# Patient Record
Sex: Female | Born: 1960
Health system: Southern US, Community
[De-identification: ages and names within clinical notes are randomized; demographics above are authoritative.]

## PROBLEM LIST (undated history)

## (undated) DIAGNOSIS — D509 Iron deficiency anemia, unspecified: Secondary | ICD-10-CM

## (undated) DIAGNOSIS — N200 Calculus of kidney: Secondary | ICD-10-CM

## (undated) DIAGNOSIS — G43909 Migraine, unspecified, not intractable, without status migrainosus: Secondary | ICD-10-CM

## (undated) DIAGNOSIS — G970 Cerebrospinal fluid leak from spinal puncture: Secondary | ICD-10-CM

## (undated) DIAGNOSIS — E039 Hypothyroidism, unspecified: Secondary | ICD-10-CM

## (undated) DIAGNOSIS — K2970 Gastritis, unspecified, without bleeding: Secondary | ICD-10-CM

## (undated) DIAGNOSIS — F329 Major depressive disorder, single episode, unspecified: Secondary | ICD-10-CM

## (undated) DIAGNOSIS — E538 Deficiency of other specified B group vitamins: Secondary | ICD-10-CM

## (undated) DIAGNOSIS — F32A Depression, unspecified: Secondary | ICD-10-CM

## (undated) DIAGNOSIS — M797 Fibromyalgia: Secondary | ICD-10-CM

## (undated) DIAGNOSIS — K2990 Gastroduodenitis, unspecified, without bleeding: Secondary | ICD-10-CM

## (undated) DIAGNOSIS — K219 Gastro-esophageal reflux disease without esophagitis: Secondary | ICD-10-CM

## (undated) HISTORY — DX: Gastroduodenitis, unspecified, without bleeding: K29.90

## (undated) HISTORY — DX: Gastro-esophageal reflux disease without esophagitis: K21.9

## (undated) HISTORY — DX: Cerebrospinal fluid leak from spinal puncture: G97.0

## (undated) HISTORY — DX: Iron deficiency anemia, unspecified: D50.9

## (undated) HISTORY — DX: Hypothyroidism, unspecified: E03.9

## (undated) HISTORY — DX: Migraine, unspecified, not intractable, without status migrainosus: G43.909

## (undated) HISTORY — DX: Deficiency of other specified B group vitamins: E53.8

## (undated) HISTORY — DX: Gastritis, unspecified, without bleeding: K29.70

---
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
## 1981-07-21 HISTORY — PX: THYROID SURGERY: SHX805

## 1987-07-22 HISTORY — PX: TUBAL LIGATION: SHX77

## 1997-07-21 DIAGNOSIS — M797 Fibromyalgia: Secondary | ICD-10-CM

## 1997-07-21 HISTORY — DX: Fibromyalgia: M79.7

## 1998-07-21 HISTORY — PX: TOTAL THYROIDECTOMY: SHX2547

## 1998-09-10 ENCOUNTER — Ambulatory Visit (HOSPITAL_COMMUNITY): Admission: RE | Admit: 1998-09-10 | Discharge: 1998-09-10 | Payer: Self-pay | Admitting: Hematology and Oncology

## 2006-07-30 ENCOUNTER — Ambulatory Visit: Payer: Self-pay | Admitting: Cardiology

## 2006-11-24 ENCOUNTER — Encounter: Admission: RE | Admit: 2006-11-24 | Discharge: 2006-11-24 | Payer: Self-pay | Admitting: Unknown Physician Specialty

## 2007-07-22 HISTORY — PX: CHOLECYSTECTOMY: SHX55

## 2007-07-22 LAB — HM MAMMOGRAPHY

## 2007-11-29 ENCOUNTER — Emergency Department (HOSPITAL_COMMUNITY): Admission: EM | Admit: 2007-11-29 | Discharge: 2007-11-30 | Payer: Self-pay | Admitting: Emergency Medicine

## 2008-07-21 LAB — CONVERTED CEMR LAB: Pap Smear: NEGATIVE

## 2009-05-12 ENCOUNTER — Encounter: Payer: Self-pay | Admitting: Gastroenterology

## 2009-06-12 ENCOUNTER — Encounter: Admission: RE | Admit: 2009-06-12 | Discharge: 2009-06-12 | Payer: Self-pay | Admitting: Specialist

## 2009-06-12 ENCOUNTER — Encounter: Payer: Self-pay | Admitting: Internal Medicine

## 2009-06-19 ENCOUNTER — Encounter: Payer: Self-pay | Admitting: Internal Medicine

## 2009-06-19 LAB — CONVERTED CEMR LAB
Basophils Relative: 0 %
Eosinophils Relative: 2 %
HCT: 33.7 %
Hemoglobin: 11.1 g/dL
Lymphocytes, automated: 40 %
MCV: 93 fL
Monocytes Relative: 14 %
Neutrophils Relative %: 44 %
RBC: 3.64 M/uL
RDW: 12.4 %
TSH: 4.23 microintl units/mL
WBC: 6.2 10*3/uL

## 2009-10-03 ENCOUNTER — Ambulatory Visit: Payer: Self-pay | Admitting: Internal Medicine

## 2009-10-03 DIAGNOSIS — G4726 Circadian rhythm sleep disorder, shift work type: Secondary | ICD-10-CM | POA: Insufficient documentation

## 2009-10-03 DIAGNOSIS — G43909 Migraine, unspecified, not intractable, without status migrainosus: Secondary | ICD-10-CM | POA: Insufficient documentation

## 2009-10-03 DIAGNOSIS — E039 Hypothyroidism, unspecified: Secondary | ICD-10-CM | POA: Insufficient documentation

## 2009-10-03 DIAGNOSIS — D649 Anemia, unspecified: Secondary | ICD-10-CM | POA: Insufficient documentation

## 2009-10-04 ENCOUNTER — Observation Stay (HOSPITAL_COMMUNITY): Admission: EM | Admit: 2009-10-04 | Discharge: 2009-10-07 | Payer: Self-pay | Admitting: Emergency Medicine

## 2009-10-04 ENCOUNTER — Encounter (INDEPENDENT_AMBULATORY_CARE_PROVIDER_SITE_OTHER): Payer: Self-pay | Admitting: *Deleted

## 2009-10-04 DIAGNOSIS — E538 Deficiency of other specified B group vitamins: Secondary | ICD-10-CM | POA: Insufficient documentation

## 2009-10-04 LAB — CONVERTED CEMR LAB
Basophils Absolute: 0.1 10*3/uL (ref 0.0–0.1)
Basophils Relative: 1 % (ref 0.0–3.0)
Cholesterol: 178 mg/dL (ref 0–200)
Eosinophils Absolute: 0.1 10*3/uL (ref 0.0–0.7)
Eosinophils Relative: 1.2 % (ref 0.0–5.0)
Ferritin: 45.6 ng/mL (ref 10.0–291.0)
Folate: 12.7 ng/mL
HCT: 37.6 % (ref 36.0–46.0)
HDL: 70.5 mg/dL (ref >39.00)
Hemoglobin: 12.4 g/dL (ref 12.0–15.0)
Iron: 49 ug/dL (ref 42–145)
LDL Cholesterol: 93 mg/dL (ref 0–99)
Lymphocytes Relative: 29.7 % (ref 12.0–46.0)
Lymphs Abs: 2.1 10*3/uL (ref 0.7–4.0)
MCHC: 32.9 g/dL (ref 30.0–36.0)
MCV: 94.2 fL (ref 78.0–100.0)
Monocytes Absolute: 0.8 10*3/uL (ref 0.1–1.0)
Monocytes Relative: 11.2 % (ref 3.0–12.0)
Neutro Abs: 4 10*3/uL (ref 1.4–7.7)
Neutrophils Relative %: 56.9 % (ref 43.0–77.0)
Platelets: 182 10*3/uL (ref 150.0–400.0)
RBC: 3.99 M/uL (ref 3.87–5.11)
RDW: 12.4 % (ref 11.5–14.6)
TSH: 2.09 microintl units/mL (ref 0.35–5.50)
Total CHOL/HDL Ratio: 3
Triglycerides: 71 mg/dL (ref 0.0–149.0)
VLDL: 14.2 mg/dL (ref 0.0–40.0)
Vitamin B-12: 209 pg/mL — ABNORMAL LOW (ref 211–911)
WBC: 7.1 10*3/uL (ref 4.5–10.5)

## 2009-10-05 LAB — CONVERTED CEMR LAB
ALT: 65 units/L
AST: 89 units/L
Albumin: 2.6 g/dL
Alkaline Phosphatase: 116 units/L
BUN: 5 mg/dL
Basophils Relative: 0 %
CO2: 20 meq/L
Chloride: 115 meq/L
Creatinine, Ser: 0.78 mg/dL
Eosinophils Relative: 0 %
Glucose, Bld: 93 mg/dL
HCT: 27.8 %
Hemoglobin: 9.8 g/dL
Lymphocytes, automated: 33 %
MCV: 89.5 fL
Monocytes Relative: 12 %
Neutrophils Relative %: 54 %
Platelets: 116 10*3/uL
Potassium: 3.3 meq/L
RBC: 3.1 M/uL
RDW: 13.4 %
Sodium: 137 meq/L
Total Bilirubin: 0.2 mg/dL
Total Protein: 5.4 g/dL
WBC: 3.5 10*3/uL

## 2009-10-06 LAB — CONVERTED CEMR LAB
HCT: 28.8 %
Hemoglobin: 10.1 g/dL
Lymphocytes, automated: 44 %
MCV: 89.1 fL
Monocytes Relative: 16 %
Neutrophils Relative %: 39 %
Platelets: 125 10*3/uL
RBC: 3.23 M/uL
RDW: 13.3 %
WBC: 3.8 10*3/uL

## 2009-10-07 LAB — CONVERTED CEMR LAB
BUN: 1 mg/dL
BUN: 4 mg/dL
Basophils Relative: 0 %
CO2: 21 meq/L
CO2: 23 meq/L
Calcium: 8.7 mg/dL
Calcium: 8.9 mg/dL
Chloride: 111 meq/L
Chloride: 116 meq/L
Creatinine, Ser: 0.72 mg/dL
Creatinine, Ser: 0.73 mg/dL
Eosinophils Relative: 2 %
Glucose, Bld: 103 mg/dL
Glucose, Bld: 95 mg/dL
HCT: 31.3 %
Hemoglobin: 10.9 g/dL
Lymphocytes, automated: 35 %
MCV: 89.2 fL
Monocytes Relative: 14 %
Neutrophils Relative %: 48 %
Platelets: 150 10*3/uL
Potassium: 3.7 meq/L
Potassium: 3.9 meq/L
RBC: 3.51 M/uL
RDW: 13.1 %
Sodium: 138 meq/L
Sodium: 140 meq/L
WBC: 5.2 10*3/uL

## 2009-10-09 ENCOUNTER — Encounter: Payer: Self-pay | Admitting: Internal Medicine

## 2009-10-10 ENCOUNTER — Ambulatory Visit: Payer: Self-pay | Admitting: Internal Medicine

## 2009-10-10 ENCOUNTER — Encounter (INDEPENDENT_AMBULATORY_CARE_PROVIDER_SITE_OTHER): Payer: Self-pay | Admitting: *Deleted

## 2009-10-10 DIAGNOSIS — K922 Gastrointestinal hemorrhage, unspecified: Secondary | ICD-10-CM | POA: Insufficient documentation

## 2009-10-10 DIAGNOSIS — A059 Bacterial foodborne intoxication, unspecified: Secondary | ICD-10-CM | POA: Insufficient documentation

## 2009-10-10 LAB — CONVERTED CEMR LAB: Hemoglobin: 13 g/dL (ref 12.0–15.0)

## 2009-10-23 LAB — HM COLONOSCOPY

## 2009-11-01 ENCOUNTER — Encounter (INDEPENDENT_AMBULATORY_CARE_PROVIDER_SITE_OTHER): Payer: Self-pay | Admitting: *Deleted

## 2009-11-01 ENCOUNTER — Ambulatory Visit: Payer: Self-pay | Admitting: Gastroenterology

## 2009-11-01 DIAGNOSIS — R197 Diarrhea, unspecified: Secondary | ICD-10-CM | POA: Insufficient documentation

## 2009-11-01 DIAGNOSIS — D509 Iron deficiency anemia, unspecified: Secondary | ICD-10-CM | POA: Insufficient documentation

## 2009-11-01 DIAGNOSIS — R630 Anorexia: Secondary | ICD-10-CM | POA: Insufficient documentation

## 2009-11-01 LAB — CONVERTED CEMR LAB
ALT: 17 units/L (ref 0–35)
AST: 22 units/L (ref 0–37)
Albumin: 4.5 g/dL (ref 3.5–5.2)
Alkaline Phosphatase: 131 units/L — ABNORMAL HIGH (ref 39–117)
BUN: 6 mg/dL (ref 6–23)
Basophils Absolute: 0 10*3/uL (ref 0.0–0.1)
Basophils Relative: 0.5 % (ref 0.0–3.0)
Bilirubin, Direct: 0.1 mg/dL (ref 0.0–0.3)
CO2: 27 meq/L (ref 19–32)
Calcium: 9.9 mg/dL (ref 8.4–10.5)
Chloride: 105 meq/L (ref 96–112)
Creatinine, Ser: 0.7 mg/dL (ref 0.4–1.2)
Eosinophils Absolute: 0.1 10*3/uL (ref 0.0–0.7)
Eosinophils Relative: 1.4 % (ref 0.0–5.0)
Ferritin: 59.2 ng/mL (ref 10.0–291.0)
Folate: 19.1 ng/mL
GFR calc non Af Amer: 95.1 mL/min (ref >60)
Glucose, Bld: 97 mg/dL (ref 70–99)
HCT: 34.5 % — ABNORMAL LOW (ref 36.0–46.0)
Hemoglobin: 12.1 g/dL (ref 12.0–15.0)
IgA: 311 mg/dL (ref 68–378)
Iron: 105 ug/dL (ref 42–145)
Lymphocytes Relative: 29.5 % (ref 12.0–46.0)
Lymphs Abs: 1.6 10*3/uL (ref 0.7–4.0)
MCHC: 34.9 g/dL (ref 30.0–36.0)
MCV: 90.2 fL (ref 78.0–100.0)
Monocytes Absolute: 0.7 10*3/uL (ref 0.1–1.0)
Monocytes Relative: 13.1 % — ABNORMAL HIGH (ref 3.0–12.0)
Neutro Abs: 3 10*3/uL (ref 1.4–7.7)
Neutrophils Relative %: 55.5 % (ref 43.0–77.0)
Platelets: 236 10*3/uL (ref 150.0–400.0)
Potassium: 4.1 meq/L (ref 3.5–5.1)
RBC: 3.83 M/uL — ABNORMAL LOW (ref 3.87–5.11)
RDW: 13 % (ref 11.5–14.6)
Saturation Ratios: 27.9 % (ref 20.0–50.0)
Sed Rate: 55 mm/hr — ABNORMAL HIGH (ref 0–22)
Sodium: 140 meq/L (ref 135–145)
TSH: 0.42 microintl units/mL (ref 0.35–5.50)
Total Bilirubin: 0.4 mg/dL (ref 0.3–1.2)
Total Protein: 8.7 g/dL — ABNORMAL HIGH (ref 6.0–8.3)
Transferrin: 269 mg/dL (ref 212.0–360.0)
Vitamin B-12: >1500 pg/mL — ABNORMAL HIGH (ref 211–911)
WBC: 5.4 10*3/uL (ref 4.5–10.5)

## 2009-11-06 ENCOUNTER — Ambulatory Visit: Payer: Self-pay | Admitting: Internal Medicine

## 2009-11-06 ENCOUNTER — Ambulatory Visit: Payer: Self-pay | Admitting: Gastroenterology

## 2009-11-06 DIAGNOSIS — J209 Acute bronchitis, unspecified: Secondary | ICD-10-CM | POA: Insufficient documentation

## 2009-11-07 ENCOUNTER — Telehealth: Payer: Self-pay | Admitting: Internal Medicine

## 2009-11-07 ENCOUNTER — Encounter (INDEPENDENT_AMBULATORY_CARE_PROVIDER_SITE_OTHER): Payer: Self-pay | Admitting: *Deleted

## 2009-11-07 LAB — CONVERTED CEMR LAB: Tissue Transglutaminase Ab, IgA: 9.8 units (ref <20)

## 2009-11-12 ENCOUNTER — Ambulatory Visit: Payer: Self-pay | Admitting: Gastroenterology

## 2009-11-12 ENCOUNTER — Telehealth (INDEPENDENT_AMBULATORY_CARE_PROVIDER_SITE_OTHER): Payer: Self-pay | Admitting: *Deleted

## 2009-11-12 DIAGNOSIS — K529 Noninfective gastroenteritis and colitis, unspecified: Secondary | ICD-10-CM | POA: Insufficient documentation

## 2009-11-12 DIAGNOSIS — K2970 Gastritis, unspecified, without bleeding: Secondary | ICD-10-CM | POA: Insufficient documentation

## 2009-11-12 DIAGNOSIS — K2990 Gastroduodenitis, unspecified, without bleeding: Secondary | ICD-10-CM

## 2009-11-12 HISTORY — DX: Gastritis, unspecified, without bleeding: K29.70

## 2009-11-12 LAB — CONVERTED CEMR LAB: UREASE: NEGATIVE

## 2009-11-15 ENCOUNTER — Ambulatory Visit: Payer: Self-pay | Admitting: Internal Medicine

## 2009-11-16 ENCOUNTER — Encounter: Payer: Self-pay | Admitting: Gastroenterology

## 2009-11-20 ENCOUNTER — Telehealth: Payer: Self-pay | Admitting: Gastroenterology

## 2010-01-12 ENCOUNTER — Ambulatory Visit: Payer: Self-pay | Admitting: Family Medicine

## 2010-01-14 ENCOUNTER — Telehealth: Payer: Self-pay | Admitting: Internal Medicine

## 2010-02-12 ENCOUNTER — Ambulatory Visit: Payer: Self-pay | Admitting: Internal Medicine

## 2010-02-12 LAB — CONVERTED CEMR LAB: TSH: 0.72 microintl units/mL (ref 0.35–5.50)

## 2010-04-13 ENCOUNTER — Ambulatory Visit: Payer: Self-pay | Admitting: Family Medicine

## 2010-04-13 DIAGNOSIS — R300 Dysuria: Secondary | ICD-10-CM | POA: Insufficient documentation

## 2010-04-13 LAB — CONVERTED CEMR LAB
Bilirubin Urine: NEGATIVE
Glucose, Urine, Semiquant: NEGATIVE
Ketones, urine, test strip: NEGATIVE
Nitrite: NEGATIVE
Protein, U semiquant: NEGATIVE
Specific Gravity, Urine: <1.005
Urobilinogen, UA: 0.2
pH: 6

## 2010-04-15 ENCOUNTER — Telehealth: Payer: Self-pay | Admitting: Internal Medicine

## 2010-06-10 ENCOUNTER — Telehealth: Payer: Self-pay | Admitting: Internal Medicine

## 2010-06-18 ENCOUNTER — Ambulatory Visit: Payer: Self-pay | Admitting: Internal Medicine

## 2010-06-19 LAB — CONVERTED CEMR LAB: TSH: 14.85 microintl units/mL — ABNORMAL HIGH (ref 0.35–5.50)

## 2010-07-22 ENCOUNTER — Ambulatory Visit: Payer: Self-pay | Admitting: Internal Medicine

## 2010-07-31 ENCOUNTER — Other Ambulatory Visit: Payer: Self-pay | Admitting: Internal Medicine

## 2010-07-31 LAB — TSH: TSH: 1.46 u[IU]/mL (ref 0.35–5.50)

## 2010-08-03 ENCOUNTER — Encounter: Payer: Self-pay | Admitting: Family Medicine

## 2010-08-05 ENCOUNTER — Encounter (INDEPENDENT_AMBULATORY_CARE_PROVIDER_SITE_OTHER): Payer: Self-pay | Admitting: *Deleted

## 2010-08-05 ENCOUNTER — Telehealth: Payer: Self-pay | Admitting: Internal Medicine

## 2010-08-12 ENCOUNTER — Encounter: Payer: Self-pay | Admitting: Internal Medicine

## 2010-08-20 NOTE — Procedures (Signed)
Summary: Upper Endoscopy  Patient: Melissa Kirby Note: All result statuses are Final unless otherwise noted.  Tests: (1) Upper Endoscopy (EGD)   EGD Upper Endoscopy       DONE     [facility]     [address]     [place], [place]  [address]           ENDOSCOPY PROCEDURE REPORT           PATIENT:  [name], [name]  MR#:  [id number]     BIRTHDATE:  May 04, 1961, 48 yrs. old  GENDER:  female           ENDOSCOPIST:  Vania Rea. Jarold Motto, MD, [facility]     Referred by:           PROCEDURE DATE:  11/12/2009     PROCEDURE:  EGD with biopsy     ASA CLASS:  Class II     INDICATIONS:  abdominal pain, diarrhea R/O CELIAC DISEASE.ABN     SEROLOGIES.           MEDICATIONS:   There was residual sedation effect present from     prior procedure., Versed 3 mg IV, glycopyrrolate (Robinal) 0.2 mg     IV     TOPICAL ANESTHETIC:           DESCRIPTION OF PROCEDURE:   After the risks benefits and     alternatives of the procedure were thoroughly explained, informed     consent was obtained.  The LB GIF-H180 [id number] endoscope was     introduced through the mouth and advanced to the second portion of     the duodenum, without limitations.  The instrument was slowly     withdrawn as the mucosa was fully examined.     <<PROCEDUREIMAGES>>           Normal duodenal folds were noted. SMALL BOWEL BIOPSIES DONE.     Moderate gastritis was found at the pylorus. EROSIVE GASTRITIS     NOTED AND DRIED HEME.CLO BX. DONE.  A hiatal hernia was found.     PROMINANT 4 CM. HH NOTED.NO ESOPHAGITIS.  The esophagus and     gastroesophageal junction were completely normal in appearance.     Retroflexed views revealed a hiatal hernia.    The scope was then     withdrawn from the patient and the procedure completed.           COMPLICATIONS:  None           ENDOSCOPIC IMPRESSION:     1) Normal duodenal folds     2) Moderate gastritis at the pylorus     3) Hiatal hernia     4) Normal esophagus     5) A hiatal hernia   1.R/O CELIAC DISEASE     2.R/O H.PYLORI     3.GASTRITIS FROM NSAID USE VS H.PYLORI.     RECOMMENDATIONS:     1) Await pathology results     2) Rx CLO if positive     3) continue current medications           REPEAT EXAM:  No           ______________________________     Vania Rea. Jarold Motto, MD, [facility]           CC:  Newt Lukes, MD           n.     Rosalie DoctorMarland Kitchen   Vania Rea. Patterson at 11/12/2009  04:03 PM           Suezanne Jacquet, [id number]  Note: An exclamation mark (!) indicates a result that was not dispersed into the flowsheet. Document Creation Date: 11/12/2009 4:04 PM _______________________________________________________________________  (1) Order result status: Final Collection or observation date-time: 11/12/2009 15:56 Requested date-time:  Receipt date-time:  Reported date-time:  Referring Physician:   Ordering Physician: Sheryn Bison [phone]) Specimen Source:  Source: Launa Grill Order Number: [id number] Lab site:

## 2010-08-20 NOTE — Assessment & Plan Note (Signed)
Summary: 6-12 [facility] FU--STC   Vital Signs:  Patient profile:   50 year old female Height:      62.5 inches (158.75 cm) Weight:      120.8 pounds (54.91 kg) O2 Sat:      97 % on Room air Temp:     98.2 degrees F (36.78 degrees C) oral Pulse rate:   88 / minute BP sitting:   100 / 68  (left arm) Cuff size:   regular  Vitals Entered By: Orlan Leavens (November 15, 2009 8:32 AM)  O2 Flow:  Room air CC: 6 week follow-up/ want to discuss dexilant rx Is Patient Diabetic? No Pain Assessment Patient in pain? no        Primary Care Provider:  Newt Lukes MD  CC:  6 week follow-up/ want to discuss dexilant rx.  History of Present Illness:  1) hypothyroid - follows with local endo for same -complicated hx reviewed. reports compliance with ongoing medical treatment and last changes in medication dose approx 2 mos ago. denies adverse side effects related to current therapy but doesn't feel right. hx weight loss, no skin changes  2) headaches/migraines - follows with neuro for same. has been on topamax over 12mos without sig change in symptoms. ha worst with stress and when " thyroid is off"  3) insomina. worse with shift work schedule, better with new med for migraine (pamelor).  4) anemia, hx iron defic, also found to have B12 defic - eval ongoing by GI, EGD/colo earlier this week 4/25 - probable celiac dz - has not yet begun diet changes  5) GERD - on dexilant samples, ? if more affordable options - ?still take H2B  Current Medications (verified): 1)  Synthroid 150 Mcg Tabs (Levothyroxine Sodium) .... Take 1 By Mouth Qd 2)  Nortriptyline Hcl 25 Mg Caps (Nortriptyline Hcl) .... Take 1 At Bedtime 3)  Topamax 50 Mg Tabs (Topiramate) .... Take 2 At Bedtime 4)  Ranitidine Hcl 150 Mg Tabs (Ranitidine Hcl) .... Take 1 Two Times A Day 5)  Foltx 2.5-25-2 Mg Tabs (Fa-Pyridoxine-Cyancobalamin) .Marland Kitchen.. 1 By Mouth Once Daily 6)  Nascobal 500 Mcg/0.6ml Soln (Cyanocobalamin) .... One Spray Weekly 7)   Tussionex Pennkinetic Er 8-10 Mg/43ml Lqcr (Chlorpheniramine-Hydrocodone) .... 5cc By Mouth Every 12hours As Needed For Cough  Allergies (verified): No Known Drug Allergies  Past History:  Past Medical History: anemia , iron defic & B12 defic migraines GERD hypothyroid, post surgical    Md rooster: neuro - Reynolds/freeman @ [facility] endo -[facility] - [facility] -[facility]terson  Review of Systems  The patient denies anorexia, weight gain, abdominal pain, hematochezia, and severe indigestion/heartburn.    Physical Exam  General:  alert, well-developed, well-nourished, and cooperative to examination.   Lungs:  normal respiratory effort, no intercostal retractions or use of accessory muscles; normal breath sounds bilaterally - no crackles and no wheezes.    Heart:  normal rate, regular rhythm, no murmur, and no rub. BLE without edema.  Abdomen:  soft, non-tender, normal bowel sounds, no distention; no masses and no appreciable hepatomegaly or splenomegaly.     Impression & Recommendations:  Problem # 1:  GASTRITIS (ICD-535.50)  pt may take either from my perspective depending on her costs -  to f/u with GI as planned, EGD reviewed Her updated medication list for this problem includes:    Ranitidine Hcl 150 Mg Tabs (Ranitidine hcl) .Marland Kitchen... Take 1 two times a day    Pantoprazole Sodium 20 Mg  Tbec (Pantoprazole sodium) .Marland Kitchen... 1 by mouth once daily  Discussed use of medication, as well as lifestyle changes.   Problem # 2:  VITAMIN B12 DEFICIENCY (ICD-266.2) replacment ongoing  Problem # 3:  ANEMIA-NOS (ICD-285.9)  multifactorial - ?celiac dz - per GI cont supplements as ongoing Her updated medication list for this problem includes:    Nascobal 500 Mcg/0.81ml Soln (Cyanocobalamin) ..... One spray weekly  Hgb: 12.1 (11/01/2009)   Hct: 34.5 (11/01/2009)   Platelets: 236.0 (11/01/2009) RBC: 3.83 (11/01/2009)   RDW: 13.0 (11/01/2009)   WBC: 5.4 (11/01/2009) MCV: 90.2  (11/01/2009)   MCHC: 34.9 (11/01/2009) Ferritin: 59.2 (11/01/2009) Iron: 105 (11/01/2009)   % Sat: 27.9 (11/01/2009) B12: >1500 pg/mL (11/01/2009)   Folate: 19.1 (11/01/2009)   TSH: 0.42 (11/01/2009)  Problem # 4:  UNSPECIFIED HYPOTHYROIDISM (ICD-244.9)  Her updated medication list for this problem includes:    Synthroid 150 Mcg Tabs (Levothyroxine sodium) .Marland Kitchen... Take 1 by mouth qd  Labs Reviewed: TSH: 0.42 (11/01/2009)    Chol: 178 (10/03/2009)   HDL: 70.50 (10/03/2009)   LDL: 93 (10/03/2009)   TG: 71.0 (10/03/2009)  Problem # 5:  MIGRAINE HEADACHE (ICD-346.90)  Continue her migraine medications currently.  Complete Medication List: 1)  Synthroid 150 Mcg Tabs (Levothyroxine sodium) .... Take 1 by mouth qd 2)  Nortriptyline Hcl 25 Mg Caps (Nortriptyline hcl) .... Take 1 at bedtime 3)  Topamax 50 Mg Tabs (Topiramate) .... Take 2 at bedtime 4)  Foltx 2.5-25-2 Mg Tabs (Fa-pyridoxine-cyancobalamin) .Marland Kitchen.. 1 by mouth once daily 5)  Nascobal 500 Mcg/0.4ml Soln (Cyanocobalamin) .... One spray weekly 6)  Tussionex Pennkinetic Er 8-10 Mg/67ml Lqcr (Chlorpheniramine-hydrocodone) .... 5cc by mouth every 12hours as needed for cough 7)  Ranitidine Hcl 150 Mg Tabs (Ranitidine hcl) .... Take 1 two times a day 8)  Pantoprazole Sodium 20 Mg Tbec (Pantoprazole sodium) .Marland Kitchen.. 1 by mouth once daily  Patient Instructions: 1)  it was good to see you today.  2)  prescription for generic protonix (similar to dexilant) provided to check with you pharmacy about cost - you may take this OR ranitidine as discussed (unless otherwise instructed by dr. Jarold Motto!) 3)  no other medicine changes - 4)  Please schedule a follow-up appointment in 3 months to recheck labs as needed, call sooner if problems.  Prescriptions: PANTOPRAZOLE SODIUM 20 MG TBEC (PANTOPRAZOLE SODIUM) 1 by mouth once daily  #30 x 3   Entered and Authorized by:   Newt Lukes MD   Signed by:   Newt Lukes MD on 11/15/2009   Method used:    Print then Give to Patient   RxID:   [id number]

## 2010-08-20 NOTE — Assessment & Plan Note (Signed)
Summary: POST HOSP/FOOD POISONING/LB   Vital Signs:  Patient profile:   50 year old female Height:      62.5 inches (158.75 cm) Weight:      119.4 pounds (54.27 kg) O2 Sat:      98 % on Room air Temp:     98.1 degrees F (36.72 degrees C) oral Pulse rate:   128 / minute BP sitting:   92 / 62  (left arm) Cuff size:   regular  Vitals Entered By: Orlan Leavens (October 10, 2009 11:10 AM)  O2 Flow:  Room air CC: Hosp f/u. Pt want to discuss Vitamin B12 before starting Is Patient Diabetic? No Pain Assessment Patient in pain? no        Primary Care Provider:  Newt Lukes MD  CC:  Hosp f/u. Pt want to discuss Vitamin B12 before starting.  History of Present Illness: here for hosp f/u at [facility] 3/17-3/21 for gastroenteristis - a/w dehydration, G+ sttol and mild anemia - since home, tol by mouth, no pain, no signs bleeding - no dizzy, no N/V/D - still on liquid diet  B12 defic - had shot of B12 prior to hosp dc - GI appt in April as sched  Clinical Review Panels:  CBC   WBC:  5.2 (10/07/2009)   RBC:  3.51 (10/07/2009)   Hgb:  10.9 (10/07/2009)   Hct:  31.3 (10/07/2009)   Platelets:  150 (10/07/2009)   MCV  89.2 (10/07/2009)   MCHC  32.9 (10/03/2009)   RDW  13.1 (10/07/2009)   PMN:  48 (10/07/2009)   Lymphs:  29.7 (10/03/2009)   Monos:  14 (10/07/2009)   Eosinophils:  2 (10/07/2009)   Basophil:  0 (10/07/2009)  Complete Metabolic Panel   Glucose:  95 (10/07/2009)   Sodium:  140 (10/07/2009)   Potassium:  3.7 (10/07/2009)   Chloride:  116 (10/07/2009)   CO2:  21 (10/07/2009)   BUN:  1 (10/07/2009)   Creatinine:  0.72 (10/07/2009)   Albumin:  2.6 (10/05/2009)   Total Protein:  5.4 (10/05/2009)   Calcium:  8.7 (10/07/2009)   Total Bili:  0.2 (10/05/2009)   Alk Phos:  116 (10/05/2009)   SGPT (ALT):  65 (10/05/2009)   SGOT (AST):  89 (10/05/2009)   -  Date:  10/07/2009    WBC: 5.2    HGB: 10.9    HCT: 31.3    RBC: 3.51    PLT: 150    MCV: 89.2  RDW: 13.1    Neutrophil: 48    Lymphs: 35    Monos: 14    Eos: 2    Basophil: 0    BG Random: 95    BG Random: 103    BUN: 1    BUN: 4    Creatinine: 0.72    Creatinine: 0.73    Sodium: 140    Sodium: 138    Potassium: 3.7    Potassium: 3.9    Chloride: 116    Chloride: 111    CO2 Total: 21    CO2 Total: 23    Calcium: 8.7    Calcium: 8.9  Date:  10/06/2009    WBC: 3.8    HGB: 10.1    HCT: 28.8    RBC: 3.23    PLT: 125    MCV: 89.1    RDW: 13.3    Neutrophil: 39    Lymphs: 44    Monos: 16  Date:  10/05/2009    WBC: 3.5  HGB: 9.8    HCT: 27.8    RBC: 3.10    PLT: 116    MCV: 89.5    RDW: 13.4    Neutrophil: 54    Lymphs: 33    Monos: 12    Eos: 0    Basophil: 0    BG Random: 93    BUN: 5    Creatinine: 0.78    Sodium: 137    Potassium: 3.3    Chloride: 115    CO2 Total: 20    SGOT (AST): 89    SGPT (ALT): 65    T. Bilirubin: 0.2    Alk Phos: 116    Total Protein: 5.4    Albumin: 2.6  Current Medications (verified): 1)  Synthroid 150 Mcg Tabs (Levothyroxine Sodium) .... Take 1 By Mouth Qd 2)  Nortriptyline Hcl 25 Mg Caps (Nortriptyline Hcl) .... Take 1 At Bedtime 3)  Topamax 50 Mg Tabs (Topiramate) .... Take 2 At Bedtime 4)  Ranitidine Hcl 150 Mg Tabs (Ranitidine Hcl) .... Take 1 Two Times A Day 5)  Promethazine Hcl 25 Mg Tabs (Promethazine Hcl) .... Take 1 Qid As Needed 6)  Vitamin D 400 Unit Tabs (Cholecalciferol) .... Take 2 By Mouth Qd 7)  Clotrimazole-Betamethasone 1-0.05 % Crea (Clotrimazole-Betamethasone) .... Use Three Times A Day As Needed  Allergies (verified): No Known Drug Allergies  Past History:  Past Medical History: anemia hx, iron defic migraines hypothyroid, post surgical    Md rooster: neuro - Reynolds/freeman @ [facility] endo -[facility] g[facility] - [facility]  Review of Systems  The patient denies fever, hoarseness, syncope, dyspnea on exertion, peripheral edema, prolonged cough, headaches, abdominal pain, melena,  hematochezia, and severe indigestion/heartburn.    Physical Exam  General:  thin, alert, well-developed, well-nourished, and cooperative to examination.   spouse at side Lungs:  normal respiratory effort, no intercostal retractions or use of accessory muscles; normal breath sounds bilaterally - no crackles and no wheezes.    Heart:  tachycardia but regular rhythm, no murmur, and no rub. BLE without edema Abdomen:  soft, non-tender, normal bowel sounds, no distention; no masses and no appreciable hepatomegaly or splenomegaly.     Impression & Recommendations:  Problem # 1:  FOOD POISONING (ICD-005.9) Assessment Improved hosp dc summary, labs and imaging reviewed - ok to return to work - [employer] completed and faxed including return to work letter - given to pt Time spent with patient 30 minutes, more than 50% of this time was spent completeing paperwork, reviewing hosp course and discussing plans for eval of anemia and B12 defic  Problem # 2:  GI BLEEDING (ICD-578.9) acute drop Hgb during hosp - rechek now - no clinical signs of ongoing GIB - suspect gastritis/esophagitis from acute GI illness - cont H2b two times a day   Problem # 3:  ANEMIA-NOS (ICD-285.9)  rechek Hgb now to ensure stable -  HD soft but asymptomatic to be seen bu GI, appt pending for eval of this and B12 issues Hgb: 10.9 (10/07/2009)   Hct: 31.3 (10/07/2009)   Platelets: 150 (10/07/2009) RBC: 3.51 (10/07/2009)   RDW: 13.1 (10/07/2009)   WBC: 5.2 (10/07/2009) MCV: 89.2 (10/07/2009)   MCHC: 32.9 (10/03/2009) Ferritin: 45.6 (10/03/2009) Iron: 49 (10/03/2009)   B12: 209 (10/03/2009)   Folate: 12.7 (10/03/2009)   TSH: 2.09 (10/03/2009)  Orders: TLB-Hemoglobin (Hgb) ([id number])  Problem # 4:  VITAMIN B12 DEFICIENCY (ICD-266.2)  B12 shot given 3/21 in hosp - to see GI re: ?malabs -  iron ok start foltx pending GI eval - e-r done  Orders: Prescription Created Electronically [phone])  Complete Medication  List: 1)  Synthroid 150 Mcg Tabs (Levothyroxine sodium) .... Take 1 by mouth qd 2)  Nortriptyline Hcl 25 Mg Caps (Nortriptyline hcl) .... Take 1 at bedtime 3)  Topamax 50 Mg Tabs (Topiramate) .... Take 2 at bedtime 4)  Ranitidine Hcl 150 Mg Tabs (Ranitidine hcl) .... Take 1 two times a day 5)  Promethazine Hcl 25 Mg Tabs (Promethazine hcl) .... Take 1 qid as needed 6)  Vitamin D 400 Unit Tabs (Cholecalciferol) .... Take 2 by mouth qd 7)  Clotrimazole-betamethasone 1-0.05 % Crea (Clotrimazole-betamethasone) .... Use three times a day as needed 8)  Foltx 2.5-25-2 Mg Tabs (Fa-pyridoxine-cyancobalamin) .Marland Kitchen.. 1 by mouth once daily  Patient Instructions: 1)  it was good to see you today. 2)  recheck labs (anemia) today to make sure this value is not dropping anymore - 3)  keep well hydrated! advance diet slowly as tolerated 4)  try foltx vitamin - prescription sent to your pharmacy - 5)  keep followup with GI (dr. Jarold Motto) as scheduled 6)  ok to return to work as discussed - forms faxed to [facility] as discussed 7)  Please keep follow-up appointment as previously scheduled, sooner if problems.  Prescriptions: FOLTX 2.5-25-2 MG TABS (FA-PYRIDOXINE-CYANCOBALAMIN) 1 by mouth once daily  #30 x 3   Entered and Authorized by:   Newt Lukes MD   Signed by:   Newt Lukes MD on 10/10/2009   Method used:   Electronically to        [facility]  [id number] (retail)       [address]       [place], [place]  [address]       Ph: [phone] or [phone]       Fax: [phone]   RxID:   [id number]

## 2010-08-20 NOTE — Assessment & Plan Note (Signed)
Summary: 3 mth fu   stc  RS'D PER PT/NWS   Vital Signs:  Patient profile:   50 year old female Height:      62.5 inches (158.75 cm) Weight:      119.0 pounds (54.09 kg) O2 Sat:      95 % on Room air Temp:     99.3 degrees F (37.39 degrees C) oral Pulse rate:   105 / minute BP sitting:   102 / 70  (left arm) Cuff size:   regular  Vitals Entered By: Orlan Leavens RMA (February 12, 2010 1:07 PM)  O2 Flow:  Room air CC: 3 month follow-up Is Patient Diabetic? No Pain Assessment Patient in pain? no      Comments Pt also complaining of cough, chest & nasal congestion. Low grade fever x's 2 days   Primary Care Provider:  Newt Lukes MD  CC:  3 month follow-up.  History of Present Illness:   1) hypothyroid - follows with local endo for same -complicated hx reviewed. reports compliance with ongoing medical treatment and last changes in medication dose approx 2 mos ago. denies adverse side effects related to current therapy but doesn't feel right. hx weight loss, no skin changes  2) headaches/migraines - follows with neuro for same. has been on topamax over 12mos without sig change in symptoms. ha worst with stress and when " thyroid is off"  3) insomina. worse with shift work schedule, better with new med for migraine (pamelor).  4) anemia, hx iron defic, also found to have B12 defic - eval ongoing by GI, EGD/colo earlier this week 4/25 - probable celiac dz - has not yet begun diet changes  5) GERD - reports compliance with ongoing medical treatment and no changes in medication dose or frequency. denies adverse side effects related to current therapy.   also c/o chest cold - a/w LGF, dry cough - onset last 24h - +sick contacts - spouse ill with bronchitis last 10d  Preventive Screening-Counseling & Management  Alcohol-Tobacco     Alcohol drinks/day: 0     Smoking Status: never     Tobacco Counseling: not indicated; no tobacco use  Clinical Review Panels:  Lipid  Management   Cholesterol:  178 (10/03/2009)   LDL (bad choesterol):  93 (10/03/2009)   HDL (good cholesterol):  70.50 (10/03/2009)  CBC   WBC:  5.4 (11/01/2009)   RBC:  3.83 (11/01/2009)   Hgb:  12.1 (11/01/2009)   Hct:  34.5 (11/01/2009)   Platelets:  236.0 (11/01/2009)   MCV  90.2 (11/01/2009)   MCHC  34.9 (11/01/2009)   RDW  13.0 (11/01/2009)   PMN:  55.5 (11/01/2009)   Lymphs:  29.5 (11/01/2009)   Monos:  13.1 (11/01/2009)   Eosinophils:  1.4 (11/01/2009)   Basophil:  0.5 (11/01/2009)  Complete Metabolic Panel   Glucose:  97 (11/01/2009)   Sodium:  140 (11/01/2009)   Potassium:  4.1 (11/01/2009)   Chloride:  105 (11/01/2009)   CO2:  27 (11/01/2009)   BUN:  6 (11/01/2009)   Creatinine:  0.7 (11/01/2009)   Albumin:  4.5 (11/01/2009)   Total Protein:  8.7 (11/01/2009)   Calcium:  9.9 (11/01/2009)   Total Bili:  0.4 (11/01/2009)   Alk Phos:  131 (11/01/2009)   SGPT (ALT):  17 (11/01/2009)   SGOT (AST):  22 (11/01/2009)   Current Medications (verified): 1)  Synthroid 150 Mcg Tabs (Levothyroxine Sodium) .... Take 1 By Mouth Qd 2)  Nortriptyline Hcl 25  Mg Caps (Nortriptyline Hcl) .... Take 1 At Bedtime 3)  Topamax 50 Mg Tabs (Topiramate) .... Take 2 At Bedtime 4)  Foltx 2.5-25-2 Mg Tabs (Fa-Pyridoxine-Cyancobalamin) .Marland Kitchen.. 1 By Mouth Once Daily 5)  Ranitidine Hcl 150 Mg Tabs (Ranitidine Hcl) .... Take 1 Two Times A Day 6)  Pantoprazole Sodium 20 Mg Tbec (Pantoprazole Sodium) .Marland Kitchen.. 1 By Mouth Once Daily 7)  Cyanocobalamin 1000 Mcg/ml Soln (Cyanocobalamin) .Marland Kitchen.. 1 Injection Q Month  Allergies (verified): No Known Drug Allergies  Past History:  Past Medical History: anemia , iron defic & B12 defic migraines GERD hypothyroid, post surgical    Md roster: neuro - Reynolds/freeman @ [facility] endo -[facility] - [facility] -[facility]  Review of Systems  The patient denies weight loss, chest pain, syncope, and abdominal pain.    Physical Exam  General:   alert, well-developed, well-nourished, and cooperative to examination.   nontoxic but slightly hoarse Lungs:  normal respiratory effort, no intercostal retractions or use of accessory muscles; few scattered rattle-like breath sounds bilaterally - no crackles and no wheezes.    Heart:  normal rate, regular rhythm, no murmur, and no rub. BLE without edema.   Impression & Recommendations:  Problem # 1:  ACUTE BRONCHITIS (ICD-466.0)  Her updated medication list for this problem includes:    Azithromycin 250 Mg Tabs (Azithromycin) .Marland Kitchen... 2 tabs by mouth today, then 1 by mouth daily starting tomorrow  Take antibiotics and other medications as directed. Encouraged to push clear liquids, get enough rest, and take acetaminophen as needed. To be seen in 5-7 days if no improvement, sooner if worse.  Problem # 2:  UNSPECIFIED HYPOTHYROIDISM (ICD-244.9)  Her updated medication list for this problem includes:    Synthroid 150 Mcg Tabs (Levothyroxine sodium) .Marland Kitchen... Take 1 by mouth qd  Orders: TLB-TSH (Thyroid Stimulating Hormone) (84443-TSH)  Labs Reviewed: TSH: 0.42 (11/01/2009)    Chol: 178 (10/03/2009)   HDL: 70.50 (10/03/2009)   LDL: 93 (10/03/2009)   TG: 71.0 (10/03/2009)  Problem # 3:  VITAMIN B12 DEFICIENCY (ICD-266.2)  replacment ongoing  Orders: Vit B12 1000 mcg (J3420) Admin of Therapeutic Inj  intramuscular or subcutaneous (16109)  Problem # 4:  MIGRAINE HEADACHE (ICD-346.90)  [facility]- ?stop or reduce topamax and pamelor as pt believes not effectove -- to d/w them  Orders: TLB-TSH (Thyroid Stimulating Hormone) (84443-TSH)  Complete Medication List: 1)  Synthroid 150 Mcg Tabs (Levothyroxine sodium) .... Take 1 by mouth qd 2)  Nortriptyline Hcl 25 Mg Caps (Nortriptyline hcl) .... Take 1 at bedtime 3)  Topamax 50 Mg Tabs (Topiramate) .... Take 2 at bedtime 4)  Foltx 2.5-25-2 Mg Tabs (Fa-pyridoxine-cyancobalamin) .Marland Kitchen.. 1 by mouth once daily 5)   Ranitidine Hcl 150 Mg Tabs (Ranitidine hcl) .... Take 1 two times a day 6)  Pantoprazole Sodium 20 Mg Tbec (Pantoprazole sodium) .Marland Kitchen.. 1 by mouth once daily 7)  Cyanocobalamin 1000 Mcg/ml Soln (Cyanocobalamin) .Marland Kitchen.. 1 injection q month 8)  Azithromycin 250 Mg Tabs (Azithromycin) .... 2 tabs by mouth today, then 1 by mouth daily starting tomorrow  Patient Instructions: 1)  it was good to see you today. 2)  test(s) ordered today - your results will be posted on the phone tree for review in 48-72 hours from the time of test completion; call [phone] and enter your 9 digit MRN (listed above on this page, just below your name); if any changes need to be made or there are abnormal results, you  will be contacted directly. 3)  B12 shot today - done 4)  Zpoack for your bronchits and chest symptoms - your prescription has been electronically submitted to your pharmacy. Please take as directed. Contact our office if you believe you're having problems with the medication(s).  Also use tussin or mucinex for cough as needed  5)  Get plenty of rest, drink lots of clear liquids, and use Tylenol or Ibuprofen for fever and comfort. Return in 7-10 days if you're not better:sooner if you're feeling worse. 6)  talk with the headache clinic about reducing or stopping the topamax or nortriptyline as discussed 7)  otherwise, Please schedule a follow-up appointment in 4 months, sooner if problems.  Prescriptions: AZITHROMYCIN 250 MG TABS (AZITHROMYCIN) 2 tabs by mouth today, then 1 by mouth daily starting tomorrow  #6 x 0   Entered and Authorized by:   Newt Lukes MD   Signed by:   Newt Lukes MD on 02/12/2010   Method used:   Electronically to        [facility]  [address] [address] [id number] (retail)       [address]       [place], [place]  [address]       Ph: [phone] or [phone]       Fax: [phone]   RxID:   [id number]    Medication Administration  Injection # 1:     Medication: Vit B12 1000 mcg    Diagnosis: VITAMIN B12 DEFICIENCY (ICD-266.2)    Route: IM    Site: L deltoid    Exp Date: 10/2011    Lot #: [id number]    Mfr: American Regent    Patient tolerated injection without complications    Given by: Orlan Leavens RMA (February 12, 2010 1:40 PM)  Orders Added: 1)  TLB-TSH (Thyroid Stimulating Hormone) [84443-TSH] 2)  Est. Patient Level IV [64403] 3)  Vit B12 1000 mcg [J3420] 4)  Admin of Therapeutic Inj  intramuscular or subcutaneous [47425]

## 2010-08-20 NOTE — Assessment & Plan Note (Signed)
Summary: Last of 3 weekly b 12/dfs  Nurse Visit  Medication Administration  Injection # 1:    Medication: Vit B12 1000 mcg    Diagnosis: VITAMIN B12 DEFICIENCY (ICD-266.2)    Route: IM    Site: L deltoid    Exp Date: 08/2011    Lot #: [id number]    Mfr: American Regent    Comments: pt will begin nascabol next Tuesday.    Patient tolerated injection without complications    Given by: Francee Piccolo CMA Duncan Dull) (November 06, 2009 11:21 AM)  Orders Added: 1)  Vit B12 1000 mcg [J3420]

## 2010-08-20 NOTE — Progress Notes (Signed)
Summary: Call Report  Phone Note Other Incoming   Caller: [facility] Call Report Summary of Call: [facility] Triage Call Report Triage Record Num: [id number] Operator: Caswell Corwin Patient Name: Melissa Kirby Call Date & Time: 01/12/2010 9:21:49AM Patient Phone: [phone] PCP: Rene Paci Patient Gender: Female PCP Fax : [phone] Patient DOB: 06-21-1961 Practice Name: [facility] Reason for Call: Pt calling that she has a bad H/a that is not her usual migraine. Started 01/11/10. Also having chills and body aches, but afebile. Started betweeen her eyes and throbbing. Traiged headache and R temple and R side fo face is sore. Transferred to office for appt this AM. Protocol(s) Used: Headache Recommended Outcome per Protocol: See ED Immediately Reason for Outcome: New tenderness over temporal area Care Advice:  ~ Another adult should drive. 06/ Initial call taken by: Margaret Pyle, CMA,  January 14, 2010 8:08 AM  Follow-up for Phone Call        noted - see OV 6/25 in sat clinic for tx of same+ Follow-up by: Newt Lukes MD,  January 14, 2010 8:12 AM

## 2010-08-20 NOTE — Assessment & Plan Note (Signed)
Summary: cough,cold/cd   Vital Signs:  Patient profile:   50 year old female Height:      62.5 inches (158.75 cm) Weight:      123.0 pounds (55.91 kg) O2 Sat:      99 % on Room air Temp:     97.5 degrees F (36.39 degrees C) oral Pulse rate:   99 / minute BP sitting:   101 / 78  (left arm) Cuff size:   regular  Vitals Entered By: Orlan Leavens (November 06, 2009 11:27 AM)  O2 Flow:  Room air CC: cold sxs x's 4 days, URI symptoms Is Patient Diabetic? No Pain Assessment Patient in pain? no        Primary Care Provider:  Newt Lukes MD  CC:  cold sxs x's 4 days and URI symptoms.  History of Present Illness:  URI Symptoms      This is a 50 year old woman who presents with URI symptoms.  The symptoms began 4 days ago.  The severity is described as moderate.  The patient reports nasal congestion, purulent nasal discharge, sore throat, and productive cough, but denies sick contacts.  Associated symptoms include low-grade fever (<100.5 degrees), continued diarrhea, and use of an antipyretic.  The patient denies dyspnea, wheezing, and vomiting.  The patient also reports severe fatigue.  The patient denies itchy throat, sneezing, seasonal symptoms, and headache.  The patient denies the following risk factors for Strep sinusitis: tooth pain and Strep exposure.  Using OTC meds including Dayquil, Nyquil and Hall's lozenges without relief.  Current Medications (verified): 1)  Synthroid 150 Mcg Tabs (Levothyroxine Sodium) .... Take 1 By Mouth Qd 2)  Nortriptyline Hcl 25 Mg Caps (Nortriptyline Hcl) .... Take 1 At Bedtime 3)  Topamax 50 Mg Tabs (Topiramate) .... Take 2 At Bedtime 4)  Ranitidine Hcl 150 Mg Tabs (Ranitidine Hcl) .... Take 1 Two Times A Day 5)  Foltx 2.5-25-2 Mg Tabs (Fa-Pyridoxine-Cyancobalamin) .Marland Kitchen.. 1 By Mouth Once Daily 6)  Moviprep 100 Gm  Solr (Peg-Kcl-Nacl-Nasulf-Na Asc-C) .... As Per Prep Instructions. 7)  Nascobal 500 Mcg/0.69ml Soln (Cyanocobalamin) .... One Spray  Weekly  Allergies (verified): No Known Drug Allergies  Past History:  Past Medical History: anemia , iron defic & B12 defic migraines hypothyroid, post surgical    Md rooster: neuro - Reynolds/freeman @ [facility] endo -[facility] g[facility] - [facility] -[facility]  Review of Systems  The patient denies anorexia, weight loss, vision loss, chest pain, hemoptysis, and abdominal pain.    Physical Exam  General:  alert, well-developed, well-nourished, and cooperative to examination.   mod ill appearing Eyes:  vision grossly intact; pupils equal, round and reactive to light.  conjunctiva and lids normal.   no icterus Ears:  normal pinnae bilaterally, without erythema, swelling, or tenderness to palpation. TMs clear, without effusion, or cerumen impaction. Hearing grossly normal bilaterally  Mouth:  teeth and gums in good repair; mucous membranes moist, without lesions or ulcers. oropharynx clear without exudate, mod erythema. +PND Lungs:  bilateral rhonchi, no crackles - good air mvmt bilaterally Heart:  tachycardia but regular rhythm, no murmur, and no rub. BLE without edema Neurologic:  alert & oriented X3 and cranial nerves II-XII symetrically intact.  strength normal in all extremities, sensation intact to light touch, and gait normal. speech fluent without dysarthria or aphasia; follows commands with good comprehension.  Skin:  pale, no rashes, vesicles, ulcers, or erythema. No nodules or irregularity to palpation.  Psych:  Oriented X3,  memory intact for recent and remote, normally interactive, good eye contact, not anxious appearing, not depressed appearing, and not agitated.      Impression & Recommendations:  Problem # 1:  ACUTE BRONCHITIS (ICD-466.0)  Her updated medication list for this problem includes:    Azithromycin 250 Mg Tabs (Azithromycin) .Marland Kitchen... 2 tabs by mouth today, then 1 by mouth daily starting tomorrow    Tussionex Pennkinetic Er 8-10 Mg/72ml Lqcr  (Chlorpheniramine-hydrocodone) .Marland Kitchen... 5cc by mouth every 12hours as needed for cough  Take antibiotics and other medications as directed. Encouraged to push clear liquids, get enough rest, and take acetaminophen as needed. To be seen in 5-7 days if no improvement, sooner if worse.  Problem # 2:  DIARRHEA (ICD-787.91) eval for celiac sprue ongoing by GI - for colo next week - use probiotic to try and prevent exac with abx use as above  Complete Medication List: 1)  Synthroid 150 Mcg Tabs (Levothyroxine sodium) .... Take 1 by mouth qd 2)  Nortriptyline Hcl 25 Mg Caps (Nortriptyline hcl) .... Take 1 at bedtime 3)  Topamax 50 Mg Tabs (Topiramate) .... Take 2 at bedtime 4)  Ranitidine Hcl 150 Mg Tabs (Ranitidine hcl) .... Take 1 two times a day 5)  Foltx 2.5-25-2 Mg Tabs (Fa-pyridoxine-cyancobalamin) .Marland Kitchen.. 1 by mouth once daily 6)  Moviprep 100 Gm Solr (Peg-kcl-nacl-nasulf-na asc-c) .... As per prep instructions. 7)  Nascobal 500 Mcg/0.53ml Soln (Cyanocobalamin) .... One spray weekly 8)  Azithromycin 250 Mg Tabs (Azithromycin) .... 2 tabs by mouth today, then 1 by mouth daily starting tomorrow 9)  Tussionex Pennkinetic Er 8-10 Mg/44ml Lqcr (Chlorpheniramine-hydrocodone) .... 5cc by mouth every 12hours as needed for cough  Patient Instructions: 1)  it was good to see you today. 2)  Zpack and Tussionex cough syrup as discussed for your symptoms and congestion -  Please take as directed. Contact our office if you believe you're having problems with the medication(s).  3)  use probiotic such as Align while on antibiotics to try to keep the diarrhea from getting worse! 4)  Please keep follow-up appointment as previously scheduled (or in 3 months) to review tests and symptoms, sooner if problems.  Prescriptions: Sandria Senter ER 8-10 MG/5ML LQCR (CHLORPHENIRAMINE-HYDROCODONE) 5cc by mouth every 12hours as needed for cough  #120 x 0   Entered and Authorized by:   Newt Lukes MD   Signed by:    Newt Lukes MD on 11/06/2009   Method used:   Print then Give to Patient   RxID:   [id number] AZITHROMYCIN 250 MG TABS (AZITHROMYCIN) 2 tabs by mouth today, then 1 by mouth daily starting tomorrow  #6 x 0   Entered and Authorized by:   Newt Lukes MD   Signed by:   Newt Lukes MD on 11/06/2009   Method used:   Print then Give to Patient   RxID:   [id number]

## 2010-08-20 NOTE — Letter (Signed)
Summary: [facility] Instructions  [facility]  [address] [place], [place] [address]   Phone: [phone]  Fax: [phone]       EULONDA ANDALON    22-Jun-1961    MRN: [id number]        Procedure Day /Date: Monday 11/12/09     Arrival Time: 2:30      Procedure Time: 3:30     Location of Procedure:                    _x _  [facility] (4th Floor)                        PREPARATION FOR COLONOSCOPY WITH MOVIPREP   Starting 5 days prior to your procedure 11/07/09 do not eat nuts, seeds, popcorn, corn, beans, peas,  salads, or any raw vegetables.  Do not take any fiber supplements (e.g. Metamucil, Citrucel, and Benefiber).  THE DAY BEFORE YOUR PROCEDURE         DATE: 11/11/09   DAY: Sunday  1.  Drink clear liquids the entire day-NO SOLID FOOD  2.  Do not drink anything colored red or purple.  Avoid juices with pulp.  No orange juice.  3.  Drink at least 64 oz. (8 glasses) of fluid/clear liquids during the day to prevent dehydration and help the prep work efficiently.  CLEAR LIQUIDS INCLUDE: Water Jello Ice Popsicles Tea (sugar ok, no milk/cream) Powdered fruit flavored drinks Coffee (sugar ok, no milk/cream) Gatorade Juice: apple, white grape, white cranberry  Lemonade Clear bullion, consomm, broth Carbonated beverages (any kind) Strained chicken noodle soup Hard Candy                             4.  In the morning, mix first dose of MoviPrep solution:    Empty 1 Pouch A and 1 Pouch B into the disposable container    Add lukewarm drinking water to the top line of the container. Mix to dissolve    Refrigerate (mixed solution should be used within 24 hrs)  5.  Begin drinking the prep at 5:00 p.m. The MoviPrep container is divided by 4 marks.   Every 15 minutes drink the solution down to the next mark (approximately 8 oz) until the full liter is complete.   6.  Follow completed prep with 16 oz of clear liquid of your choice (Nothing red or  purple).  Continue to drink clear liquids until bedtime.  7.  Before going to bed, mix second dose of MoviPrep solution:    Empty 1 Pouch A and 1 Pouch B into the disposable container    Add lukewarm drinking water to the top line of the container. Mix to dissolve    Refrigerate  THE DAY OF YOUR PROCEDURE      DATE: 11/12/09  DAY: Monday  Beginning at 10:30 a.m. (5 hours before procedure):         1. Every 15 minutes, drink the solution down to the next mark (approx 8 oz) until the full liter is complete.  2. Follow completed prep with 16 oz. of clear liquid of your choice.    3. You may drink clear liquids until 1:30 (2 HOURS BEFORE PROCEDURE).   MEDICATION INSTRUCTIONS  Unless otherwise instructed, you should take regular prescription medications with a small sip of water   as early as possible the morning of  your procedure.                   OTHER INSTRUCTIONS  You will need a responsible adult at least 49 years of age to accompany you and drive you home.   This person must remain in the waiting room during your procedure.  Wear loose fitting clothing that is easily removed.  Leave jewelry and other valuables at home.  However, you may wish to bring a book to read or  an iPod/MP3 player to listen to music as you wait for your procedure to start.  Remove all body piercing jewelry and leave at home.  Total time from sign-in until discharge is approximately 2-3 hours.  You should go home directly after your procedure and rest.  You can resume normal activities the  day after your procedure.  The day of your procedure you should not:   Drive   Make legal decisions   Operate machinery   Drink alcohol   Return to work  You will receive specific instructions about eating, activities and medications before you leave.    The above instructions have been reviewed and explained to me by   _______________________    I fully understand and can verbalize  these instructions _____________________________ Date _________

## 2010-08-20 NOTE — Assessment & Plan Note (Signed)
Summary: UTI/DLO   Vital Signs:  Patient profile:   50 year old female Height:      62.5 inches (158.75 cm) Weight:      122.50 pounds (55.68 kg) BMI:     22.13 O2 Sat:      98 % on Room air Temp:     97.3 degrees F (36.28 degrees C) oral Pulse rate:   107 / minute BP sitting:   110 / 74  (left arm) Cuff size:   regular  Vitals Entered By: Lucious Groves CMA (April 13, 2010 12:35 PM)  O2 Flow:  Room air CC: C/O possible UTI./kb Is Patient Diabetic? No Comments Patient notes that she has been having severe burning, increased frequency, and bleeding with urination. Patient had back ache yesterday, but not today./kb   History of Present Illness: Sat. work in clinic.  Onset yesterday of low back pain.  Initially thought she had strained her back.  Today onset of burning with urination and some frequency. Noted small amt of blood.  No fever, chills, nausea, or vomiting. No known antibiotic allergies.  Current Medications (verified): 1)  Synthroid 150 Mcg Tabs (Levothyroxine Sodium) .... Take 1 By Mouth Qd 2)  Nortriptyline Hcl 25 Mg Caps (Nortriptyline Hcl) .... Take 1 At Bedtime 3)  Topamax 50 Mg Tabs (Topiramate) .... Take 2 At Bedtime 4)  Cyanocobalamin 1000 Mcg/ml Soln (Cyanocobalamin) .Marland Kitchen.. 1 Injection Q Month  Allergies (verified): No Known Drug Allergies  Past History:  Past Medical History: Last updated: 02/12/2010 anemia , iron defic & B12 defic migraines GERD hypothyroid, post surgical    Md roster: neuro - Reynolds/freeman @ [facility] endo -kumar gyn - [facility] -[facility] PMH reviewed for relevance  Review of Systems      See HPI  Physical Exam  General:  Well-developed,well-nourished,in no acute distress; alert,appropriate and cooperative throughout examination Lungs:  Normal respiratory effort, chest expands symmetrically. Lungs are clear to auscultation, no crackles or wheezes. Heart:  Normal rate and regular rhythm. S1 and S2 normal  without gallop, murmur, click, rub or other extra sounds. Msk:  no CVA tenderness.   Impression & Recommendations:  Problem # 1:  ACUTE CYSTITIS (ICD-595.0) cipro and plenty of fluids.  Follow up as needed. Her updated medication list for this problem includes:    Ciprofloxacin Hcl 500 Mg Tabs (Ciprofloxacin hcl) ..... One by mouth two times a day for 5 days  Complete Medication List: 1)  Synthroid 150 Mcg Tabs (Levothyroxine sodium) .... Take 1 by mouth qd 2)  Nortriptyline Hcl 25 Mg Caps (Nortriptyline hcl) .... Take 1 at bedtime 3)  Topamax 50 Mg Tabs (Topiramate) .... Take 2 at bedtime 4)  Cyanocobalamin 1000 Mcg/ml Soln (Cyanocobalamin) .Marland Kitchen.. 1 injection q month 5)  Ciprofloxacin Hcl 500 Mg Tabs (Ciprofloxacin hcl) .... One by mouth two times a day for 5 days  Other Orders: UA Dipstick W/ Micro (manual) ([address])  Patient Instructions: 1)  Drink plenty of fluids up to 3-4 quarts a day. Cranberry juice is especially recommended in addition to large amounts of water. Avoid caffeine & carbonated drinks, they tend to irritate the bladder, Return in 3-5 days if you're not better: sooner if you're feeling worse.  Prescriptions: CIPROFLOXACIN HCL 500 MG TABS (CIPROFLOXACIN HCL) one by mouth two times a day for 5 days  #10 x 0   Entered and Authorized by:   Evelena Peat MD   Signed by:   Evelena Peat MD on 04/13/2010  Method used:   [facility]  [address] [address] [id number]       [address]       [place], [place]  [address]       Ph: [phone] or [phone]       Fax: [phone]   RxID:   [id number]   Laboratory Results   Urine Tests  Date/Time Received: Lucious Groves [facility]  April 13, 2010 12:40 PM  Date/Time Reported: Lucious Groves [facility]  April 13, 2010 12:41 PM   Routine Urinalysis   Color: colorless Appearance: Hazy Glucose: negative   (Normal Range: Negative) Bilirubin: negative   (Normal Range: Negative) Ketone:  negative   (Normal Range: Negative) Spec. Gravity: <1.005   (Normal Range: 1.003-1.035) Blood: large   (Normal Range: Negative) pH: 6.0   (Normal Range: 5.0-8.0) Protein: negative   (Normal Range: Negative) Urobilinogen: 0.2   (Normal Range: 0-1) Nitrite: negative   (Normal Range: Negative) Leukocyte Esterace: large   (Normal Range: Negative)

## 2010-08-20 NOTE — Assessment & Plan Note (Signed)
Summary: anemia--ch.   History of Present Illness Visit Type: consult  Primary GI MD: Sheryn Bison MD FACP FAGA Primary Provider: Newt Lukes MD Requesting Provider: Newt Lukes MD Chief Complaint: Anemia and loss of appetite  History of Present Illness:   Complex patient who is  a 50 year old Caucasian female referred for evaluation of guaiac positive stools and chronic iron deficiency anemia with a new diagnosis of B12 deficiency.   Melissa Kirby was recently hospitalized from March 17 to March 20 acute gastrointestinal food poisoning, dehydration, and a urinary tract infection. She was treated with IV fluids, and also antibiotics and discharged on ranitidine and oral B12. At the time of her admission her stool cultures were negative and she had mild pancytopenia with a white count of 3500, hemoglobin 12.4, and platelet count 150,000. Because of an elevated d-dimer she underwent CT scan of the chest and abdomen which apparently was unremarkable. At the time of hospitalization she did have guaiac positive stools.  Patient denies chronic acid reflux symptoms or dysphagia. She does have a chronic history of diarrhea, rectal urgency, poor appetite, and chronic iron deficiency anemia requiring previous intravenous iron therapy. She does not have a diagnosis of known celiac disease. Family history is noncontributory.She Has not had previous endoscopy or colonoscopy. Her past history is rather complex chronic migraine headaches requiring Topamax and nortriptyline therapy. She apparently had recent MRI of the head which was unremarkable. Cannot see where her stools before this recent hospitalization had shown evidence of blood loss. She does have chronic thyroid dysfunction from previous surgery and is on Synthroid. She apparently had a lap or scopic cholecystectomy at [facility] in 2009 for dysfunctional gallbladder. She does have mild osteoporosis and is on vitamin D and calcium. She denies  associated skin rashes, joint pains, but has had recurrent stomatitis.   GI Review of Systems    Reports loss of appetite.      Denies abdominal pain, acid reflux, belching, bloating, chest pain, dysphagia with liquids, dysphagia with solids, heartburn, nausea, vomiting, vomiting blood, weight loss, and  weight gain.        Denies anal fissure, black tarry stools, change in bowel habit, constipation, diarrhea, diverticulosis, fecal incontinence, heme positive stool, hemorrhoids, irritable bowel syndrome, jaundice, light color stool, liver problems, rectal bleeding, and  rectal pain.    Current Medications (verified): 1)  Synthroid 150 Mcg Tabs (Levothyroxine Sodium) .... Take 1 By Mouth Qd 2)  Nortriptyline Hcl 25 Mg Caps (Nortriptyline Hcl) .... Take 1 At Bedtime 3)  Topamax 50 Mg Tabs (Topiramate) .... Take 2 At Bedtime 4)  Ranitidine Hcl 150 Mg Tabs (Ranitidine Hcl) .... Take 1 Two Times A Day 5)  Promethazine Hcl 25 Mg Tabs (Promethazine Hcl) .... Take 1 Qid As Needed 6)  Vitamin D 400 Unit Tabs (Cholecalciferol) .... Take 2 By Mouth Qd 7)  Foltx 2.5-25-2 Mg Tabs (Fa-Pyridoxine-Cyancobalamin) .Marland Kitchen.. 1 By Mouth Once Daily  Allergies (verified): No Known Drug Allergies  Past History:  Past medical, surgical, family and social histories (including risk factors) reviewed for relevance to current acute and chronic problems.  Past Medical History: Reviewed history from 10/10/2009 and no changes required. anemia hx, iron defic migraines hypothyroid, post surgical    Md rooster: neuro - Reynolds/freeman @ [facility] endo -[facility] gyn - [facility]  Past Surgical History: Reviewed history from 10/03/2009 and no changes required. Tubal ligation (1989) Thyroid nodule, nonmalignant (1982 & 2000) resection, total thyroidectomy completed 2000 Cholecystectomy (2009)  Family History: Reviewed history from 10/03/2009 and no changes required. Family History Breast cancer 1st degree  relative <50 (grandparent) Family History High cholesterol (parent, grandparent) Family History Hypertension (parent, grandparent) Family History Lung cancer (grandparent) Family History Ovarian cancer (other relative) Heart disease (parent, grandparent) No FH of Colon Cancer:  Social History: Reviewed history from 10/03/2009 and no changes required. 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  The patient denies allergy/sinus, anxiety-new, back pain, blood in urine, breast changes/lumps, change in vision, confusion, cough, coughing up blood, depression-new, fainting, fever, hearing problems, heart murmur, heart rhythm changes, itching, menstrual pain, night sweats, nosebleeds, pregnancy symptoms, shortness of breath, skin rash, sleeping problems, sore throat, swelling of feet/legs, swollen lymph glands, thirst - excessive , urination - excessive , urination changes/pain, urine leakage, vision changes, and voice change.   General:  Complains of fatigue and sleep disorder; denies fever, chills, sweats, anorexia, weakness, malaise, and weight loss. GI:  Complains of diarrhea; denies difficulty swallowing, pain on swallowing, nausea, indigestion/heartburn, vomiting, vomiting blood, abdominal pain, jaundice, gas/bloating, constipation, change in bowel habits, bloody BM's, black BMs, and fecal incontinence. GU:  Denies urinary burning, blood in urine, nocturnal urination, urinary frequency, urinary incontinence, abnormal vaginal bleeding, amenorrhea, menorrhagia, vaginal discharge, pelvic pain, genital sores, painful intercourse, and decreased libido. MS:  Denies joint pain / LOM, joint swelling, joint stiffness, joint deformity, low back pain, muscle weakness, muscle  cramps, muscle atrophy, leg pain at night, leg pain with exertion, and shoulder pain / LOM hand / wrist pain (CTS). Derm:  Denies rash, itching, dry skin, hives, moles, warts, and unhealing ulcers. Neuro:  Complains of headache; denies weakness, paralysis, abnormal sensation, seizures, syncope, tremors, vertigo, transient blindness, frequent falls, frequent headaches, difficulty walking, sciatica, radiculopathy other:, restless legs, memory loss, and confusion. Psych:  Denies depression, anxiety, memory loss, suicidal ideation, hallucinations, paranoia, phobia, and confusion. Endo:  Denies cold intolerance, heat intolerance, polydipsia, polyphagia, polyuria, unusual weight change, and hirsutism. Heme:  Denies bruising, bleeding, enlarged lymph nodes, and pagophagia. Allergy:  Denies hives, rash, sneezing, hay fever, and recurrent infections.  Vital Signs:  Patient profile:   50 year old female Height:      62.5 inches Weight:      120 pounds BMI:     21.68 BSA:     1.55 Pulse rate:   98 / minute Pulse rhythm:   regular BP sitting:   100 / 62  (left arm) Cuff size:   regular  Vitals Entered By: Ok Anis CMA (November 01, 2009 9:22 AM)  Physical Exam  General:  Well developed, well nourished, no acute distress.healthy appearing.   Head:  Normocephalic and atraumatic. Eyes:  PERRLA, no icterus.exam deferred to patient's ophthalmologist.   Neck:  Supple; no masses or thyromegaly. Lungs:  Clear throughout to auscultation. Heart:  Regular rate and rhythm; no murmurs, rubs,  or bruits. Abdomen:  Soft, nontender and nondistended. No masses, hepatosplenomegaly or hernias noted. Normal bowel sounds. Rectal:  Normal exam.hemocult negative.   Msk:  Symmetrical with no gross deformities. Normal posture. Pulses:  Normal pulses noted. Extremities:  No clubbing, cyanosis, edema or deformities noted. Neurologic:  Alert and  oriented x4;  grossly normal neurologically. Cervical Nodes:  No  significant cervical adenopathy. Inguinal  Nodes:  No significant inguinal adenopathy. Psych:  Alert and cooperative. Normal mood and affect.   Impression & Recommendations:  Problem # 1:  DIARRHEA (ICD-787.91) Assessment Unchanged Possible celiac disease with associated diarrhea, weight loss, calcium, iron, and B12 malabsorption. Celiac serologies have been ordered as has endoscopy with small bowel biopsy. Orders: TLB-CBC Platelet - w/Differential (85025-CBCD) TLB-BMP (Basic Metabolic Panel-BMET) (80048-METABOL) TLB-Hepatic/Liver Function Pnl (80076-HEPATIC) TLB-TSH (Thyroid Stimulating Hormone) (84443-TSH) TLB-B12, Serum-Total ONLY (56213-Y86) TLB-Ferritin (82728-FER) TLB-Folic Acid (Folate) (82746-FOL) TLB-IBC Pnl (Iron/FE;Transferrin) (83550-IBC) TLB-Sedimentation Rate (ESR) (85652-ESR) T-Beta Carotene [phone]) T-Giardia Lamblia IFA [phone]) T-Sprue Panel (Celiac Disease Aby Eval) (83516x3/[address]) TLB-IgA (Immunoglobulin A) (82784-IGA)  Problem # 2:  ANEMIA, IRON DEFICIENCY (ICD-280.9) Assessment: Improved Repeat iron studies and will check colonoscopy to be complete. She has not had menstrual periods in 2 years. Her stools turned reason acute GI illness for guaiac positive, but are currently guaiac negative. We need to exclude chronic GI blood loss versus malabsorption versus previous iron deficiency from menorrhagia. Orders: TLB-CBC Platelet - w/Differential (85025-CBCD) TLB-BMP (Basic Metabolic Panel-BMET) (80048-METABOL) TLB-Hepatic/Liver Function Pnl (80076-HEPATIC) TLB-TSH (Thyroid Stimulating Hormone) (84443-TSH) TLB-B12, Serum-Total ONLY (02725-D66) TLB-Ferritin (82728-FER) TLB-Folic Acid (Folate) (82746-FOL) TLB-IBC Pnl (Iron/FE;Transferrin) (83550-IBC) TLB-Sedimentation Rate (ESR) (85652-ESR) T-Beta Carotene [phone]) T-Giardia Lamblia IFA [phone]) T-Sprue Panel (Celiac Disease Aby Eval) (83516x3/[address]) TLB-IgA (Immunoglobulin A)  (82784-IGA)  Problem # 3:  VITAMIN B12 DEFICIENCY (ICD-266.2) Assessment: Unchanged She needs parenteral B12 replacement which has been initiated, then will need weekly nasal B12 spray. Endoscopy and colonoscopy scheduled as mentioned above as are celiac serologies and other labs. There is a family history of leukemia and several family members, and she apparent had previous bone marrow biopsy and workup by her hematologist, and these records have been requested. Orders: TLB-CBC Platelet - w/Differential (85025-CBCD) TLB-BMP (Basic Metabolic Panel-BMET) (80048-METABOL) TLB-Hepatic/Liver Function Pnl (80076-HEPATIC) TLB-TSH (Thyroid Stimulating Hormone) (84443-TSH) TLB-B12, Serum-Total ONLY (29518-A41) TLB-Ferritin (82728-FER) TLB-Folic Acid (Folate) (82746-FOL) TLB-IBC Pnl (Iron/FE;Transferrin) (83550-IBC) TLB-Sedimentation Rate (ESR) (85652-ESR) T-Beta Carotene [phone]) T-Giardia Lamblia IFA [phone]) T-Sprue Panel (Celiac Disease Aby Eval) (83516x3/[address]) TLB-IgA (Immunoglobulin A) (82784-IGA)  Problem # 4:  UNSPECIFIED HYPOTHYROIDISM (ICD-244.9) Assessment: Comment Only  Problem # 5:  MIGRAINE HEADACHE (ICD-346.90) Assessment: Unchanged Will request copy of her recent head MRI for review.  Continue her migraine medications currently.  Patient Instructions: 1)  Please go to the basement for lab work. 2)  You are scheduled for an endoscopy and a colonoscopy. 3)  The medication list was reviewed and reconciled.  All changed / newly prescribed medications were explained.  A complete medication list was provided to the patient / caregiver. 4)  Copy sent to : Dr. Trinna Post Plotnikov 5)  Please continue current medications.  6)  Constipation and Hemorrhoids brochure given.  7)  Colonoscopy and Flexible Sigmoidoscopy brochure given.  8)  Upper Endoscopy brochure given.  9)  B12 parenteral replacement 10)  Stop ranitidine 11)  Probably will need iron replacement depending  on labs. 12)  Hematology records requested and radiographs.  Appended Document: anemia--ch.    Clinical Lists Changes  Medications: Added new medication of MOVIPREP 100 GM  SOLR (PEG-KCL-NACL-NASULF-NA ASC-C) As per prep instructions. - Signed Added new medication of NASCOBAL 500 MCG/0.1ML SOLN (CYANOCOBALAMIN) One spray weekly - Signed Rx of MOVIPREP 100 GM  SOLR (PEG-KCL-NACL-NASULF-NA ASC-C) As per prep instructions.;  #1 x 0;  Signed;  Entered by: Ashok Cordia RN;  Authorized by: Mardella Layman MD [facility];  Method used: [facility]  [address] [facility] [address] [place], [place], [place]  [address], Ph: [phone] or [phone], Fax: [phone] Rx of NASCOBAL 500 MCG/0.1ML SOLN (CYANOCOBALAMIN) One spray weekly;  #1 x 6;  Signed;  Entered by: Ashok Cordia RN;  Authorized by: Mardella Layman MD [facility];  Method used: [facility]  [address] [facility] [address] [place], [place], [place]  [address], Ph: [phone] or [phone], Fax: [phone] Orders: Added new Test order of Colon/Endo (Colon/Endo) - Signed Added new Service order of Vit B12 1000 mcg (J3420) - Signed    Prescriptions: NASCOBAL 500 MCG/0.1ML SOLN (CYANOCOBALAMIN) One spray weekly  #1 x 6   Entered by:   Ashok Cordia RN   Authorized by:   Mardella Layman MD [facility]   Signed by:   Ashok Cordia RN on 11/01/2009   Method used:   [facility]        [facility]  [address] [facility]       [address]       [place], [place]  [address]       Ph: [phone] or [phone]       Fax: [phone]   RxID:   [id number] MOVIPREP 100 GM  SOLR (PEG-KCL-NACL-NASULF-NA ASC-C) As per prep instructions.  #1 x 0   Entered by:   Ashok Cordia RN   Authorized by:   Mardella Layman MD [facility]   Signed by:   Ashok Cordia RN on 11/01/2009   Method used:   [facility]        [facility]  [address] [facility]       [address]        [place], [place]  [address]       Ph: [phone] or [phone]       Fax: [phone]   RxID:   [id number]     Medication Administration  Injection # 1:    Medication: Vit B12 1000 mcg    Diagnosis: VITAMIN B12 DEFICIENCY (ICD-266.2)    Route: IM    Site: L deltoid    Exp Date: 2/13    Lot #: [id number]    Mfr: [employer]    Patient tolerated injection without complications    Given by: Ashok Cordia RN (November 01, 2009 12:22 PM)  Orders Added: 1)  Colon/Endo [Colon/Endo] 2)  Vit B12 1000 mcg [J3420]

## 2010-08-20 NOTE — Assessment & Plan Note (Signed)
Summary: BAD HEADACHE   Vital Signs:  Patient profile:   50 year old female Height:      62.5 inches Weight:      119 pounds BMI:     21.50 O2 Sat:      98 % on Room air Temp:     97.1 degrees F oral Pulse rate:   77 / minute BP sitting:   116 / 88  (left arm) Cuff size:   regular  Vitals Entered By: Payton Spark CMA (January 12, 2010 11:06 AM)  O2 Flow:  Room air CC: Throbbing HA over R eye, chills and achy x 2 days. Not like a typical migraine., Headaches, Headache   History of Present Illness: Pt seen Sat work in clinic with headache R retro-orbital area.  Hx of migrainse and [facility].  Has tried at least 3 different tryptans without relief.  Uses Topamax and nortrptyline for prophylaxis. Tried Migranal and 600mg  ibuprofen without relief.  Headaches      This is a 50 year old woman who presents with Headaches.  onset yesterday R frontal throbbing headache.  The patient complains of nausea, but denies vomiting, nasal congestion, sinus pressure, and photophobia.  The headache is described as throbbing and sharp.  The location of the pain is unilateral on the right.  The patient denies the following high-risk features: fever, neck pain/stiffness, vision loss or change, focal weakness, altered mental status, rash, trauma, and pain worse with exertion.  Prior treatment has included a decongestant and a NSAID.    Has responded to Toradol in past for resistent headaches. Has phenergan at home.  Headache HPI:      The location of the headaches are unilateral on the right.  Headache quality is throbbing and sharp.        Current Medications (verified): 1)  Synthroid 150 Mcg Tabs (Levothyroxine Sodium) .... Take 1 By Mouth Qd 2)  Nortriptyline Hcl 25 Mg Caps (Nortriptyline Hcl) .... Take 1 At Bedtime 3)  Topamax 50 Mg Tabs (Topiramate) .... Take 2 At Bedtime 4)  Foltx 2.5-25-2 Mg Tabs (Fa-Pyridoxine-Cyancobalamin) .Marland Kitchen.. 1 By Mouth Once Daily 5)   Nascobal 500 Mcg/0.53ml Soln (Cyanocobalamin) .... One Spray Weekly 6)  Ranitidine Hcl 150 Mg Tabs (Ranitidine Hcl) .... Take 1 Two Times A Day 7)  Pantoprazole Sodium 20 Mg Tbec (Pantoprazole Sodium) .Marland Kitchen.. 1 By Mouth Once Daily  Allergies: No Known Drug Allergies  Past History:  Past Medical History: Last updated: 11/15/2009 anemia , iron defic & B12 defic migraines GERD hypothyroid, post surgical    Md rooster: neuro - Reynolds/freeman @ [facility] endo -[facility] - [facility] -[facility]  Past Surgical History: Last updated: 10/03/2009 Tubal ligation (1989) Thyroid nodule, nonmalignant (1982 & 2000) resection, total thyroidectomy completed 2000 Cholecystectomy (2009) PMH reviewed for relevance  Review of Systems      See HPI  The patient denies anorexia, fever, weight loss, vision loss, decreased hearing, muscle weakness, and difficulty walking.    Physical Exam  General:  Well-developed,well-nourished,in no acute distress; alert,appropriate and cooperative throughout examination Head:  Normocephalic and atraumatic without obvious abnormalities. No apparent alopecia or balding. Eyes:  No corneal or conjunctival inflammation noted. EOMI. Perrla. Funduscopic exam benign, without hemorrhages, exudates or papilledema. Vision grossly normal. Ears:  External ear exam shows no significant lesions or deformities.  Otoscopic examination reveals clear canals, tympanic membranes are intact bilaterally without bulging, retraction, inflammation or discharge. Hearing is grossly normal bilaterally.  Mouth:  Oral mucosa and oropharynx without lesions or exudates.  Teeth in good repair. Neck:  No deformities, masses, or tenderness noted. supple. Lungs:  Normal respiratory effort, chest expands symmetrically. Lungs are clear to auscultation, no crackles or wheezes. Heart:  Normal rate and regular rhythm. S1 and S2 normal without gallop, murmur, click, rub or other extra  sounds. Neurologic:  alert & oriented X3, cranial nerves II-XII intact, strength normal in all extremities, and gait normal.     Impression & Recommendations:  Problem # 1:  MIGRAINE HEADACHE (ICD-346.90) Assessment Deteriorated Toradol 60 mg IM given. She has used low dose Oxycodone in past for severe reisistant headaches and this is prescribed. Her updated medication list for this problem includes:    Oxycodone Hcl 5 Mg Caps (Oxycodone hcl) ..... One by mouth q 6 hours as needed severe headache  Orders: Ketorolac-Toradol 15mg  (Z6109) Admin of Therapeutic Inj  intramuscular or subcutaneous (60454)  Complete Medication List: 1)  Synthroid 150 Mcg Tabs (Levothyroxine sodium) .... Take 1 by mouth qd 2)  Nortriptyline Hcl 25 Mg Caps (Nortriptyline hcl) .... Take 1 at bedtime 3)  Topamax 50 Mg Tabs (Topiramate) .... Take 2 at bedtime 4)  Foltx 2.5-25-2 Mg Tabs (Fa-pyridoxine-cyancobalamin) .Marland Kitchen.. 1 by mouth once daily 5)  Nascobal 500 Mcg/0.30ml Soln (Cyanocobalamin) .... One spray weekly 6)  Ranitidine Hcl 150 Mg Tabs (Ranitidine hcl) .... Take 1 two times a day 7)  Pantoprazole Sodium 20 Mg Tbec (Pantoprazole sodium) .Marland Kitchen.. 1 by mouth once daily 8)  Oxycodone Hcl 5 Mg Caps (Oxycodone hcl) .... One by mouth q 6 hours as needed severe headache  Patient Instructions: 1)  Follow up with headache specialist if headache not improving by early next week. 2)  May add phenergan to meds for nausea control. Prescriptions: OXYCODONE HCL 5 MG CAPS (OXYCODONE HCL) one by mouth q 6 hours as needed severe headache  #15 x 0   Entered and Authorized by:   Evelena Peat MD   Signed by:   Evelena Peat MD on 01/12/2010   Method used:   Print then Give to Patient   RxID:   [id number]    Medication Administration  Injection # 1:    Medication: Ketorolac-Toradol 15mg     Diagnosis: MIGRAINE HEADACHE (ICD-346.90)    Route: IM    Site: RUOQ gluteus    Exp Date: 11/19/2010    Lot #: [id number]     Comments: 60mg     Patient tolerated injection without complications    Given by: Payton Spark CMA (January 12, 2010 11:39 AM)  Orders Added: 1)  Est. Patient Level IV [30865] 2)  Ketorolac-Toradol 15mg  [J1885] 3)  Admin of Therapeutic Inj  intramuscular or subcutaneous [78469]

## 2010-08-20 NOTE — Progress Notes (Signed)
Summary: rx refill req  Phone Note Refill Request Message from:  Fax from Pharmacy  Refills Requested: Medication #1:  SYNTHROID 150 MCG TABS take 1 by mouth qd   Dosage confirmed as above?Dosage Confirmed  Method Requested: Electronic Next Appointment Scheduled: 06/18/2010 Initial call taken by: Brenton Grills CMA Duncan Dull),  June 10, 2010 11:58 AM    Prescriptions: SYNTHROID 150 MCG TABS (LEVOTHYROXINE SODIUM) take 1 by mouth qd  #30 x 2   Entered by:   Brenton Grills CMA (AAMA)   Authorized by:   Newt Lukes MD   Signed by:   Brenton Grills CMA (AAMA) on 06/10/2010   Method used:   Electronically to        [facility]  [address] [address] [facility] (retail)       [address]       [place], [place]  [address]       Ph: [phone] or [phone]       Fax: [phone]   RxID:   [id number]

## 2010-08-20 NOTE — Progress Notes (Signed)
Summary: Temp?  Phone Note Call from Patient Call back at [facility] Phone [phone]   Caller: Patient Summary of Call: pt called stating that she has been running a fever of 100 while using tylenol and 102 when it wears off. Pt is concerned and would like MD's advise about what else she can use for temperature. Pt is also requesting a out-of-work note forr yesterday, today and tomorrow. Okay to generate? Initial call taken by: Margaret Pyle, CMA,  November 07, 2009 8:38 AM  Follow-up for Phone Call        ok to generate work excuse note - can alternate/overlap use of tylenol 500-1000mg  every 6 hourswith ibuprofen 400-600mg  every 4-6 h as needed for fever - Follow-up by: Newt Lukes MD,  November 07, 2009 9:40 AM  Additional Follow-up for Phone Call Additional follow up Details #1::        pt advised, note in cabinet for pt pick up. Additional Follow-up by: Margaret Pyle, CMA,  November 07, 2009 9:51 AM

## 2010-08-20 NOTE — Miscellaneous (Signed)
Summary: clotest  Clinical Lists Changes  Problems: Added new problem of GASTRITIS (ICD-535.50) Orders: Added new Test order of TLB-H Pylori Screen Gastric Biopsy (83013-CLOTEST) - Signed 

## 2010-08-20 NOTE — Progress Notes (Signed)
  Phone Note Call from Patient   Summary of Call: pt. called to say she is to start 2nd part of movi prep in approx 45 min. but she is having clear liquid stools already and having left sided abd. pain and wanted to know if she should do 2nd part of prep. Pt. also said she has been sick and on antibiotics and had alot of diarrhea stools prior to starting prep.  Initial call taken by: Alease Frame RN,  November 12, 2009 9:58 AM  Follow-up for Phone Call        Dr. Jarold Motto notified of pt's concern and question as to whether she should do 2nd part of prep. Dr. Jarold Motto ordered for pt. to hold off and not do 2nd part of prep but to continue to drink clear liquids up until 2 hrs prior to procedure. Pt. Instructed to do this and not to do any further prep.  Follow-up by: Alease Frame RN,  November 12, 2009 10:02 AM

## 2010-08-20 NOTE — Procedures (Signed)
Summary: Colonoscopy  Patient: [name] Note: All result statuses are Final unless otherwise noted.  Tests: (1) Colonoscopy (COL)   COL Colonoscopy           DONE     [facility]     [address]     [place], [place]  [address]           COLONOSCOPY PROCEDURE REPORT           PATIENT:  Melissa Kirby, Melissa Kirby  MR#:  [id number]     BIRTHDATE:  24-Jan-1961, 48 yrs. old  GENDER:  female     ENDOSCOPIST:  Vania Rea. Jarold Motto, MD, [facility]     REF. BY:     PROCEDURE DATE:  11/12/2009     PROCEDURE:  Colonoscopy with biopsy     ASA CLASS:  Class II     INDICATIONS:  FOBT positive stool, abdominal pain, unexplained     diarrhea     MEDICATIONS:   Fentanyl 100 mcg IV, Versed 9 mg IV           DESCRIPTION OF PROCEDURE:   After the risks benefits and     alternatives of the procedure were thoroughly explained, informed     consent was obtained.  Digital rectal exam was performed and     revealed no abnormalities.   The LB [id number] endoscope     was introduced through the anus and advanced to the terminal ileum     which was intubated for a short distance, without limitations.     The quality of the prep was poor, using MoviPrep.  The instrument     was then slowly withdrawn as the colon was fully examined.     <<PROCEDUREIMAGES>>           FINDINGS:  No polyps or cancers were seen.  no active bleeding or     blood in c.  This was otherwise a normal examination of the colon.     ileum normal.random colon biopsies done.   Retroflexed views in     the rectum revealed no abnormalities.    The scope was then     withdrawn from the patient and the procedure completed.           COMPLICATIONS:  None     ENDOSCOPIC IMPRESSION:     1) No polyps or cancers     2) No active bleeding or blood in c     3) Otherwise normal examination     NO INFLAMMATORY ILEO/COLITIS NOTED.     RECOMMENDATIONS:     1) Await biopsy results     2) Upper endoscopy will be scheduled     REPEAT EXAM:  No        ______________________________     Vania Rea. Jarold Motto, MD, [facility]           CC:           n.     eSIGNED:   Vania Rea. Patterson at 11/12/2009 03:50 PM           Suezanne Jacquet, [phone]  Note: An exclamation mark (!) indicates a result that was not dispersed into the flowsheet. Document Creation Date: 11/12/2009 3:51 PM _______________________________________________________________________  (1) Order result status: Final Collection or observation date-time: 11/12/2009 15:45 Requested date-time:  Receipt date-time:  Reported date-time:  Referring Physician:   Ordering Physician: Sheryn Bison [phone]) Specimen Source:  Source: Launa Grill Order Number: [id number] Lab site:

## 2010-08-20 NOTE — Assessment & Plan Note (Signed)
Summary: NEW /  [employer] / [employer]WS  #   Vital Signs:  Patient profile:   50 year old female Height:      62.5 inches (158.75 cm) Weight:      120.8 pounds (54.91 kg) BMI:     21.82 O2 Sat:      97 % on Room air Temp:     97.7 degrees F (36.50 degrees C) oral Pulse rate:   113 / minute BP sitting:   100 / 76  (left arm) Cuff size:   regular  Vitals Entered By: Orlan Leavens (October 03, 2009 3:29 PM)  O2 Flow:  Room air CC: New patient Is Patient Diabetic? No Pain Assessment Patient in pain? no        Primary Care Provider:  Newt Lukes MD  CC:  New patient.  History of Present Illness: new pt to me - here to est care here with spouse.  1) hypothyroid - follows with local endo for same -complicated hx reviewed. reports compliance with ongoing medical treatment and last changes in medication dose approx 2 mos ago. denies adverse side effects related to current therapy but doesn't feel right. hx weight loss, no skin changes  2) headaches/migraines - follows with neuro for same. has been on topamax over 12mos without sig change in symptoms. ha worst with stress and when " thyroid is off"  3) insomina. worse with shift work schedule, better with new med for migraine (pamelor).  4) anemia, hx iron defic requiring iv iron infusion 10 yr ago. no need blood txfn, no bleeding symptoms, no prior gi or heme workup. no bm bx. feels fatigued constantly.  Preventive Screening-Counseling & Management  Alcohol-Tobacco     Alcohol drinks/day: 0     Smoking Status: never     Tobacco Counseling: not indicated; no tobacco use  Caffeine-Diet-Exercise     Caffeine Counseling: not indicated; caffeine use is not excessive or problematic     Does Patient Exercise: no     Exercise Counseling: to improve exercise regimen     Depression Counseling: not indicated; screening negative for depression  Safety-Violence-Falls     Seat Belt Use: yes     Firearms in the Home: firearms in the home  Firearm Counseling: not indicated; uses recommended firearm safety measures     Smoke Detectors: yes     Violence in the Home: no risk noted     Fall Risk Counseling: not indicated; no significant falls noted  Current Medications (verified): 1)  Synthroid 150 Mcg Tabs (Levothyroxine Sodium) .... Take 1 By Mouth Qd 2)  Nortriptyline Hcl 25 Mg Caps (Nortriptyline Hcl) .... Take 1 At Bedtime 3)  Topamax 50 Mg Tabs (Topiramate) .... Take 2 At Bedtime  Allergies (verified): No Known Drug Allergies  Past History:  Past Medical History: anemia hx, iron defic migraines hypothyroid, post surgical  Md rooster: neuro - Reynolds/freeman @ [facility] endo -[facility] - [facility]  Past Surgical History: Tubal ligation (1989) Thyroid nodule, nonmalignant (1982 & 2000) resection, total thyroidectomy completed 2000 Cholecystectomy (2009)  Family History: Family History Breast cancer 1st degree relative <50 (grandparent) Family History High cholesterol (parent, grandparent) Family History Hypertension (parent, grandparent) Family History Lung cancer (grandparent) Family History Ovarian cancer (other relative) Heart disease (parent, grandparent)  Social History: Never Smoked married, lives with spouse 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  The patient denies anorexia, fever, weight gain, vision loss, hoarseness, chest pain, syncope, dyspnea on exertion, peripheral edema, abdominal pain, severe indigestion/heartburn, incontinence, muscle weakness, suspicious skin lesions, transient blindness, depression, and enlarged lymph nodes.         also see HPI above. I have reviewed all other systems and they were negative.   Physical Exam  General:  thin, alert, well-developed, well-nourished, and cooperative to examination.   spouse at side Head:   Normocephalic and atraumatic without obvious abnormalities. No apparent alopecia or balding. Eyes:  vision grossly intact; pupils equal, round and reactive to light.  conjunctiva and lids normal.   no icterus Ears:  normal pinnae bilaterally, without erythema, swelling, or tenderness to palpation. TMs clear, without effusion, or cerumen impaction. Hearing grossly normal bilaterally  Mouth:  teeth and gums in good repair; mucous membranes moist, without lesions or ulcers. oropharynx clear without exudate, no erythema.  Neck:  well healed thyroidectomy scar, no masses or nodules Lungs:  normal respiratory effort, no intercostal retractions or use of accessory muscles; normal breath sounds bilaterally - no crackles and no wheezes.    Heart:  normal rate, regular rhythm, no murmur, and no rub. BLE without edema. normal DP pulses and normal cap refill in all 4 extremities    Abdomen:  soft, non-tender, normal bowel sounds, no distention; no masses and no appreciable hepatomegaly or splenomegaly.   Genitalia:  defer gyn Msk:  No deformity or scoliosis noted of thoracic or lumbar spine.   Neurologic:  alert & oriented X3 and cranial nerves II-XII symetrically intact.  strength normal in all extremities, sensation intact to light touch, and gait normal. speech fluent without dysarthria or aphasia; follows commands with good comprehension.  Skin:  pale, no rashes, vesicles, ulcers, or erythema. No nodules or irregularity to palpation.  Cervical Nodes:  No lymphadenopathy noted Axillary Nodes:  No palpable lymphadenopathy Inguinal Nodes:  No significant adenopathy Psych:  Oriented X3, memory intact for recent and remote, normally interactive, good eye contact, not anxious appearing, not depressed appearing, and not agitated.      Impression & Recommendations:  Problem # 1:  UNSPECIFIED HYPOTHYROIDISM (ICD-244.9) s/p total completed thyroidectomy in 2000 does not wish to go to multiple specialists if  possible check labs now and adjust meds as needed based on this and symptoms, explained may need endo asst. Her updated medication list for this problem includes:    Synthroid 150 Mcg Tabs (Levothyroxine sodium) .Marland Kitchen... Take 1 by mouth qd  Orders: TLB-TSH (Thyroid Stimulating Hormone) (84443-TSH)  Problem # 2:  MIGRAINE HEADACHE (ICD-346.90) ?consider stopping topamax if not helping control ha due to se profile..poss adv rxn cont meds otherwise per neuro, relief with tca tx for sleep  Problem # 3:  ANEMIA-NOS (ICD-285.9) iron defic by hx check labs now refer to gi for eval of same, ?malabs  Orders: TLB-CBC Platelet - w/Differential (85025-CBCD) TLB-B12 + Folate Pnl (16109_60454-U98/JXB) TLB-Ferritin (82728-FER) TLB-Iron, (Fe) Total (83540-FE) Gastroenterology Referral (GI)  Problem # 4:  CIRCADIAN RHYTHM SLEEP DISORDER SHIFT WORK TYPE (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  Complete Medication List: 1)  Synthroid 150 Mcg Tabs (Levothyroxine sodium) .... Take 1 by mouth qd 2)  Nortriptyline Hcl 25 Mg Caps (Nortriptyline hcl) .... Take 1 at bedtime 3)  Topamax 50 Mg Tabs (Topiramate) .... Take 2 at bedtime  Patient Instructions: 1)  it was good to see you today. 2)  no medication changes at this time 3)  test(s) ordered today - your results will be posted on the phone tree for review in 48-72 hours from the time of test completion; call [phone] and enter your 9 digit MRN (listed above on this page, just below your name); if any changes need to be made or there are abnormal  results, you will be contacted directly.  4)  we'll make referral to GI for your iron deficiency anemia. Our office will contact you regarding this appointment once made.  5)  continue to follow with dr. Thad Ranger, and gyn at this time 6)  will send for records from your prior providers 7)  Please schedule a follow-up appointment in 6-12 weeks, sooner if problems.    Mammogram  Procedure date:  07/22/2007  Findings:      No specific mammographic evidence of malignancy.    Pap Smear  Procedure date:  07/21/2008  Findings:      Interpretation/Result:Negative for intraepithelial Lesion or Malignancy.

## 2010-08-20 NOTE — Letter (Signed)
Summary: Patient Notice- Colon Biospy Results  [facility]  [address] [place], [place] [address]   Phone: [phone]  Fax: [phone]        November 16, 2009 MRN: [id number]    Melissa Kirby [address] [place], [place]  [address]    Dear [name],  I am pleased to inform you that the biopsies taken during your recent colonoscopy did not show any evidence of cancer upon pathologic examination.  Additional information/recommendations:  __No further action is needed at this time.  Please follow-up with      your primary care physician for your other healthcare needs.  __Please call [phone] to schedule a return visit to review      your condition.  x__Continue with the treatment plan as outlined on the day of your      exam.  __You should have a repeat colonoscopy examination for this problem           in _ years.  Please call us if you are having persistent problems or have questions about your condition that have not been fully answered at this time.  Sincerely,  Mardella Layman MD [facility]   This letter has been electronically signed by your physician.  Appended Document: Patient Notice- Colon Biospy Results letter mailed 5.2.11

## 2010-08-20 NOTE — Letter (Signed)
Summary: Out of Work  [facility]  [address] [place], [place] [address]   Phone: [phone]  Fax: [phone]    October 10, 2009   Employee:  Melissa Kirby    To Whom It May Concern:   For Medical reasons, please excuse the above named employee from work for the following dates:  Start: 10/04/09    End: 10/10/09, May return back to work tomorrow 10/11/09    If you need additional information, please feel free to contact our office.         Sincerely,    Dr. Rene Paci

## 2010-08-20 NOTE — Letter (Signed)
Summary: New Patient letter  [facility]  [address] [place], [place] [address]   Phone: [phone]  Fax: [phone]       10/04/2009 MRN: [id number]  Melissa Kirby [address] [place], [place]  [address]  Dear Ms. Hardie Pulley,  Welcome to the Gastroenterology Division at [facility].    You are scheduled to see Dr. Jarold Motto on 11-01-09 at 9:30a.m. on the 3rd floor at [facility], [address]  We ask that you try to arrive at our office 15 minutes prior to your appointment time to allow for check-in.  We would like you to complete the enclosed self-administered evaluation form prior to your visit and bring it with you on the day of your appointment.  We will review it with you.  Also, please bring a complete list of all your medications or, if you prefer, bring the medication bottles and we will list them.  Please bring your insurance card so that we may make a copy of it.  If your insurance requires a referral to see a specialist, please bring your referral form from your primary care physician.  Co-payments are due at the time of your visit and may be paid by cash, check or credit card.     Your office visit will consist of a consult with your physician (includes a physical exam), any laboratory testing he/she may order, scheduling of any necessary diagnostic testing (e.g. x-ray, ultrasound, CT-scan), and scheduling of a procedure (e.g. Endoscopy, Colonoscopy) if required.  Please allow enough time on your schedule to allow for any/all of these possibilities.    If you cannot keep your appointment, please call [phone] to cancel or reschedule prior to your appointment date.  This allows [place] the opportunity to schedule an appointment for another patient in need of care.  If you do not cancel or reschedule by 5 p.m. the business day prior to your appointment date, you will be charged a $50.00 late cancellation/no-show fee.    Thank you for choosing [facility] for your medical needs.  We appreciate the opportunity to care for you.  Please visit [place] at our website  to learn more about our practice.                     Sincerely,                                                             The Gastroenterology Division

## 2010-08-20 NOTE — Letter (Signed)
Summary: Out of Work  [facility]  [address] [place], [place] [address]   Phone: [phone]  Fax: [phone]    November 07, 2009   Employee:  Melissa Kirby    To Whom It May Concern:   For Medical reasons, please excuse the above named employee from work for the following dates:  Start:   Tuesday April 19th 2011  End:   Friday April 22th 2011 -- To return to work 11/12/2009  If you need additional information, please feel free to contact our office.         Sincerely,    Margaret Pyle CMA

## 2010-08-20 NOTE — Letter (Signed)
Summary: Patient [facility] Biopsy Results  [facility]  [address] [place], [place] [address]   Phone: [phone]  Fax: [phone]        November 16, 2009 MRN: [id number]    Melissa Kirby [address] [place], [place]  [address]    Dear Ms. Leiker,  I am pleased to inform you that the biopsies taken during your recent endoscopic examination did not show any evidence of cancer upon pathologic examination.  Additional information/recommendations:  __No further action is needed at this time.  Please follow-up with      your primary care physician for your other healthcare needs.  __ Please call [phone] to schedule a return visit to review      your condition.  _xx_ Continue with the treatment plan as outlined on the day of your      exam.  __ You should have a repeat endoscopic examination for this problem              in _ months/years.   Please call us if you are having persistent problems or have questions about your condition that have not been fully answered at this time.  Sincerely,  Mardella Layman MD [facility]  This letter has been electronically signed by your physician.  Appended Document: Patient Notice-Endo Biopsy Results letter mailed 5.2.11

## 2010-08-20 NOTE — Progress Notes (Signed)
Summary: speak to nurse  Phone Note Call from Patient Call back at Home Phone [phone]   Caller: Patient Call For: [facility] Reason for Call: Talk to Nurse Summary of Call: Patient wants to speak to nurse regarding her path results Initial call taken by: Tawni Levy,  Nov 20, 2009 1:50 PM  Follow-up for Phone Call        Pt received letter re biopsy.  SHe asks if she has celiac and if she needs to be on the gluten free diet.   Follow-up by: Ashok Cordia RN,  Nov 20, 2009 2:18 PM  Additional Follow-up for Phone Call Additional follow up Details #1::        Gluten sensitivity.Marland KitchenMarland KitchenBx. did not confirm celiac disease...still would try gluten free diet for 6-8 weeeks. Additional Follow-up by: Mardella Layman MD Clementeen Graham,  Nov 20, 2009 2:21 PM    Additional Follow-up for Phone Call Additional follow up Details #2::    Lm for pt to call.  Ashok Cordia RN  Nov 21, 2009 11:31 AM  Pt notified.  She will report back after 6-8 weeks of gluten free diet. Follow-up by: Ashok Cordia RN,  Nov 21, 2009 12:01 PM

## 2010-08-20 NOTE — Progress Notes (Signed)
Summary: Call Report  Phone Note Other Incoming   Caller: [facility] Summary of Call: [facility] Triage Call Report Triage Record Num: [id number] Operator: Scherry Ran Patient Name: Melissa Kirby Call Date & Time: 04/13/2010 11:05:42AM Patient Phone: [phone] PCP: Rene Paci Patient Gender: Female PCP Fax : [phone] Patient DOB: [birth year] Practice Name: [facility] Reason for Call: Pt calling about burning with urination and scant amout of blood in urine. Pt has the urge to urinate and it burns. Transferred to Ofiice to schedule appointment for today. Protocol(s) Used: Bloody Urine Protocol(s) Used: Urinary Symptoms - Female Recommended Outcome per Protocol: See Janai Brannigan within 24 hours Reason for Outcome: Blood in urine Has one or more urinary tract symptoms Care Advice:  ~ Go to the ED IMMEDIATELY if repeated vomiting or severe pain occurs.  ~ SYMPTOM / CONDITION MANAGEMENT  ~ CAUTIONS Limit carbonated, alcoholic, and caffeinated beverages such as coffee, tea and soda. Avoid nonprescription cold and allergy medications that contain caffeine. Limit intake of tomatoes, fruit juices (except for unsweetened cranberry juice), dairy products, spicy foods, sugar, and artificial sweeteners (aspartame or saccharine). Stop or decrease smoking. Reducing exposure to bladder irritants may help lessen urgency.  ~ Increase intake of fluids to 8-16 eight oz. glasses (2,000 to 4,000 cc per day or 1.6 to 3.2 L) so urine is a pale yellow color, to help flush bacteria from the bladder, unless instructed to restrict fluid intake for other medical reasons. Limit fluids with sugar.  ~ Analgesic/Antipyretic Advice - Acetaminophen: Consider acetaminophen as directed on label or by pharmacist/Jermaine Neuharth for pain or fever PRECAUTIONS: - Use if there is no history of liver disease, alcoholism, or intake of three or more alcohol drinks per day - Only if approved by Darel Ricketts during  pregnancy or when breastfeeding - During pregnancy, acetaminophen should not be taken more than 3 consecutive days without telling [place]urn [place]aus - Do not  Initial call taken by: Margaret Pyle, CMA,  April 15, 2010 9:22 AM  Follow-up for Phone Call        noted - seen sat clinic - ov prev reviewed - thx Follow-up by: Newt Lukes MD,  April 15, 2010 9:26 AM

## 2010-08-20 NOTE — Assessment & Plan Note (Signed)
Summary: 4 MO ROV /NWS  #   Vital Signs:  Patient profile:   50 year old female Height:      62.5 inches (158.75 cm) Weight:      122.6 pounds (55.73 kg) O2 Sat:      97 % on Room air Temp:     99.1 degrees F (37.28 degrees C) oral Pulse rate:   101 / minute BP sitting:   110 / 70  (left arm) Cuff size:   regular  Vitals Entered By: Orlan Leavens RMA (June 18, 2010 11:09 AM)  O2 Flow:  Room air CC: 4 month follow-up Is Patient Diabetic? No Pain Assessment Patient in pain? no        Primary Care Provider:  Newt Lukes MD  CC:  4 month follow-up.  History of Present Illness: here for f/u  1) hypothyroid - prev followed with local endo for same -complicated hx reviewed. reports compliance with ongoing medical treatment and last changes in medication dose approx 2 mos ago. denies adverse side effects related to current therapy but "doesn't feel right" - improved since change to gluten free diet. no recent weight loss, no skin changes  2) headaches/migraines - prev followed with neuro for same but since stable symptoms would like to get refill on meds here. has been on topamax >60mos and pamelor. ha worst with stress and when " thyroid is off"; improved symptoms with gluten free diet  3) insomina. worse with shift work schedule, better with new med for migraine (pamelor).  4) anemia, hx iron defic, also found to have B12 defic -09/2009 -s/p  eval  by GI, EGD/colo 10/2009 - negative for celiac dz but improved symptoms with diet changes - wants to try b12 pills inplace of monthly shots  5) GERD - reports compliance with ongoing medical treatment and no changes in medication dose or frequency. denies adverse side effects related to current therapy.    Current Medications (verified): 1)  Synthroid 150 Mcg Tabs (Levothyroxine Sodium) .... Take 1 By Mouth Qd 2)  Nortriptyline Hcl 25 Mg Caps (Nortriptyline Hcl) .... Take 1 At Bedtime 3)  Topamax 50 Mg Tabs (Topiramate) ....  Take 2 At Bedtime 4)  Cyanocobalamin 1000 Mcg/ml Soln (Cyanocobalamin) .Marland Kitchen.. 1 Injection Q Month 5)  Multivitamins  Tabs (Multiple Vitamin) .... Take 1 By Mouth Once Daily 6)  Vitamin D 1000 Unit Tabs (Cholecalciferol) .... Take 1 By Mouth Once Daily  Allergies (verified): No Known Drug Allergies  Past History:  Past Medical History: anemia a/w iron defic & B12 defic 09/2009 migraines  GERD hypothyroid, post surgical    Md roster: neuro - [facility]o -[facility] - [facility] -[facility]  Review of Systems  The patient denies fever, weight loss, chest pain, syncope, and peripheral edema.         daily palpitations, lasts only few seconds but will "stop me when it happens" - ongoing since spring 2011 hosp for food posioning  Physical Exam  General:  pale, alert, well-developed, well-nourished, and cooperative to examination.    Neck:  No deformities, masses, or tenderness noted. supple. Lungs:  normal respiratory effort, no intercostal retractions or use of accessory muscles; normal breath sounds bilaterally - no crackles and no wheezes.    Heart:  slightly tachy rate but regular rhythm, no murmur, and no rub. BLE without edema.  Psych:  Oriented X3, memory intact for recent and remote, normally interactive, good eye contact, not anxious  appearing, not depressed appearing, and not agitated.      Impression & Recommendations:  Problem # 1:  UNSPECIFIED HYPOTHYROIDISM (ICD-244.9)  ongoing daily palp symptoms - pt concerned due to FH CAD - recheck tsh now - if normal, refer to cards (on event monitor during tele hosp 09/2009 - declines holter before cards eval) Her updated medication list for this problem includes:    Synthroid 150 Mcg Tabs (Levothyroxine sodium) .Marland Kitchen... Take 1 by mouth qd  Orders: TLB-TSH (Thyroid Stimulating Hormone) (84443-TSH)  Labs Reviewed: TSH: 0.72 (02/12/2010)    Chol: 178 (10/03/2009)   HDL: 70.50 (10/03/2009)   LDL: 93  (10/03/2009)   TG: 71.0 (10/03/2009)  Problem # 2:  MIGRAINE HEADACHE (ICD-346.90)  [facility]- can refill here so long as no change in HA symptoms on topamax and pamelor  Problem # 3:  VITAMIN B12 DEFICIENCY (ICD-266.2)  IM montly replacment ongoing since 09/2009 -  feels improved since gluten free diet despite neg eval for celiac dz ok to try daily pill in place of monthly shot - recheck b12 level next OV  Problem # 4:  ANEMIA, IRON DEFICIENCY (ICD-280.9)  Her updated medication list for this problem includes:    Vitamin B-12 1000 Mcg Tabs (Cyanocobalamin) .Marland Kitchen... 1 by mouth once daily  Hgb: 12.1 (11/01/2009)   Hct: 34.5 (11/01/2009)   Platelets: 236.0 (11/01/2009) RBC: 3.83 (11/01/2009)   RDW: 13.0 (11/01/2009)   WBC: 5.4 (11/01/2009) MCV: 90.2 (11/01/2009)   MCHC: 34.9 (11/01/2009) Ferritin: 59.2 (11/01/2009) Iron: 105 (11/01/2009)   % Sat: 27.9 (11/01/2009) B12: >1500 pg/mL (11/01/2009)   Folate: 19.1 (11/01/2009)   TSH: 0.72 (02/12/2010)  Complete Medication List: 1)  Synthroid 150 Mcg Tabs (Levothyroxine sodium) .... Take 1 by mouth qd 2)  Nortriptyline Hcl 25 Mg Caps (Nortriptyline hcl) .... Take 1 at bedtime 3)  Topamax 50 Mg Tabs (Topiramate) .... Take 2 at bedtime 4)  Vitamin B-12 1000 Mcg Tabs (Cyanocobalamin) .Marland Kitchen.. 1 by mouth once daily 5)  Multivitamins Tabs (Multiple vitamin) .... Take 1 by mouth once daily 6)  Vitamin D 1000 Unit Tabs (Cholecalciferol) .... Take 1 by mouth once daily  Patient Instructions: 1)  it was good to see you today. 2)  test(s) ordered today - your results will be posted on the phone tree for review in 48-72 hours from the time of test completion; call [phone] and enter your 9 digit MRN (listed above on this page, just below your name); if any changes need to be made or there are abnormal results, you will be contacted directly.  3)  If thyroid in normal balance, will refer to cardiology for evaluation of  palpitation symptoms as discussed 4)  stop B12 shots and take daily B12 pills instead 5)  refill on topamax - we will fill your headache meds here so long as migraine symptoms stable 6)  Please schedule a follow-up appointment in 4 months for thyroid check and recheck B12 levels, sooner if problems.  Prescriptions: TOPAMAX 50 MG TABS (TOPIRAMATE) take 2 at bedtime  #60 x 6   Entered and Authorized by:   Newt Lukes MD   Signed by:   Newt Lukes MD on 06/18/2010   Method used:   Electronically to        [facility]  [address] [address] [id number] (retail[facility]       [address]       [place]  [place], [place]  [address]       Ph: [phone] or [phone]       Fax: [phone]   RxID:   [id number]    Orders Added: 1)  TLB-TSH (Thyroid Stimulating Hormone) [84443-TSH] 2)  Est. Patient Level IV [[address]]

## 2010-08-22 NOTE — Letter (Signed)
Summary: Out of Work  [facility]  [address] [place], [place] [address]   Phone: [phone]  Fax: [phone]    August 05, 2010   Employee:  Darl Pikes    To Whom It May Concern:   For Medical reasons, please excuse the above named employee from work for the following dates:  Start:   Monday January 16th 2011  End:   Thursday January 19th 2011 - To return Friday January 20th 2011  If you need additional information, please feel free to contact our office.         Sincerely,    Margaret Pyle, CMA

## 2010-08-22 NOTE — Progress Notes (Signed)
Summary: Flu?  Phone Note Call from Patient Call back at [facility] Phone [phone]   Caller: Patient Summary of Call: Pt called stating she was given rx for Tamiflu after spouse saw Seymour Bars at [facility] and Dx with the flu. Pt says she had been taking medication but has still contracted the flu and wants to know if she should start tamiflu two times a day instead of once daily, should she be out of work for 3-4 days like her spouse was advised and if so, pt is requesting a work note. Please advise Initial call taken by: Margaret Pyle, CMA,  August 05, 2010 10:14 AM  Follow-up for Phone Call        ok to take tamiflu two times a day until supply of tamiflu gone (no additional meds needed ) - ok to remain out of work x 72h - please generate note and i will sign - thanks  Additional Follow-up for Phone Call Additional follow up Details #1::        Pt advised of above. Note generated and mailed to pt's home address per pt req. Additional Follow-up by: Margaret Pyle, CMA,  August 05, 2010 1:18 PM

## 2010-08-22 NOTE — Miscellaneous (Signed)
Summary: exposure to flu  Clinical Lists Changes  Medications: Added new medication of TAMIFLU 75 MG CAPS (OSELTAMIVIR PHOSPHATE) 1 capsule by mouth once daily x 10 days - Signed Rx of TAMIFLU 75 MG CAPS (OSELTAMIVIR PHOSPHATE) 1 capsule by mouth once daily x 10 days;  #10 x 0;  Signed;  Entered by: Seymour Bars DO;  Authorized by: Seymour Bars DO;  Method used: [facility]  [address] [id number] [address] [place], [place], [place]  [address], Ph: [phone] or [phone], Fax: [phone]    Prescriptions: TAMIFLU 75 MG CAPS (OSELTAMIVIR PHOSPHATE) 1 capsule by mouth once daily x 10 days  #10 x 0   Entered and Authorized by:   Seymour Bars DO   Signed by:   Seymour Bars DO on 08/03/2010   Method used:   [facility]        [facility]  [address] [id number]       [address]       [place], [place]  [address]       Ph: [phone] or [phone]       Fax: [phone]   RxID:   [id number]

## 2010-08-28 NOTE — Letter (Signed)
Summary: FMLA forms/Health Services  FMLA forms/Health Services   Imported By: Sherian Rein 08/19/2010 13:54:28  _____________________________________________________________________  External Attachment:    Type:   Image     Comment:   External Document

## 2010-10-09 ENCOUNTER — Encounter: Payer: Self-pay | Admitting: *Deleted

## 2010-10-14 LAB — URINALYSIS, ROUTINE W REFLEX MICROSCOPIC
Glucose, UA: NEGATIVE mg/dL
Ketones, ur: 15 mg/dL — AB
Nitrite: NEGATIVE
Protein, ur: 30 mg/dL — AB
Specific Gravity, Urine: >1.03 — ABNORMAL HIGH (ref 1.005–1.030)
Urobilinogen, UA: 0.2 mg/dL (ref 0.0–1.0)
pH: 5 (ref 5.0–8.0)

## 2010-10-14 LAB — CBC
HCT: 27.8 % — ABNORMAL LOW (ref 36.0–46.0)
HCT: 28.8 % — ABNORMAL LOW (ref 36.0–46.0)
HCT: 31.3 % — ABNORMAL LOW (ref 36.0–46.0)
HCT: 39.5 % (ref 36.0–46.0)
Hemoglobin: 10.1 g/dL — ABNORMAL LOW (ref 12.0–15.0)
Hemoglobin: 10.9 g/dL — ABNORMAL LOW (ref 12.0–15.0)
Hemoglobin: 13.7 g/dL (ref 12.0–15.0)
Hemoglobin: 9.8 g/dL — ABNORMAL LOW (ref 12.0–15.0)
MCHC: 34.7 g/dL (ref 30.0–36.0)
MCHC: 35 g/dL (ref 30.0–36.0)
MCHC: 35.2 g/dL (ref 30.0–36.0)
MCHC: 35.2 g/dL (ref 30.0–36.0)
MCV: 89.1 fL (ref 78.0–100.0)
MCV: 89.2 fL (ref 78.0–100.0)
MCV: 89.2 fL (ref 78.0–100.0)
MCV: 89.5 fL (ref 78.0–100.0)
Platelets: 116 10*3/uL — ABNORMAL LOW (ref 150–400)
Platelets: 125 10*3/uL — ABNORMAL LOW (ref 150–400)
Platelets: 150 10*3/uL (ref 150–400)
Platelets: 184 10*3/uL (ref 150–400)
RBC: 3.1 MIL/uL — ABNORMAL LOW (ref 3.87–5.11)
RBC: 3.23 MIL/uL — ABNORMAL LOW (ref 3.87–5.11)
RBC: 3.51 MIL/uL — ABNORMAL LOW (ref 3.87–5.11)
RBC: 4.43 MIL/uL (ref 3.87–5.11)
RDW: 13.1 % (ref 11.5–15.5)
RDW: 13.1 % (ref 11.5–15.5)
RDW: 13.3 % (ref 11.5–15.5)
RDW: 13.4 % (ref 11.5–15.5)
WBC: 10.3 10*3/uL (ref 4.0–10.5)
WBC: 3.5 10*3/uL — ABNORMAL LOW (ref 4.0–10.5)
WBC: 3.8 10*3/uL — ABNORMAL LOW (ref 4.0–10.5)
WBC: 5.2 10*3/uL (ref 4.0–10.5)

## 2010-10-14 LAB — COMPREHENSIVE METABOLIC PANEL
ALT: 65 U/L — ABNORMAL HIGH (ref 0–35)
AST: 89 U/L — ABNORMAL HIGH (ref 0–37)
Albumin: 2.6 g/dL — ABNORMAL LOW (ref 3.5–5.2)
Alkaline Phosphatase: 116 U/L (ref 39–117)
BUN: 5 mg/dL — ABNORMAL LOW (ref 6–23)
CO2: 20 mEq/L (ref 19–32)
Calcium: 7.8 mg/dL — ABNORMAL LOW (ref 8.4–10.5)
Chloride: 115 mEq/L — ABNORMAL HIGH (ref 96–112)
Creatinine, Ser: 0.78 mg/dL (ref 0.4–1.2)
GFR calc Af Amer: >60 mL/min (ref >60)
GFR calc non Af Amer: >60 mL/min (ref >60)
Glucose, Bld: 93 mg/dL (ref 70–99)
Potassium: 3.3 mEq/L — ABNORMAL LOW (ref 3.5–5.1)
Sodium: 137 mEq/L (ref 135–145)
Total Bilirubin: 0.2 mg/dL — ABNORMAL LOW (ref 0.3–1.2)
Total Protein: 5.4 g/dL — ABNORMAL LOW (ref 6.0–8.3)

## 2010-10-14 LAB — DIFFERENTIAL
Basophils Absolute: 0 10*3/uL (ref 0.0–0.1)
Basophils Absolute: 0 10*3/uL (ref 0.0–0.1)
Basophils Absolute: 0 10*3/uL (ref 0.0–0.1)
Basophils Absolute: 0 10*3/uL (ref 0.0–0.1)
Basophils Relative: 0 % (ref 0–1)
Basophils Relative: 0 % (ref 0–1)
Basophils Relative: 0 % (ref 0–1)
Basophils Relative: 0 % (ref 0–1)
Eosinophils Absolute: 0 10*3/uL (ref 0.0–0.7)
Eosinophils Absolute: 0 10*3/uL (ref 0.0–0.7)
Eosinophils Absolute: 0.1 10*3/uL (ref 0.0–0.7)
Eosinophils Absolute: 0.1 10*3/uL (ref 0.0–0.7)
Eosinophils Relative: 0 % (ref 0–5)
Eosinophils Relative: 0 % (ref 0–5)
Eosinophils Relative: 2 % (ref 0–5)
Eosinophils Relative: 2 % (ref 0–5)
Lymphocytes Relative: 33 % (ref 12–46)
Lymphocytes Relative: 35 % (ref 12–46)
Lymphocytes Relative: 44 % (ref 12–46)
Lymphocytes Relative: 6 % — ABNORMAL LOW (ref 12–46)
Lymphs Abs: 0.6 10*3/uL — ABNORMAL LOW (ref 0.7–4.0)
Lymphs Abs: 1.2 10*3/uL (ref 0.7–4.0)
Lymphs Abs: 1.7 10*3/uL (ref 0.7–4.0)
Lymphs Abs: 1.8 10*3/uL (ref 0.7–4.0)
Monocytes Absolute: 0.3 10*3/uL (ref 0.1–1.0)
Monocytes Absolute: 0.4 10*3/uL (ref 0.1–1.0)
Monocytes Absolute: 0.6 10*3/uL (ref 0.1–1.0)
Monocytes Absolute: 0.7 10*3/uL (ref 0.1–1.0)
Monocytes Relative: 12 % (ref 3–12)
Monocytes Relative: 14 % — ABNORMAL HIGH (ref 3–12)
Monocytes Relative: 16 % — ABNORMAL HIGH (ref 3–12)
Monocytes Relative: 3 % (ref 3–12)
Neutro Abs: 1.5 10*3/uL — ABNORMAL LOW (ref 1.7–7.7)
Neutro Abs: 1.9 10*3/uL (ref 1.7–7.7)
Neutro Abs: 2.5 10*3/uL (ref 1.7–7.7)
Neutro Abs: 9.3 10*3/uL — ABNORMAL HIGH (ref 1.7–7.7)
Neutrophils Relative %: 39 % — ABNORMAL LOW (ref 43–77)
Neutrophils Relative %: 48 % (ref 43–77)
Neutrophils Relative %: 54 % (ref 43–77)
Neutrophils Relative %: 90 % — ABNORMAL HIGH (ref 43–77)

## 2010-10-14 LAB — CARDIAC PANEL(CRET KIN+CKTOT+MB+TROPI)
CK, MB: 0.6 ng/mL (ref 0.3–4.0)
CK, MB: 0.6 ng/mL (ref 0.3–4.0)
CK, MB: 0.7 ng/mL (ref 0.3–4.0)
Relative Index: INVALID (ref 0.0–2.5)
Relative Index: INVALID (ref 0.0–2.5)
Relative Index: INVALID (ref 0.0–2.5)
Total CK: 75 U/L (ref 7–177)
Total CK: 76 U/L (ref 7–177)
Total CK: 78 U/L (ref 7–177)
Troponin I: 0.02 ng/mL (ref 0.00–0.06)
Troponin I: 0.02 ng/mL (ref 0.00–0.06)
Troponin I: <0.01 ng/mL (ref 0.00–0.06)

## 2010-10-14 LAB — OCCULT BLOOD (STOOL CUP TO LAB)
Fecal Occult Bld: POSITIVE
Fecal Occult Bld: POSITIVE

## 2010-10-14 LAB — BASIC METABOLIC PANEL
BUN: 1 mg/dL — ABNORMAL LOW (ref 6–23)
BUN: 17 mg/dL (ref 6–23)
BUN: 4 mg/dL — ABNORMAL LOW (ref 6–23)
CO2: 19 mEq/L (ref 19–32)
CO2: 21 mEq/L (ref 19–32)
CO2: 23 mEq/L (ref 19–32)
Calcium: 10.2 mg/dL (ref 8.4–10.5)
Calcium: 8.7 mg/dL (ref 8.4–10.5)
Calcium: 8.9 mg/dL (ref 8.4–10.5)
Chloride: 107 mEq/L (ref 96–112)
Chloride: 111 mEq/L (ref 96–112)
Chloride: 116 mEq/L — ABNORMAL HIGH (ref 96–112)
Creatinine, Ser: 0.72 mg/dL (ref 0.4–1.2)
Creatinine, Ser: 0.73 mg/dL (ref 0.4–1.2)
Creatinine, Ser: 0.9 mg/dL (ref 0.4–1.2)
GFR calc Af Amer: >60 mL/min (ref >60)
GFR calc Af Amer: >60 mL/min (ref >60)
GFR calc Af Amer: >60 mL/min (ref >60)
GFR calc non Af Amer: >60 mL/min (ref >60)
GFR calc non Af Amer: >60 mL/min (ref >60)
GFR calc non Af Amer: >60 mL/min (ref >60)
Glucose, Bld: 103 mg/dL — ABNORMAL HIGH (ref 70–99)
Glucose, Bld: 128 mg/dL — ABNORMAL HIGH (ref 70–99)
Glucose, Bld: 95 mg/dL (ref 70–99)
Potassium: 3.6 mEq/L (ref 3.5–5.1)
Potassium: 3.7 mEq/L (ref 3.5–5.1)
Potassium: 3.9 mEq/L (ref 3.5–5.1)
Sodium: 137 mEq/L (ref 135–145)
Sodium: 138 mEq/L (ref 135–145)
Sodium: 140 mEq/L (ref 135–145)

## 2010-10-14 LAB — URINE CULTURE: Colony Count: 45000

## 2010-10-14 LAB — POCT CARDIAC MARKERS
CKMB, poc: <1 ng/mL — ABNORMAL LOW (ref 1.0–8.0)
CKMB, poc: <1 ng/mL — ABNORMAL LOW (ref 1.0–8.0)
Myoglobin, poc: 103 ng/mL (ref 12–200)
Myoglobin, poc: 47.1 ng/mL (ref 12–200)
Troponin i, poc: <0.05 ng/mL (ref 0.00–0.09)
Troponin i, poc: <0.05 ng/mL (ref 0.00–0.09)

## 2010-10-14 LAB — HEPATITIS PANEL, ACUTE
HCV Ab: NEGATIVE
Hep A IgM: NEGATIVE
Hep B C IgM: NEGATIVE
Hepatitis B Surface Ag: NEGATIVE

## 2010-10-14 LAB — HEPATIC FUNCTION PANEL
ALT: 16 U/L (ref 0–35)
AST: 23 U/L (ref 0–37)
Albumin: 4.7 g/dL (ref 3.5–5.2)
Alkaline Phosphatase: 146 U/L — ABNORMAL HIGH (ref 39–117)
Bilirubin, Direct: 0.2 mg/dL (ref 0.0–0.3)
Indirect Bilirubin: 0.5 mg/dL (ref 0.3–0.9)
Total Bilirubin: 0.7 mg/dL (ref 0.3–1.2)
Total Protein: 8.4 g/dL — ABNORMAL HIGH (ref 6.0–8.3)

## 2010-10-14 LAB — TROPONIN I: Troponin I: <0.01 ng/mL (ref 0.00–0.06)

## 2010-10-14 LAB — STOOL CULTURE

## 2010-10-14 LAB — D-DIMER, QUANTITATIVE (NOT AT ARMC): D-Dimer, Quant: >20 ug/mL-FEU — ABNORMAL HIGH (ref 0.00–0.48)

## 2010-10-14 LAB — TSH: TSH: 1.697 u[IU]/mL (ref 0.350–4.500)

## 2010-10-14 LAB — LIPASE, BLOOD: Lipase: 33 U/L (ref 11–59)

## 2010-10-14 LAB — URINE MICROSCOPIC-ADD ON

## 2010-10-14 LAB — POTASSIUM: Potassium: 3.9 mEq/L (ref 3.5–5.1)

## 2010-10-14 LAB — PATHOLOGIST SMEAR REVIEW

## 2010-10-14 LAB — CK TOTAL AND CKMB (NOT AT ARMC)
CK, MB: 0.7 ng/mL (ref 0.3–4.0)
Relative Index: INVALID (ref 0.0–2.5)
Total CK: 33 U/L (ref 7–177)

## 2010-10-14 LAB — MAGNESIUM: Magnesium: 1.8 mg/dL (ref 1.5–2.5)

## 2010-10-14 LAB — T4, FREE: Free T4: 1.7 ng/dL (ref 0.80–1.80)

## 2010-10-15 ENCOUNTER — Encounter: Payer: Self-pay | Admitting: Internal Medicine

## 2010-10-17 ENCOUNTER — Encounter: Payer: Self-pay | Admitting: Internal Medicine

## 2010-10-17 ENCOUNTER — Other Ambulatory Visit (INDEPENDENT_AMBULATORY_CARE_PROVIDER_SITE_OTHER): Payer: 59

## 2010-10-17 ENCOUNTER — Ambulatory Visit (INDEPENDENT_AMBULATORY_CARE_PROVIDER_SITE_OTHER): Payer: 59 | Admitting: Internal Medicine

## 2010-10-17 ENCOUNTER — Telehealth: Payer: Self-pay | Admitting: Internal Medicine

## 2010-10-17 DIAGNOSIS — F329 Major depressive disorder, single episode, unspecified: Secondary | ICD-10-CM

## 2010-10-17 DIAGNOSIS — G43909 Migraine, unspecified, not intractable, without status migrainosus: Secondary | ICD-10-CM

## 2010-10-17 DIAGNOSIS — E039 Hypothyroidism, unspecified: Secondary | ICD-10-CM

## 2010-10-17 DIAGNOSIS — N952 Postmenopausal atrophic vaginitis: Secondary | ICD-10-CM

## 2010-10-17 DIAGNOSIS — F32A Depression, unspecified: Secondary | ICD-10-CM | POA: Insufficient documentation

## 2010-10-17 LAB — TSH: TSH: 0.97 u[IU]/mL (ref 0.35–5.50)

## 2010-10-17 MED ORDER — ESTROGENS, CONJUGATED 0.625 MG/GM VA CREA: 0.5000 g | TOPICAL_CREAM | VAGINAL | Status: DC

## 2010-10-17 MED ORDER — VENLAFAXINE HCL ER 37.5 MG PO CP24: 37.5000 mg | ORAL_CAPSULE | Freq: Every day | ORAL | Status: DC

## 2010-10-17 NOTE — Progress Notes (Signed)
  Subjective:    Patient ID: Melissa Kirby, female    DOB: [birth year], 50 y.o.   MRN: [id number]  HPI Here for follow up, reviewed chronic medical issues:  hypothyroid - prev followed with local endo for same -complicated hx reviewed. reports compliance with ongoing medical treatment and no recent medication dose change. denies adverse side effects related to current therapy but "doesn't feel right" - improved since change to gluten free diet. no recent weight loss, no skin changes  headaches/migraines - prev followed with neuro for same but not seen since stable symptoms. Now refill on meds here. has been on topamax >28mos and pamelor. ha worst with stress and when " thyroid is off"; improved symptoms with gluten free diet. ?refill topamax  insomina. worse with shift work schedule, better with new med for migraine (pamelor).  anemia, hx iron defic, also found to have B12 defic 09/2009 - status post eval by GI: EGD/colo 10/2009 negative for celiac dz but improved symptoms with diet changes - on b12 pills and monthly shots  GERD - reports compliance with ongoing medical treatment and no changes in medication dose or frequency. denies adverse side effects related to current therapy.  Past Medical History  Diagnosis Date  . ANEMIA, IRON DEFICIENCY 11/01/2009  . VITAMIN B12 DEFICIENCY 10/04/2009  . Unspecified hypothyroidism 10/03/2009  . MIGRAINE HEADACHE 10/03/2009    Review of Systems  Constitutional: Negative for fever and unexpected weight change.  Respiratory: Negative for shortness of breath.   Cardiovascular: Negative for chest pain.      Objective:   Physical Exam BP 92/78  Pulse 98  Temp(Src) 98.8 F (37.1 C) (Oral)  Ht 5' 2.5" (1.588 m)  Wt 119 lb (53.978 kg)  BMI 21.42 kg/m2 Physical Exam  Constitutional: She is oriented to person, place, and time. She appears well-developed and well-nourished. No distress.  Neck: Normal range of motion. Neck supple. No JVD present. No  thyromegaly present.  Cardiovascular: Normal rate, regular rhythm and normal heart sounds.  No murmur heard. Pulmonary/Chest: Effort normal and breath sounds normal. No respiratory distress. She has no wheezes.  Psychiatric: She has a normal mood and affect. Her behavior is normal. Judgment and thought content normal.   Lab Results  Component Value Date   WBC 5.4 11/01/2009   HGB 12.1 11/01/2009   HCT 34.5* 11/01/2009   PLT 236.0 11/01/2009   CHOL 178 10/03/2009   TRIG 71.0 10/03/2009   HDL 70.50 10/03/2009   ALT 17 11/01/2009   AST 22 11/01/2009   NA 140 11/01/2009   K 4.1 11/01/2009   CL 105 11/01/2009   CREATININE 0.7 11/01/2009   BUN 6 11/01/2009   CO2 27 11/01/2009   TSH 1.46 07/31/2010      Assessment & Plan:  See problem list. Medications and labs reviewed today.

## 2010-10-17 NOTE — Patient Instructions (Signed)
It was good to see you today. Test(s) ordered today. Your results will be called to you after review (48-72hours after test completion). If any changes need to be made, you will be notified at that time. Medications reviewed, will make the following changes at this time: Reduce dose topamax to 50mg  at bedtime Start generic effexor and premarin cream for menopausal symptoms Your prescription(s) have been submitted to your pharmacy. Please take as directed and contact our office if you believe you are having problem(s) with the medication(s). Please schedule followup in 2-3 months to review, call sooner if problems.

## 2010-10-17 NOTE — Telephone Encounter (Signed)
Called pt no ansew LMOM RTC concerning labs...10/17/10@2 :26pm/LMB

## 2010-10-17 NOTE — Assessment & Plan Note (Signed)
S/p total thyroidectomy 2000 The current medical regimen is effective;  continue present plan and medications. Lab Results  Component Value Date   TSH 1.46 07/31/2010

## 2010-10-17 NOTE — Assessment & Plan Note (Signed)
symptoms improved with gluten free diet - will wean off topamax as tolerated Dose reduction today -

## 2010-10-17 NOTE — Telephone Encounter (Signed)
Pt callback on Triage phone line. Spoke w/Pt and gave the message provided by Dr Felicity Coyer on test result.

## 2010-10-17 NOTE — Assessment & Plan Note (Signed)
Add topical premarin cream for dryness and dyspareunia symptoms

## 2010-10-17 NOTE — Telephone Encounter (Signed)
Please call patient - normal tsh results. No medication changes recommended. Thanks.  Lab Results  Component Value Date   TSH 0.97 10/17/2010

## 2010-12-03 ENCOUNTER — Encounter: Payer: Self-pay | Admitting: Internal Medicine

## 2010-12-05 ENCOUNTER — Encounter: Payer: Self-pay | Admitting: Internal Medicine

## 2010-12-19 ENCOUNTER — Other Ambulatory Visit (INDEPENDENT_AMBULATORY_CARE_PROVIDER_SITE_OTHER): Payer: 59

## 2010-12-19 ENCOUNTER — Encounter: Payer: Self-pay | Admitting: Internal Medicine

## 2010-12-19 ENCOUNTER — Ambulatory Visit (INDEPENDENT_AMBULATORY_CARE_PROVIDER_SITE_OTHER): Payer: 59 | Admitting: Internal Medicine

## 2010-12-19 VITALS — BP 90/72 | HR 73 | Temp 98.8°F | Ht 62.5 in | Wt 125.8 lb

## 2010-12-19 DIAGNOSIS — E039 Hypothyroidism, unspecified: Secondary | ICD-10-CM

## 2010-12-19 DIAGNOSIS — M5030 Other cervical disc degeneration, unspecified cervical region: Secondary | ICD-10-CM

## 2010-12-19 DIAGNOSIS — G43909 Migraine, unspecified, not intractable, without status migrainosus: Secondary | ICD-10-CM

## 2010-12-19 LAB — TSH: TSH: 5.11 u[IU]/mL (ref 0.35–5.50)

## 2010-12-19 LAB — T4, FREE: Free T4: 1.04 ng/dL (ref 0.60–1.60)

## 2010-12-19 MED ORDER — PREDNISONE (PAK) 10 MG PO TABS: 10.0000 mg | ORAL_TABLET | ORAL | Status: AC

## 2010-12-19 NOTE — Assessment & Plan Note (Signed)
symptoms improved with gluten free diet - successful wean off topamax completed 10/2010 Also off pamelor - doing well, continue same diet/stress control, meds as needed

## 2010-12-19 NOTE — Assessment & Plan Note (Signed)
S/p total thyroidectomy 2000 The current medical regimen is effective;  continue present plan and medications. Lab Results  Component Value Date   TSH 5.11 12/19/2010

## 2010-12-19 NOTE — Progress Notes (Signed)
  Subjective:    Patient ID: Melissa Kirby, female    DOB: 12-04-1960, 50 y.o.   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 reports compliance with ongoing medical treatment and no recent medication dose change. denies adverse side effects related to current therapy- improved since change to gluten free diet. no weight loss, no skin changes  headaches/migraines - prev followed with neuro for same but not seen since stablized symptoms. Weaned off topamax >32mos and off pamelor. ha worst with stress and when " thyroid is off"; improved symptoms with gluten free diet.   anemia, hx iron defic, also found to have B12 defic 09/2009 - status post eval by GI: EGD/colo 10/2009 negative for celiac dz but improved symptoms with diet changes - follows gluten free diet and takes B12 pills  GERD - reports compliance with ongoing medical treatment and no changes in medication dose or frequency. denies adverse side effects related to current therapy.  Past Medical History  Diagnosis Date  . ANEMIA, IRON DEFICIENCY 11/01/2009  . VITAMIN B12 DEFICIENCY 10/04/2009  . Unspecified hypothyroidism 10/03/2009  . MIGRAINE HEADACHE 10/03/2009  . GERD (gastroesophageal reflux disease)     Review of Systems  Constitutional: Negative for fever and unexpected weight change.  Respiratory: Negative for shortness of breath.   Cardiovascular: Negative for chest pain.      Objective:   Physical Exam BP 90/72  Pulse 73  Temp(Src) 98.8 F (37.1 C) (Oral)  Ht 5' 2.5" (1.588 m)  Wt 125 lb 12.8 oz (57.063 kg)  BMI 22.64 kg/m2   SpO2 98% Physical Exam  Constitutional: She is oriented to person, place, and time. She appears well-developed and well-nourished. No distress.  Neck: Normal range of motion but myofascial spasm over left neck/trapezius. Neck supple. No JVD present. No mass, thyroidectomy scar well healed Cardiovascular: Normal rate, regular rhythm and normal heart sounds.  No murmur heard. No BLE edema Pulmonary/Chest: Effort normal and breath sounds normal. No respiratory distress. She has no wheezes.  Neuro: CN2-12 symmetrically intact, 5/5 strength all extremities, normal gait, negative romberg Psychiatric: She has a normal mood and affect. Her behavior is normal. Judgment and thought content normal.   Lab Results  Component Value Date   WBC 5.4 11/01/2009   HGB 12.1 11/01/2009   HCT 34.5* 11/01/2009   PLT 236.0 11/01/2009   CHOL 178 10/03/2009   TRIG 71.0 10/03/2009   HDL 70.50 10/03/2009   ALT 17 11/01/2009   AST 22 11/01/2009   NA 140 11/01/2009   K 4.1 11/01/2009   CL 105 11/01/2009   CREATININE 0.7 11/01/2009   BUN 6 11/01/2009   CO2 27 11/01/2009   TSH 0.97 10/17/2010      Assessment & Plan:  See problem list. Medications and labs reviewed today.  Cervical DDD, known hx same - neuro exam benign but +muscle spasm - tx pred pak and refer to PT as before, pt declines muscle relaxer rx - to call if unimproved in next few weeks, sooner if worse

## 2010-12-19 NOTE — Patient Instructions (Signed)
It was good to see you today. Test(s) ordered today. Your results will be called to you after review (48-72hours after test completion). If any changes need to be made, you will be notified at that time. Prednisone for your neck/back and we'll make referral to [facility] physical therapy. Our office will contact you regarding appointment(s) once made. Your prescription(s) have been submitted to your pharmacy. Please take as directed and contact our office if you believe you are having problem(s) with the medication(s). Other Medications reviewed, no other changes at this time. Please schedule followup in 6 months, call sooner if problems.

## 2010-12-20 ENCOUNTER — Ambulatory Visit: Payer: 59 | Admitting: Internal Medicine

## 2010-12-26 ENCOUNTER — Ambulatory Visit (INDEPENDENT_AMBULATORY_CARE_PROVIDER_SITE_OTHER): Payer: 59 | Admitting: Internal Medicine

## 2010-12-26 ENCOUNTER — Ambulatory Visit: Payer: 59 | Admitting: Internal Medicine

## 2010-12-26 ENCOUNTER — Encounter: Payer: Self-pay | Admitting: Internal Medicine

## 2010-12-26 VITALS — BP 118/76 | HR 65 | Temp 98.0°F | Wt 126.0 lb

## 2010-12-26 DIAGNOSIS — N61 Mastitis without abscess: Secondary | ICD-10-CM

## 2010-12-26 MED ORDER — DOXYCYCLINE HYCLATE 100 MG PO TABS: 100.0000 mg | ORAL_TABLET | Freq: Two times a day (BID) | ORAL | Status: DC

## 2010-12-26 MED ORDER — NAPROXEN 500 MG PO TABS: 500.0000 mg | ORAL_TABLET | Freq: Two times a day (BID) | ORAL | Status: DC

## 2010-12-26 NOTE — Progress Notes (Signed)
  Subjective:    Patient ID: Melissa Kirby, female    DOB: 07/01/1961, 50 y.o.   MRN: [id number]  HPI  Here for left breast pain Onset 3 days ago  Pain progressive, tender to touch - pain radiating into L axilla No fever, no trauma Pain improved with warm compress last PM but not resolved finished pred pak for neck pain (last week) in last 24h  also reviewed chronic medical issues:  hypothyroid - prev followed with local endo for same -complicated hx reviewed. reports compliance with ongoing medical treatment and no recent medication dose change. denies adverse side effects related to current therapy- improved since change to gluten free diet. no weight loss, no skin changes  headaches/migraines - prev followed with neuro for same but not seen since stablized symptoms. Weaned off topamax >70mos and off pamelor. ha worst with stress and when " thyroid is off"; improved symptoms with gluten free diet.   anemia, hx iron defic, also found to have B12 defic 09/2009 - status post eval by GI: EGD/colo 10/2009 negative for celiac dz but improved symptoms with diet changes - follows gluten free diet and takes B12 pills  GERD - reports compliance with ongoing medical treatment and no changes in medication dose or frequency. denies adverse side effects related to current therapy.  Past Medical History  Diagnosis Date  . VITAMIN B12 DEFICIENCY 09/2009 dx  . MIGRAINE HEADACHE   . GERD (gastroesophageal reflux disease)   . ANEMIA, IRON DEFICIENCY   . Unspecified hypothyroidism 1982/2000    surgical resection of benign growth - partial then complete    Review of Systems  Constitutional: Negative for fever and unexpected weight change.  Respiratory: Negative for shortness of breath.   Cardiovascular: Negative for chest pain.      Objective:   Physical Exam BP 118/76  Pulse 65  Temp(Src) 98 F (36.7 C) (Oral)  Wt 126 lb (57.153 kg)  SpO2 98% Physical Exam  Constitutional: She is oriented to  person, place, and time. She appears well-developed and well-nourished. No distress.  Neck: Normal range of motion but myofascial spasm over left neck/trapezius. Neck supple. No JVD present. No mass, thyroidectomy scar well healed Cardiovascular: Normal rate, regular rhythm and normal heart sounds.  No murmur heard. No BLE edema Pulmonary/Chest: Effort normal and breath sounds normal. No respiratory distress. She has no wheezes.  L breast with tender area laterally at 3 o'clock, min erythema with mild L axilla LAD - no drainage or mass, no dimpling .   Lab Results  Component Value Date   WBC 5.4 11/01/2009   HGB 12.1 11/01/2009   HCT 34.5* 11/01/2009   PLT 236.0 11/01/2009   CHOL 178 10/03/2009   TRIG 71.0 10/03/2009   HDL 70.50 10/03/2009   ALT 17 11/01/2009   AST 22 11/01/2009   NA 140 11/01/2009   K 4.1 11/01/2009   CL 105 11/01/2009   CREATININE 0.7 11/01/2009   BUN 6 11/01/2009   CO2 27 11/01/2009   TSH 5.11 12/19/2010      Assessment & Plan:  See problem list. Medications and labs reviewed today.  L breast inflammation - suspect early mastitis due to infection - tx with doxy as well as NSAIDs - erx done x 7d; pt also overdue for screening mammo- she will call breast center to arrange upon resolution of this inflammation and pain - to call here if unimproved or worse - pt understands and agrees

## 2010-12-26 NOTE — Patient Instructions (Addendum)
It was good to see you today. Use doxycycline antibiotics and Naprosyn for inflammation - Your prescription(s) have been submitted to your pharmacy. Please take as directed and contact our office if you believe you are having problem(s) with the medication(s). Keep warm compress to left breast/armpit 3x/days next 2 days and then as needed Schedule your mammogram with the breast center after this has improved -  Call back here if continued problems or worsening symptoms

## 2010-12-27 ENCOUNTER — Telehealth: Payer: Self-pay

## 2010-12-27 NOTE — Telephone Encounter (Signed)
Pt called stating LT breast pain has increased and is radiating down left arm and back. Pt was advised to go to local ER ASAP. Pt agreed and states she will go to local ER

## 2010-12-27 NOTE — Telephone Encounter (Signed)
Noted, agree thanks

## 2010-12-28 ENCOUNTER — Emergency Department (HOSPITAL_COMMUNITY): Payer: 59

## 2010-12-28 ENCOUNTER — Emergency Department (HOSPITAL_COMMUNITY)
Admission: EM | Admit: 2010-12-28 | Discharge: 2010-12-28 | Disposition: A | Discharge status: Home / Self Care | Payer: 59 | Attending: Emergency Medicine | Admitting: Emergency Medicine

## 2010-12-28 DIAGNOSIS — E039 Hypothyroidism, unspecified: Secondary | ICD-10-CM | POA: Insufficient documentation

## 2010-12-28 DIAGNOSIS — R079 Chest pain, unspecified: Secondary | ICD-10-CM | POA: Insufficient documentation

## 2010-12-30 ENCOUNTER — Other Ambulatory Visit: Payer: Self-pay

## 2010-12-30 MED ORDER — OMEPRAZOLE 40 MG PO CPDR: 40.0000 mg | DELAYED_RELEASE_CAPSULE | Freq: Every day | ORAL | Status: DC

## 2010-12-30 MED ORDER — CELECOXIB 200 MG PO CAPS: 200.0000 mg | ORAL_CAPSULE | Freq: Every day | ORAL | Status: DC

## 2010-12-30 NOTE — Telephone Encounter (Signed)
Pt advised in detail but also states that her husband removed a tick from the back of her leg a few minutes prior to me calling. Pt advised to call back first thing in the morning for a same day appt with VAL.

## 2010-12-30 NOTE — Telephone Encounter (Signed)
Pt called stating she was seen in ER Saturday for left side CP/breast pain. Pt states that she is still having severe body pains/aches and indigestion and is requesting MD advise/prescribe medications to help.

## 2010-12-30 NOTE — Telephone Encounter (Signed)
Pt should start generic prilosec 40mg  qd (erx done) for indigestion - for pain, was given vicodin in ER - did that help? Will also erx celebrex to use daily in place of naprosyn (stop naprosyn) as antiinflam pain relief with less GI upset - thanks

## 2010-12-31 ENCOUNTER — Encounter: Payer: Self-pay | Admitting: *Deleted

## 2010-12-31 ENCOUNTER — Telehealth: Payer: Self-pay | Admitting: Internal Medicine

## 2010-12-31 ENCOUNTER — Ambulatory Visit (INDEPENDENT_AMBULATORY_CARE_PROVIDER_SITE_OTHER): Payer: 59 | Admitting: Internal Medicine

## 2010-12-31 ENCOUNTER — Other Ambulatory Visit (INDEPENDENT_AMBULATORY_CARE_PROVIDER_SITE_OTHER): Payer: 59

## 2010-12-31 ENCOUNTER — Encounter: Payer: Self-pay | Admitting: Internal Medicine

## 2010-12-31 DIAGNOSIS — E538 Deficiency of other specified B group vitamins: Secondary | ICD-10-CM

## 2010-12-31 DIAGNOSIS — R5383 Other fatigue: Secondary | ICD-10-CM

## 2010-12-31 DIAGNOSIS — R5381 Other malaise: Secondary | ICD-10-CM

## 2010-12-31 DIAGNOSIS — G43909 Migraine, unspecified, not intractable, without status migrainosus: Secondary | ICD-10-CM

## 2010-12-31 DIAGNOSIS — N61 Mastitis without abscess: Secondary | ICD-10-CM

## 2010-12-31 DIAGNOSIS — E039 Hypothyroidism, unspecified: Secondary | ICD-10-CM

## 2010-12-31 LAB — CBC WITH DIFFERENTIAL/PLATELET
Basophils Absolute: 0 10*3/uL (ref 0.0–0.1)
Basophils Relative: 0.6 % (ref 0.0–3.0)
Eosinophils Absolute: 0.2 10*3/uL (ref 0.0–0.7)
Eosinophils Relative: 2.1 % (ref 0.0–5.0)
HCT: 36.1 % (ref 36.0–46.0)
Hemoglobin: 12.2 g/dL (ref 12.0–15.0)
Lymphocytes Relative: 29 % (ref 12.0–46.0)
Lymphs Abs: 2.1 10*3/uL (ref 0.7–4.0)
MCHC: 33.8 g/dL (ref 30.0–36.0)
MCV: 91.7 fl (ref 78.0–100.0)
Monocytes Absolute: 0.8 10*3/uL (ref 0.1–1.0)
Monocytes Relative: 11.2 % (ref 3.0–12.0)
Neutro Abs: 4.2 10*3/uL (ref 1.4–7.7)
Neutrophils Relative %: 57.1 % (ref 43.0–77.0)
Platelets: 194 10*3/uL (ref 150.0–400.0)
RBC: 3.94 Mil/uL (ref 3.87–5.11)
RDW: 13 % (ref 11.5–14.6)
WBC: 7.4 10*3/uL (ref 4.5–10.5)

## 2010-12-31 LAB — TSH: TSH: 19.36 u[IU]/mL — ABNORMAL HIGH (ref 0.35–5.50)

## 2010-12-31 LAB — HEPATIC FUNCTION PANEL
ALT: 17 U/L (ref 0–35)
AST: 23 U/L (ref 0–37)
Albumin: 4.6 g/dL (ref 3.5–5.2)
Alkaline Phosphatase: 106 U/L (ref 39–117)
Bilirubin, Direct: 0.1 mg/dL (ref 0.0–0.3)
Total Bilirubin: 0.6 mg/dL (ref 0.3–1.2)
Total Protein: 7.9 g/dL (ref 6.0–8.3)

## 2010-12-31 LAB — BASIC METABOLIC PANEL
BUN: 13 mg/dL (ref 6–23)
CO2: 29 mEq/L (ref 19–32)
Calcium: 9.5 mg/dL (ref 8.4–10.5)
Chloride: 102 mEq/L (ref 96–112)
Creatinine, Ser: 0.9 mg/dL (ref 0.4–1.2)
GFR: 74.63 mL/min (ref >60.00)
Glucose, Bld: 89 mg/dL (ref 70–99)
Potassium: 4.2 mEq/L (ref 3.5–5.1)
Sodium: 137 mEq/L (ref 135–145)

## 2010-12-31 LAB — VITAMIN B12: Vitamin B-12: 335 pg/mL (ref 211–911)

## 2010-12-31 MED ORDER — LEVOTHYROXINE SODIUM 175 MCG PO TABS: 175.0000 ug | ORAL_TABLET | Freq: Every day | ORAL | Status: DC

## 2010-12-31 MED ORDER — DOXYCYCLINE HYCLATE 100 MG PO TABS: 100.0000 mg | ORAL_TABLET | Freq: Two times a day (BID) | ORAL | Status: AC

## 2010-12-31 NOTE — Assessment & Plan Note (Signed)
Lab Results  Component Value Date   VITAMINB12 >1500 pg/mL* 11/01/2009   On oral replacement rather than IM- recheck now

## 2010-12-31 NOTE — Progress Notes (Signed)
Subjective:    Patient ID: Melissa Kirby, female    DOB: 02/18/1961, 50 y.o.   MRN: [id number]  HPI  Here for fatigue  Seen here last week for left breast pain - tx with NSAIDS and doxy for mastitis - Then seen in ER 48h after for same - EKG, CXR unremarkable - added Vicodin to tx Then call for reflux/burn symptoms - added PPI and stopped naprosyn Now tick on LLE removed last 24h - Feels overwhelming fatigue and "sick", no energy L breast pain improved, min symptoms No fever, no nausea and vomiting; no vision changes 2 weeks ago treated with pred pak and muscle relaxer for neck pain/spasm (improved) - but now with back spasm  also reviewed chronic medical issues:  hypothyroid - prev followed with local endo for same -complicated hx reviewed. reports compliance with ongoing medical treatment and no recent medication dose change. denies adverse side effects related to current therapy- improved since change to gluten free diet. no weight loss, no skin changes  headaches/migraines - prev followed with neuro for same but not seen since stablized symptoms. Weaned off topamax >28mos and off pamelor. ha worst with stress and when " thyroid is off"; initially improved symptoms with gluten free diet. Now persisting low grade symptoms for last 2 weeks  anemia, hx iron defic, also found to have B12 defic 09/2009 - status post eval by GI: EGD/colo 10/2009 negative for celiac dz but improved symptoms with diet changes - follows gluten free diet and takes B12 pills  GERD - reports compliance with ongoing medical treatment and no changes in medication dose or frequency. denies adverse side effects related to current therapy.  Past Medical History  Diagnosis Date  . VITAMIN B12 DEFICIENCY 09/2009 dx  . MIGRAINE HEADACHE   . GERD (gastroesophageal reflux disease)   . ANEMIA, IRON DEFICIENCY   . Unspecified hypothyroidism 1982/2000    surgical resection of benign growth - partial then complete     Review of Systems  Constitutional: Positive for fatigue. Negative for fever and unexpected weight change.  Respiratory: Negative for shortness of breath.   Cardiovascular: Negative for chest pain.      Objective:   Physical Exam BP 92/70  Pulse 72  Temp(Src) 98.6 F (37 C) (Oral)  Ht 5\' 2"  (1.575 m)  Wt 125 lb 6.4 oz (56.881 kg)  BMI 22.94 kg/m2  SpO2 96% Physical Exam  Constitutional: She is oriented to person, place, and time. She appears well-developed and well-nourished. No distress but very fatigued appearing.  Neck: Normal range of motion but myofascial spasm over paraspinal region, supple. No JVD present. No mass, thyroidectomy scar well healed Cardiovascular: Normal rate, regular rhythm and normal heart sounds.  No murmur heard. No BLE edema Pulmonary/Chest: Effort normal and breath sounds normal. No respiratory distress. She has no wheezes.  Neuro: AAOx4, CN2-12 symmetrically intact  Lab Results  Component Value Date   WBC 5.4 11/01/2009   HGB 12.1 11/01/2009   HCT 34.5* 11/01/2009   PLT 236.0 11/01/2009   CHOL 178 10/03/2009   TRIG 71.0 10/03/2009   HDL 70.50 10/03/2009   ALT 17 11/01/2009   AST 22 11/01/2009   NA 140 11/01/2009   K 4.1 11/01/2009   CL 105 11/01/2009   CREATININE 0.7 11/01/2009   BUN 6 11/01/2009   CO2 27 11/01/2009   TSH 5.11 12/19/2010      Assessment & Plan:  See problem list. Medications and labs reviewed today.  L breast  inflammation - mastitis improved with doxy as well as NSAIDs -  also overdue for screening mammo- she will call breast center to arrange   Fatigue - overwhelming symptoms - may be related to recent acute illness/mskel injury but will check labs to screen for other med issues - if not revealing, given increase in migraine symptoms and nonspecific MRI brain 05/2009, consider repeat MRI brain   Tick exposure - empiric doxy x 1 week more  Work note for next 48h done per request

## 2010-12-31 NOTE — Telephone Encounter (Signed)
Notified pt with results & md recommendations. Pt states md gave excuse note for Thurs & Fri. Per her manager if she is out 3 consistent days with dr note will not count against her. Requesting another letter including 01/01/11 date. Printed excuse note waiting for signature.Marland KitchenMarland Kitchen6/12/12@4 :42pm/LMB

## 2010-12-31 NOTE — Assessment & Plan Note (Signed)
symptoms improved with gluten free diet - successful wean off topamax completed 10/2010 Also off pamelor -  initially doing well, relapse pain with med illness as described MRI brain 05/2009 reviewed - nonsp frontal lobe WM changes L>R  continue same diet/stress control, meds as needed Consider repeat eval if other med symptoms and labs unimproved

## 2010-12-31 NOTE — Patient Instructions (Signed)
It was good to see you today. continue doxycycline antibiotics x 1 week as discussed - Your prescription(s) have been submitted to your pharmacy. Please take as directed and contact our office if you believe you are having problem(s) with the medication(s). Test(s) ordered today. Your results will be called to you after review (48-72hours after test completion). If any changes need to be made, you will be notified at that time. Work note given x 2 days as requested

## 2010-12-31 NOTE — Telephone Encounter (Signed)
Please call patient - abnormal thyroid test results - need to increase synthroid to qd (not qod alternating with 150 as before)- new erx done. No other medication changes recommended. Also ordered MRI brain as we discussed at [facility] to arrange. Thanks.

## 2011-01-01 NOTE — Telephone Encounter (Signed)
Pt was notified excuse note ready for pick-up.Marland KitchenMarland Kitchen6/13/12@3 :55pm/LMB

## 2011-01-01 NOTE — Telephone Encounter (Signed)
Signed this AM and given to [facility]

## 2011-01-08 ENCOUNTER — Other Ambulatory Visit: Payer: 59

## 2011-01-10 ENCOUNTER — Ambulatory Visit
Admission: RE | Admit: 2011-01-10 | Discharge: 2011-01-10 | Disposition: A | Discharge status: Home / Self Care | Payer: 59 | Source: Ambulatory Visit | Attending: Internal Medicine | Admitting: Internal Medicine

## 2011-01-17 ENCOUNTER — Encounter: Payer: Self-pay | Admitting: Internal Medicine

## 2011-01-17 ENCOUNTER — Ambulatory Visit (INDEPENDENT_AMBULATORY_CARE_PROVIDER_SITE_OTHER): Payer: 59 | Admitting: Internal Medicine

## 2011-01-17 DIAGNOSIS — Z1239 Encounter for other screening for malignant neoplasm of breast: Secondary | ICD-10-CM

## 2011-01-17 DIAGNOSIS — N61 Mastitis without abscess: Secondary | ICD-10-CM

## 2011-01-17 DIAGNOSIS — R5383 Other fatigue: Secondary | ICD-10-CM | POA: Insufficient documentation

## 2011-01-17 DIAGNOSIS — E039 Hypothyroidism, unspecified: Secondary | ICD-10-CM

## 2011-01-17 DIAGNOSIS — R5381 Other malaise: Secondary | ICD-10-CM

## 2011-01-17 MED ORDER — NORTRIPTYLINE HCL 25 MG PO CAPS: 25.0000 mg | ORAL_CAPSULE | Freq: Every day | ORAL | Status: DC

## 2011-01-17 NOTE — Assessment & Plan Note (Signed)
S/p total thyroidectomy 2000 Dose adjustment 12/2010 due to inc TSH - recheck 6-12 weeks continue present plan and medications. Lab Results  Component Value Date   TSH 19.36* 12/31/2010

## 2011-01-17 NOTE — Patient Instructions (Signed)
It was good to see you today. Start nortriptyline as discussed - Your prescription(s) have been submitted to your pharmacy. Please take as directed and contact our office if you believe you are having problem(s) with the medication(s). we'll make referral for mammogram . Our office will contact you regarding appointment(s) once made. Please schedule followup in 4-6 weeks, call sooner if problems.

## 2011-01-17 NOTE — Assessment & Plan Note (Signed)
Fatigue - overwhelming symptoms since summer 2012 began 12/2010 labs to screen for other med issues prompted change in thyroid dose-  repeat MRI brain 12/2010 without evidence for demyelinating dz -  will resume nortriptyline as prev on same for migraine prophylaxis - will also cover tx for possible FM -  follow up 4-6 weeks, sooner if problems

## 2011-01-17 NOTE — Progress Notes (Signed)
  Subjective:    Patient ID: Melissa Kirby, female    DOB: May 09, 1961, 50 y.o.   MRN: [id number]  HPI  Here for fatigue Seen 2 weeks ago 6/12 for same - concerned for ?MS - MRI brain for same unremrkable Remote tx for ?FM with lyrica - med poorly tolerated ?resume nortriptyline - prev on same for migraine prophylaxis but off since 2011  also reviewed chronic medical issues:  hypothyroid - prev followed with local endo for same -complicated hx reviewed. reports compliance with ongoing medical treatment and no recent medication dose change. denies adverse side effects related to current therapy- improved since change to gluten free diet. no weight loss, no skin changes  headaches/migraines - prev followed with neuro for same but not seen since stablized symptoms. Weaned off topamax >31mos and off pamelor (see same). ha worst with stress and when " thyroid is off"; initially improved symptoms with gluten free diet. Now persisting low grade symptoms for last 2 weeks  anemia, hx iron defic, also found to have B12 defic 09/2009 - status post eval by GI: EGD/colo 10/2009 negative for celiac dz but improved symptoms with diet changes - follows gluten free diet and takes B12 pills  GERD - reports compliance with ongoing medical treatment and no changes in medication dose or frequency. denies adverse side effects related to current therapy.  Past Medical History  Diagnosis Date  . VITAMIN B12 DEFICIENCY 09/2009 dx  . MIGRAINE HEADACHE   . GERD (gastroesophageal reflux disease)   . ANEMIA, IRON DEFICIENCY   . Unspecified hypothyroidism 1982/2000    surgical resection of benign growth - partial then complete    Review of Systems  Constitutional: Positive for fatigue. Negative for fever and unexpected weight change.  Respiratory: Negative for shortness of breath.   Cardiovascular: Negative for chest pain.      Objective:   Physical Exam  BP 100/72  Pulse 69  Temp(Src) 98.6 F (37 C) (Oral)   Ht 5' 2.5" (1.588 m)  Wt 127 lb 6.4 oz (57.788 kg)  BMI 22.93 kg/m2  SpO2 96% Physical Exam  Constitutional: She is oriented to person, place, and time. She appears well-developed and well-nourished. No distress but very fatigued appearing.  Neck: Normal range of motion but myofascial spasm over paraspinal region, supple. No JVD present. No mass, thyroidectomy scar well healed Cardiovascular: Normal rate, regular rhythm and normal heart sounds.  No murmur heard. No BLE edema Pulmonary/Chest: Effort normal and breath sounds normal. No respiratory distress. She has no wheezes.  Neuro: AAOx4, CN2-12 symmetrically intact  Lab Results  Component Value Date   WBC 7.4 12/31/2010   HGB 12.2 12/31/2010   HCT 36.1 12/31/2010   PLT 194.0 12/31/2010   CHOL 178 10/03/2009   TRIG 71.0 10/03/2009   HDL 70.50 10/03/2009   ALT 17 12/31/2010   AST 23 12/31/2010   NA 137 12/31/2010   K 4.2 12/31/2010   CL 102 12/31/2010   CREATININE 0.9 12/31/2010   BUN 13 12/31/2010   CO2 29 12/31/2010   TSH 19.36* 12/31/2010      Assessment & Plan:  See problem list. Medications and labs reviewed today.  L breast inflammation 11/2010 - mastitis improved with doxy as well as NSAIDs - also overdue for screening mammo-  We will order at breast center

## 2011-01-23 ENCOUNTER — Ambulatory Visit
Admission: RE | Admit: 2011-01-23 | Discharge: 2011-01-23 | Disposition: A | Discharge status: Home / Self Care | Payer: 59 | Source: Ambulatory Visit | Attending: Internal Medicine | Admitting: Internal Medicine

## 2011-01-23 DIAGNOSIS — N61 Mastitis without abscess: Secondary | ICD-10-CM

## 2011-01-23 DIAGNOSIS — Z1239 Encounter for other screening for malignant neoplasm of breast: Secondary | ICD-10-CM

## 2011-02-10 ENCOUNTER — Encounter: Payer: Self-pay | Admitting: Internal Medicine

## 2011-02-10 ENCOUNTER — Ambulatory Visit (INDEPENDENT_AMBULATORY_CARE_PROVIDER_SITE_OTHER): Payer: 59 | Admitting: Internal Medicine

## 2011-02-10 ENCOUNTER — Other Ambulatory Visit (INDEPENDENT_AMBULATORY_CARE_PROVIDER_SITE_OTHER): Payer: 59

## 2011-02-10 DIAGNOSIS — F32A Depression, unspecified: Secondary | ICD-10-CM

## 2011-02-10 DIAGNOSIS — R5383 Other fatigue: Secondary | ICD-10-CM

## 2011-02-10 DIAGNOSIS — F329 Major depressive disorder, single episode, unspecified: Secondary | ICD-10-CM

## 2011-02-10 DIAGNOSIS — R5381 Other malaise: Secondary | ICD-10-CM

## 2011-02-10 DIAGNOSIS — E039 Hypothyroidism, unspecified: Secondary | ICD-10-CM

## 2011-02-10 LAB — TSH: TSH: 0.11 u[IU]/mL — ABNORMAL LOW (ref 0.35–5.50)

## 2011-02-10 MED ORDER — NORTRIPTYLINE HCL 50 MG PO CAPS: 50.0000 mg | ORAL_CAPSULE | Freq: Every day | ORAL | Status: DC

## 2011-02-10 NOTE — Assessment & Plan Note (Signed)
S/p total thyroidectomy 2000 Dose adjustment 12/2010 due to inc TSH - recheck now - titrate as needed  Lab Results  Component Value Date   TSH 19.36* 12/31/2010

## 2011-02-10 NOTE — Assessment & Plan Note (Signed)
Fatigue - overwhelming symptoms since summer 2012 began 12/2010 labs to screen for other med issues prompted change in thyroid dose- see above repeat MRI brain 12/2010 without evidence for demyelinating dz -  resumed nortriptyline 12/2010 as prev on same for migraine prophylaxis - titrate up now - also cover tx for possible FM as intol of lyrica -  Also refer behav health therapy/counseling for overlapping depression/anxiety symptoms - pt understands and agrees

## 2011-02-10 NOTE — Progress Notes (Signed)
  Subjective:    Patient ID: Melissa Kirby, female    DOB: 23-Mar-1961, 50 y.o.   MRN: [id number]  HPI  Here for fatigue -  Seen x 2 12/2010 for same - concern for MS -> MRI brain for same unremrkable Also flare FM - prev tx with lyrica - med poorly tolerated, resumed low dose nortriptyline 12/2010 - prev on same for migraine prophylaxis but off since 2011 - not improved yet +depression and stress - multifactorial  also reviewed chronic medical issues:  hypothyroid - prev followed with local endo for same -complicated hx reviewed. reports compliance with ongoing medical treatment and no recent medication dose change. denies adverse side effects related to current therapy.  no weight loss, no skin changes  headaches/migraines - prev followed with neuro for same but not seen since stablized symptoms. Weaned off topamax >35mos and off pamelor (until 12/2010). ha worst with stress and when " thyroid is off"; initially improved symptoms with gluten free diet. Now persisting low grade symptoms for last 6 weeks  anemia, hx iron defic, also found to have B12 defic 09/2009 - status post eval by GI: EGD/colo 10/2009 negative for celiac dz but improved symptoms with diet changes - follows gluten free diet and takes B12 pills  GERD - reports compliance with ongoing medical treatment and no changes in medication dose or frequency. denies adverse side effects related to current therapy.  Past Medical History  Diagnosis Date  . VITAMIN B12 DEFICIENCY 09/2009 dx  . MIGRAINE HEADACHE   . GERD (gastroesophageal reflux disease)   . ANEMIA, IRON DEFICIENCY   . Unspecified hypothyroidism 1982/2000    surgical resection of benign growth - partial then complete    Review of Systems  Constitutional: Positive for fatigue. Negative for fever and unexpected weight change.  Respiratory: Negative for shortness of breath.   Cardiovascular: Negative for chest pain.      Objective:   Physical Exam  BP 102/78  Pulse  88  Temp(Src) 98.8 F (37.1 C) (Oral)  Ht 5' 2.5" (1.588 m)  Wt 125 lb 6.4 oz (56.881 kg)  BMI 22.57 kg/m2  SpO2 97%  Constitutional: She is oriented to person, place, and time. She appears well-developed and well-nourished. No distress but very fatigued appearing. spouse at side Neck: Normal range of motion, supple. No JVD present. No mass, thyroidectomy scar well healed Cardiovascular: Normal rate, regular rhythm and normal heart sounds.  No murmur heard. No BLE edema Pulmonary/Chest: Effort normal and breath sounds normal. No respiratory distress. She has no wheezes.  Neuro: AAOx4, CN2-12 symmetrically intact - MAE with good strength and coordination Psyc: anxious, occ tearful, depressed appearing -   Lab Results  Component Value Date   WBC 7.4 12/31/2010   HGB 12.2 12/31/2010   HCT 36.1 12/31/2010   PLT 194.0 12/31/2010   CHOL 178 10/03/2009   TRIG 71.0 10/03/2009   HDL 70.50 10/03/2009   ALT 17 12/31/2010   AST 23 12/31/2010   NA 137 12/31/2010   K 4.2 12/31/2010   CL 102 12/31/2010   CREATININE 0.9 12/31/2010   BUN 13 12/31/2010   CO2 29 12/31/2010   TSH 19.36* 12/31/2010      Assessment & Plan:  See problem list. Medications and labs reviewed today.

## 2011-02-10 NOTE — Patient Instructions (Signed)
It was good to see you today. Test(s) ordered today. Your results will be called to you after review (48-72hours after test completion). If any changes need to be made, you will be notified at that time. Increase Pamelor to 50mg  at bedtime we'll make referral to therapy as discussed. Our office will contact you regarding appointment(s) once made. Please schedule followup in 4-6 weeks to reveiw, call sooner if problems.

## 2011-02-19 ENCOUNTER — Ambulatory Visit (INDEPENDENT_AMBULATORY_CARE_PROVIDER_SITE_OTHER): Payer: 59 | Admitting: Licensed Clinical Social Worker

## 2011-02-19 DIAGNOSIS — F4322 Adjustment disorder with anxiety: Secondary | ICD-10-CM

## 2011-02-25 ENCOUNTER — Ambulatory Visit (INDEPENDENT_AMBULATORY_CARE_PROVIDER_SITE_OTHER): Payer: 59 | Admitting: Licensed Clinical Social Worker

## 2011-02-25 DIAGNOSIS — F4322 Adjustment disorder with anxiety: Secondary | ICD-10-CM

## 2011-02-28 ENCOUNTER — Ambulatory Visit: Payer: 59 | Admitting: Internal Medicine

## 2011-03-03 ENCOUNTER — Ambulatory Visit (INDEPENDENT_AMBULATORY_CARE_PROVIDER_SITE_OTHER): Payer: 59 | Admitting: Licensed Clinical Social Worker

## 2011-03-03 DIAGNOSIS — F4322 Adjustment disorder with anxiety: Secondary | ICD-10-CM

## 2011-03-10 ENCOUNTER — Other Ambulatory Visit (INDEPENDENT_AMBULATORY_CARE_PROVIDER_SITE_OTHER): Payer: 59

## 2011-03-10 ENCOUNTER — Ambulatory Visit (INDEPENDENT_AMBULATORY_CARE_PROVIDER_SITE_OTHER): Payer: 59 | Admitting: Internal Medicine

## 2011-03-10 ENCOUNTER — Encounter: Payer: Self-pay | Admitting: Internal Medicine

## 2011-03-10 DIAGNOSIS — E039 Hypothyroidism, unspecified: Secondary | ICD-10-CM

## 2011-03-10 DIAGNOSIS — F32A Depression, unspecified: Secondary | ICD-10-CM

## 2011-03-10 DIAGNOSIS — F329 Major depressive disorder, single episode, unspecified: Secondary | ICD-10-CM

## 2011-03-10 LAB — TSH: TSH: 0.04 u[IU]/mL — ABNORMAL LOW (ref 0.35–5.50)

## 2011-03-10 MED ORDER — LEVOTHYROXINE SODIUM 150 MCG PO TABS: 150.0000 ug | ORAL_TABLET | ORAL | Status: DC

## 2011-03-10 NOTE — Progress Notes (Signed)
  Subjective:    Patient ID: Melissa Kirby, female    DOB: Dec 22, 1960, 50 y.o.   MRN: [id number]  HPI    Review of Systems     Objective:   Physical Exam        Assessment & Plan:    Orders only encounter

## 2011-03-10 NOTE — Patient Instructions (Signed)
It was good to see you today. Test(s) ordered today. Your results will be called to you after review (48-72hours after test completion). If any changes need to be made, you will be notified at that time. continue Pamelor to 50mg  at bedtime and ok to try melatonin or ZZZquil Continue counseling therapy as discussed. Please schedule followup in 3 months to review symptoms and medications, call sooner if problems.

## 2011-03-10 NOTE — Progress Notes (Signed)
  Subjective:    Patient ID: Melissa Kirby, female    DOB: September 14, 1960, 50 y.o.   MRN: [id number]  HPI Here for follow up - reviewed chronic medical issues:  Depression - manifest as fatigue - seen x 2 12/2010 and again 01/2011 for same -  ?concern for MS in 12/2010 -> MRI brain for same unremrkable Also complicated by flare FM - prev tx with lyrica - med poorly tolerated,  resumed low dose nortriptyline 12/2010, dose increased 01/2011 - prev on same for migraine prophylaxis but off since 2011 -  Felling improved, especially with counseling ([facility] q2weeks)  hypothyroid - prev followed with local endo for same -complicated hx reviewed. reports compliance with ongoing medical treatment and no recent medication dose change. denies adverse side effects related to current therapy.  no weight loss, no skin changes  headaches/migraines - prev followed with neuro for same but not seen since stablized symptoms. Weaned off topamax >62mos and off pamelor (until 12/2010). headache worst with stress and when " thyroid is off"; initially improved symptoms with gluten free diet. Now persisting low grade symptoms for last 2 months  anemia, hx iron defic, also found to have B12 defic 09/2009 - status post eval by GI: EGD/[facility] 10/2009 negative for celiac dz but improved symptoms with diet changes - follows gluten free diet and takes B12 pills  GERD - reports compliance with ongoing medical treatment and no changes in medication dose or frequency. denies adverse side effects related to current therapy.  Past Medical History  Diagnosis Date  . VITAMIN B12 DEFICIENCY 09/2009 dx  . MIGRAINE HEADACHE   . GERD (gastroesophageal reflux disease)   . ANEMIA, IRON DEFICIENCY   . Unspecified hypothyroidism 1982/2000    surgical resection of benign growth - partial then complete    Review of Systems  Constitutional: Positive for fatigue. Negative for fever and unexpected weight change.  Respiratory: Negative for  shortness of breath.   Cardiovascular: Negative for chest pain.      Objective:   Physical Exam  BP 100/72  Pulse 105  Temp(Src) 97.9 F (36.6 C) (Oral)  Ht 5' 2.5" (1.588 m)  Wt 128 lb (58.06 kg)  BMI 23.04 kg/m2  SpO2 97%  Constitutional: She is oriented to person, place, and time. She appears well-developed and well-nourished. No distress, less fatigued appearing.  Neck: Normal range of motion, supple. No JVD present. No mass, thyroidectomy scar well healed Cardiovascular: Normal rate, regular rhythm and normal heart sounds.  No murmur heard. No BLE edema Pulmonary/Chest: Effort normal and breath sounds normal. No respiratory distress. She has no wheezes.  Neuro: AAOx4, CN2-12 symmetrically intact - MAE with good strength and coordination Psyc: minimally anxious and depressed appearing -   Lab Results  Component Value Date   WBC 7.4 12/31/2010   HGB 12.2 12/31/2010   HCT 36.1 12/31/2010   PLT 194.0 12/31/2010   CHOL 178 10/03/2009   TRIG 71.0 10/03/2009   HDL 70.50 10/03/2009   ALT 17 12/31/2010   AST 23 12/31/2010   NA 137 12/31/2010   K 4.2 12/31/2010   CL 102 12/31/2010   CREATININE 0.9 12/31/2010   BUN 13 12/31/2010   CO2 29 12/31/2010   TSH 0.11* 02/10/2011      Assessment & Plan:  See problem list. Medications and labs reviewed today.

## 2011-03-10 NOTE — Assessment & Plan Note (Signed)
Improving with counseling (bond at [facility]) and pamelor - last dose increase 01/2011 Support offered - follow up 3 months, sooner if problems

## 2011-03-10 NOTE — Assessment & Plan Note (Signed)
S/p total thyroidectomy 2000 Dose adjustment 12/2010 due to inc TSH - then slightly low 01/2011 but no dose change recheck now - titrate as needed  Lab Results  Component Value Date   TSH 0.11* 02/10/2011

## 2011-03-19 ENCOUNTER — Ambulatory Visit (INDEPENDENT_AMBULATORY_CARE_PROVIDER_SITE_OTHER): Payer: 59 | Admitting: Licensed Clinical Social Worker

## 2011-03-19 DIAGNOSIS — F4322 Adjustment disorder with anxiety: Secondary | ICD-10-CM

## 2011-03-28 ENCOUNTER — Ambulatory Visit (INDEPENDENT_AMBULATORY_CARE_PROVIDER_SITE_OTHER): Payer: 59 | Admitting: Licensed Clinical Social Worker

## 2011-03-28 DIAGNOSIS — F4322 Adjustment disorder with anxiety: Secondary | ICD-10-CM

## 2011-04-07 ENCOUNTER — Ambulatory Visit (INDEPENDENT_AMBULATORY_CARE_PROVIDER_SITE_OTHER): Payer: 59 | Admitting: Licensed Clinical Social Worker

## 2011-04-07 DIAGNOSIS — F4322 Adjustment disorder with anxiety: Secondary | ICD-10-CM

## 2011-04-22 ENCOUNTER — Ambulatory Visit (INDEPENDENT_AMBULATORY_CARE_PROVIDER_SITE_OTHER): Payer: 59 | Admitting: Internal Medicine

## 2011-04-22 ENCOUNTER — Encounter: Payer: Self-pay | Admitting: Internal Medicine

## 2011-04-22 ENCOUNTER — Ambulatory Visit (INDEPENDENT_AMBULATORY_CARE_PROVIDER_SITE_OTHER)
Admission: RE | Admit: 2011-04-22 | Discharge: 2011-04-22 | Disposition: A | Discharge status: Home / Self Care | Payer: 59 | Source: Ambulatory Visit | Attending: Internal Medicine | Admitting: Internal Medicine

## 2011-04-22 VITALS — BP 120/78 | HR 105 | Temp 98.6°F | Ht 62.0 in

## 2011-04-22 DIAGNOSIS — M25579 Pain in unspecified ankle and joints of unspecified foot: Secondary | ICD-10-CM

## 2011-04-22 DIAGNOSIS — M25572 Pain in left ankle and joints of left foot: Secondary | ICD-10-CM

## 2011-04-22 DIAGNOSIS — B001 Herpesviral vesicular dermatitis: Secondary | ICD-10-CM

## 2011-04-22 DIAGNOSIS — B009 Herpesviral infection, unspecified: Secondary | ICD-10-CM

## 2011-04-22 DIAGNOSIS — G43909 Migraine, unspecified, not intractable, without status migrainosus: Secondary | ICD-10-CM

## 2011-04-22 MED ORDER — MELOXICAM 15 MG PO TABS: 15.0000 mg | ORAL_TABLET | Freq: Every day | ORAL | Status: DC

## 2011-04-22 MED ORDER — VALACYCLOVIR HCL 1 G PO TABS: 1000.0000 mg | ORAL_TABLET | ORAL | Status: DC | PRN

## 2011-04-22 NOTE — Patient Instructions (Signed)
It was good to see you today. Test(s) ordered today. Your results will be called to you after review (48-72hours after test completion). If any changes need to be made, you will be notified at that time. meloxicam for ankle pain and Valtrex for cold sore - Your prescription(s) have been submitted to your pharmacy. Please take as directed and contact our office if you believe you are having problem(s) with the medication(s). Will work on intermittent FMLA forms for migraines as discussed - bring the forms to our office and we will call you when done

## 2011-04-22 NOTE — Progress Notes (Signed)
  Subjective:    Patient ID: Melissa Kirby, female    DOB: 1960-08-11, 50 y.o.   MRN: [id number]  HPI  complains of left ankle pain Onset 2 weeks ago Located lateral and anterior side of ankle Denies swelling or color/skin changes No precipitating injury, trauma or fall No other joints affected Pain worse with prolonged standing activity No change in footware or activity Not releived with OTC NSAIDs or heat pak prn  Also request tx for cold sores - onset 3 days ago, hx same  Also request "partial" FMLA for time missed due to migraines - no paperwork with pt today  Past Medical History  Diagnosis Date  . VITAMIN B12 DEFICIENCY 09/2009 dx  . MIGRAINE HEADACHE   . GERD (gastroesophageal reflux disease)   . ANEMIA, IRON DEFICIENCY   . Unspecified hypothyroidism 1982/2000    surgical resection of benign growth - partial then complete    Review of Systems  Constitutional: Negative for fever and unexpected weight change.  Musculoskeletal: Negative for back pain and gait problem.  Hematological: Negative for adenopathy. Does not bruise/bleed easily.       Objective:   Physical Exam BP 120/78  Pulse 105  Temp(Src) 98.6 F (37 C) (Oral)  Ht 5\' 2"  (1.575 m)  SpO2 96% Constitutional: She is well-developed and well-nourished. No distress.  Neck: Normal range of motion. Neck supple. No JVD present. No thyromegaly present.  Cardiovascular: Normal rate, regular rhythm and normal heart sounds.  No murmur heard. No BLE edema. Pulmonary/Chest: Effort normal and breath sounds normal. No respiratory distress. She has no wheezes.  Musculoskeletal: L ankle: Normal active and passive range of motion, no joint effusions. No gross deformities - min tender to direct palpation over area on anterior side of lateral ankle Neurological: She is alert and oriented to person, place, and time. No cranial nerve deficit. Coordination normal.  Skin: Skin is warm and dry. No rash noted. No erythema. +lower  lip with HSV2 changes Psychiatric: She has a normal mood and affect. Her behavior is normal. Judgment and thought content normal.       Assessment & Plan:   L ankle pain - suspect tendonitis - check xray and tx rx NSAIDs - advised on proper arch support and to avoid impact sports (running, etc) until pain improved - to call if worse/unimporved for ortho eval prn  Cold sore - Valtrex prn - erx done  Migraine hx - will complete intermittent FMLA when forms available

## 2011-04-25 ENCOUNTER — Ambulatory Visit: Payer: 59 | Admitting: Licensed Clinical Social Worker

## 2011-04-29 ENCOUNTER — Telehealth: Payer: Self-pay

## 2011-04-29 NOTE — Telephone Encounter (Signed)
Pt called requesting status of FMLA paperwork.

## 2011-04-29 NOTE — Telephone Encounter (Signed)
Previously given to [facility] - please check with her - thanks

## 2011-04-30 DIAGNOSIS — Z0279 Encounter for issue of other medical certificate: Secondary | ICD-10-CM

## 2011-05-01 ENCOUNTER — Telehealth: Payer: Self-pay

## 2011-05-01 DIAGNOSIS — M25572 Pain in left ankle and joints of left foot: Secondary | ICD-10-CM

## 2011-05-01 NOTE — Telephone Encounter (Signed)
Pt called requesting referral to Ortho for ankle pain. Pt was seen by Dr Felicity Coyer and advised to call back for referral if pain did not improve.

## 2011-05-01 NOTE — Telephone Encounter (Signed)
Done

## 2011-05-02 NOTE — Telephone Encounter (Signed)
Pt informed

## 2011-05-12 ENCOUNTER — Other Ambulatory Visit: Payer: Self-pay | Admitting: Sports Medicine

## 2011-05-12 DIAGNOSIS — M5416 Radiculopathy, lumbar region: Secondary | ICD-10-CM

## 2011-05-14 ENCOUNTER — Ambulatory Visit
Admission: RE | Admit: 2011-05-14 | Discharge: 2011-05-14 | Disposition: A | Discharge status: Home / Self Care | Payer: 59 | Source: Ambulatory Visit | Attending: Sports Medicine | Admitting: Sports Medicine

## 2011-05-14 ENCOUNTER — Ambulatory Visit (INDEPENDENT_AMBULATORY_CARE_PROVIDER_SITE_OTHER): Payer: 59 | Admitting: Licensed Clinical Social Worker

## 2011-05-14 DIAGNOSIS — F4322 Adjustment disorder with anxiety: Secondary | ICD-10-CM

## 2011-05-14 DIAGNOSIS — M5416 Radiculopathy, lumbar region: Secondary | ICD-10-CM

## 2011-05-20 ENCOUNTER — Ambulatory Visit (INDEPENDENT_AMBULATORY_CARE_PROVIDER_SITE_OTHER): Payer: 59 | Admitting: Licensed Clinical Social Worker

## 2011-05-20 DIAGNOSIS — F4322 Adjustment disorder with anxiety: Secondary | ICD-10-CM

## 2011-05-26 ENCOUNTER — Ambulatory Visit (HOSPITAL_COMMUNITY)
Admission: RE | Admit: 2011-05-26 | Discharge: 2011-05-26 | Disposition: A | Discharge status: Home / Self Care | Payer: 59 | Source: Ambulatory Visit | Attending: Sports Medicine | Admitting: Sports Medicine

## 2011-05-26 DIAGNOSIS — M545 Low back pain, unspecified: Secondary | ICD-10-CM | POA: Insufficient documentation

## 2011-05-26 DIAGNOSIS — M6281 Muscle weakness (generalized): Secondary | ICD-10-CM | POA: Insufficient documentation

## 2011-05-26 DIAGNOSIS — R29898 Other symptoms and signs involving the musculoskeletal system: Secondary | ICD-10-CM | POA: Insufficient documentation

## 2011-05-26 DIAGNOSIS — IMO0001 Reserved for inherently not codable concepts without codable children: Secondary | ICD-10-CM | POA: Insufficient documentation

## 2011-05-26 DIAGNOSIS — M79609 Pain in unspecified limb: Secondary | ICD-10-CM | POA: Insufficient documentation

## 2011-05-26 NOTE — Progress Notes (Signed)
Physical Therapy Evaluation  Patient Details  Name: Melissa Kirby MRN: [id number] Date of Birth: 1960-12-26  Today's Date: 05/26/2011 Time: 1115-1200 Time Calculation (min): 45 min Visit#: 1  of 12   Re-eval: 06/25/11    Past Medical History:  Past Medical History  Diagnosis Date  . VITAMIN B12 DEFICIENCY 09/2009 dx  . MIGRAINE HEADACHE   . GERD (gastroesophageal reflux disease)   . ANEMIA, IRON DEFICIENCY   . Unspecified hypothyroidism 1982/2000    surgical resection of benign growth - partial then complete   Past Surgical History:  Past Surgical History  Procedure Date  . Tubal ligation 1989  . Cholecystectomy 2009  . Total thyroidectomy 2000    Had thyroid nodule, nonmalignant (4098-1191) resection    Subjective Symptoms/Limitations Symptoms: The patient states that she has had back problems that come and go for years.  She states that this episode started about a month ago. She states that her pain started with her left foot being numb. The patient states that her back started bothering her approximately two weeks later.  The patient states that an MRI was ordered which showed a bulge disc at L4-5.  She recieved a prednisone pack and an injection on 11/2.  The pateint states she is worse since she had the injeciton.  The patient states that the only way she gets total relief is by lying flat and lying on her stomach is better than lying on her back. The patient states that she has had therapy before including cupping, traction and  Exercises. The patient states that cupping gave her the most relief followed by traction.   She is currentlly out  of work and being referred to therapy to return the patient to prior functional level.  Limitations: Sitting;Lifting;Standing;Walking How long can you sit comfortably?: The patient can sit for five minutes only prior to having increased back pain. How long can you stand comfortably?: The patient tends to stand on her right leg only.  If  she is standing on both legs equally then she can only  stand for ten minutes. How long can you walk comfortably?: The patient states she can walk better than sitting or standing she can walk for about 15 minutes but she has limited walking due to the numbness in  her foot.   Pain Assessment Currently in Pain?: Yes Pain Score:   2 Pain Location: Back Pain Orientation: Left Pain Type: Chronic pain Pain Radiating Towards: into ankle- leg pain is at a 6 Pain Onset: 1 to 4 weeks ago Pain Frequency: Constant Pain Relieving Factors: lying flat on her stomach.   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 Leisure: Hobbies-yes (Comment) Comments: walking, hunting.  Sensation/Coordination/Flexibility- tight hamstrings bilaterally with L PSLR at 60 and R at 75.   Objective: RLE Strength Right Hip Flexion: 5/5 Right Hip Extension: 5/5 Right Hip ABduction: 5/5 Right Hip ADduction: 5/5 Right Knee Flexion: 5/5 Right Knee Extension: 5/5 Right Ankle Dorsiflexion: 5/5 LLE Strength Left Hip Flexion: 4/5 Left Hip Extension: 4/5 Left Hip ABduction: 4/5 Left Hip ADduction: 4/5 Left Knee Flexion: 4/5 Left Knee Extension: 4/5 Left Ankle Dorsiflexion: 5/5 Lumbar AROM Lumbar Flexion: wnl with  Lumbar Extension: wnl with increased pain. Lumbar - Right Side Bend: wnl Lumbar - Left Side Bend: wnl  Lumbar - Right Rotation: wnl Lumbar - Left Rotation: wnl  Exercise/Treatments Stretches Active Hamstring Stretch: 3 reps;30 seconds Single Knee  to Chest Stretch: 3 reps;30 seconds Prone on Elbows Stretch: 3 reps;20 seconds;Limitations Piriformis Stretch: 1 rep;60 seconds;Limitations Piriformis Stretch Limitations: all fours     Physical Therapy Assessment and Plan PT Assessment and Plan Clinical Impression Statement: Patient with radicular pain, positve bulge disc who will benefit from skilled therapy  to return to work and prior functinal activites. Rehab Potential: Good Clinical Impairments Affecting Rehab Potential: decreased strength, pain PT Frequency: Min 3X/week PT Treatment/Interventions: Patient/family education;Therapeutic exercise;Other (comment) (traction.) PT Plan: see three times/ week.  Next treatment add TM, begin stab exercises including ab set, bent knee raise, bridge, clam and sitting IR/ER add prone pelvic traction to regeime.    Goals Home Exercise Program Pt will Perform Home Exercise Program: Independently PT Short Term Goals Time to Complete Short Term Goals: 2 weeks PT Short Term Goal 1: Pt pain to be radiating no further than knee level. PT Short Term Goal 2: Leg pain to be no greater than a 4 PT Short Term Goal 3: Pt to be able to verbalize and demonstrate good body mechanics. PT Long Term Goals PT Long Term Goal 1: I in advance HEP PT Long Term Goal 2: no radiating pain Long Term Goal 3: Pt to be able to return to work activity without increased leg pain.  Problem List Patient Active Problem List  Diagnoses  . Unspecified hypothyroidism  . VITAMIN B12 DEFICIENCY  . ANEMIA, IRON DEFICIENCY  . ANEMIA-NOS  . CIRCADIAN RHYTHM SLEEP DISORDER SHIFT WORK TYPE  . MIGRAINE HEADACHE  . GASTRITIS  . GI BLEEDING  . LOSS OF APPETITE  . DYSURIA  . Atrophic vaginitis  . Depression  . Fatigue  . Left ankle pain  . Left leg weakness    PT - End of Session Activity Tolerance: Patient tolerated treatment well General Behavior During Session: [facility] for tasks performed   Sydell Prowell,CINDY 05/26/2011, 12:33 PM  Physician Documentation Your signature is required to indicate approval of the treatment plan as stated above.  Please sign and either send electronically or make a copy of this report for your files and return this physician signed original.   Please mark one 1.__approve of plan  2. ___approve of plan with the following  conditions.   ______________________________                                                          _____________________ Physician Signature                                                                                                             Date

## 2011-05-26 NOTE — Patient Instructions (Addendum)
HEP

## 2011-05-28 ENCOUNTER — Ambulatory Visit (HOSPITAL_COMMUNITY)
Admission: RE | Admit: 2011-05-28 | Discharge: 2011-05-28 | Disposition: A | Discharge status: Home / Self Care | Payer: 59 | Source: Ambulatory Visit | Attending: Internal Medicine | Admitting: Internal Medicine

## 2011-05-28 DIAGNOSIS — R29898 Other symptoms and signs involving the musculoskeletal system: Secondary | ICD-10-CM

## 2011-05-28 NOTE — Progress Notes (Signed)
Physical Therapy Treatment Patient Details  Name: Melissa Kirby MRN: [id number] Date of Birth: 1961/06/04  Today's Date: 05/28/2011 Time: 4098-1191 Time Calculation (min): 44 min Visit#: 2  of 12   Re-eval: 06/25/11  charge:  There ex 26; IPTX  Subjective: Symptoms/Limitations Symptoms: Pt states that she was very sore with stretching exercises. Pain Assessment Currently in Pain?: Yes Pain Score:   3 Pain Location: Back Pain Orientation: Left       Exercise/Treatments Stretches ITB Stretch: Limitations ITB Stretch Limitations: sitting IR/ER of hipsx3 Lumbar Exercises   Stability Clam: 5 reps Bridge: 5 reps Bent Knee Raise: 5 reps Ab Set: 5 reps   Modalities Modalities: Traction Traction Type of Traction: Lumbar (prone.) Min (lbs): 35# Max (lbs): 50# Hold Time: 45 Rest Time: 10 Time: 15  Physical Therapy Assessment and Plan PT Assessment and Plan Clinical Impairments Affecting Rehab Potential: Pain, decreased strength PT Plan: If pt does well with prone traction begin static traction with 50# for 10 minutes next treatment.  Begin prone stab; heel squeeze, SAR, SLR    Goals  No radicular pain  Problem List Patient Active Problem List  Diagnoses  . Unspecified hypothyroidism  . VITAMIN B12 DEFICIENCY  . ANEMIA, IRON DEFICIENCY  . ANEMIA-NOS  . CIRCADIAN RHYTHM SLEEP DISORDER SHIFT WORK TYPE  . MIGRAINE HEADACHE  . GASTRITIS  . GI BLEEDING  . LOSS OF APPETITE  . DYSURIA  . Atrophic vaginitis  . Depression  . Fatigue  . Left ankle pain  . Left leg weakness    PT - End of Session Activity Tolerance: Patient tolerated treatment well General Behavior During Session: [facility] for tasks performed Cognition: [facility] for tasks performed  Aubreyana Saltz,CINDY 05/28/2011, 9:33 AM

## 2011-05-28 NOTE — Patient Instructions (Signed)
Stab exercises.

## 2011-05-30 ENCOUNTER — Ambulatory Visit (HOSPITAL_COMMUNITY)
Admission: RE | Admit: 2011-05-30 | Discharge: 2011-05-30 | Disposition: A | Discharge status: Home / Self Care | Payer: 59 | Source: Ambulatory Visit | Attending: Internal Medicine | Admitting: Internal Medicine

## 2011-05-30 NOTE — Progress Notes (Signed)
Physical Therapy Treatment Patient Details  Name: Melissa Kirby MRN: [id number] Date of Birth: 10/18/1960  Today's Date: 05/30/2011 Time: 9147-8295 Time Calculation (min): 41 min Visit#: 3  of 12   Re-eval: 05/26/11  charge:  There ex 26' traction 10'  Subjective: Symptoms/Limitations Symptoms: Pt states that the traction gave her relief for about a day.  This morning when I woke up my hip and ankle were very sor. Pain Assessment Currently in Pain?: Yes Pain Score:   6 (lying down pain is at a 3) Pain Location: Back Pain Orientation: Left     Exercise/Treatments Stretches Active Hamstring Stretch: 30 seconds;3 reps Single Knee to Chest Stretch: 3 reps;30 seconds Piriformis Stretch: 1 rep;60 seconds;Limitations Lumbar Exercises   Stability Clam: 10 reps Dead Bug: 10 reps Bridge: 10 reps Heel Raises: Limitations   Modalities Modalities: Traction done prone Traction Min (lbs): 50# Max (lbs): 50# Hold Time: 10#  Physical Therapy Assessment and Plan PT Assessment and Plan Clinical Impression Statement: Dead bug added to program no other ex added secondary to increased pain.  Pt began static traction today. Rehab Potential: Good Clinical Impairments Affecting Rehab Potential: pain, tight mm, decreased strength. PT Plan: If static 50# felt good to pt increase weith to 60# next treatment.  traction is done prone. Begin prone exercises next treatment.    Goals  no radicular pain  Problem List Patient Active Problem List  Diagnoses  . Unspecified hypothyroidism  . VITAMIN B12 DEFICIENCY  . ANEMIA, IRON DEFICIENCY  . ANEMIA-NOS  . CIRCADIAN RHYTHM SLEEP DISORDER SHIFT WORK TYPE  . MIGRAINE HEADACHE  . GASTRITIS  . GI BLEEDING  . LOSS OF APPETITE  . DYSURIA  . Atrophic vaginitis  . Depression  . Fatigue  . Left ankle pain  . Left leg weakness    PT - End of Session Activity Tolerance: Patient tolerated treatment well General Behavior During Session: [facility] for  tasks performed Cognition: [facility] for tasks performed  RUSSELL,CINDY 05/30/2011, 2:50 PM

## 2011-06-02 ENCOUNTER — Ambulatory Visit (HOSPITAL_COMMUNITY)
Admission: RE | Admit: 2011-06-02 | Discharge: 2011-06-02 | Disposition: A | Discharge status: Home / Self Care | Payer: 59 | Source: Ambulatory Visit | Attending: Internal Medicine | Admitting: Internal Medicine

## 2011-06-02 DIAGNOSIS — R29898 Other symptoms and signs involving the musculoskeletal system: Secondary | ICD-10-CM

## 2011-06-02 NOTE — Progress Notes (Signed)
Physical Therapy Treatment Patient Details  Name: Melissa Kirby MRN: [id number] Date of Birth: [birth year]  Today's Date: 06/02/2011 Time: 6962-9528 Time Calculation (min): 48 min Visit#: 4  of 12   Re-eval: 06/25/11  charge There ex 2; traction 1.  Subjective: Symptoms/Limitations Symptoms: I 've been working on my walking.  I had a busy day Sat. and Sunday my pain was up.  Mornings are the best for me so I'm doing pretty good now.   Pain Assessment Pain Score:   2 Pain Location: Back Pain Orientation: Left  Exercise/Treatments Stretches Active Hamstring Stretch: 3 reps;30 seconds Single Knee to Chest Stretch: 3 reps;30 seconds   Stability Heel Squeeze: 10 reps Single Arm Raise: 10 reps Leg Raise: 10 reps Opposite Arm/Leg Raise: 10 reps Functional Squats: 10 reps Heel Raises: 10 reps   Modalities Modalities: Traction Traction Type of Traction: Lumbar Min (lbs): 60# static Max (lbs): 60# static  Physical Therapy Assessment and Plan PT Assessment and Plan Clinical Impression Statement: Added prone exercises as well as functional squating today.  Pt continues to improve in techinque. Clinical Impairments Affecting Rehab Potential: pain, tight mm decreased strength. PT Plan: Increase to 65# if pt did well with 60#;  begin forward lunge and side lunging to program.     Goals  NO radicular symptoms  Problem List Patient Active Problem List  Diagnoses  . Unspecified hypothyroidism  . VITAMIN B12 DEFICIENCY  . ANEMIA, IRON DEFICIENCY  . ANEMIA-NOS  . CIRCADIAN RHYTHM SLEEP DISORDER SHIFT WORK TYPE  . MIGRAINE HEADACHE  . GASTRITIS  . GI BLEEDING  . LOSS OF APPETITE  . DYSURIA  . Atrophic vaginitis  . Depression  . Fatigue  . Left ankle pain  . Left leg weakness    PT - End of Session Activity Tolerance: Patient tolerated treatment well General Behavior During Session: [facility] for tasks performed Cognition: [facility] for tasks  performed  Gay Rape,CINDY 06/02/2011, 8:37 AM

## 2011-06-03 ENCOUNTER — Ambulatory Visit: Payer: 59 | Admitting: Licensed Clinical Social Worker

## 2011-06-05 ENCOUNTER — Ambulatory Visit (HOSPITAL_COMMUNITY): Payer: 59 | Admitting: *Deleted

## 2011-06-05 ENCOUNTER — Telehealth (HOSPITAL_COMMUNITY): Payer: Self-pay

## 2011-06-06 ENCOUNTER — Ambulatory Visit (HOSPITAL_COMMUNITY): Payer: 59 | Admitting: Physical Therapy

## 2011-06-09 ENCOUNTER — Ambulatory Visit (HOSPITAL_COMMUNITY)
Admission: RE | Admit: 2011-06-09 | Discharge: 2011-06-09 | Disposition: A | Discharge status: Home / Self Care | Payer: 59 | Source: Ambulatory Visit | Attending: Internal Medicine | Admitting: Internal Medicine

## 2011-06-09 DIAGNOSIS — R29898 Other symptoms and signs involving the musculoskeletal system: Secondary | ICD-10-CM

## 2011-06-09 NOTE — Patient Instructions (Signed)
Pt to play close attention to her body mechanics.

## 2011-06-09 NOTE — Progress Notes (Signed)
Physical Therapy Treatment Patient Details  Name: Melissa Kirby MRN: [id number] Date of Birth: 06/12/1961  Today's Date: 06/09/2011 Time: 4098-1191 Time Calculation (min): 45 min Visit#: 5  of 12   Re-eval: 06/23/11  charge:  There ex 25', pelvic traction 1 Subjective: Symptoms/Limitations Symptoms: I had an injection on Friday morning.  It helpped for 2 days.  I tend not to put any weight on my L leg. Pain Assessment Currently in Pain?: Yes Pain Score:   2 Pain Location: Back Pain Orientation: Left Pain Radiating Towards: L leg   Exercise/Treatments Stretches Press Ups: 5 reps;Limitations Press Ups Limitations: x2 Stability Heel Squeeze: 10 reps Single Arm Raise: 10 reps Leg Raise: 10 reps Opposite Arm/Leg Raise: 10 reps Functional Squats: 10 reps Forward Lunge: 10 reps Side Lunge: 10 reps   Modalities Modalities: Traction Traction Type of Traction: Lumbar Min (lbs): 65# Max (lbs): 65# Time: 12  Physical Therapy Assessment and Plan PT Assessment and Plan Clinical Impression Statement: began standing lunges both side and forward.   PT Plan: begin plank exercises next treatment.    Goals  decrease radicular sx  Problem List Patient Active Problem List  Diagnoses  . Unspecified hypothyroidism  . VITAMIN B12 DEFICIENCY  . ANEMIA, IRON DEFICIENCY  . ANEMIA-NOS  . CIRCADIAN RHYTHM SLEEP DISORDER SHIFT WORK TYPE  . MIGRAINE HEADACHE  . GASTRITIS  . GI BLEEDING  . LOSS OF APPETITE  . DYSURIA  . Atrophic vaginitis  . Depression  . Fatigue  . Left ankle pain  . Left leg weakness    PT - End of Session Activity Tolerance: Patient tolerated treatment well General Behavior During Session: [facility] for tasks performed Cognition: [facility] for tasks performed  Charnae Lill,CINDY 06/09/2011, 9:19 AM

## 2011-06-11 ENCOUNTER — Ambulatory Visit (HOSPITAL_COMMUNITY)
Admission: RE | Admit: 2011-06-11 | Discharge: 2011-06-11 | Disposition: A | Discharge status: Home / Self Care | Payer: 59 | Source: Ambulatory Visit | Attending: Internal Medicine | Admitting: Internal Medicine

## 2011-06-11 NOTE — Progress Notes (Signed)
Physical Therapy Treatment Patient Details  Name: Melissa Kirby MRN: [id number] Date of Birth: July 11, 1961  Today's Date: 06/11/2011 Time: 0804-0900 Time Calculation (min): 56 min Visit#: 6  of 12   Re-eval: 06/23/11 Charges: therex x 35' Traction x 15'  Subjective: Symptoms/Limitations Symptoms: "The doctor prescribed some medicine for my nerve pain and I think it's helping" Pt reports radicular sx in her L foot today and stiffness in her back. Pain Assessment Currently in Pain?: Yes Pain Score:   1 Pain Location: Back Pain Orientation: Left Pain Type: Chronic pain   Exercise/Treatments Stretches Press Ups: 5 reps;Limitations;10 seconds Stability Heel Squeeze: 15 reps Single Arm Raise: 10 reps Leg Raise: 10 reps Opposite Arm/Leg Raise: 10 reps Plank: 3x15" Functional Squats: 15 reps Forward Lunge: 10 reps Side Lunge: 10 reps  Modalities Modalities: Traction Traction Type of Traction: Lumbar Min (lbs): 65# Max (lbs): 65# Hold Time: Static Time: 23'  Physical Therapy Assessment and Plan PT Assessment and Plan Clinical Impression Statement: Pt completes exercises with good form and minimal need for cueing. Began planks with good core control. Traction continued secondary to pain relief from previous txs. Pt reports no radicular sx after traction. PT Treatment/Interventions: Therapeutic exercise;Other (comment) (Mech traction) PT Plan: Continue to progress per PT POC.     Problem List Patient Active Problem List  Diagnoses  . Unspecified hypothyroidism  . VITAMIN B12 DEFICIENCY  . ANEMIA, IRON DEFICIENCY  . ANEMIA-NOS  . CIRCADIAN RHYTHM SLEEP DISORDER SHIFT WORK TYPE  . MIGRAINE HEADACHE  . GASTRITIS  . GI BLEEDING  . LOSS OF APPETITE  . DYSURIA  . Atrophic vaginitis  . Depression  . Fatigue  . Left ankle pain  . Left leg weakness    PT - End of Session Activity Tolerance: Patient tolerated treatment well General Behavior During Session: [facility] for  tasks performed Cognition: [facility] for tasks performed  Antonieta Iba 06/11/2011, 9:02 AM

## 2011-06-16 ENCOUNTER — Ambulatory Visit (HOSPITAL_COMMUNITY)
Admission: RE | Admit: 2011-06-16 | Discharge: 2011-06-16 | Disposition: A | Discharge status: Home / Self Care | Payer: 59 | Source: Ambulatory Visit | Attending: Internal Medicine | Admitting: Internal Medicine

## 2011-06-16 NOTE — Progress Notes (Signed)
Physical Therapy Treatment Patient Details  Name: Melissa Kirby MRN: [id number] Date of Birth: [birth year]  Today's Date: 06/16/2011 Time: 0805-0901 Time Calculation (min): 56 min Visit#: 7  of 12   Re-eval: 06/23/11 Charges: Therex x 36' Traction x 15'  Subjective: Symptoms/Limitations Symptoms: I'm having a little discomfort in my lower back ans the back of my calf. Pain Assessment Currently in Pain?: Yes Pain Score:   3 Pain Location: Back Pain Orientation: Lower Pain Radiating Towards: L calf   Exercise/Treatments Stretches Press Ups: 5 reps;Limitations;10 seconds Stability Heel Squeeze: 20 reps Single Arm Raise: 10 reps Leg Raise: 10 reps Opposite Arm/Leg Raise: 10 reps Plank: 3x10" (Pt only able to tolerate 10" plank) Functional Squats: 15 reps Forward Lunge: 15 reps Side Lunge: 15 reps Wall Slides: 10 reps;5 seconds  Modalities Modalities: Traction Traction Type of Traction: Lumbar Min (lbs): 70# Max (lbs): 70# Hold Time: Static Time: 32'  Physical Therapy Assessment and Plan PT Assessment and Plan Clinical Impression Statement: Began wall slides to improve LE and core strength with minimal difficulty after demo. Pt only able to tolerate 10" planks this session. Pt reports pain decrease to 2/10 in low back and decreased radicular sx after mech lumbar traction. PT Treatment/Interventions: Other (comment);Therapeutic exercise (Mech traction) PT Plan: Continue to progress per PT POC. Progress prone ex to quadruped next session.     Problem List Patient Active Problem List  Diagnoses  . Unspecified hypothyroidism  . VITAMIN B12 DEFICIENCY  . ANEMIA, IRON DEFICIENCY  . ANEMIA-NOS  . CIRCADIAN RHYTHM SLEEP DISORDER SHIFT WORK TYPE  . MIGRAINE HEADACHE  . GASTRITIS  . GI BLEEDING  . LOSS OF APPETITE  . DYSURIA  . Atrophic vaginitis  . Depression  . Fatigue  . Left ankle pain  . Left leg weakness    PT - End of Session Activity Tolerance: Patient  tolerated treatment well General Behavior During Session: [facility] for tasks performed Cognition: [facility] for tasks performed  Antonieta Iba 06/16/2011, 9:14 AM

## 2011-06-17 ENCOUNTER — Ambulatory Visit (INDEPENDENT_AMBULATORY_CARE_PROVIDER_SITE_OTHER): Payer: 59 | Admitting: Internal Medicine

## 2011-06-17 ENCOUNTER — Other Ambulatory Visit (INDEPENDENT_AMBULATORY_CARE_PROVIDER_SITE_OTHER): Payer: 59

## 2011-06-17 ENCOUNTER — Encounter: Payer: Self-pay | Admitting: Internal Medicine

## 2011-06-17 DIAGNOSIS — E039 Hypothyroidism, unspecified: Secondary | ICD-10-CM

## 2011-06-17 DIAGNOSIS — M25579 Pain in unspecified ankle and joints of unspecified foot: Secondary | ICD-10-CM

## 2011-06-17 DIAGNOSIS — F329 Major depressive disorder, single episode, unspecified: Secondary | ICD-10-CM

## 2011-06-17 DIAGNOSIS — F32A Depression, unspecified: Secondary | ICD-10-CM

## 2011-06-17 DIAGNOSIS — M25572 Pain in left ankle and joints of left foot: Secondary | ICD-10-CM

## 2011-06-17 DIAGNOSIS — F3289 Other specified depressive episodes: Secondary | ICD-10-CM

## 2011-06-17 LAB — TSH: TSH: 0.23 u[IU]/mL — ABNORMAL LOW (ref 0.35–5.50)

## 2011-06-17 NOTE — Patient Instructions (Signed)
It was good to see you today. Test(s) ordered today. Your results will be called to you after review (48-72hours after test completion). If any changes need to be made, you will be notified at that time. Medications reviewed, no changes at this time. Please schedule followup in 4 months to review symptoms and medications, call sooner if problems.

## 2011-06-17 NOTE — Assessment & Plan Note (Signed)
Improving with counseling (bond at [facility]) and pamelor - last dose increase 01/2011 Support offered - follow up 3 months, sooner if problems 

## 2011-06-17 NOTE — Assessment & Plan Note (Signed)
S/p total thyroidectomy 2000 Dose adjustment 12/2010 due to inc TSH - then slightly low 01/2011 but no dose change recheck now - titrate as needed  Lab Results  Component Value Date   TSH 0.04* 03/10/2011

## 2011-06-17 NOTE — Progress Notes (Signed)
Subjective:    Patient ID: Melissa Kirby, female    DOB: 11/06/1960, 50 y.o.   MRN: [id number]  HPI Here for follow up - reviewed chronic medical issues:  Depression - manifest as fatigue - seen x 2 12/2010 and again 01/2011 for same -  ?concern for MS in 12/2010 -> MRI brain for same unremrkable Also complicated by flare FM - prev tx with lyrica - med poorly tolerated,  resumed low dose nortriptyline 12/2010, dose increased 01/2011 - prev on same for migraine prophylaxis but off since 2011 - [place] improved, especially with counseling ([facility] q2weeks)  hypothyroid - prev followed with local endo for same -complicated hx reviewed. reports compliance with ongoing medical treatment and no recent medication dose change. denies adverse side effects related to current therapy.  no weight loss, no skin changes  headaches/migraines - prev followed with neuro for same but not seen since stablized symptoms. Weaned off topamax >50mos and off pamelor (until 12/2010). headache worst with stress and when " thyroid is off"; initially improved symptoms with gluten free diet. Now persisting low grade symptoms  - FMLA forms absence is signed for work excuse October 2012  anemia, hx iron defic, also found to have B12 defic 09/2009 - status post eval by GI: EGD/[facility] 10/2009 negative for celiac dz but improved symptoms with diet changes - follows gluten free diet and takes B12 pills  GERD - reports compliance with ongoing medical treatment and no changes in medication dose or frequency. denies adverse side effects related to current therapy.  L ankle ankle pain - s/p ortho eval (draper)>>felt related to lumbar back issues - s/p ESI x 2 with minimal improvement in symptoms -  Out of work since 10/18 due to same  Past Medical History  Diagnosis Date  . VITAMIN B12 DEFICIENCY 09/2009 dx  . MIGRAINE HEADACHE   . GERD (gastroesophageal reflux disease)   . ANEMIA, IRON DEFICIENCY   . Unspecified hypothyroidism  1982/2000    surgical resection of benign growth - partial then complete    Review of Systems  Constitutional: Positive for fatigue. Negative for fever and unexpected weight change.  Respiratory: Negative for shortness of breath.   Cardiovascular: Negative for chest pain.      Objective:   Physical Exam  BP 102/76  Pulse 108  Temp(Src) 98.9 F (37.2 C) (Oral)  Ht 5\' 2"  (1.575 m)  Wt 131 lb 12.8 oz (59.784 kg)  BMI 24.11 kg/m2  SpO2 95% Wt Readings from Last 3 Encounters:  06/17/11 131 lb 12.8 oz (59.784 kg)  03/10/11 128 lb (58.06 kg)  02/10/11 125 lb 6.4 oz (56.881 kg)   Constitutional: She is appears well-developed and well-nourished. No distress, less fatigued appearing.  Neck: Normal range of motion, supple. No JVD present. No mass, thyroidectomy scar well healed Cardiovascular: Normal rate, regular rhythm and normal heart sounds.  No murmur heard. No BLE edema Pulmonary/Chest: Effort normal and breath sounds normal. No respiratory distress. She has no wheezes.  Psyc: less anxious and depressed appearing than prior visits-   Lab Results  Component Value Date   WBC 7.4 12/31/2010   HGB 12.2 12/31/2010   HCT 36.1 12/31/2010   PLT 194.0 12/31/2010   CHOL 178 10/03/2009   TRIG 71.0 10/03/2009   HDL 70.50 10/03/2009   ALT 17 12/31/2010   AST 23 12/31/2010   NA 137 12/31/2010   K 4.2 12/31/2010   CL 102 12/31/2010   CREATININE 0.9 12/31/2010  BUN 13 12/31/2010   CO2 29 12/31/2010   TSH 0.04* 03/10/2011      Assessment & Plan:  See problem list. Medications and labs reviewed today.

## 2011-06-17 NOTE — Assessment & Plan Note (Signed)
Working with ortho MW - [facility] and [facility] for same  Related to lumbar dz, not ankle issue Support offered - cont same, PT, ESI, gabapentin,

## 2011-06-18 ENCOUNTER — Ambulatory Visit (HOSPITAL_COMMUNITY)
Admission: RE | Admit: 2011-06-18 | Discharge: 2011-06-18 | Disposition: A | Discharge status: Home / Self Care | Payer: 59 | Source: Ambulatory Visit | Attending: Internal Medicine | Admitting: Internal Medicine

## 2011-06-18 NOTE — Progress Notes (Signed)
Physical Therapy Treatment Patient Details  Name: Melissa Kirby MRN: [id number] Date of Birth: 22-Dec-1960  Today's Date: 06/18/2011 Time: 5784-6962 Time Calculation (min): 46 min Visit#: 8  of 12   Re-eval: 06/23/11 Charges:  therex 26', traction 15'    Subjective: Symptoms/Limitations Symptoms: Pt. reports her pain is up today and L ankle is really bothering her today.  Pain rating 3/10. Pain Assessment Currently in Pain?: Yes Pain Score:   3 Pain Radiating Towards: Low back with radiating to Left ankle/foot.   Exercise/Treatments Stretches Press Ups: Limitations Press Ups Limitations: 10 reps 5"holds Stability Heel Squeeze: 20 reps Single Arm Raise: 10 reps Leg Raise: 10 reps Opposite Arm/Leg Raise: 10 reps Plank: 3x15" Functional Squats: 15 reps Forward Lunge: 15 reps Wall Slides: 10 reps;5 seconds   Modalities Modalities: Traction Traction Type of Traction: Lumbar Max (lbs): 70 Hold Time: static Time: 59'  Physical Therapy Assessment and Plan PT Assessment and Plan Clinical Impression Statement: Pt. demonstrates good form with therex.  Able to complete planks for 15 second holds.  No new therex added secondary to increased pain.  Painfree following traction today.  Pt. states it usually only lasts a couple of hours. PT Plan: Continue X 4 more visits then re-evaluate for MD on 06/25/11.    Problem List Patient Active Problem List  Diagnoses  . Unspecified hypothyroidism  . VITAMIN B12 DEFICIENCY  . ANEMIA, IRON DEFICIENCY  . ANEMIA-NOS  . CIRCADIAN RHYTHM SLEEP DISORDER SHIFT WORK TYPE  . MIGRAINE HEADACHE  . GASTRITIS  . GI BLEEDING  . LOSS OF APPETITE  . DYSURIA  . Atrophic vaginitis  . Depression  . Fatigue  . Left ankle pain  . Left leg weakness    PT - End of Session Activity Tolerance: Patient tolerated treatment well General Behavior During Session: [facility] for tasks performed Cognition: [facility] for tasks performed  Lurena Nida 06/18/2011, 8:38 AM

## 2011-06-20 ENCOUNTER — Ambulatory Visit (HOSPITAL_COMMUNITY): Payer: 59 | Admitting: Physical Therapy

## 2011-06-21 HISTORY — PX: POSTERIOR FUSION LUMBAR SPINE: SUR632

## 2011-06-23 ENCOUNTER — Ambulatory Visit (HOSPITAL_COMMUNITY)
Admission: RE | Admit: 2011-06-23 | Discharge: 2011-06-23 | Disposition: A | Discharge status: Home / Self Care | Payer: 59 | Source: Ambulatory Visit | Attending: Internal Medicine | Admitting: Internal Medicine

## 2011-06-23 DIAGNOSIS — IMO0001 Reserved for inherently not codable concepts without codable children: Secondary | ICD-10-CM | POA: Insufficient documentation

## 2011-06-23 DIAGNOSIS — M545 Low back pain, unspecified: Secondary | ICD-10-CM | POA: Insufficient documentation

## 2011-06-23 DIAGNOSIS — M79609 Pain in unspecified limb: Secondary | ICD-10-CM | POA: Insufficient documentation

## 2011-06-23 DIAGNOSIS — M6281 Muscle weakness (generalized): Secondary | ICD-10-CM | POA: Insufficient documentation

## 2011-06-23 NOTE — Progress Notes (Signed)
Physical Therapy Treatment Note Patient Details  Name: Melissa Kirby MRN: [id number] Date of Birth: [birth year]  Today's Date: 06/23/2011 Time: 1700-1740 Time Calculation (min): 40 min Visit#: 9  of 12   Re-eval: 06/23/11 Charges: MMT, traction  Past Medical History:  Past Medical History  Diagnosis Date  . VITAMIN B12 DEFICIENCY 09/2009 dx  . MIGRAINE HEADACHE   . GERD (gastroesophageal reflux disease)   . ANEMIA, IRON DEFICIENCY   . Unspecified hypothyroidism 1982/2000    surgical resection of benign growth - partial then complete   Past Surgical History:  Past Surgical History  Procedure Date  . Tubal ligation 1989  . Cholecystectomy 2009  . Total thyroidectomy 2000    Had thyroid nodule, nonmalignant (1982-2000) resection    Subjective Symptoms/Limitations Symptoms: Pt. states she feels stronger and has more body awareness/improved body mechanics than did previously, however her radiating pain remains the same into left ankle/foot.  Pt. reports her Left big toe and second toe stay numb.  States traction only gives her relief for approx. 2 hours.  Assessment LLE Strength Left Hip Flexion: 4/5 (was 4/5) Left Hip Extension: 4/5 (was 4/5) Left Hip ABduction: 4/5 (was 4/5) Left Hip ADduction: 4/5 (was 4/5) Left Knee Flexion: 4/5 (was 4/5) Left Knee Extension: 5/5 (was 4/5) Left Ankle Dorsiflexion: 4/5 (was 4/5) Lumbar AROM Lumbar Flexion: WNL  (was painful for pt. ) Lumbar Extension: wnl with increased pain. Lumbar - Right Side Bend: wnl Lumbar - Left Side Bend: wnl  Lumbar - Right Rotation: wnl Lumbar - Left Rotation: wnl  Exercise/Treatments  Modalities Modalities: Traction Traction Type of Traction: Lumbar Min (lbs): 70# Hold Time: static hold for 15 minutes  Physical Therapy Assessment and Plan PT Assessment and Plan Clinical Impression Statement: Pt with improved body mechanics and  Left quad strength, however no change in pain/numbness into  left foot and ankle.  Pt. returns to MD Wednesday morning. Pt. Met 2 of 3 STG's and has not met any LTG's.  PT Plan: To await further recommendations from MD as treatment unable to provide lasting pain relief.    Goals Home Exercise Program PT Goal: Perform Home Exercise Program - Progress: Met PT Short Term Goals PT Short Term Goal 1 - Progress: Not met (starts at anterior ankle into foot; sometimes radiates up ) PT Short Term Goal 2 - Progress: Met PT Short Term Goal 3 - Progress: Met PT Long Term Goals PT Long Term Goal 1 - Progress: Progressing toward goal PT Long Term Goal 2 - Progress: Not met Long Term Goal 3 Progress: Not met  Problem List Patient Active Problem List  Diagnoses  . Unspecified hypothyroidism  . VITAMIN B12 DEFICIENCY  . ANEMIA, IRON DEFICIENCY  . ANEMIA-NOS  . CIRCADIAN RHYTHM SLEEP DISORDER SHIFT WORK TYPE  . MIGRAINE HEADACHE  . GASTRITIS  . GI BLEEDING  . LOSS OF APPETITE  . DYSURIA  . Atrophic vaginitis  . Depression  . Fatigue  . Left ankle pain  . Left leg weakness    PT - End of Session Activity Tolerance: Patient tolerated treatment well   Lurena Nida 06/23/2011, 5:31 PM

## 2011-06-25 ENCOUNTER — Ambulatory Visit (HOSPITAL_COMMUNITY): Payer: 59 | Admitting: *Deleted

## 2011-06-27 ENCOUNTER — Ambulatory Visit (HOSPITAL_COMMUNITY): Payer: 59 | Admitting: Physical Therapy

## 2011-06-29 ENCOUNTER — Encounter (HOSPITAL_COMMUNITY): Payer: Self-pay

## 2011-06-29 ENCOUNTER — Emergency Department (HOSPITAL_COMMUNITY)
Admission: EM | Admit: 2011-06-29 | Discharge: 2011-06-29 | Disposition: A | Discharge status: Home / Self Care | Payer: 59 | Attending: Emergency Medicine | Admitting: Emergency Medicine

## 2011-06-29 DIAGNOSIS — R51 Headache: Secondary | ICD-10-CM | POA: Insufficient documentation

## 2011-06-29 DIAGNOSIS — Z862 Personal history of diseases of the blood and blood-forming organs and certain disorders involving the immune mechanism: Secondary | ICD-10-CM | POA: Insufficient documentation

## 2011-06-29 DIAGNOSIS — E039 Hypothyroidism, unspecified: Secondary | ICD-10-CM | POA: Insufficient documentation

## 2011-06-29 DIAGNOSIS — E538 Deficiency of other specified B group vitamins: Secondary | ICD-10-CM | POA: Insufficient documentation

## 2011-06-29 DIAGNOSIS — K219 Gastro-esophageal reflux disease without esophagitis: Secondary | ICD-10-CM | POA: Insufficient documentation

## 2011-06-29 MED ORDER — PROMETHAZINE HCL 25 MG/ML IJ SOLN
25.0000 mg | Freq: Once | INTRAMUSCULAR | Status: AC
  Administered 2011-06-29: 25 mg via INTRAMUSCULAR
  Filled 2011-06-29: qty 1

## 2011-06-29 MED ORDER — KETOROLAC TROMETHAMINE 60 MG/2ML IM SOLN
60.0000 mg | Freq: Once | INTRAMUSCULAR | Status: AC
  Administered 2011-06-29: 60 mg via INTRAMUSCULAR
  Filled 2011-06-29: qty 2

## 2011-06-29 NOTE — ED Provider Notes (Signed)
History     CSN: [id number] Arrival date & time: 06/29/2011  6:09 PM   First MD Initiated Contact with Patient 06/29/11 1822      Chief Complaint  Patient presents with  . Migraine    (Consider location/radiation/quality/duration/timing/severity/associated sxs/prior treatment) HPI Comments: Patient repots hx of frequent migraines.  States she also has chronic low back pain and took a vicodin on the day prior to ED arrival for her back pain and she states she is unsure if the pain pill triggered her headache or the level of pain she was experiencing with her back is what caused the headache.  Describes the headache as gradual onset and similar to her previous headaches  Patient is a 50 y.o. female presenting with headaches. The history is provided by the patient.  Headache  This is a new problem. The current episode started yesterday. The problem occurs constantly. The problem has not changed since onset.Associated with: back pain. The pain is located in the frontal region. The quality of the pain is described as throbbing. The pain is moderate. The pain does not radiate. Associated symptoms include nausea. Pertinent negatives include no fever, no near-syncope, no orthopnea, no palpitations, no syncope and no shortness of breath. She has tried oral narcotic analgesics for the symptoms. The treatment provided no relief.    Past Medical History  Diagnosis Date  . VITAMIN B12 DEFICIENCY 09/2009 dx  . MIGRAINE HEADACHE   . GERD (gastroesophageal reflux disease)   . ANEMIA, IRON DEFICIENCY   . Unspecified hypothyroidism 1982/2000    surgical resection of benign growth - partial then complete    Past Surgical History  Procedure Date  . Tubal ligation 1989  . Cholecystectomy 2009  . Total thyroidectomy 2000    Had thyroid nodule, nonmalignant (4098-1191) resection    Family History  Problem Relation Age of Onset  . Hyperlipidemia Mother   . Heart disease Mother   . Hyperlipidemia  Father   . Breast cancer Maternal Grandmother   . Hyperlipidemia Maternal Grandmother   . Ovarian cancer Other     History  Substance Use Topics  . Smoking status: Never Smoker   . Smokeless tobacco: Not on file   Comment: Married, lives with spouse. 3 children. 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 Alcohol Use: No    OB History    Grav Para Term Preterm Abortions TAB SAB Ect Mult Living                  Review of Systems  Constitutional: Negative for fever and chills.  HENT: Negative for sore throat, trouble swallowing, neck pain and neck stiffness.   Eyes: Positive for photophobia. Negative for visual disturbance.  Respiratory: Negative for shortness of breath and wheezing.   Cardiovascular: Negative for chest pain, palpitations, orthopnea, syncope and near-syncope.  Gastrointestinal: Positive for nausea. Negative for abdominal pain.  Genitourinary: Negative for flank pain.  Musculoskeletal: Negative for myalgias and arthralgias.  Skin: Negative for rash.  Neurological: Positive for headaches. Negative for syncope, facial asymmetry, weakness and numbness.  Hematological: Does not bruise/bleed easily.  All other systems reviewed and are negative.    Allergies  Compazine and Reglan  Home Medications   Current Outpatient Rx  Name Route Sig Dispense Refill  . CALCIUM-VITAMIN D 185 MG PO TABS Oral Take 1,000 mg by mouth daily.      Marland Kitchen ESTROGENS, CONJUGATED 0.625 MG/GM VA CREA Vaginal Place 0.5 Applicatorfuls vaginally 3 (three) times  a week. 45 g 1  . GABAPENTIN 300 MG PO CAPS Oral Take 300 mg by mouth 2 (two) times daily. For 1 week the 1 three times a day     . LEVOTHYROXINE SODIUM 150 MCG PO TABS Oral Take 1 tablet (150 mcg total) by mouth every other day. Or as directed 30 tablet 11  . LEVOTHYROXINE SODIUM 175 MCG PO TABS Oral Take 1 tablet (175 mcg total) by mouth every other day. 30 tablet 6  . ONE-DAILY MULTI VITAMINS PO TABS Oral Take 1 tablet by  mouth daily.      Marland Kitchen NORTRIPTYLINE HCL 50 MG PO CAPS Oral Take 1 capsule (50 mg total) by mouth at bedtime. 30 capsule 2    BP 116/81  Pulse 108  Temp(Src) 98.1 F (36.7 C) (Oral)  Resp 18  Ht 5\' 2"  (1.575 m)  Wt 132 lb (59.875 kg)  BMI 24.14 kg/m2  SpO2 100%  Physical Exam  Nursing note and vitals reviewed. Constitutional: She is oriented to person, place, and time. She appears well-developed and well-nourished.       Comfortable appearing  HENT:  Head: Normocephalic and atraumatic.  Mouth/Throat: Oropharynx is clear and moist.  Eyes: EOM are normal. Pupils are equal, round, and reactive to light.  Neck: Normal range of motion. Neck supple.  Cardiovascular: Normal rate, regular rhythm and normal heart sounds.   Pulmonary/Chest: Effort normal and breath sounds normal. No respiratory distress. She exhibits no tenderness.  Abdominal: Soft. She exhibits no distension. There is no tenderness.  Musculoskeletal: Normal range of motion. She exhibits no tenderness.  Lymphadenopathy:    She has no cervical adenopathy.  Neurological: She is alert and oriented to person, place, and time. She has normal reflexes. No cranial nerve deficit. She exhibits normal muscle tone. Coordination normal.  Skin: Skin is warm and dry.    ED Course  Procedures (including critical care time)       MDM      7:10 PM patient is feeling better, headache has improved.  Requesting to go home.  PAin now rated at "4".  Pt agrees to f/u with her PMD this week  Pt feels improved after observation and/or treatment in ED.   Patient / Family / Caregiver understand and agree with initial ED impression and plan with expectations set for ED visit.   Tieasha Larsen L. [place], [place] 07/01/11 2207

## 2011-06-29 NOTE — ED Notes (Signed)
Pt presents with migraine since last night. Pt has history of same. Pt started taking hydrocodone yesterday for back pain and that is when the migraine started. Pt vomited earlier but now is not.

## 2011-06-30 ENCOUNTER — Other Ambulatory Visit: Payer: Self-pay | Admitting: Internal Medicine

## 2011-07-01 ENCOUNTER — Other Ambulatory Visit: Payer: Self-pay | Admitting: Orthopedic Surgery

## 2011-07-01 ENCOUNTER — Encounter (HOSPITAL_COMMUNITY): Payer: Self-pay

## 2011-07-02 NOTE — ED Provider Notes (Signed)
Medical screening examination/treatment/procedure(s) were performed by non-physician practitioner and as supervising physician I was immediately available for consultation/collaboration.   Seeley Hissong M Oreoluwa Aigner, DO 07/02/11 1509 

## 2011-07-04 ENCOUNTER — Encounter (HOSPITAL_COMMUNITY)
Admission: RE | Admit: 2011-07-04 | Discharge: 2011-07-04 | Disposition: A | Discharge status: Home / Self Care | Payer: 59 | Source: Ambulatory Visit | Attending: Orthopedic Surgery | Admitting: Orthopedic Surgery

## 2011-07-04 ENCOUNTER — Other Ambulatory Visit: Payer: Self-pay

## 2011-07-04 ENCOUNTER — Encounter (HOSPITAL_COMMUNITY): Payer: Self-pay

## 2011-07-04 HISTORY — DX: Major depressive disorder, single episode, unspecified: F32.9

## 2011-07-04 HISTORY — DX: Calculus of kidney: N20.0

## 2011-07-04 HISTORY — DX: Depression, unspecified: F32.A

## 2011-07-04 HISTORY — DX: Fibromyalgia: M79.7

## 2011-07-04 LAB — COMPREHENSIVE METABOLIC PANEL
ALT: 17 U/L (ref 0–35)
AST: 22 U/L (ref 0–37)
Albumin: 4 g/dL (ref 3.5–5.2)
Alkaline Phosphatase: 140 U/L — ABNORMAL HIGH (ref 39–117)
BUN: 7 mg/dL (ref 6–23)
CO2: 25 mEq/L (ref 19–32)
Calcium: 9.7 mg/dL (ref 8.4–10.5)
Chloride: 105 mEq/L (ref 96–112)
Creatinine, Ser: 0.75 mg/dL (ref 0.50–1.10)
GFR calc Af Amer: >90 mL/min (ref >90)
GFR calc non Af Amer: >90 mL/min (ref >90)
Glucose, Bld: 86 mg/dL (ref 70–99)
Potassium: 3.9 mEq/L (ref 3.5–5.1)
Sodium: 139 mEq/L (ref 135–145)
Total Bilirubin: 0.3 mg/dL (ref 0.3–1.2)
Total Protein: 7.6 g/dL (ref 6.0–8.3)

## 2011-07-04 LAB — DIFFERENTIAL
Basophils Absolute: 0 10*3/uL (ref 0.0–0.1)
Basophils Relative: 1 % (ref 0–1)
Eosinophils Absolute: 0.1 10*3/uL (ref 0.0–0.7)
Eosinophils Relative: 2 % (ref 0–5)
Lymphocytes Relative: 47 % — ABNORMAL HIGH (ref 12–46)
Lymphs Abs: 2.8 10*3/uL (ref 0.7–4.0)
Monocytes Absolute: 0.7 10*3/uL (ref 0.1–1.0)
Monocytes Relative: 12 % (ref 3–12)
Neutro Abs: 2.2 10*3/uL (ref 1.7–7.7)
Neutrophils Relative %: 38 % — ABNORMAL LOW (ref 43–77)

## 2011-07-04 LAB — ABO/RH: ABO/RH(D): O POS

## 2011-07-04 LAB — CBC
HCT: 37.1 % (ref 36.0–46.0)
Hemoglobin: 12 g/dL (ref 12.0–15.0)
MCH: 29.7 pg (ref 26.0–34.0)
MCHC: 32.3 g/dL (ref 30.0–36.0)
MCV: 91.8 fL (ref 78.0–100.0)
Platelets: 259 10*3/uL (ref 150–400)
RBC: 4.04 MIL/uL (ref 3.87–5.11)
RDW: 12.7 % (ref 11.5–15.5)
WBC: 5.8 10*3/uL (ref 4.0–10.5)

## 2011-07-04 LAB — URINALYSIS, ROUTINE W REFLEX MICROSCOPIC
Bilirubin Urine: NEGATIVE
Glucose, UA: NEGATIVE mg/dL
Hgb urine dipstick: NEGATIVE
Ketones, ur: NEGATIVE mg/dL
Nitrite: NEGATIVE
Protein, ur: NEGATIVE mg/dL
Specific Gravity, Urine: 1.01 (ref 1.005–1.030)
Urobilinogen, UA: 0.2 mg/dL (ref 0.0–1.0)
pH: 6 (ref 5.0–8.0)

## 2011-07-04 LAB — TYPE AND SCREEN
ABO/RH(D): O POS
Antibody Screen: NEGATIVE

## 2011-07-04 LAB — URINE MICROSCOPIC-ADD ON

## 2011-07-04 LAB — PROTIME-INR
INR: 1.02 (ref 0.00–1.49)
Prothrombin Time: 13.6 seconds (ref 11.6–15.2)

## 2011-07-04 LAB — SURGICAL PCR SCREEN
MRSA, PCR: NEGATIVE
Staphylococcus aureus: NEGATIVE

## 2011-07-04 LAB — APTT: aPTT: 29 seconds (ref 24–37)

## 2011-07-04 NOTE — Pre-Procedure Instructions (Signed)
20 Melissa Kirby  07/04/2011   Your procedure is scheduled on:Wednesday, December 19th  Report to [facility] Short Stay Center at 11:30AM.  Call this number if you have problems the morning of surgery: [phone]   Remember:   Do not eat food:After Midnight.  May have clear liquids: up to 4 Hours before arrival. 9:30am  Clear liquids include soda, tea, black coffee, apple or grape juice, broth.  Take these medicines the morning of surgery with A SIP OF WATER: Levothyroxine, Nortriptyline, Neurotin   Do not wear jewelry, make-up or nail polish.  Do not wear lotions, powders, or perfumes. You may wear deodorant.  Do not shave 48 hours prior to surgery.  Do not bring valuables to the hospital.  Contacts, dentures or bridgework may not be worn into surgery.  Leave suitcase in the car. After surgery it may be brought to your room.  For patients admitted to the hospital, checkout time is 11:00 AM the day of discharge.   Patients discharged the day of surgery will not be allowed to drive home.    Special Instructions: CHG Shower Use Special Wash: 1/2 bottle night before surgery and 1/2 bottle morning of surgery.   Please read over the following fact sheets that you were given: Pain Booklet, Coughing and Deep Breathing, Blood Transfusion Information, MRSA Information and Surgical Site Infection Prevention

## 2011-07-04 NOTE — Pre-Procedure Instructions (Cosign Needed)
20 SPRUHA WEIGHT  07/04/2011   Your procedure is scheduled on:  Wednesday, December 19th  Report to [facility] Short Stay Center at 11:30AM.  Call this number if you have problems the morning of surgery: [phone]   Remember:   Do not eat food:After Midnight.  May have clear liquids: up to 4 Hours before arrival. 7:30  Clear liquids include soda, tea, black coffee, apple or grape juice, broth.  Take these medicines the morning of surgery with A SIP OF WATER: Levothyroxine, Nortriptyline, Neurotin   Do not wear jewelry, make-up or nail polish.  Do not wear lotions, powders, or perfumes. You may wear deodorant.  Do not shave 48 hours prior to surgery.  Do not bring valuables to the hospital.  Contacts, dentures or bridgework may not be worn into surgery.  Leave suitcase in the car. After surgery it may be brought to your room.  For patients admitted to the hospital, checkout time is 11:00 AM the day of discharge.   Patients discharged the day of surgery will not be allowed to drive home.    Special Instructions: CHG Shower Use Special Wash: 1/2 bottle night before surgery and 1/2 bottle morning of surgery.   Please read over the following fact sheets that you were given: Pain Booklet, Coughing and Deep Breathing, Blood Transfusion Information, MRSA Information and Surgical Site Infection Prevention

## 2011-07-08 MED ORDER — CHLORHEXIDINE GLUCONATE 4 % EX LIQD: 60.0000 mL | Freq: Once | CUTANEOUS | Status: DC

## 2011-07-08 MED ORDER — CEFAZOLIN SODIUM 1-5 GM-% IV SOLN
1.0000 g | INTRAVENOUS | Status: AC
  Administered 2011-07-09: 1 g via INTRAVENOUS
  Filled 2011-07-08: qty 50

## 2011-07-08 MED ORDER — POVIDONE-IODINE 7.5 % EX SOLN
Freq: Once | CUTANEOUS | Status: DC
  Filled 2011-07-08: qty 118

## 2011-07-09 ENCOUNTER — Encounter (HOSPITAL_COMMUNITY): Payer: Self-pay | Admitting: *Deleted

## 2011-07-09 ENCOUNTER — Encounter (HOSPITAL_COMMUNITY)
Admission: RE | Disposition: A | Discharge status: Home / Self Care | Payer: Self-pay | Source: Ambulatory Visit | Attending: Orthopedic Surgery

## 2011-07-09 ENCOUNTER — Ambulatory Visit (HOSPITAL_COMMUNITY): Payer: 59

## 2011-07-09 ENCOUNTER — Encounter (HOSPITAL_COMMUNITY): Payer: Self-pay | Admitting: Anesthesiology

## 2011-07-09 ENCOUNTER — Ambulatory Visit (HOSPITAL_COMMUNITY): Payer: 59 | Admitting: Anesthesiology

## 2011-07-09 ENCOUNTER — Inpatient Hospital Stay (HOSPITAL_COMMUNITY)
Admission: RE | Admit: 2011-07-09 | Discharge: 2011-07-12 | DRG: 460 | Disposition: A | Discharge status: Home / Self Care | Payer: 59 | Source: Ambulatory Visit | Attending: Orthopedic Surgery | Admitting: Orthopedic Surgery

## 2011-07-09 DIAGNOSIS — E890 Postprocedural hypothyroidism: Secondary | ICD-10-CM | POA: Diagnosis present

## 2011-07-09 DIAGNOSIS — F329 Major depressive disorder, single episode, unspecified: Secondary | ICD-10-CM | POA: Diagnosis present

## 2011-07-09 DIAGNOSIS — D62 Acute posthemorrhagic anemia: Secondary | ICD-10-CM | POA: Diagnosis not present

## 2011-07-09 DIAGNOSIS — E039 Hypothyroidism, unspecified: Secondary | ICD-10-CM

## 2011-07-09 DIAGNOSIS — E559 Vitamin D deficiency, unspecified: Secondary | ICD-10-CM | POA: Diagnosis present

## 2011-07-09 DIAGNOSIS — M5126 Other intervertebral disc displacement, lumbar region: Principal | ICD-10-CM | POA: Diagnosis present

## 2011-07-09 DIAGNOSIS — F3289 Other specified depressive episodes: Secondary | ICD-10-CM | POA: Diagnosis present

## 2011-07-09 DIAGNOSIS — Z87442 Personal history of urinary calculi: Secondary | ICD-10-CM

## 2011-07-09 DIAGNOSIS — I959 Hypotension, unspecified: Secondary | ICD-10-CM | POA: Diagnosis not present

## 2011-07-09 DIAGNOSIS — R112 Nausea with vomiting, unspecified: Secondary | ICD-10-CM | POA: Diagnosis not present

## 2011-07-09 DIAGNOSIS — G43909 Migraine, unspecified, not intractable, without status migrainosus: Secondary | ICD-10-CM | POA: Diagnosis present

## 2011-07-09 DIAGNOSIS — Q762 Congenital spondylolisthesis: Secondary | ICD-10-CM

## 2011-07-09 DIAGNOSIS — D509 Iron deficiency anemia, unspecified: Secondary | ICD-10-CM | POA: Diagnosis present

## 2011-07-09 DIAGNOSIS — R209 Unspecified disturbances of skin sensation: Secondary | ICD-10-CM | POA: Diagnosis present

## 2011-07-09 DIAGNOSIS — K219 Gastro-esophageal reflux disease without esophagitis: Secondary | ICD-10-CM | POA: Diagnosis present

## 2011-07-09 DIAGNOSIS — IMO0001 Reserved for inherently not codable concepts without codable children: Secondary | ICD-10-CM | POA: Diagnosis present

## 2011-07-09 SURGERY — POSTERIOR LUMBAR FUSION 1 LEVEL
Anesthesia: General | Laterality: Left

## 2011-07-09 SURGERY — Surgical Case
Anesthesia: *Unknown

## 2011-07-09 MED ORDER — HETASTARCH-ELECTROLYTES 6 % IV SOLN
INTRAVENOUS | Status: DC | PRN
  Administered 2011-07-09: 19:00:00 via INTRAVENOUS

## 2011-07-09 MED ORDER — ADULT MULTIVITAMIN W/MINERALS CH
1.0000 | ORAL_TABLET | Freq: Every day | ORAL | Status: DC
  Administered 2011-07-10 – 2011-07-12 (×3): 1 via ORAL
  Filled 2011-07-09 (×3): qty 1

## 2011-07-09 MED ORDER — DEXTROSE 5 % IV SOLN
INTRAVENOUS | Status: DC | PRN
  Administered 2011-07-09: 17:00:00 via INTRAVENOUS

## 2011-07-09 MED ORDER — SODIUM CHLORIDE 0.9 % IJ SOLN: 3.0000 mL | INTRAMUSCULAR | Status: DC | PRN

## 2011-07-09 MED ORDER — DIPHENHYDRAMINE HCL 12.5 MG/5ML PO ELIX: 12.5000 mg | ORAL_SOLUTION | Freq: Four times a day (QID) | ORAL | Status: DC | PRN

## 2011-07-09 MED ORDER — FENTANYL CITRATE 0.05 MG/ML IJ SOLN
INTRAMUSCULAR | Status: DC | PRN
  Administered 2011-07-09 (×2): 100 ug via INTRAVENOUS
  Administered 2011-07-09 (×3): 50 ug via INTRAVENOUS
  Administered 2011-07-09: 150 ug via INTRAVENOUS
  Administered 2011-07-09 (×2): 50 ug via INTRAVENOUS

## 2011-07-09 MED ORDER — NEOSTIGMINE METHYLSULFATE 1 MG/ML IJ SOLN
INTRAMUSCULAR | Status: DC | PRN
  Administered 2011-07-09: 2.5 mg via INTRAVENOUS

## 2011-07-09 MED ORDER — GABAPENTIN 300 MG PO CAPS
300.0000 mg | ORAL_CAPSULE | Freq: Three times a day (TID) | ORAL | Status: DC
  Filled 2011-07-09 (×10): qty 1

## 2011-07-09 MED ORDER — ONDANSETRON HCL 4 MG/2ML IJ SOLN: 4.0000 mg | Freq: Four times a day (QID) | INTRAMUSCULAR | Status: DC | PRN

## 2011-07-09 MED ORDER — LEVOTHYROXINE SODIUM 150 MCG PO TABS
150.0000 ug | ORAL_TABLET | ORAL | Status: DC
  Filled 2011-07-09: qty 1

## 2011-07-09 MED ORDER — HYDROXYZINE HCL 50 MG/ML IM SOLN: 50.0000 mg | INTRAMUSCULAR | Status: DC | PRN

## 2011-07-09 MED ORDER — LEVOTHYROXINE SODIUM 175 MCG PO TABS
175.0000 ug | ORAL_TABLET | ORAL | Status: DC
  Administered 2011-07-10 – 2011-07-12 (×2): 175 ug via ORAL
  Filled 2011-07-09 (×2): qty 1

## 2011-07-09 MED ORDER — MENTHOL 3 MG MT LOZG: 1.0000 | LOZENGE | OROMUCOSAL | Status: DC | PRN

## 2011-07-09 MED ORDER — NORTRIPTYLINE HCL 25 MG PO CAPS
50.0000 mg | ORAL_CAPSULE | Freq: Every day | ORAL | Status: DC
  Administered 2011-07-10 – 2011-07-11 (×3): 50 mg via ORAL
  Filled 2011-07-09 (×4): qty 2

## 2011-07-09 MED ORDER — ESTROGENS, CONJUGATED 0.625 MG/GM VA CREA
0.5000 | TOPICAL_CREAM | VAGINAL | Status: DC
  Filled 2011-07-09: qty 42.5

## 2011-07-09 MED ORDER — SODIUM CHLORIDE 0.9 % IJ SOLN
3.0000 mL | Freq: Two times a day (BID) | INTRAMUSCULAR | Status: DC
  Administered 2011-07-10: 3 mL via INTRAVENOUS

## 2011-07-09 MED ORDER — ZOLPIDEM TARTRATE 5 MG PO TABS: 5.0000 mg | ORAL_TABLET | Freq: Every evening | ORAL | Status: DC | PRN

## 2011-07-09 MED ORDER — POTASSIUM CHLORIDE IN NACL 20-0.9 MEQ/L-% IV SOLN
INTRAVENOUS | Status: DC
  Administered 2011-07-10 (×2): via INTRAVENOUS
  Filled 2011-07-09 (×3): qty 1000

## 2011-07-09 MED ORDER — CEFAZOLIN SODIUM 1-5 GM-% IV SOLN
1.0000 g | Freq: Three times a day (TID) | INTRAVENOUS | Status: AC
  Administered 2011-07-10 (×2): 1 g via INTRAVENOUS
  Filled 2011-07-09 (×2): qty 50

## 2011-07-09 MED ORDER — PROPOFOL 10 MG/ML IV EMUL
INTRAVENOUS | Status: DC | PRN
  Administered 2011-07-09: 170 mg via INTRAVENOUS

## 2011-07-09 MED ORDER — OXYCODONE-ACETAMINOPHEN 5-325 MG PO TABS
1.0000 | ORAL_TABLET | ORAL | Status: DC | PRN
  Administered 2011-07-10 (×2): 2 via ORAL
  Filled 2011-07-09 (×3): qty 2

## 2011-07-09 MED ORDER — BUPIVACAINE-EPINEPHRINE 0.25% -1:200000 IJ SOLN
INTRAMUSCULAR | Status: DC | PRN
  Administered 2011-07-09: 10 mL

## 2011-07-09 MED ORDER — PROPOFOL 10 MG/ML IV EMUL
INTRAVENOUS | Status: DC | PRN
  Administered 2011-07-09: 50 ug/kg/min via INTRAVENOUS

## 2011-07-09 MED ORDER — SODIUM CHLORIDE 0.9 % IV SOLN
10.0000 mg | INTRAVENOUS | Status: DC | PRN
  Administered 2011-07-09: 10 ug/min via INTRAVENOUS

## 2011-07-09 MED ORDER — ACETAMINOPHEN 650 MG RE SUPP: 650.0000 mg | RECTAL | Status: DC | PRN

## 2011-07-09 MED ORDER — GLYCOPYRROLATE 0.2 MG/ML IJ SOLN
INTRAMUSCULAR | Status: DC | PRN
  Administered 2011-07-09: .3 mg via INTRAVENOUS

## 2011-07-09 MED ORDER — DEXAMETHASONE SODIUM PHOSPHATE 4 MG/ML IJ SOLN
INTRAMUSCULAR | Status: DC | PRN
  Administered 2011-07-09: 8 mg via INTRAVENOUS

## 2011-07-09 MED ORDER — SODIUM CHLORIDE 0.9 % IV SOLN: 250.0000 mL | INTRAVENOUS | Status: DC

## 2011-07-09 MED ORDER — DROPERIDOL 2.5 MG/ML IJ SOLN
INTRAMUSCULAR | Status: DC | PRN
  Administered 2011-07-09: 0.625 mg via INTRAVENOUS

## 2011-07-09 MED ORDER — DIPHENHYDRAMINE HCL 50 MG/ML IJ SOLN: 12.5000 mg | Freq: Four times a day (QID) | INTRAMUSCULAR | Status: DC | PRN

## 2011-07-09 MED ORDER — ACETAMINOPHEN 325 MG PO TABS: 650.0000 mg | ORAL_TABLET | ORAL | Status: DC | PRN

## 2011-07-09 MED ORDER — ONDANSETRON HCL 4 MG/2ML IJ SOLN
INTRAMUSCULAR | Status: DC | PRN
  Administered 2011-07-09: 4 mg via INTRAVENOUS

## 2011-07-09 MED ORDER — NALOXONE HCL 0.4 MG/ML IJ SOLN: 0.4000 mg | INTRAMUSCULAR | Status: DC | PRN

## 2011-07-09 MED ORDER — LACTATED RINGERS IV SOLN
INTRAVENOUS | Status: DC
  Administered 2011-07-09: 15:00:00 via INTRAVENOUS

## 2011-07-09 MED ORDER — ROCURONIUM BROMIDE 100 MG/10ML IV SOLN
INTRAVENOUS | Status: DC | PRN
  Administered 2011-07-09: 50 mg via INTRAVENOUS

## 2011-07-09 MED ORDER — CALCIUM-VITAMIN D 185 MG PO TABS: 1000.0000 mg | ORAL_TABLET | Freq: Every day | ORAL | Status: DC

## 2011-07-09 MED ORDER — PHENOL 1.4 % MT LIQD: 1.0000 | OROMUCOSAL | Status: DC | PRN

## 2011-07-09 MED ORDER — MORPHINE SULFATE (PF) 1 MG/ML IV SOLN
INTRAVENOUS | Status: DC
  Administered 2011-07-09: 1 mg via INTRAVENOUS
  Administered 2011-07-09: 23:00:00 via INTRAVENOUS
  Administered 2011-07-10: 3 mg via INTRAVENOUS
  Administered 2011-07-10: 1 mg via INTRAVENOUS
  Filled 2011-07-09: qty 25

## 2011-07-09 MED ORDER — ACETAMINOPHEN 10 MG/ML IV SOLN
INTRAVENOUS | Status: DC | PRN
  Administered 2011-07-09: 1000 mg via INTRAVENOUS

## 2011-07-09 MED ORDER — HYDROMORPHONE HCL PF 1 MG/ML IJ SOLN
0.2500 mg | INTRAMUSCULAR | Status: DC | PRN
  Administered 2011-07-09 (×2): 0.25 mg via INTRAVENOUS

## 2011-07-09 MED ORDER — DIAZEPAM 5 MG PO TABS
5.0000 mg | ORAL_TABLET | Freq: Four times a day (QID) | ORAL | Status: DC | PRN
  Administered 2011-07-12: 5 mg via ORAL
  Filled 2011-07-09: qty 1

## 2011-07-09 MED ORDER — HYDROXYZINE HCL 50 MG PO TABS: 50.0000 mg | ORAL_TABLET | ORAL | Status: DC | PRN

## 2011-07-09 MED ORDER — MORPHINE SULFATE 4 MG/ML IJ SOLN: 2.0000 mg | INTRAMUSCULAR | Status: DC | PRN

## 2011-07-09 MED ORDER — LACTATED RINGERS IV SOLN
INTRAVENOUS | Status: DC | PRN
  Administered 2011-07-09 (×3): via INTRAVENOUS

## 2011-07-09 MED ORDER — MIDAZOLAM HCL 5 MG/5ML IJ SOLN
INTRAMUSCULAR | Status: DC | PRN
  Administered 2011-07-09: 2 mg via INTRAVENOUS

## 2011-07-09 MED ORDER — SODIUM CHLORIDE 0.9 % IJ SOLN: 9.0000 mL | INTRAMUSCULAR | Status: DC | PRN

## 2011-07-09 MED ORDER — THROMBIN 20000 UNITS EX KIT
PACK | CUTANEOUS | Status: DC | PRN
  Administered 2011-07-09: 19:00:00 via TOPICAL

## 2011-07-09 SURGICAL SUPPLY — 74 items
BENZOIN TINCTURE PRP APPL 2/3 (GAUZE/BANDAGES/DRESSINGS) ×2 IMPLANT
BLADE SURG ROTATE 9660 (MISCELLANEOUS) IMPLANT
BUR ROUND PRECISION 4.0 (BURR) ×2 IMPLANT
CAGE CONCORDE BULLET 9X11X27 (Cage) ×1 IMPLANT
CAGE SPNL 5D BLT NOSE 27X9X11 (Cage) ×1 IMPLANT
CARTRIDGE OIL MAESTRO DRILL (MISCELLANEOUS) IMPLANT
CLOSURE STERI STRIP 1/2 X4 (GAUZE/BANDAGES/DRESSINGS) ×2 IMPLANT
CLOTH BEACON ORANGE TIMEOUT ST (SAFETY) ×2 IMPLANT
CONT SPEC STER OR (MISCELLANEOUS) ×2 IMPLANT
CORDS BIPOLAR (ELECTRODE) ×2 IMPLANT
COVER SURGICAL LIGHT HANDLE (MISCELLANEOUS) ×2 IMPLANT
DIFFUSER DRILL AIR PNEUMATIC (MISCELLANEOUS) IMPLANT
DRAIN CHANNEL 15F RND FF W/TCR (WOUND CARE) IMPLANT
DRAPE C-ARM 42X72 X-RAY (DRAPES) ×2 IMPLANT
DRAPE INCISE IOBAN 66X45 STRL (DRAPES) ×2 IMPLANT
DRAPE ORTHO SPLIT 77X108 STRL (DRAPES) ×1
DRAPE POUCH INSTRU U-SHP 10X18 (DRAPES) ×2 IMPLANT
DRAPE SURG 17X23 STRL (DRAPES) ×6 IMPLANT
DRAPE SURG ORHT 6 SPLT 77X108 (DRAPES) ×1 IMPLANT
DURAPREP 26ML APPLICATOR (WOUND CARE) ×2 IMPLANT
ELECT BLADE 4.0 EZ CLEAN MEGAD (MISCELLANEOUS) ×2
ELECT CAUTERY BLADE 6.4 (BLADE) ×2 IMPLANT
ELECT REM PT RETURN 9FT ADLT (ELECTROSURGICAL) ×2
ELECTRODE BLDE 4.0 EZ CLN MEGD (MISCELLANEOUS) ×1 IMPLANT
ELECTRODE REM PT RTRN 9FT ADLT (ELECTROSURGICAL) ×1 IMPLANT
EVACUATOR SILICONE 100CC (DRAIN) IMPLANT
GAUZE SPONGE 4X4 16PLY XRAY LF (GAUZE/BANDAGES/DRESSINGS) ×8 IMPLANT
GLOVE BIO SURGEON STRL SZ8 (GLOVE) ×2 IMPLANT
GLOVE BIOGEL PI IND STRL 8 (GLOVE) ×1 IMPLANT
GLOVE BIOGEL PI INDICATOR 8 (GLOVE) ×1
GOWN PREVENTION PLUS XLARGE (GOWN DISPOSABLE) ×2 IMPLANT
GOWN STRL NON-REIN LRG LVL3 (GOWN DISPOSABLE) ×4 IMPLANT
IV CATH 14GX2 1/4 (CATHETERS) ×2 IMPLANT
KIT BASIN OR (CUSTOM PROCEDURE TRAY) ×2 IMPLANT
KIT POSITION SURG JACKSON T1 (MISCELLANEOUS) ×2 IMPLANT
KIT ROOM TURNOVER OR (KITS) ×2 IMPLANT
MARKER SKIN DUAL TIP RULER LAB (MISCELLANEOUS) ×2 IMPLANT
MILL MEDIUM DISP (BLADE) ×2 IMPLANT
MIX DBX 10CC 35% BONE (Bone Implant) ×4 IMPLANT
NEEDLE BONE MARROW 8GX6 FENEST (NEEDLE) IMPLANT
NEEDLE HYPO 25GX1X1/2 BEV (NEEDLE) ×2 IMPLANT
NEEDLE SPNL 18GX3.5 QUINCKE PK (NEEDLE) ×4 IMPLANT
NS IRRIG 1000ML POUR BTL (IV SOLUTION) ×2 IMPLANT
OIL CARTRIDGE MAESTRO DRILL (MISCELLANEOUS)
PACK LAMINECTOMY ORTHO (CUSTOM PROCEDURE TRAY) ×2 IMPLANT
PACK UNIVERSAL I (CUSTOM PROCEDURE TRAY) ×2 IMPLANT
PAD ARMBOARD 7.5X6 YLW CONV (MISCELLANEOUS) ×4 IMPLANT
PATTIES SURGICAL .5 X1 (DISPOSABLE) ×2 IMPLANT
PATTIES SURGICAL .5X1.5 (GAUZE/BANDAGES/DRESSINGS) ×2 IMPLANT
ROD EXPEDIUM PREBENT 5.5X35MM (Rod) ×4 IMPLANT
SCREW EXPEDIUM POLYAXIAL 6X40 (Screw) ×8 IMPLANT
SCREW SET SINGLE INNER (Screw) ×8 IMPLANT
SPONGE GAUZE 4X4 12PLY (GAUZE/BANDAGES/DRESSINGS) ×2 IMPLANT
SPONGE INTESTINAL PEANUT (DISPOSABLE) ×2 IMPLANT
SPONGE SURGIFOAM ABS GEL 100 (HEMOSTASIS) ×2 IMPLANT
STRIP CLOSURE SKIN 1/2X4 (GAUZE/BANDAGES/DRESSINGS) IMPLANT
SURGIFLO TRUKIT (HEMOSTASIS) ×2 IMPLANT
SUT BONE WAX W31G (SUTURE) ×2 IMPLANT
SUT MNCRL AB 3-0 PS2 18 (SUTURE) ×2 IMPLANT
SUT MNCRL AB 4-0 PS2 18 (SUTURE) ×2 IMPLANT
SUT VIC AB 0 CT1 18XCR BRD 8 (SUTURE) ×1 IMPLANT
SUT VIC AB 0 CT1 8-18 (SUTURE) ×1
SUT VIC AB 1 CT1 18XCR BRD 8 (SUTURE) ×2 IMPLANT
SUT VIC AB 1 CT1 8-18 (SUTURE) ×2
SUT VIC AB 2-0 CT2 18 VCP726D (SUTURE) ×2 IMPLANT
SYR 20CC LL (SYRINGE) ×2 IMPLANT
SYR BULB IRRIGATION 50ML (SYRINGE) ×2 IMPLANT
SYR CONTROL 10ML LL (SYRINGE) ×2 IMPLANT
TAPE CLOTH SURG 4X10 WHT LF (GAUZE/BANDAGES/DRESSINGS) ×2 IMPLANT
TOWEL OR 17X24 6PK STRL BLUE (TOWEL DISPOSABLE) ×2 IMPLANT
TOWEL OR 17X26 10 PK STRL BLUE (TOWEL DISPOSABLE) ×2 IMPLANT
TRAY FOLEY CATH 14FR (SET/KITS/TRAYS/PACK) ×2 IMPLANT
WATER STERILE IRR 1000ML POUR (IV SOLUTION) ×2 IMPLANT
YANKAUER SUCT BULB TIP NO VENT (SUCTIONS) ×4 IMPLANT

## 2011-07-09 NOTE — Anesthesia Postprocedure Evaluation (Signed)
  Anesthesia Post-op Note  Patient: Melissa Kirby  Procedure(s) Performed:  POSTERIOR LUMBAR FUSION 1 LEVEL - LEFT SIDED LUMBAR 4-5 TRANSFORAMINAL LUMBAR INTERBODY FUSION WITH INSTRUMENTATION.  Patient Location: PACU  Anesthesia Type: General  Level of Consciousness: awake  Airway and Oxygen Therapy: Patient connected to nasal cannula oxygen  Post-op Pain: mild  Post-op Assessment: Post-op Vital signs reviewed  Post-op Vital Signs: stable  Complications: No apparent anesthesia complications

## 2011-07-09 NOTE — Anesthesia Procedure Notes (Signed)
Procedure Name: Intubation Date/Time: 07/09/2011 5:42 PM Performed by: Wray Kearns A Pre-anesthesia Checklist: Patient identified, Timeout performed, Emergency Drugs available, Suction available and Patient being monitored Patient Re-evaluated:Patient Re-evaluated prior to inductionOxygen Delivery Method: Circle System Utilized Preoxygenation: Pre-oxygenation with 100% oxygen Intubation Type: IV induction and Circoid Pressure applied Ventilation: Mask ventilation without difficulty Laryngoscope Size: Mac and 3 Grade View: Grade I Tube type: Oral Tube size: 7.0 mm Number of attempts: 1 Airway Equipment and Method: stylet Placement Confirmation: ETT inserted through vocal cords under direct vision,  breath sounds checked- equal and bilateral and positive ETCO2 Secured at: 21 cm Tube secured with: Tape Dental Injury: Teeth and Oropharynx as per pre-operative assessment

## 2011-07-09 NOTE — H&P (Signed)
PREOPERATIVE H&P  Chief Complaint: left leg pain   HPI: Melissa Kirby is a 50 y.o. female who presents with left leg pain  Past Medical History  Diagnosis Date  . VITAMIN B12 DEFICIENCY 09/2009 dx  . Unspecified hypothyroidism 1982/2000    surgical resection of benign growth - partial then complete  . GERD (gastroesophageal reflux disease)     per endo. - does not take any meds.    Marland Kitchen MIGRAINE HEADACHE     last Migraine 06/29/2011-   . ANEMIA, IRON DEFICIENCY   . Kidney stones 1998, 2005    x 2  . Depression     therapy  . Neuromuscular disorder     L eft  tingling  . Fibromyalgia 1999    no meds   Past Surgical History  Procedure Date  . Total thyroidectomy 2000    Had thyroid nodule, nonmalignant ([id number]) resection  . Cholecystectomy 2009  . Tubal ligation 1989  . Diagnostic laparoscopy 1982    diagnostic  . Thyroid surgery 1983    partial    History   Social History  . Marital Status: Married    Spouse Name: N/A    Number of Children: N/A  . Years of Education: N/A   Social History Main Topics  . Smoking status: Never Smoker   . Smokeless tobacco: None   Comment: Married, lives with spouse. 3 children. 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 Alcohol Use: No  . Drug Use: No  . Sexually Active: Yes    Birth Control/ Protection: Surgical     Married lives with spouse, 3 children. 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 None   Social History Narrative  . None   Family History  Problem Relation Age of Onset  . Hyperlipidemia Mother   . Heart disease Mother   . Hyperlipidemia Father   . Breast cancer Maternal Grandmother   . Hyperlipidemia Maternal Grandmother   . Ovarian cancer Other   . Anesthesia problems Neg Hx   . Malignant hyperthermia Neg Hx   . Pseudochol deficiency Neg Hx   . Hypotension Neg Hx    Allergies  Allergen Reactions  . Compazine Anaphylaxis    Hypotension  . Reglan Anaphylaxis      Hypotension  . Morphine And Related     nausea   Prior to Admission medications   Medication Sig Start Date End Date Taking? Authorizing Provider  levothyroxine (SYNTHROID) 150 MCG tablet Take 1 tablet (150 mcg total) by mouth every other day. Or as directed 03/10/11 03/09/12 Yes Rene Paci, MD  levothyroxine (SYNTHROID, LEVOTHROID) 175 MCG tablet Take 1 tablet (175 mcg total) by mouth every other day. 03/10/11  Yes Rene Paci, MD  calcium-vitamin D 185 MG TABS Take 1,000 mg by mouth daily.      Historical Provider, MD  conjugated estrogens (PREMARIN) vaginal cream Place 0.5 g vaginally 3 (three) times a week. Monday, Wednesday and Friday.  10/18/10 10/18/11  Rene Paci, MD  gabapentin (NEURONTIN) 300 MG capsule Take 300 mg by mouth 3 (three) times daily.     Historical Provider, MD  Multiple Vitamin (MULTIVITAMIN) tablet Take 1 tablet by mouth daily.      Historical Provider, MD  nortriptyline (PAMELOR) 50 MG capsule TAKE 1 CAPSULE (50 MG TOTAL) BY MOUTH AT BEDTIME. 06/30/11   Rene Paci, MD     All other systems have been reviewed and were  otherwise negative with the exception of those mentioned in the HPI and as above.  Physical Exam: Filed Vitals:   07/09/11 1117  BP: 118/79  Pulse: 81  Temp: 97.8 F (36.6 C)  Resp: 20    General: Alert, no acute distress Cardiovascular: No pedal edema Respiratory: No cyanosis, no use of accessory musculature GI: No organomegaly, abdomen is soft and non-tender Skin: No lesions in the area of chief complaint Neurologic: Sensation intact distally Psychiatric: Patient is competent for consent with normal mood and affect Lymphatic: No axillary or cervical lymphadenopathy  MUSCULOSKELETAL: + SLR on left, 4/5 EHL on left  Assessment/Plan: LOW BACK PAIN Plan for Procedure(s): POSTERIOR LUMBAR FUSION L4/5 on left   Nakoa Ganus LEONARD 07/09/2011 4:37 PM

## 2011-07-09 NOTE — Anesthesia Preprocedure Evaluation (Signed)
Anesthesia Evaluation  Patient identified by MRN, date of birth, ID band Patient awake    Reviewed: Allergy & Precautions, H&P , NPO status , Patient's Chart, lab work & pertinent test results  Airway Mallampati: II  Neck ROM: full    Dental   Pulmonary          Cardiovascular     Neuro/Psych  Headaches, PSYCHIATRIC DISORDERS Depression  Neuromuscular disease    GI/Hepatic GERD-  ,  Endo/Other  Hypothyroidism   Renal/GU      Musculoskeletal  (+) Fibromyalgia -  Abdominal   Peds  Hematology   Anesthesia Other Findings   Reproductive/Obstetrics                           Anesthesia Physical Anesthesia Plan  ASA: II  Anesthesia Plan: General   Post-op Pain Management:    Induction: Intravenous  Airway Management Planned: Oral ETT  Additional Equipment:   Intra-op Plan:   Post-operative Plan: Extubation in OR  Informed Consent: I have reviewed the patients History and Physical, chart, labs and discussed the procedure including the risks, benefits and alternatives for the proposed anesthesia with the patient or authorized representative who has indicated his/her understanding and acceptance.     Plan Discussed with: CRNA and Surgeon  Anesthesia Plan Comments:         Anesthesia Quick Evaluation

## 2011-07-09 NOTE — Preoperative (Signed)
Beta Blockers   Reason not to administer Beta Blockers:Not Applicable 

## 2011-07-09 NOTE — Transfer of Care (Signed)
Immediate Anesthesia Transfer of Care Note  Patient: Melissa Kirby  Procedure(s) Performed:  POSTERIOR LUMBAR FUSION 1 LEVEL - LEFT SIDED LUMBAR 4-5 TRANSFORAMINAL LUMBAR INTERBODY FUSION WITH INSTRUMENTATION.  Patient Location: PACU  Anesthesia Type: General  Level of Consciousness: awake, alert , oriented and patient cooperative  Airway & Oxygen Therapy: Patient Spontanous Breathing  Post-op Assessment: Report given to PACU RN, Post -op Vital signs reviewed and stable and Patient moving all extremities X 4  Post vital signs: Reviewed and stable  Complications: No apparent anesthesia complications

## 2011-07-10 MED ORDER — SODIUM CHLORIDE 0.9 % IV BOLUS (SEPSIS)
1000.0000 mL | Freq: Once | INTRAVENOUS | Status: AC
  Administered 2011-07-10: 1000 mL via INTRAVENOUS

## 2011-07-10 MED ORDER — CALCIUM CARBONATE-VITAMIN D 500-200 MG-UNIT PO TABS
1.0000 | ORAL_TABLET | Freq: Every day | ORAL | Status: DC
  Administered 2011-07-10 – 2011-07-12 (×3): 1 via ORAL
  Filled 2011-07-10 (×3): qty 1

## 2011-07-10 MED ORDER — SODIUM CHLORIDE 0.9 % IV SOLN
INTRAVENOUS | Status: DC
  Administered 2011-07-11 (×2): via INTRAVENOUS
  Administered 2011-07-11: 1000 mL via INTRAVENOUS
  Administered 2011-07-12: 02:00:00 via INTRAVENOUS

## 2011-07-10 MED ORDER — OXYCODONE HCL 10 MG PO TB12
10.0000 mg | ORAL_TABLET | Freq: Two times a day (BID) | ORAL | Status: DC
  Administered 2011-07-10 – 2011-07-11 (×3): 10 mg via ORAL
  Filled 2011-07-10 (×3): qty 1

## 2011-07-10 NOTE — Progress Notes (Signed)
PT doing well, minimal back pain, no leg pain    AVSS NVI with 5/5 strength b/l Dressing CDI  Drain out > 100cc  POD #1 after L4/5 TLIF with resolved radicular pain  - d/c PCA, start oxycontin - up with PT BID - d/c home tomorrow vs. Saturday

## 2011-07-10 NOTE — Progress Notes (Signed)
Physical Therapy Evaluation Patient Details Name: Melissa Kirby MRN: [id number] DOB: March 11, 1961 Today's Date: 07/10/2011  Problem List:  Patient Active Problem List  Diagnoses  . Unspecified hypothyroidism  . VITAMIN B12 DEFICIENCY  . ANEMIA, IRON DEFICIENCY  . ANEMIA-NOS  . CIRCADIAN RHYTHM SLEEP DISORDER SHIFT WORK TYPE  . MIGRAINE HEADACHE  . GASTRITIS  . GI BLEEDING  . LOSS OF APPETITE  . DYSURIA  . Atrophic vaginitis  . Depression  . Fatigue  . Left ankle pain  . Left leg weakness    Past Medical History:  Past Medical History  Diagnosis Date  . VITAMIN B12 DEFICIENCY 09/2009 dx  . Unspecified hypothyroidism 1982/2000    surgical resection of benign growth - partial then complete  . GERD (gastroesophageal reflux disease)     per endo. - does not take any meds.    Marland Kitchen MIGRAINE HEADACHE     last Migraine 06/29/2011-   . ANEMIA, IRON DEFICIENCY   . Kidney stones 1998, 2005    x 2  . Depression     therapy  . Neuromuscular disorder     L eft  tingling  . Fibromyalgia 1999    no meds   Past Surgical History:  Past Surgical History  Procedure Date  . Total thyroidectomy 2000    Had thyroid nodule, nonmalignant ([id number]) resection  . Cholecystectomy 2009  . Tubal ligation 1989  . Diagnostic laparoscopy 1982    diagnostic  . Thyroid surgery 1983    partial     PT Assessment/Plan/Recommendation PT Assessment Clinical Impression Statement: Pt presents with a medical diagnosis of L4-5 fusion along with the following impairments/defciits and therapy diagnosis listed below. Pt with good functional mobility today. Pt will benefit from skilled PT in the acute care setting in order to maximize functional mobility for a safe d/c home PT Recommendation/Assessment: Patient will need skilled PT in the acute care venue PT Problem List: Decreased strength;Decreased range of motion;Decreased activity tolerance;Decreased mobility;Decreased knowledge of use of DME;Decreased  knowledge of precautions;Pain PT Therapy Diagnosis : Acute pain;Abnormality of gait PT Plan PT Frequency: Min 5X/week PT Treatment/Interventions: DME instruction;Gait training;Stair training;Functional mobility training;Therapeutic activities;Therapeutic exercise;Patient/family education PT Recommendation Follow Up Recommendations: Home health PT;24 hour supervision/assistance Equipment Recommended: Rolling walker with 5" wheels PT Goals  Acute Rehab PT Goals PT Goal Formulation: With patient Time For Goal Achievement: 7 days Pt will go Supine/Side to Sit: with modified independence PT Goal: Supine/Side to Sit - Progress: Progressing toward goal Pt will go Sit to Supine/Side: with modified independence PT Goal: Sit to Supine/Side - Progress: Progressing toward goal Pt will go Sit to Stand: with modified independence PT Goal: Sit to Stand - Progress: Progressing toward goal Pt will go Stand to Sit: with modified independence PT Goal: Stand to Sit - Progress: Progressing toward goal Pt will Transfer Bed to Chair/Chair to Bed: with modified independence PT Transfer Goal: Bed to Chair/Chair to Bed - Progress: Progressing toward goal Pt will Ambulate: >150 feet;with supervision;with least restrictive assistive device PT Goal: Ambulate - Progress: Progressing toward goal Pt will Go Up / Down Stairs: 1-2 stairs;with supervision;with rail(s) PT Goal: Up/Down Stairs - Progress: Progressing toward goal Pt will Perform Home Exercise Program: Independently PT Goal: Perform Home Exercise Program - Progress: Progressing toward goal  PT Evaluation Precautions/Restrictions  Precautions Precautions: Back Restrictions Weight Bearing Restrictions: No Prior Functioning  Home Living Lives With: Spouse Receives Help From: Family Type of Home: House Home Layout: One level Home Access:  Stairs to enter Entrance Stairs-Rails: None Entrance Stairs-Number of Steps: 1 Bathroom Shower/Tub: Tub/shower  unit;Walk-in shower Bathroom Toilet: Standard Bathroom Accessibility: Yes How Accessible: Accessible via walker Home Adaptive Equipment: Straight cane;Bedside commode/3-in-1;Crutches Prior Function Level of Independence: Independent with basic ADLs;Independent with gait;Independent with transfers (difficulty with homemaking) Able to Take Stairs?: Yes Driving: Yes Vocation: Full time employment Cognition Cognition Arousal/Alertness: Awake/alert Overall Cognitive Status: Appears within functional limits for tasks assessed Orientation Level: Oriented X4 Sensation/Coordination Sensation Light Touch: Appears Intact Extremity Assessment RLE Assessment RLE Assessment: Within Functional Limits LLE Assessment LLE Assessment: Within Functional Limits Mobility (including Balance) Bed Mobility Bed Mobility: Yes Rolling Right: 6: Modified independent (Device/Increase time) Right Sidelying to Sit: 3: Mod assist;With rails;HOB elevated (comment degrees) (30) Right Sidelying to Sit Details (indicate cue type and reason): VC for sequencing to maintain back precautions. Assist with trunk control secondary to weakness upon sitting Sitting - Scoot to Edge of Bed: 5: Supervision Sitting - Scoot to Edge of Bed Details (indicate cue type and reason): VC for hand placement Transfers Transfers: Yes Sit to Stand: 4: Min assist;From bed;With upper extremity assist Sit to Stand Details (indicate cue type and reason): VC for hand placement. Difficulty with initiation Stand to Sit: 4: Min assist;To chair/3-in-1;With upper extremity assist Stand to Sit Details: VC for hand placement and safety Ambulation/Gait Ambulation/Gait: Yes Ambulation/Gait Assistance: 4: Min assist Ambulation/Gait Assistance Details (indicate cue type and reason): VC for sequencing. Slight unsteadiness at beginning of ambulation; RW used for beginning of ambulation, hand held assist or IV pole for end. Discussed with pt importance of  an AD for stability with ambulation Ambulation Distance (Feet): 200 Feet Assistive device: Rolling walker;Other (Comment);1 person hand held assist (IV pole) Gait Pattern: Decreased stride length Gait velocity: SLOW cadence Stairs: Yes Stairs Assistance: 4: Min assist Stairs Assistance Details (indicate cue type and reason): VC for proper sequencing Stair Management Technique: One rail Left;Forwards;Step to pattern Number of Stairs: 1   Balance Balance Assessed: No Exercise    End of Session PT - End of Session Equipment Utilized During Treatment: Gait belt Activity Tolerance: Patient tolerated treatment well Patient left: in chair;with call bell in reach Nurse Communication: Mobility status for transfers;Mobility status for ambulation General Behavior During Session: [facility] for tasks performed Cognition: [facility] for tasks performed  Milana Kidney 07/10/2011, 9:46 AM  07/10/2011 Milana Kidney DPT PAGER: [phone] OFFICE: [phone]

## 2011-07-10 NOTE — Op Note (Signed)
NAME:  NATTALY, YEBRA NO.:  [id number]  MEDICAL RECORD NO.:  [id number]  LOCATION:  5008                         FACILITY:  [facility]  PHYSICIAN:  Estill Bamberg, MD      DATE OF BIRTH:  1961/05/20  DATE OF PROCEDURE:  07/09/2011 DATE OF DISCHARGE:                              OPERATIVE REPORT   PREOPERATIVE DIAGNOSES: 1. Left-sided L5 radiculopathy. 2. Left-sided L4-5 spinal stenosis 3. L4-5 spondylolisthesis.  POSTOPERATIVE DIAGNOSES: 1. Left-sided L5 radiculopathy. 2. Left-sided L4-5 spinal stenosis. 3. L4-5 spondylolisthesis.  PROCEDURES: 1. Left-sided L4-5 transforaminal lumbar interbody fusion. 2. Right-sided posterolateral fusion. 3. Placement of posterior instrumentation L4, L5. 4. Placement of interbody device x1 (11-mm Concorde bulleted cage). 5. Use of local autograft. 6. Use of morselized allograft. 7. Intraoperative use of fluoroscopy.  SURGEON:  Estill Bamberg, MD  ASSISTANT:  None.  ANESTHESIA:  General endotracheal anesthesia.  COMPLICATIONS:  None.  DISPOSITION:  Stable.  ESTIMATED BLOOD LOSS:  300 mL.  INDICATIONS FOR PROCEDURE:  Briefly, Mr. Goodin is a very pleasant 50- year-old female, who was referred to me with severe debilitating pain in her left leg.  She initially was worked up for pathology regarding her ankle.  However, an MRI was ultimately obtained, and it was notable for left-sided L4-5 disk protrusion with a grade 1 L4-5 spondylolisthesis. The protrusion was clearly noted to be causing compression of the traversing L5 nerve.  The patient then was ultimately seen by Dr. Maurice Small and did have a total of 2 epidural injections.  The patient states that each injection helped her for approximately 1 hour but pain did recur.  Given her ongoing debilitating pain, we did have a discussion regarding going forward with a left-sided L4-5 transforaminal lumbar interbody fusion.  The patient fully understood the risks  and limitations of the procedure as outlined in my preoperative note.  OPERATIVE DETAILS:  On July 09, 2011, the patient was brought to surgery and general endotracheal anesthesia was administered.  The patient was placed prone on a well-padded Jackson spinal frame as well was on a flat spinal bed with a Wilson frame.  All bony prominences were meticulously padded.  SCDs were placed and antibiotics were given.  A time-out procedure was performed.  Then, prepped and draped back in the usual fashion.  Two 18-gauge spinal needles were placed over the spinous processes and a lateral intraoperative radiograph was obtained to help optimize the location of my incision and to help optimize the trajectory of the pedicles instrumentation.  I then made an incision from approximately spinous process of L3 to approximately spinous process of L5.  The fascia was then sharply incised in the midline and the paraspinal musculature was bluntly swept laterally.  The lamina of L4 and L5 was readily identified.  A lateral fluoroscopic view to confirm the appropriate operative levels.  The posterolateral gutters on both the right and left sides were subperiosteally exposed.  The transverse processes were subperiosteally exposed.  Using lateral fluoroscopy, I then went forward with cannulating the L4 and L5 pedicles on both the right and the left side.  A 4 mm high-speed bur was utilized followed by a  gearshift probe.  A ball-tip probe was also used to confirm that there was no cortical violation.  A 5-mm tap was used at each pedicle.  I then filled the holes with bone wax and turned and packed the right posterolateral gutter with Ray-Tec soaked with thrombin.  I then turned my attention towards the left side.  A facetectomy was performed using an osteotome in addition to a series of Kerrison punches.  The inferior articular process of L4 and the superior articular process of L5 was completely removed.   The ligamentum flavum was identified and taken down.  The traversing L5 nerve was readily noted and gently swept laterally.  A large and prominent disk herniation was readily identified.  With an assistant holding medial retraction of the L5 nerve, I did use a 15 blade knife to perform an annulotomy in the posterolateral aspect of the L4-5 intervertebral disk.  I then used a series of paddles, scrapers with the largest scraper being an 11-mm scraper.  Then, used a series of curettes in addition to a series of pituitary rongeurs, and I did perform a thorough diskectomy.  I then placed a series of trials.  I placed an 11-mm trial and I did feel that this will be the most appropriate fit.  A lateral fluoroscopic view did confirm appropriate positioning of the trial.  I then selected an 11-mm Concorde bulleted trial and this was packed with the autograft obtained from the facetectomy in addition to DBX bone matrix.  The intervertebral space was then liberally packed with the DBX and autograft, and the 11- mm implant was tamped into position in the usual fashion under AP and lateral fluoroscopy.  I was very happy with the final press fit.  I then placed a 6 x 40 mm screws into the L4 and L5 pedicles on both the right and the left sides.  A 35-mm rod was placed across the screws.  Of note, I did use neurologic monitoring.  I did use triggered EMG to confirm that there was no cortical violation of the screws, and there was no motor evoked potential but occurred at less than 14 milliamps.  If caps were then placed over the rods, then a final locking procedure was performed.  Of note, prior to placing the instrumentation on the right side, I did use a 4 mm high-speed bur and I did decorticate the posterior elements and the transverse processes of L4 and L5.  The remainder of the DBX mix and autograft was placed across the posterior elements and along the posterolateral gutter prior to placing  the hardware.  I also irrigated the wound with approximately 1 L of normal saline prior to placing the instrumentation and the allograft and autograft.  AP and lateral fluoroscopic views were obtained, and I was again, very pleased with the final press fit and the final appearance on the radiographs.  I then turned my attention towards controlling all epidural bleeding.  All epidural bleeding was controlled using FloSeal in addition to bipolar electrocautery.  A Blake drain was placed deep to the fascia in the epidural space.  I then closed the fascia using #1 Vicryl.  The subcutaneous layer was closed using 2-0 Vicryl, and the skin was closed using 3-0 Monocryl.  Benzoin and Steri-Strips were then applied.  The patient was awakened from general endotracheal anesthesia and transferred to recovery in stable condition.     Estill Bamberg, MD     MD/MEDQ  D:  07/09/2011  T:  07/10/2011  Job:  [id number]  cc:   Raenette Rover. Felicity Coyer, MD Frazier Butt, D.O. Buford Dresser, M.D.

## 2011-07-11 ENCOUNTER — Inpatient Hospital Stay (HOSPITAL_COMMUNITY): Payer: 59

## 2011-07-11 LAB — CBC
HCT: 23.7 % — ABNORMAL LOW (ref 36.0–46.0)
Hemoglobin: 7.7 g/dL — ABNORMAL LOW (ref 12.0–15.0)
MCH: 30.3 pg (ref 26.0–34.0)
MCHC: 32.5 g/dL (ref 30.0–36.0)
MCV: 93.3 fL (ref 78.0–100.0)
Platelets: 177 10*3/uL (ref 150–400)
RBC: 2.54 MIL/uL — ABNORMAL LOW (ref 3.87–5.11)
RDW: 13.3 % (ref 11.5–15.5)
WBC: 10.9 10*3/uL — ABNORMAL HIGH (ref 4.0–10.5)

## 2011-07-11 LAB — PREPARE RBC (CROSSMATCH)

## 2011-07-11 MED ORDER — HYDROCODONE-ACETAMINOPHEN 5-325 MG PO TABS
1.0000 | ORAL_TABLET | Freq: Four times a day (QID) | ORAL | Status: DC | PRN
  Administered 2011-07-11 – 2011-07-12 (×2): 1 via ORAL
  Administered 2011-07-12: 2 via ORAL
  Filled 2011-07-11: qty 2
  Filled 2011-07-11: qty 1
  Filled 2011-07-11: qty 2

## 2011-07-11 MED ORDER — OXYCODONE-ACETAMINOPHEN 5-325 MG PO TABS: 1.0000 | ORAL_TABLET | ORAL | Status: AC | PRN

## 2011-07-11 MED ORDER — DIAZEPAM 5 MG PO TABS: 5.0000 mg | ORAL_TABLET | Freq: Four times a day (QID) | ORAL | Status: AC | PRN

## 2011-07-11 MED ORDER — ESTROGENS, CONJUGATED 0.625 MG/GM VA CREA
0.5000 | TOPICAL_CREAM | VAGINAL | Status: DC
  Administered 2011-07-11: 0.5 via VAGINAL
  Filled 2011-07-11: qty 42.5

## 2011-07-11 MED ORDER — OXYCODONE HCL 10 MG PO TB12: 10.0000 mg | ORAL_TABLET | Freq: Two times a day (BID) | ORAL | Status: AC

## 2011-07-11 MED ORDER — ONDANSETRON HCL 4 MG PO TABS
4.0000 mg | ORAL_TABLET | Freq: Four times a day (QID) | ORAL | Status: DC | PRN
  Administered 2011-07-11 – 2011-07-12 (×2): 4 mg via ORAL
  Filled 2011-07-11 (×2): qty 1

## 2011-07-11 MED ORDER — ONDANSETRON HCL 4 MG PO TABS
4.0000 mg | ORAL_TABLET | Freq: Once | ORAL | Status: AC
  Administered 2011-07-11: 4 mg via ORAL
  Filled 2011-07-11: qty 1

## 2011-07-11 MED FILL — Sodium Chloride IV Soln 0.9%: INTRAVENOUS | Qty: 1000 | Status: AC

## 2011-07-11 MED FILL — Heparin Sodium (Porcine) Inj 1000 Unit/ML: INTRAMUSCULAR | Qty: 30 | Status: AC

## 2011-07-11 MED FILL — Sodium Chloride Irrigation Soln 0.9%: Qty: 3000 | Status: AC

## 2011-07-11 NOTE — Progress Notes (Signed)
Patient became extremely nauseated after pain medication this am.  Dr. Yevette Edwards notified and zofran ordered and administered.  Patient now with temp of 100.6 and HR of 131.  She is questioning discharge.  Will contact Dr. Yevette Edwards.

## 2011-07-11 NOTE — Discharge Summary (Signed)
NAME:  Melissa Kirby, Melissa Kirby NO.:  [id number]  MEDICAL RECORD NO.:  [id number]  LOCATION:  5008                         FACILITY:  [facility]  PHYSICIAN:  Estill Bamberg, MD      DATE OF BIRTH:  [birth year]  DATE OF ADMISSION:  07/09/2011 DATE OF DISCHARGE:  07/12/2011                              DISCHARGE SUMMARY   ADMISSION DIAGNOSES: 1. Left-sided L5 radiculopathy. 2. L4-5 spondylolisthesis.  DISCHARGE DIAGNOSES: 1. Left-sided L5 radiculopathy. 2. L4-5 spondylolisthesis.  ADMISSION HISTORY:  Briefly, [name] is a very pleasant 50 year old female, who was referred to me with severe debilitating pain in her left leg.  An MRI was notable for spinal stenosis which was resulting in left- sided leg pain.  The patient was also noted to have a spondylolisthesis, and the patient was admitted on July 09, 2011, for a left-sided transforaminal lumbar interbody fusion.  HOSPITAL COURSE:  On July 09, 2011, the patient was brought to surgery, underwent the procedure noted above.  The patient tolerated the procedure well, was transferred to recovery in stable condition.  The patient was evaluated by me in the morning of postop day #1 and #2.  The patient had a JP drain that was discontinued on the evening of postop day #1.  Of note, the drain was pulled out of the patient's back when she twisted.  However, the output of the drain was only 50 mL leading up to the withdrawal of the drain.  The patient was noted to be neurovascularly intact throughout her hospital stay, and her left leg pain was entirely resolved.  The patient's pain was well controlled on the morning of postop day #2.  On the evening of POD #2 the patient had nausea and vomitting and felt ill overall.  Hg was checked and was 7.7.  2 U prbcs were given and the patient was followed until the following day, when she was noted to be ambulating well with resolved nausea and vomitting.  Hg did appropriately increase  following transfusion. Of note, the patient did get physical therapy throughout her hospital stay.  DISCHARGE INSTRUCTIONS:  The patient will take Percocet and OxyContin for pain and Valium for spasms.  The patient will follow up in my office in approximately 2 weeks.  The patient was educated on back precautions, and she will adhere to those precautions at all times.     Estill Bamberg, MD     MD/MEDQ  D:  07/11/2011  T:  07/11/2011  Job:  [id number]

## 2011-07-11 NOTE — Progress Notes (Signed)
Pt doing well.  Minimal pain.  Episodes of hypotension, currently resolved.  Patient states her BP runs low.  AVSS NVI Dressing CDI  POD #2 after L4/5 TLIF with resolved radicular pain  - d/c home today - f/u 2 weeks

## 2011-07-11 NOTE — Progress Notes (Signed)
Physical Therapy Treatment Patient Details Name: SHEKETA ENDE MRN: [id number] DOB: 1960-08-11 Today's Date: 07/11/2011  PT Assessment/Plan  PT - Assessment/Plan Comments on Treatment Session: Pt limited by nausea this AM. Moving well just not tolerating increase mobility with the nausea PT Plan: Discharge plan remains appropriate PT Frequency: Min 5X/week Follow Up Recommendations: Home health PT;24 hour supervision/assistance Equipment Recommended: Rolling walker with 5" wheels PT Goals  Acute Rehab PT Goals PT Goal: Supine/Side to Sit - Progress: Met PT Goal: Sit to Stand - Progress: Progressing toward goal PT Goal: Stand to Sit - Progress: Progressing toward goal PT Transfer Goal: Bed to Chair/Chair to Bed - Progress: Progressing toward goal PT Goal: Ambulate - Progress: Progressing toward goal  PT Treatment Precautions/Restrictions  Precautions Precautions: Back Restrictions Weight Bearing Restrictions: Yes Mobility (including Balance) Bed Mobility Rolling Right: 6: Modified independent (Device/Increase time) Right Sidelying to Sit: 6: Modified independent (Device/Increase time) Sitting - Scoot to Edge of Bed: 6: Modified independent (Device/Increase time) Transfers Sit to Stand: 5: Supervision;From chair/3-in-1;From bed;With upper extremity assist Sit to Stand Details (indicate cue type and reason): Cues for safe hand placement Stand to Sit: 5: Supervision;To chair/3-in-1;With armrests Stand to Sit Details: Cues to back all the way back up to chair prior to sitting. Cues for safe hand placement Ambulation/Gait Ambulation/Gait Assistance: 5: Supervision Ambulation/Gait Assistance Details (indicate cue type and reason): Cues to stay close to RW and stand upright. Cues for no twisting.  Ambulation Distance (Feet): 30 Feet Assistive device: Rolling walker Gait Pattern: Step-to pattern Stairs: No    Exercise    End of Session PT - End of Session Activity Tolerance:  Other (comment) (Pt limited by nausea) Patient left: in chair;with call bell in reach General Behavior During Session: [facility] for tasks performed Cognition: [facility] for tasks performed  Fredrich Birks 07/11/2011, 12:01 PM 07/11/2011 Fredrich Birks PTA [phone] pager [phone] office

## 2011-07-11 NOTE — Progress Notes (Signed)
Informed of pt with BP of 99/62, HR 126.  Talked with patient, who feels naseous and weak.  Denies abd pain.  Per nurse Stepanie, no pain to palpation of abdomen.  Hb is 7.7.  EBL from surgery was 300 cc.  A/P - patient with hypotension and low blood count s/p lumbar decompression and fusion procedure.  Low blood count and hypotension likely secondary to acute blood loss anemia and drain output postoperatively.  Per patient, she has been told by PCP that her blood "reserves are low".    - STAT abdominal radiographs - 2 U prbcs and f/u cbc in am tomorrow - continue to follow BP and HR - start IV and NS at 100cc/hr - will continue to follow closely

## 2011-07-12 LAB — TYPE AND SCREEN
ABO/RH(D): O POS
Antibody Screen: NEGATIVE
Unit division: 0
Unit division: 0

## 2011-07-12 LAB — CBC
HCT: 31.4 % — ABNORMAL LOW (ref 36.0–46.0)
Hemoglobin: 10.4 g/dL — ABNORMAL LOW (ref 12.0–15.0)
MCH: 30.3 pg (ref 26.0–34.0)
MCHC: 33.1 g/dL (ref 30.0–36.0)
MCV: 91.5 fL (ref 78.0–100.0)
Platelets: 175 10*3/uL (ref 150–400)
RBC: 3.43 MIL/uL — ABNORMAL LOW (ref 3.87–5.11)
RDW: 14.1 % (ref 11.5–15.5)
WBC: 10.1 10*3/uL (ref 4.0–10.5)

## 2011-07-12 NOTE — Progress Notes (Signed)
Subjective: 3 Days Post-Op Procedure(s) (LRB): POSTERIOR LUMBAR FUSION 1 LEVEL (Left)  Activity level: barely OOB Diet tolerance:light regular and no N/V Voiding with or without catheter:voiding well without cath Patient reports pain as 3 on 0-10 scale.    Objective: Vital signs in last 24 hours: Temp:  [98 F (36.7 C)-100.6 F (38.1 C)] 98.6 F (37 C) (12/22 0640) Pulse Rate:  [97-131] 100  (12/22 0640) Resp:  [16-18] 18  (12/22 0640) BP: (104-114)/(67-80) 108/73 mmHg (12/22 0640) SpO2:  [92 %-98 %] 97 % (12/22 0640)  Intake/Output from previous day: 12/21 0701 - 12/22 0700 In: 2820 [P.O.:720; I.V.:1400; Blood:700] Out: -  Intake/Output this shift:     Basename 07/12/11 0600 07/11/11 0638  HGB 10.4* 7.7*    Basename 07/12/11 0600 07/11/11 0638  WBC 10.1 10.9*  RBC 3.43* 2.54*  HCT 31.4* 23.7*  PLT 175 177   No results found for this basename: NA:2,K:2,CL:2,CO2:2,BUN:2,CREATININE:2,GLUCOSE:2,CALCIUM:2 in the last 72 hours No results found for this basename: LABPT:2,INR:2 in the last 72 hours  Neurologically intact ABD soft Neurovascular intact Sensation intact distally Intact pulses distally Dorsiflexion/Plantar flexion intact Bowel sounds absent Dressings dry on back   Assessment/Plan: 3 Days Post-Op Procedure(s) (LRB): POSTERIOR LUMBAR FUSION 1 LEVEL (Left) Advance diet Up with therapy D/C IV fluids Plan for discharge tomorrow Seems better today but needs to mobilize Ileus likely but no nausea or vomiting  Melissa Kirby 07/12/2011, 7:48 AM

## 2011-07-12 NOTE — Progress Notes (Signed)
Physical Therapy Treatment Patient Details Name: Melissa Kirby MRN: [id number] DOB: 1961-07-06 Today's Date: 07/12/2011  PT Assessment/Plan  PT - Assessment/Plan Comments on Treatment Session: Treatment limited by nausea upon standing. Pt requested to get back into bed. Will attempt this afternoon for ambulation trial. PT Plan: Discharge plan remains appropriate PT Frequency: Min 5X/week Follow Up Recommendations: Home health PT;24 hour supervision/assistance Equipment Recommended: Rolling walker with 5" wheels PT Goals  Acute Rehab PT Goals PT Goal Formulation: With patient PT Goal: Supine/Side to Sit - Progress: Progressing toward goal PT Goal: Sit to Supine/Side - Progress: Progressing toward goal PT Goal: Sit to Stand - Progress: Progressing toward goal PT Goal: Stand to Sit - Progress: Met PT Transfer Goal: Bed to Chair/Chair to Bed - Progress: Other (comment) (NT ) PT Goal: Ambulate - Progress: Other (comment) (NT) PT Goal: Up/Down Stairs - Progress: Other (comment) (NT) PT Goal: Perform Home Exercise Program - Progress: Other (comment) (NT)  PT Treatment Precautions/Restrictions  Precautions Precautions: Back Restrictions Weight Bearing Restrictions: Yes Mobility (including Balance) Bed Mobility Bed Mobility: Yes Rolling Right: 5: Supervision;With rail Rolling Right Details (indicate cue type and reason): VC for sequencing to maintain back precautions Right Sidelying to Sit: 6: Modified independent (Device/Increase time);With rails Sitting - Scoot to Edge of Bed: 6: Modified independent (Device/Increase time) Transfers Transfers: Yes Sit to Stand: 5: Supervision;From bed;With upper extremity assist Sit to Stand Details (indicate cue type and reason): VC for hand placement and safety (Pt with increased nauseau upon standing) Stand to Sit: 5: Supervision;To bed;With upper extremity assist Stand to Sit Details: VC for hand placement Ambulation/Gait Ambulation/Gait: No      Exercise    End of Session PT - End of Session Equipment Utilized During Treatment: Gait belt Activity Tolerance: Treatment limited secondary to medical complications (Comment) (nauseau) Patient left: in bed;with call bell in reach Nurse Communication: Mobility status for transfers;Mobility status for ambulation General Behavior During Session: [facility] for tasks performed Cognition: [facility] for tasks performed  Milana Kidney 07/12/2011, 9:20 AM  07/12/2011 Milana Kidney DPT PAGER: [phone] OFFICE: [phone]

## 2011-07-12 NOTE — Progress Notes (Signed)
Physical Therapy Treatment Patient Details Name: AUDRINA MARTEN MRN: [id number] DOB: 1960/09/09 Today's Date: 07/12/2011  PT Assessment/Plan  PT - Assessment/Plan Comments on Treatment Session: Pt feeling better this afternoon and was able to ambulate without any nausea. Pt is safe to d/c home pending medical progress PT Plan: Discharge plan remains appropriate PT Frequency: Min 5X/week Follow Up Recommendations: Home health PT;24 hour supervision/assistance Equipment Recommended: Rolling walker with 5" wheels PT Goals  Acute Rehab PT Goals PT Goal Formulation: With patient PT Goal: Supine/Side to Sit - Progress: Met PT Goal: Sit to Supine/Side - Progress: Met PT Goal: Sit to Stand - Progress: Progressing toward goal PT Goal: Stand to Sit - Progress: Progressing toward goal PT Transfer Goal: Bed to Chair/Chair to Bed - Progress: Progressing toward goal PT Goal: Ambulate - Progress: Met PT Goal: Up/Down Stairs - Progress: Met PT Goal: Perform Home Exercise Program - Progress: Met  PT Treatment Precautions/Restrictions  Precautions Precautions: Back Restrictions Weight Bearing Restrictions: No Mobility (including Balance) Bed Mobility Bed Mobility: Yes Rolling Right: 5: Supervision;With rail Rolling Right Details (indicate cue type and reason): VC to maintain back precautions with log rolling Right Sidelying to Sit: 6: Modified independent (Device/Increase time);With rails Transfers Transfers: Yes Sit to Stand: 5: Supervision;From bed;With upper extremity assist Sit to Stand Details (indicate cue type and reason): VC for hand placement to RW Stand to Sit: 5: Supervision;To chair/3-in-1;With upper extremity assist Stand to Sit Details: VC for hand placement and safety Ambulation/Gait Ambulation/Gait: Yes Ambulation/Gait Assistance: 5: Supervision Ambulation/Gait Assistance Details (indicate cue type and reason): VC throughout for postural cues to maintain upright posture  throughout ambulation and for safety with distance to RW. Ambulation Distance (Feet): 150 Feet Assistive device: Rolling walker Gait Pattern: Step-to pattern Gait velocity: decreased gait speed Stairs: No    Exercise  General Exercises - Lower Extremity Hip Flexion/Marching: AROM;Strengthening;Both;10 reps;Standing End of Session PT - End of Session Equipment Utilized During Treatment: Gait belt Activity Tolerance: Patient tolerated treatment well Patient left: in chair;with call bell in reach Nurse Communication: Mobility status for transfers;Mobility status for ambulation General Behavior During Session: [facility] for tasks performed Cognition: [facility] for tasks performed  Milana Kidney 07/12/2011, 2:54 PM  07/12/2011 Milana Kidney DPT PAGER: [phone] OFFICE: [phone]

## 2011-07-25 ENCOUNTER — Other Ambulatory Visit: Payer: Self-pay | Admitting: Internal Medicine

## 2011-08-21 ENCOUNTER — Encounter: Payer: Self-pay | Admitting: Internal Medicine

## 2011-08-21 ENCOUNTER — Other Ambulatory Visit (INDEPENDENT_AMBULATORY_CARE_PROVIDER_SITE_OTHER): Payer: 59

## 2011-08-21 ENCOUNTER — Ambulatory Visit (INDEPENDENT_AMBULATORY_CARE_PROVIDER_SITE_OTHER): Payer: 59 | Admitting: Internal Medicine

## 2011-08-21 VITALS — BP 112/82 | HR 108 | Temp 97.9°F | Wt 128.4 lb

## 2011-08-21 DIAGNOSIS — D62 Acute posthemorrhagic anemia: Secondary | ICD-10-CM

## 2011-08-21 DIAGNOSIS — E039 Hypothyroidism, unspecified: Secondary | ICD-10-CM

## 2011-08-21 LAB — CBC WITH DIFFERENTIAL/PLATELET
Basophils Absolute: 0 10*3/uL (ref 0.0–0.1)
Basophils Relative: 0.3 % (ref 0.0–3.0)
Eosinophils Absolute: 0.1 10*3/uL (ref 0.0–0.7)
Eosinophils Relative: 1.8 % (ref 0.0–5.0)
HCT: 34.4 % — ABNORMAL LOW (ref 36.0–46.0)
Hemoglobin: 11.7 g/dL — ABNORMAL LOW (ref 12.0–15.0)
Lymphocytes Relative: 33.3 % (ref 12.0–46.0)
Lymphs Abs: 1.8 10*3/uL (ref 0.7–4.0)
MCHC: 34.1 g/dL (ref 30.0–36.0)
MCV: 91.9 fl (ref 78.0–100.0)
Monocytes Absolute: 0.8 10*3/uL (ref 0.1–1.0)
Monocytes Relative: 15.2 % — ABNORMAL HIGH (ref 3.0–12.0)
Neutro Abs: 2.7 10*3/uL (ref 1.4–7.7)
Neutrophils Relative %: 49.4 % (ref 43.0–77.0)
Platelets: 251 10*3/uL (ref 150.0–400.0)
RBC: 3.74 Mil/uL — ABNORMAL LOW (ref 3.87–5.11)
RDW: 13.1 % (ref 11.5–14.6)
WBC: 5.5 10*3/uL (ref 4.5–10.5)

## 2011-08-21 LAB — TSH: TSH: 0.16 u[IU]/mL — ABNORMAL LOW (ref 0.35–5.50)

## 2011-08-21 NOTE — Assessment & Plan Note (Signed)
S/p total thyroidectomy 2000 Dose adjustment 12/2010 due to inc TSH - then slightly low 01/2011 but no dose change recheck now - titrate as needed  Lab Results  Component Value Date   TSH 0.23* 06/17/2011

## 2011-08-21 NOTE — Progress Notes (Signed)
  Subjective:    Patient ID: Melissa Kirby, female    DOB: 1961-06-03, 51 y.o.   MRN: [id number]  HPI  Here for postop check - L1 post fusion 07/09/11 Feels fatigued and heart racing - ?still anemia (ABL periop)  Past Medical History  Diagnosis Date  . VITAMIN B12 DEFICIENCY 09/2009 dx  . Unspecified hypothyroidism 1982/2000    surgical resection of benign growth - partial then complete  . GERD (gastroesophageal reflux disease)     per endo. - does not take any meds.    Marland Kitchen MIGRAINE HEADACHE     last Migraine 06/29/2011-   . ANEMIA, IRON DEFICIENCY   . Kidney stones 1998, 2005    x 2  . Depression     therapy  . Neuromuscular disorder     L eft  tingling  . Fibromyalgia 1999    no meds    Review of Systems  Constitutional: Positive for fatigue. Negative for fever and unexpected weight change.  Respiratory: Negative for cough and shortness of breath.   Cardiovascular: Positive for palpitations. Negative for chest pain.  Gastrointestinal: Negative for abdominal pain, blood in stool and anal bleeding.  Genitourinary: Negative for dysuria and hematuria.       Objective:   Physical Exam BP 112/82  Pulse 108  Temp(Src) 97.9 F (36.6 C) (Oral)  Wt 128 lb 6.4 oz (58.242 kg)  SpO2 99% Wt Readings from Last 3 Encounters:  08/21/11 128 lb 6.4 oz (58.242 kg)  07/09/11 129 lb (58.514 kg)  07/09/11 129 lb (58.514 kg)   Constitutional: She appears well-developed and well-nourished. No distress.  Neck: Normal range of motion. Neck supple. No JVD present. No thyromegaly present.  Cardiovascular: Normal rate, regular rhythm and normal heart sounds.  No murmur heard. No BLE edema. Pulmonary/Chest: Effort normal and breath sounds normal. No respiratory distress. She has no wheezes.  Abdominal: Soft. Bowel sounds are normal. She exhibits no distension. There is no tenderness. no masses Psychiatric: She has a normal mood and affect. Her behavior is normal. Judgment and thought content  normal.   Lab Results  Component Value Date   WBC 10.1 07/12/2011   HGB 10.4* 07/12/2011   HCT 31.4* 07/12/2011   PLT 175 07/12/2011   GLUCOSE 86 07/04/2011   CHOL 178 10/03/2009   TRIG 71.0 10/03/2009   HDL 70.50 10/03/2009   LDLCALC 93 10/03/2009   ALT 17 07/04/2011   AST 22 07/04/2011   NA 139 07/04/2011   K 3.9 07/04/2011   CL 105 07/04/2011   CREATININE 0.75 07/04/2011   BUN 7 07/04/2011   CO2 25 07/04/2011   TSH 0.23* 06/17/2011   INR 1.02 07/04/2011        Assessment & Plan:  LBP s/p posterior L1 fusion 07/09/11 - holding on PT x 3 mo to allow fusion - complicated by ABL pain  ABL anemia - expect periop loss - s/p 2U PRBC transfusion postop - recheck now given fatigue and tachycardia - no hx for GI loss or other bleeding

## 2011-08-21 NOTE — Patient Instructions (Signed)
It was good to see you today. We have reviewed your hospital records including labs and tests today Test(s) ordered today. Your results will be called to you after review (48-72hours after test completion). If any changes need to be made, you will be notified at that time. Medications reviewed, no changes at this time. Please keep scheduled followup to review symptoms and medications, call sooner if problems.

## 2011-09-17 ENCOUNTER — Ambulatory Visit (HOSPITAL_COMMUNITY)
Admission: RE | Admit: 2011-09-17 | Discharge: 2011-09-17 | Disposition: A | Discharge status: Home / Self Care | Payer: 59 | Source: Ambulatory Visit | Attending: Orthopedic Surgery | Admitting: Orthopedic Surgery

## 2011-09-17 DIAGNOSIS — M6281 Muscle weakness (generalized): Secondary | ICD-10-CM | POA: Insufficient documentation

## 2011-09-17 DIAGNOSIS — IMO0001 Reserved for inherently not codable concepts without codable children: Secondary | ICD-10-CM | POA: Insufficient documentation

## 2011-09-17 DIAGNOSIS — R262 Difficulty in walking, not elsewhere classified: Secondary | ICD-10-CM | POA: Insufficient documentation

## 2011-09-17 DIAGNOSIS — R29898 Other symptoms and signs involving the musculoskeletal system: Secondary | ICD-10-CM

## 2011-09-17 DIAGNOSIS — M545 Low back pain, unspecified: Secondary | ICD-10-CM | POA: Insufficient documentation

## 2011-09-17 NOTE — Progress Notes (Signed)
Physical Therapy Evaluation  Patient Details  Name: Melissa Kirby MRN: [id number] Date of Birth: 11-23-1960  Today's Date: 09/17/2011 Time: 3086-5784 Time Calculation (min): 43 min Charges: 1 eval, 15' Manual Visit#: 1  of 8   Re-eval: 10/17/11 Assessment Diagnosis: TLIF to L4-5 Surgical Date: 07/09/11 Next MD Visit: 1 month Prior Therapy: Before surgery for back pain  Past Medical History:  Past Medical History  Diagnosis Date  . VITAMIN B12 DEFICIENCY 09/2009 dx  . Unspecified hypothyroidism 1982/2000    surgical resection of benign growth - partial then complete  . GERD (gastroesophageal reflux disease)     per endo. - does not take any meds.    Marland Kitchen MIGRAINE HEADACHE     last Migraine 06/29/2011-   . ANEMIA, IRON DEFICIENCY   . Kidney stones 1998, 2005    x 2  . Depression     therapy  . Neuromuscular disorder     L eft  tingling  . Fibromyalgia 1999    no meds   Past Surgical History:  Past Surgical History  Procedure Date  . Total thyroidectomy 2000    Had thyroid nodule, nonmalignant ([id number]) resection  . Cholecystectomy 2009  . Tubal ligation 1989  . Diagnostic laparoscopy 1982    diagnostic  . Thyroid surgery 1983    partial   . Posterior fusion lumbar spine 06/2011    Subjective Symptoms/Limitations Symptoms: Pt is referred to PT for LBP s/p TLIF on 07/09/11.  She reports she was hospitalized until 07/14/11 for increased blood loss during the surgery.  She states that for the past two months she has been recovering and has attempted to go back to her 12 hour job which requires standing all day, however is unable to continue because of the severe pain above and below her surgical site.  She describes her pain as a "hurting, dull and achy to her muscles and joints" and it constantly hurts.  She reports increased constipation since the surgery, but denies any other changes in B&B.  She reports she has not attempted any of her HEP secondary to the MD  restrictions of NO Bending, Arching and Twisting.  How long can you sit comfortably?: Pain with sitting after 5 minutes How long can you stand comfortably?: She reports that she has difficulty standing at her job all day.  How long can you walk comfortably?: She is able to walk on her treadmill 3x/day for 10 minutes at a time.  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  She attempted to go back, but is unable to continue with work because of her pain.  Leisure: Hobbies-yes (Comment) Comments: She enjoys walking for exercise and hiking, hunting, crafting (sewing)  Cognition/Observation Observation/Other Assessments Observations: Decreased flexibility throught her Bilateral: quadricepts, hamstrings, piriformis and hip flexors.  Other Assessments: Requires mod A for approrpriate abdominal activiation  Sensation/Coordination/Flexibility/Functional Tests Functional Tests Functional Tests: Oswestery Disability Index (ODI) 64%  Assessment RLE Strength RLE Overall Strength Comments: WNL throughout  LLE Assessment LLE Assessment: Within Functional Limits Palpation Palpation: Significant spasms throughout her bilateral gluteal and lumbosacral region, hip flexor region  Mobility/Balance  Ambulation/Gait Ambulation/Gait: Yes Gait Pattern: Step-through pattern;Decreased trunk rotation (Decreased pelvic rotation-Left)   Exercise/Treatments Stretches Passive Hamstring Stretch: 1 rep;20 seconds Lower Trunk Rotation: Limitations  Lower Trunk Rotation Limitations: Educated for HEP Piriformis Stretch: 3 reps;30 seconds;Limitations Piriformis Stretch Limitations: BLE, Supine (Knee to opp shoulder) Stability Bridge: 5 reps Ab Set: 5  reps;5 seconds  Manual Therapy Manual Therapy: Other (comment) Other Manual Therapy: SCS to bilateral gluteal region x7 locations with 75% release throughout with STM after each release. Pt reports pain 2/10 after treatment.   Physical Therapy Assessment and Plan PT Assessment and Plan Clinical Impression Statement: Pt is a 51 year old female referred to PT s/p L4-5 TLIF with residual musculoskeletal gluteal and upper lumbar back pain.  After examiniation it was found that she has current impairments including increased pain, significant fascial gluteal and lumbosacral restrictions, decreased LE flexibility, impaired gait mechanics and impaired percieved functional ability which are limiting her from returning to work and participating in other community and household activities.  Pt will benefit from skilled OPPT in order to address the above impairments in order to maximize independence and reach functional goals.  Rehab Potential: Good PT Frequency: Min 2X/week PT Duration: 4 weeks PT Treatment/Interventions: Gait training;Therapeutic activities;Therapeutic exercise;Patient/family education;Other (comment) (MANUAL AND MODALITIES for pain) PT Plan: Cont with gentle SCS technique first, stretching, then add gentle strengthening as tolerated.  Allow spasms to decrease before adding dynamic stability training, can continue with general stability.     Goals Home Exercise Program Pt will Perform Home Exercise Program: Independently PT Short Term Goals Time to Complete Short Term Goals: 2 weeks PT Short Term Goal 1: Pt will report pain less than or equal to 3/10 for 50% of her day.  PT Short Term Goal 2: Pt will present with decreased fascial restrctions throughout her gluteal and hip flexor region. PT Short Term Goal 3: Pt will improve her overall LE flexibility. PT Long Term Goals Time to Complete Long Term Goals: 4 weeks PT Long Term Goal 1: Pt will report pain less than 3/10 for 75% of her  day in order to improve her QOL PT Long Term Goal 2: Pt will improve her core strength to WNL in order to tolerate standing for 2 hours to return to work. Long Term Goal 3: Pt will improve her ODI score to less than or equal to 40% for improved percieved functional ability and decreased disability Long Term Goal 4: Pt will improve her LE flexibility in order to ambulate with appropriate gait mechanics.   Problem List Patient Active Problem List  Diagnoses  . Unspecified hypothyroidism  . VITAMIN B12 DEFICIENCY  . ANEMIA, IRON DEFICIENCY  . ANEMIA-NOS  . CIRCADIAN RHYTHM SLEEP DISORDER SHIFT WORK TYPE  . MIGRAINE HEADACHE  . GASTRITIS  . GI BLEEDING  . LOSS OF APPETITE  . DYSURIA  . Atrophic vaginitis  . Depression  . Fatigue  . Left ankle pain  . Left leg weakness  . Lumbago    PT - End of Session Activity Tolerance: Patient tolerated treatment well PT Plan of Care PT Home Exercise Plan: Educated patient on continuing with piriformis stretch, hamstring stretching, bridging, LTR (unable to scan secondary to malfunction in printer today) Consulted and Agree with Plan of Care: Patient   Zoey Gilkeson 09/17/2011, 9:59 AM  Physician Documentation Your signature is required to indicate approval of the treatment plan as stated above.  Please sign and either send electronically or make a copy of this report for your files and return this physician signed original.   Please mark one 1.__approve of plan  2. ___approve of plan with the following conditions.  ______________________________                                                          _____________________ Physician Signature                                                                                                             Date

## 2011-09-19 ENCOUNTER — Ambulatory Visit (HOSPITAL_COMMUNITY)
Admission: RE | Admit: 2011-09-19 | Discharge: 2011-09-19 | Disposition: A | Discharge status: Home / Self Care | Payer: 59 | Source: Ambulatory Visit | Attending: Internal Medicine | Admitting: Internal Medicine

## 2011-09-19 DIAGNOSIS — M545 Low back pain, unspecified: Secondary | ICD-10-CM | POA: Insufficient documentation

## 2011-09-19 DIAGNOSIS — M6281 Muscle weakness (generalized): Secondary | ICD-10-CM | POA: Insufficient documentation

## 2011-09-19 DIAGNOSIS — IMO0001 Reserved for inherently not codable concepts without codable children: Secondary | ICD-10-CM | POA: Insufficient documentation

## 2011-09-19 DIAGNOSIS — R29898 Other symptoms and signs involving the musculoskeletal system: Secondary | ICD-10-CM

## 2011-09-19 DIAGNOSIS — M79609 Pain in unspecified limb: Secondary | ICD-10-CM | POA: Insufficient documentation

## 2011-09-19 NOTE — Progress Notes (Signed)
Physical Therapy Treatment Patient Details  Name: Melissa Kirby MRN: [id number] Date of Birth: 1961/01/31  Today's Date: 09/19/2011 Time: 6578-4696 Time Calculation (min): 55 min Manual: 25', TE: 15', Heat 1 unit Visit#: 2  of 8   Re-eval: 10/17/11    Subjective: Symptoms/Limitations Symptoms: I am not really sore, I am just so stiff today.  Pain Assessment Currently in Pain?: Yes Pain Score:   2 Pain Location: Back  Exercise/Treatments  Stretches Single Knee to Chest Stretch: 3 reps;30 seconds;Limitations Single Knee to Chest Stretch Limitations: BLE Quad Stretch: 3 reps;30 seconds;Limitations Quad Stretch Limitations: BLE Piriformis Stretch: 3 reps;30 seconds Piriformis Stretch Limitations: BLE supine in figure 4 position Lumbar Exercises   Stability Bridge: 10 reps;5 seconds  Modalities Modalities: Moist Heat Manual Therapy Manual Therapy: Other (comment) Other Manual Therapy: SCSto bilateral gluteal region x10 locations w/STM and CTR afterwards x25 min.  Moist Heat Therapy Number Minutes Moist Heat: 10 Minutes Moist Heat Location: Other (comment) (Back after treatment)  Physical Therapy Assessment and Plan PT Assessment and Plan Clinical Impression Statement: Pt has significant decrease in overall soreness and stiffness after treatment today.   PT Plan: Add core stabilization as tolerated.  Continue to decrease spasms first, stretch then exercises (LTR, ab set, bent knee raise etc), heat after to improve overall circulation to lumbosacral region    Problem List Patient Active Problem List  Diagnoses  . Unspecified hypothyroidism  . VITAMIN B12 DEFICIENCY  . ANEMIA, IRON DEFICIENCY  . ANEMIA-NOS  . CIRCADIAN RHYTHM SLEEP DISORDER SHIFT WORK TYPE  . MIGRAINE HEADACHE  . GASTRITIS  . GI BLEEDING  . LOSS OF APPETITE  . DYSURIA  . Atrophic vaginitis  . Depression  . Fatigue  . Left ankle pain  . Left leg weakness  . Lumbago    Melissa Kirby 09/19/2011,  12:16 PM

## 2011-09-23 ENCOUNTER — Ambulatory Visit (HOSPITAL_COMMUNITY)
Admission: RE | Admit: 2011-09-23 | Discharge: 2011-09-23 | Disposition: A | Discharge status: Home / Self Care | Payer: 59 | Source: Ambulatory Visit | Attending: Internal Medicine | Admitting: Internal Medicine

## 2011-09-23 DIAGNOSIS — R29898 Other symptoms and signs involving the musculoskeletal system: Secondary | ICD-10-CM

## 2011-09-23 NOTE — Progress Notes (Signed)
Physical Therapy Treatment Patient Details  Name: NIKOLE SWARTZENTRUBER MRN: [id number] Date of Birth: Jul 25, 1960  Today's Date: 09/23/2011 Time: 4098-1191 Time Calculation (min): 63 min Visit#: 3  of 8   Re-eval: 10/17/11  Charge: STM 23 min therex 25 min MHP 15 min/ 1 unit  Subjective: Symptoms/Limitations Symptoms: Really stiff today, pain scale 3/10 LBP R>L and R scapular region. Pain Assessment Currently in Pain?: Yes Pain Score:   3 Pain Location: Back Pain Orientation: Lower;Right  Objective:   Exercise/Treatments Stretches Single Knee to Chest Stretch: 3 reps;30 seconds;Limitations Single Knee to Chest Stretch Limitations: BLE Lower Trunk Rotation: 5 reps;10 seconds Quadruped Mid Back Stretch: 1 rep;30 seconds;2 reps;10 seconds;Limitations Quadruped Mid Back Stretch Limitations: child's pose x 1 rep, mad cat/camel 2x 10" holds Piriformis Stretch: 3 reps;30 seconds Piriformis Stretch Limitations: BLE supine in figure 4 position Stability Bridge: 10 reps;5 seconds Bent Knee Raise: 5 reps;5 seconds;Limitations Bent Knee Raise Limitations: with ab sets Ab Set: 10 reps;5 seconds  Manual Therapy Manual Therapy: Massage Other Manual Therapy: SCS to bilateral gluteal region and R thoracic paraspinal region x 23 min Moist Heat Therapy Number Minutes Moist Heat: 15 Minutes Moist Heat Location: Other (comment) (back supine at end of session.)  Physical Therapy Assessment and Plan PT Assessment and Plan Clinical Impression Statement: Pt with significant spasms R thoracic paraspinal and R>L gluteal regional.  Able to decrease overall stiffness and pain following STM and stretches with heat at end of session.  Add bent knee raise for core stability with therapist hand under back for cueing. PT Plan: Continue with current POC, continue to decrease spasms first, stretch then exercises with heat at end of session to improve overall circulation to lumbosacral region.  Add clam for core  stability next session if time allows.    Goals    Problem List Patient Active Problem List  Diagnoses  . Unspecified hypothyroidism  . VITAMIN B12 DEFICIENCY  . ANEMIA, IRON DEFICIENCY  . ANEMIA-NOS  . CIRCADIAN RHYTHM SLEEP DISORDER SHIFT WORK TYPE  . MIGRAINE HEADACHE  . GASTRITIS  . GI BLEEDING  . LOSS OF APPETITE  . DYSURIA  . Atrophic vaginitis  . Depression  . Fatigue  . Left ankle pain  . Left leg weakness  . Lumbago    PT - End of Session Activity Tolerance: Patient tolerated treatment well General Behavior During Session: [facility] for tasks performed Cognition: [facility] for tasks performed  GP No functional reporting required  Juel Burrow 09/23/2011, 10:06 AM

## 2011-09-25 ENCOUNTER — Ambulatory Visit (HOSPITAL_COMMUNITY)
Admission: RE | Admit: 2011-09-25 | Discharge: 2011-09-25 | Disposition: A | Discharge status: Home / Self Care | Payer: 59 | Source: Ambulatory Visit | Attending: Internal Medicine | Admitting: Internal Medicine

## 2011-09-25 DIAGNOSIS — R29898 Other symptoms and signs involving the musculoskeletal system: Secondary | ICD-10-CM

## 2011-09-25 NOTE — Progress Notes (Signed)
Physical Therapy Treatment Patient Details  Name: Melissa Kirby MRN: [id number] Date of Birth: October 18, 1960  Today's Date: 09/25/2011 Time: 4098-1191 Time Calculation (min): 45 min Charges: 30' TE, 15' manual  Visit#: 4  of 8   Re-eval: 10/17/11    Subjective: Symptoms/Limitations Symptoms: I was really sore after the last session during that night, but the next day I was doing a little better.  I continue to have most of my pain on the R side.  Pain Assessment Pain Score:   3 Pain Location: Back  Exercise/Treatments Stretches Quadruped Mid Back Stretch: 3 reps;60 seconds Stability Bent Knee Raise: 5 reps;5 seconds;Limitations Bent Knee Raise Limitations: w/ab set and BLE Ab Set: 5 reps;5 seconds Isometric Hip Flexion: Limitations Isometric Hip Flexion Limitations: Leg flexion to extension 5x eac w/ab set Opposite Arm/Leg Raise: Right arm/Left leg;Left arm/Right leg;10 reps Machine Exercises Tread Mill: 6' warm up, 3' after treatment  Manual Therapy Manual Therapy: Other (comment) Other Manual Therapy: SCS to bilateral TFL's and R gluteal region w/STM after. x15 minutes   Physical Therapy Assessment and Plan PT Assessment and Plan Clinical Impression Statement: Reduction in QL and TFL spams with manual techniques today, however continues to complain of facial pain with stretching exercises and after TM at end of treatment.  Added TM walking before and after session today for improved blood flow.  PT Plan: f/u with today's treatment start with TM if appropriate and cont to decrease spasms and improve flexibility and core strength. Add clams next visit    Problem List Patient Active Problem List  Diagnoses  . Unspecified hypothyroidism  . VITAMIN B12 DEFICIENCY  . ANEMIA, IRON DEFICIENCY  . ANEMIA-NOS  . CIRCADIAN RHYTHM SLEEP DISORDER SHIFT WORK TYPE  . MIGRAINE HEADACHE  . GASTRITIS  . GI BLEEDING  . LOSS OF APPETITE  . DYSURIA  . Atrophic vaginitis  . Depression  .  Fatigue  . Left ankle pain  . Left leg weakness  . Lumbago       GP No functional reporting required  Ayanna Gheen 09/25/2011, 9:38 AM

## 2011-09-30 ENCOUNTER — Ambulatory Visit (HOSPITAL_COMMUNITY)
Admission: RE | Admit: 2011-09-30 | Discharge: 2011-09-30 | Disposition: A | Discharge status: Home / Self Care | Payer: 59 | Source: Ambulatory Visit | Attending: Internal Medicine | Admitting: Internal Medicine

## 2011-09-30 DIAGNOSIS — R29898 Other symptoms and signs involving the musculoskeletal system: Secondary | ICD-10-CM

## 2011-09-30 NOTE — Progress Notes (Signed)
Physical Therapy Treatment Patient Details  Name: Melissa Kirby MRN: [id number] Date of Birth: 11-Nov-1960  Today's Date: 09/30/2011 Time: 8295-6213 Time Calculation (min): 47 min Visit#: 5  of 8   Re-eval: 10/17/11  Charge: therex 36 min STM 10 min  Subjective: Symptoms/Limitations Symptoms: I dont feel any better, the lower back is so tight pain scale 5/10.  I have been walking 3 times a day for 20 minutes but as soon as I sit down it comes right back. Pain Assessment Currently in Pain?: Yes Pain Score:   5 Pain Location: Back Pain Orientation: Right;Lower  Objective:   Exercise/Treatments Stretches Passive Hamstring Stretch: 3 reps;30 seconds Double Knee to Chest Stretch: 1 rep;30 seconds Hip Flexor Stretch: 3 reps;30 seconds;Limitations Hip Flexor Stretch Limitations: supine with manual stretches Piriformis Stretch: 3 reps;30 seconds Piriformis Stretch Limitations: BLE supine in figure 4 position Stability Clam: Supine;10 reps Opposite Arm/Leg Raise: Right arm/Left leg;Left arm/Right leg;10 reps Machine Exercises Tread Mill: 8' warm up @ 2.6 mph at beginning of session; 4' @ 3.0 at end of treatment  Manual Therapy Other Manual Therapy: SCS to bilateral TFL's and R gluteal region w/STM after. x10 minutes  Physical Therapy Assessment and Plan PT Assessment and Plan Clinical Impression Statement: Added clam for stability per PT plan with min cueing to initiate ab set for control.  Able to reduce R gluteal region and B TFL/QL spasms R>L with manual techniques with pain reduced to 3/10 at end of session.   PT Plan: Continue with current POC to reduce spasms, improve flexibilty and increase core strength.    Goals    Problem List Patient Active Problem List  Diagnoses  . Unspecified hypothyroidism  . VITAMIN B12 DEFICIENCY  . ANEMIA, IRON DEFICIENCY  . ANEMIA-NOS  . CIRCADIAN RHYTHM SLEEP DISORDER SHIFT WORK TYPE  . MIGRAINE HEADACHE  . GASTRITIS  . GI BLEEDING    . LOSS OF APPETITE  . DYSURIA  . Atrophic vaginitis  . Depression  . Fatigue  . Left ankle pain  . Left leg weakness  . Lumbago    PT - End of Session Activity Tolerance: Patient tolerated treatment well General Behavior During Session: [name] for tasks performed Cognition: [name] for tasks performed  Juel Burrow, PTA 09/30/2011, 12:14 PM

## 2011-10-02 ENCOUNTER — Ambulatory Visit (HOSPITAL_COMMUNITY): Payer: 59 | Admitting: Physical Therapy

## 2011-10-02 ENCOUNTER — Ambulatory Visit (INDEPENDENT_AMBULATORY_CARE_PROVIDER_SITE_OTHER): Payer: 59 | Admitting: Licensed Clinical Social Worker

## 2011-10-02 DIAGNOSIS — F4322 Adjustment disorder with anxiety: Secondary | ICD-10-CM

## 2011-10-03 ENCOUNTER — Ambulatory Visit (HOSPITAL_COMMUNITY)
Admission: RE | Admit: 2011-10-03 | Discharge: 2011-10-03 | Disposition: A | Discharge status: Home / Self Care | Payer: 59 | Source: Ambulatory Visit

## 2011-10-03 DIAGNOSIS — R29898 Other symptoms and signs involving the musculoskeletal system: Secondary | ICD-10-CM

## 2011-10-03 NOTE — Progress Notes (Signed)
Physical Therapy Treatment Patient Details  Name: Melissa Kirby MRN: [id number] Date of Birth: [birth year]  Today's Date: 10/03/2011 Time: 1108-1200 Time Calculation (min): 52 min Visit#: 6  of 8   Re-eval: 10/17/11  Charge: NMR 8 min Manual 38 min therex 6 min  Subjective: Pt stated she can only sit for 10 minutes and R LE tingling but resolved with standing/walking, pain scale 5/10  Objective:   Exercise/Treatments Stretches Passive Hamstring Stretch: 3 reps;30 seconds Single Knee to Chest Stretch: 3 reps;30 seconds;Limitations Single Knee to Chest Stretch Limitations: BLE Quadruped Mid Back Stretch: 3 reps;60 seconds Quadruped Mid Back Stretch Limitations: child's pose Stability Ab Set: Limitations;10 reps (sidelying TA with manual cueing/ vc for kegal exercises)  NMR Pelvic Floor Contractions:unable to maintain greater than 4 sec on R side, unable to maintain greater than 6 sec on L side.   Manual Therapy Manual Therapy: Massage Massage: STM to B lower back with increased tenderness S1 L. x 18 min Other Manual Therapy: Nerve gliding/ passive stretches x 20 min  Physical Therapy Assessment and Plan PT Assessment and Plan Clinical Impression Statement: Nerve gliding complete with L LE tingling eliminated, increase ROM and pain resolved.  Pt able to sit for 5 minutes following treatment with no c/o tingling.  Pt educated on the role and improtance of spinal stabalizers and the goal of therapy to stabalize the spine to decrease pain as well as stabalize . PT Plan: Assess pain relief following session.  Continue to reduce spasms, improve flexibility and increase core strength.    Goals    Problem List Patient Active Problem List  Diagnoses  . Unspecified hypothyroidism  . VITAMIN B12 DEFICIENCY  . ANEMIA, IRON DEFICIENCY  . ANEMIA-NOS  . CIRCADIAN RHYTHM SLEEP DISORDER SHIFT WORK TYPE  . MIGRAINE HEADACHE  . GASTRITIS  . GI BLEEDING  . LOSS OF APPETITE  . DYSURIA    . Atrophic vaginitis  . Depression  . Fatigue  . Left ankle pain  . Left leg weakness  . Lumbago    PT - End of Session Activity Tolerance: Patient tolerated treatment well General Behavior During Session: [facility] for tasks performed Cognition: [facility] for tasks performed  Juel Burrow, PTA 10/03/2011, 6:19 PM

## 2011-10-06 ENCOUNTER — Ambulatory Visit (HOSPITAL_COMMUNITY)
Admission: RE | Admit: 2011-10-06 | Discharge: 2011-10-06 | Disposition: A | Discharge status: Home / Self Care | Payer: 59 | Source: Ambulatory Visit | Attending: Internal Medicine | Admitting: Internal Medicine

## 2011-10-06 DIAGNOSIS — R29898 Other symptoms and signs involving the musculoskeletal system: Secondary | ICD-10-CM

## 2011-10-06 NOTE — Progress Notes (Signed)
Physical Therapy Treatment Patient Details  Name: MCKINZI ERIKSEN MRN: [id number] Date of Birth: 1960-11-18  Today's Date: 10/06/2011 Time: 4098-1191 Time Calculation (min): 50 min Visit#: 7  of 8   Re-eval: 10/17/11  Charge: therex 14 min Manual 30 min  Subjective: Symptoms/Limitations Symptoms: Pt stated the tingling is still there when sitting but not as intense since last session, pain scale 6/10 pt feels as though the weather is related to increased pain/tightness Pain Assessment Currently in Pain?: Yes Pain Score:   6 Pain Location: Back Pain Orientation: Mid;Lower  Objective:   Exercise/Treatments Stretches Passive Hamstring Stretch: 3 reps;30 seconds Single Knee to Chest Stretch: 3 reps;30 seconds;Limitations Single Knee to Chest Stretch Limitations: BLE Quadruped Mid Back Stretch: 3 reps;60 seconds Quadruped Mid Back Stretch Limitations: child's pose Piriformis Stretch: 3 reps;30 seconds Piriformis Stretch Limitations: BLE supine in figure 4 position Stability Bridge: 15 reps Ab Set: Limitations;10 reps AB Set Limitations: TA with manual cues for correct musculature contraction Machine Exercises Tread Mill: 6' warm up @ 2.5 at beginning of session  Manual Therapy Massage: STM to lower back R>L x 15 min Other Manual Therapy: Nerve gliding x 15 min  Physical Therapy Assessment and Plan PT Assessment and Plan Clinical Impression Statement: Treatment focus on flexibiility and stability, pt able to complete all stretches with good form with no cueing required.  Very tight paraspinals especially lumbar region.  Pt stated pain reduced to 3/10 followng manual nerve gliding and prone STM. PT Plan: Reassess next session, continue to reduce spasms, increase core strength and improve flexibility.    Goals    Problem List Patient Active Problem List  Diagnoses  . Unspecified hypothyroidism  . VITAMIN B12 DEFICIENCY  . ANEMIA, IRON DEFICIENCY  . ANEMIA-NOS  .  CIRCADIAN RHYTHM SLEEP DISORDER SHIFT WORK TYPE  . MIGRAINE HEADACHE  . GASTRITIS  . GI BLEEDING  . LOSS OF APPETITE  . DYSURIA  . Atrophic vaginitis  . Depression  . Fatigue  . Left ankle pain  . Left leg weakness  . Lumbago    PT - End of Session Activity Tolerance: Patient tolerated treatment well General Behavior During Session: [facility] for tasks performed Cognition: [facility] for tasks performed  Juel Burrow, PTA 10/06/2011, 1:13 PM

## 2011-10-09 ENCOUNTER — Ambulatory Visit (HOSPITAL_COMMUNITY)
Admission: RE | Admit: 2011-10-09 | Discharge: 2011-10-09 | Disposition: A | Discharge status: Home / Self Care | Payer: 59 | Source: Ambulatory Visit | Attending: Internal Medicine | Admitting: Internal Medicine

## 2011-10-09 DIAGNOSIS — R29898 Other symptoms and signs involving the musculoskeletal system: Secondary | ICD-10-CM

## 2011-10-09 NOTE — Evaluation (Signed)
Physical Therapy Re-Evaluation  Patient Details  Name: Melissa Kirby MRN: [id number] Date of Birth: 1961/02/15  Today's Date: 10/09/2011 Time: 4098-1191 Time Calculation (min): 45 min  Visit#: 8  of 8   Re-eval: 11/08/11 Assessment Next MD Visit: 10/13/2011 Charge: therex 38 min MMT 1 unit  Subjective Symptoms/Limitations Symptoms: Pt entered dept ambulating forward trunk flexed, stated back pain fine last night but woke up with morning with increased pain and tightness, pt did walk a mile before this session but stated walking did not help with pain today.  Pain scale 5/10 R side LBP.  Pt reported no tingling today when sitting. How long can you sit comfortably?: 5 minutes with tingling down R LE sitting greater than 5-10 minutes How long can you stand comfortably?: Can stand for 10 minutes with pain increase following How long can you walk comfortably?: unlimited, pt walks on treadmill 3x a day Pain Assessment Currently in Pain?: Yes Pain Score:   5 Pain Location: Back Pain Orientation: Right;Lower  Objective:   Sensation/Coordination/Flexibility/Functional Tests Functional Tests Functional Tests: [place] Disability Index (ODI) 68%  Assessment LLE Strength Left Hip Flexion: 5/5 Left Hip Extension: 5/5 Left Hip ABduction: 5/5 Left Hip ADduction: 4/5 Left Knee Flexion: 5/5 (4+/5) Left Knee Extension: 5/5  Exercise/Treatments Stretches Passive Hamstring Stretch: 3 reps;30 seconds Quadruped Mid Back Stretch: 3 reps;60 seconds Quadruped Mid Back Stretch Limitations: child's pose Piriformis Stretch: 3 reps;30 seconds Piriformis Stretch Limitations: BLE supine in figure 4 position Stability Bent Knee Raise: 5 reps;Limitations Bent Knee Raise Limitations: with TA contractions Ab Set: Limitations;10 reps AB Set Limitations: TA 10x 10" supine Straight Leg Raise: Limitations Straight Leg Raises Limitations: 5 heel slides 10" holds, 5 Heel slides with UE 10" holds BLE     Physical Therapy Assessment and Plan PT Assessment and Plan Clinical Impression Statement: Reassessment complete for Mrs. Hardie Pulley.  She has had 8 OPPT sessions overs 4 weeks and has met 2/3 STG and 0/4 LTG.  Pt had decreased pain after treatment today.  Continues to have increased pain when she is at home.  Able to independently demonstrate appropriate core control without subsitiutions.   PT Frequency: Min 2X/week (per PT (L.M) recommendation) PT Duration: 4 weeks (per PT (L.M.) recommendation) PT Plan: Cont per POC.  f/u with advanced core exercises    Goals Home Exercise Program Pt will Perform Home Exercise Program: Independently PT Goal: Perform Home Exercise Program - Progress: Met PT Short Term Goals Time to Complete Short Term Goals: 2 weeks PT Short Term Goal 1: Pt will report pain less than or equal to 3/10 for 50% of her day.  PT Short Term Goal 1 - Progress: Partly met (not today but other days pain less than 3/10 50% of day) PT Short Term Goal 2: Pt will present with decreased fascial restrctions throughout her gluteal and hip flexor region. PT Short Term Goal 2 - Progress: Met PT Short Term Goal 3: Pt will improve her overall LE flexibility. PT Short Term Goal 3 - Progress: Met PT Long Term Goals Time to Complete Long Term Goals: 4 weeks PT Long Term Goal 1: Pt will report pain less than 3/10 for 75% of her day in order to improve her QOL PT Long Term Goal 1 - Progress: Not met PT Long Term Goal 2: Pt will improve her core strength to WNL in order to tolerate standing for 2 hours to return to work. PT Long Term Goal 2 - Progress: Not met Long Term  Goal 3: Pt will improve her ODI score to less than or equal to 40% for improved percieved functional ability and decreased disability Long Term Goal 3 Progress: Not met Long Term Goal 4: Pt will improve her LE flexibility in order to ambulate with appropriate gait mechanics.  Long Term Goal 4 Progress: Progressing toward  goal  Problem List Patient Active Problem List  Diagnoses  . Unspecified hypothyroidism  . VITAMIN B12 DEFICIENCY  . ANEMIA, IRON DEFICIENCY  . ANEMIA-NOS  . CIRCADIAN RHYTHM SLEEP DISORDER SHIFT WORK TYPE  . MIGRAINE HEADACHE  . GASTRITIS  . GI BLEEDING  . LOSS OF APPETITE  . DYSURIA  . Atrophic vaginitis  . Depression  . Fatigue  . Left ankle pain  . Left leg weakness  . Lumbago   Juel Burrow, PTA 10/09/2011, 2:56 PM

## 2011-10-15 ENCOUNTER — Ambulatory Visit (HOSPITAL_COMMUNITY)
Admission: RE | Admit: 2011-10-15 | Discharge: 2011-10-15 | Disposition: A | Discharge status: Home / Self Care | Payer: 59 | Source: Ambulatory Visit | Attending: Internal Medicine | Admitting: Internal Medicine

## 2011-10-15 DIAGNOSIS — R29898 Other symptoms and signs involving the musculoskeletal system: Secondary | ICD-10-CM

## 2011-10-15 NOTE — Progress Notes (Signed)
Physical Therapy Treatment Patient Details  Name: Melissa Kirby MRN: [id number] Date of Birth: [birth year]  Today's Date: 10/15/2011 Time: 0804-0900 Time Calculation (min): 56 min Visit#: 9  of 16   Re-eval: 11/08/11  Charge: therex 24 min Manual 12 min Self care 12 min  Subjective: Symptoms/Limitations Symptoms: Pt entered dept with order for TENS unit, stated when she went to the physician she found reason for the tingling down R LE secondary to a spur on S1.  Pain scale 3/10 pre-medicated, been up since 4 this morning secondary to pain; Pain Assessment Currently in Pain?: Yes Pain Score:   3 Pain Location: Back Pain Orientation: Right;Lower  Objective:   Exercise/Treatments Stability Clam: Supine;10 reps;Limitations Clam Limitations: 10 sec hold with TrA contraction Bridge: 15 reps Bent Knee Raise: 10 reps;Limitations Bent Knee Raise Limitations: with TA contractions Ab Set: Limitations;10 reps AB Set Limitations: TA 10x 10" supine Straight Leg Raise: 10 reps;Limitations Straight Leg Raises Limitations: 10 heel slides 10" holds Machine Exercises Tread Mill: 8' warm up @ 2.5 at beginning of session  Manual Therapy Other Manual Therapy: Nerve gliding  Physical Therapy Assessment and Plan PT Assessment and Plan Clinical Impression Statement: Pt instructed use of TENS unit per MD order, pt able to independently put on and take off safely.  Advanced core stability exercises with fatigue noted but able to demonstrate appropriate core control without substitutions.  Pt stated pain reduced following manual nerve glides.   PT Plan: Assess pain with TENS unit and continue progressing core strength and stabilty.    Goals    Problem List Patient Active Problem List  Diagnoses  . Unspecified hypothyroidism  . VITAMIN B12 DEFICIENCY  . ANEMIA, IRON DEFICIENCY  . ANEMIA-NOS  . CIRCADIAN RHYTHM SLEEP DISORDER SHIFT WORK TYPE  . MIGRAINE HEADACHE  . GASTRITIS  . GI BLEEDING    . LOSS OF APPETITE  . DYSURIA  . Atrophic vaginitis  . Depression  . Fatigue  . Left ankle pain  . Left leg weakness  . Lumbago    PT - End of Session Activity Tolerance: Patient tolerated treatment well General Behavior During Session: [facility] for tasks performed Cognition: [facility] for tasks performed  GP No functional reporting required  Juel Burrow, PTA 10/15/2011, 9:13 AM

## 2011-10-17 ENCOUNTER — Ambulatory Visit (HOSPITAL_COMMUNITY)
Admission: RE | Admit: 2011-10-17 | Discharge: 2011-10-17 | Disposition: A | Discharge status: Home / Self Care | Payer: 59 | Source: Ambulatory Visit | Attending: Internal Medicine | Admitting: Internal Medicine

## 2011-10-17 DIAGNOSIS — R29898 Other symptoms and signs involving the musculoskeletal system: Secondary | ICD-10-CM

## 2011-10-17 NOTE — Progress Notes (Signed)
Physical Therapy Treatment Patient Details  Name: Melissa Kirby MRN: [id number] Date of Birth: 12/15/1960  Today's Date: 10/17/2011 Time: 0802-0845 Time Calculation (min): 43 min Visit#: 10  of 16   Re-eval: 11/08/11  Charge:  therex 35 min  Subjective: Symptoms/Limitations Symptoms: Pt entered dept feeling a lot better, stated she has been using the TENS unit following exercise and when going to be riding in vehicles for long periods of time and MD gave her new perscription for a muscle relaxer, pt belives this combination is really helping.  Pt stated slight discomfort over R hip pain scale 3/10. Pain Assessment Currently in Pain?: Yes Pain Score:   3 Pain Location: Hip Pain Orientation: Right  Objective:   Exercise/Treatments Stretches Active Hamstring Stretch: 3 reps;30 seconds Quadruped Mid Back Stretch: Limitations Quadruped Mid Back Stretch Limitations: HEP Piriformis Stretch: 3 reps;30 seconds Piriformis Stretch Limitations: BLE supine in figure 4 position Stability Clam: Limitations Clam Limitations: standing 10x 5" Dead Bug: 10 reps;Limitations Dead Bug Limitations: sitting on green ball  Bridge: 5 reps;Limitations Bridge Limitations: 5 reps bridge, c/o tightness no pain; h/s curl 10 reps  Bent Knee Raise: Limitations;10 reps Bent Knee Raise Limitations: sitting on green ball Ab Set: Limitations;10 reps AB Set Limitations: sitting on green tband TrA 10 reps 10" Large Ball Abdominal Isometric: Supine;10 reps Large Ball Oblique Isometric: Supine;5 reps Machine Exercises Tread Mill: 8' warm up @ 2.5 at beginning of session  Physical Therapy Assessment and Plan PT Assessment and Plan Clinical Impression Statement: Instructed therapeutic ball activities for core strengthening, stability and mobility, pt stated she had a ball in the past and is interested in getting another one.  Pt able to sit for >20 minutes with no c/o tingling and no pain with any activity.  Good  core stability noted PT Plan: Continue progressing core strength and stability, assess pain following new activity.    Goals    Problem List Patient Active Problem List  Diagnoses  . Unspecified hypothyroidism  . VITAMIN B12 DEFICIENCY  . ANEMIA, IRON DEFICIENCY  . ANEMIA-NOS  . CIRCADIAN RHYTHM SLEEP DISORDER SHIFT WORK TYPE  . MIGRAINE HEADACHE  . GASTRITIS  . GI BLEEDING  . LOSS OF APPETITE  . DYSURIA  . Atrophic vaginitis  . Depression  . Fatigue  . Left ankle pain  . Left leg weakness  . Lumbago    PT - End of Session Activity Tolerance: Patient tolerated treatment well General Behavior During Session: [facility] for tasks performed Cognition: [facility] for tasks performed  GP No functional reporting required  Juel Burrow, PTA 10/17/2011, 8:55 AM

## 2011-10-21 ENCOUNTER — Ambulatory Visit (HOSPITAL_COMMUNITY)
Admission: RE | Admit: 2011-10-21 | Discharge: 2011-10-21 | Disposition: A | Discharge status: Home / Self Care | Payer: 59 | Source: Ambulatory Visit | Attending: Internal Medicine | Admitting: Internal Medicine

## 2011-10-21 DIAGNOSIS — M6281 Muscle weakness (generalized): Secondary | ICD-10-CM | POA: Insufficient documentation

## 2011-10-21 DIAGNOSIS — M79609 Pain in unspecified limb: Secondary | ICD-10-CM | POA: Insufficient documentation

## 2011-10-21 DIAGNOSIS — M545 Low back pain, unspecified: Secondary | ICD-10-CM | POA: Insufficient documentation

## 2011-10-21 DIAGNOSIS — IMO0001 Reserved for inherently not codable concepts without codable children: Secondary | ICD-10-CM | POA: Insufficient documentation

## 2011-10-21 NOTE — Progress Notes (Signed)
Physical Therapy Treatment Patient Details  Name: Melissa Kirby MRN: [id number] Date of Birth: Oct 10, 1960  Today's Date: 10/21/2011 Time: 4098-1191 Time Calculation (min): 38 min Charges: 30' Manual, 8' TE Visit#: 11  of 16   Re-eval: 11/08/11    Subjective: Symptoms/Limitations Symptoms: Pt reports that she bought a ball over the weekend and spent too much time on it.  This morning she has more pain.  Pain Assessment Pain Score:   5 Pain Location: Back  Exercise/Treatments Stretches Quadruped Mid Back Stretch: 60 seconds;3 reps (after manual treatment) Machine Exercises Tread Mill: 5' warm up 2.5 at beginning of treatment  Manual Therapy Manual Therapy: Joint mobilization Joint Mobilization: Grade III-IV PA mobs to L2-T3 spinous process, R L3 Transverse process, L L1-L3 Transverse process  Myofascial Release: To R thoracic and thoracolumbar region to decrease overall spasms x20'   Physical Therapy Assessment and Plan PT Assessment and Plan Clinical Impression Statement: Pt continues to have significant anxitey and frusturation regarding her increased back pain, especially to her R erector spinae region in the thoracic and lumbar area.  She had a moderate decrease in overal thoracic spasms after manual treatment today, however continues to have signifciant spasm and fascial restriction to her R thoracolumbar region.  Her pain did not change with treatment today.  Educated patient she may want to slowly return to work in order to become active again and decrease the focus on her continued pain.  Educated patient to use a different setting with her TENS unit to help gait pain.  PT Plan: F/U w/MFR and joint mobs today.     Goals    Problem List Patient Active Problem List  Diagnoses  . Unspecified hypothyroidism  . VITAMIN B12 DEFICIENCY  . ANEMIA, IRON DEFICIENCY  . ANEMIA-NOS  . CIRCADIAN RHYTHM SLEEP DISORDER SHIFT WORK TYPE  . MIGRAINE HEADACHE  . GASTRITIS  . GI  BLEEDING  . LOSS OF APPETITE  . DYSURIA  . Atrophic vaginitis  . Depression  . Fatigue  . Left ankle pain  . Left leg weakness  . Lumbago       GP No functional reporting required  Melissa Kirby 10/21/2011, 11:50 AM

## 2011-10-23 ENCOUNTER — Ambulatory Visit (INDEPENDENT_AMBULATORY_CARE_PROVIDER_SITE_OTHER): Payer: 59 | Admitting: Licensed Clinical Social Worker

## 2011-10-23 DIAGNOSIS — F4322 Adjustment disorder with anxiety: Secondary | ICD-10-CM

## 2011-10-24 ENCOUNTER — Ambulatory Visit (HOSPITAL_COMMUNITY)
Admission: RE | Admit: 2011-10-24 | Discharge: 2011-10-24 | Disposition: A | Discharge status: Home / Self Care | Payer: 59 | Source: Ambulatory Visit | Attending: Internal Medicine | Admitting: Internal Medicine

## 2011-10-24 DIAGNOSIS — R29898 Other symptoms and signs involving the musculoskeletal system: Secondary | ICD-10-CM

## 2011-10-24 NOTE — Progress Notes (Signed)
Physical Therapy Treatment Patient Details  Name: Melissa Kirby MRN: [id number] Date of Birth: [birth year]  Today's Date: 10/24/2011 Time:  800-850 Visit#: 12  of 16   Re-eval: 11/08/11  Charge: therex 11 min Manual 29 min  Subjective: Symptoms/Limitations Symptoms: Pt stated good relief in thoracic area following MFR, requested MFR to gluteal region this session.  Pt stated she believes her Fibromyalgie is flared up has been tingling fingertips, reported she is going to call  MD and ask for change in medication/  Objective:  Exercise/Treatments Stretches Quadruped Mid Back Stretch: 60 seconds;3 reps (after manual treatment) Machine Exercises Tread Mill: 8' warm up 2.5 at beginning of treatment  Manual Therapy Myofascial Release: To R erector spinae lumbar/sacral and gluteal region to decrease overall spasms x 29 min   Physical Therapy Assessment and Plan PT Assessment and Plan Clinical Impression Statement: MFR complete to R erector spinae lumbar and sacral region and gluteal region.  Able to resolve fascial restrictions lumbar region with relief stated by pt but unable to resolve restrictions sacral region.   PT Plan: Continue current POC.    Goals    Problem List Patient Active Problem List  Diagnoses  . Unspecified hypothyroidism  . VITAMIN B12 DEFICIENCY  . ANEMIA, IRON DEFICIENCY  . ANEMIA-NOS  . CIRCADIAN RHYTHM SLEEP DISORDER SHIFT WORK TYPE  . MIGRAINE HEADACHE  . GASTRITIS  . GI BLEEDING  . LOSS OF APPETITE  . DYSURIA  . Atrophic vaginitis  . Depression  . Fatigue  . Left ankle pain  . Left leg weakness  . Lumbago    PT - End of Session Activity Tolerance: Patient tolerated treatment well General Behavior During Session: [facility] for tasks performed Cognition: [facility] for tasks performed  GP No functional reporting required  Juel Burrow 10/24/2011, 5:30 PM

## 2011-10-28 ENCOUNTER — Ambulatory Visit (HOSPITAL_COMMUNITY)
Admission: RE | Admit: 2011-10-28 | Discharge: 2011-10-28 | Disposition: A | Discharge status: Home / Self Care | Payer: 59 | Source: Ambulatory Visit | Attending: Orthopedic Surgery | Admitting: Orthopedic Surgery

## 2011-10-28 DIAGNOSIS — R29898 Other symptoms and signs involving the musculoskeletal system: Secondary | ICD-10-CM

## 2011-10-28 NOTE — Progress Notes (Signed)
Physical Therapy Treatment Patient Details  Name: TZIPPY TESTERMAN MRN: [id number] Date of Birth: [birth year]  Today's Date: 10/28/2011 Time: 4098-1191 Time Calculation (min): 45 min Visit#: 13  of 16   Re-eval: 11/08/11  Charge: MHP 20 min Manual 23 min  Subjective:  Pt believes her fibromyalgia is increased possibly due to TENS unit but unsure. Pain scale 6-7/10 pt stated she is miserable all over trying to schedule apt with MD. Pt reported relief following MFR last session and sitting on ball is better than on a chair but walking is no longer a relief.   Objective:   Exercise/Treatments Stretches Quadruped Mid Back Stretch: 60 seconds;3 reps Machine Exercises Tread Mill: 2'@ 1.7 stopped early due to pain increase  Modalities Modalities: Moist Heat Manual Therapy Manual Therapy: Other (comment) Myofascial Release: To R erector spinae lumbar/sacral and gluteal region to decrease overall spasms Other Manual Therapy: Prone femoral nerve gliding; SI alignment check Moist Heat Therapy Number Minutes Moist Heat: 20 Minutes Moist Heat Location: Other (comment) (back supine following treadmill)  Physical Therapy Assessment and Plan PT Assessment and Plan Clinical Impression Statement: Stopped warm up on treadmill early this session secondary to antalgic gait and no pain relief while walking.  Pt continues to have significant anxitey and frusturation regarding her increased back pain, especially to her R erector spinae region in the lumbar, sacral and gluteal area. Pt stated no relief with MHP.  MFR complete with min relief stated following.  Pt stated she felt as though her hips were IR, completed SI alignment check no MET required.  Pt recommended to see MD. PT Plan: Continue per current POC.    Goals    Problem List Patient Active Problem List  Diagnoses  . Unspecified hypothyroidism  . VITAMIN B12 DEFICIENCY  . ANEMIA, IRON DEFICIENCY  . ANEMIA-NOS  . CIRCADIAN RHYTHM SLEEP  DISORDER SHIFT WORK TYPE  . MIGRAINE HEADACHE  . GASTRITIS  . GI BLEEDING  . LOSS OF APPETITE  . DYSURIA  . Atrophic vaginitis  . Depression  . Fatigue  . Left ankle pain  . Left leg weakness  . Lumbago    PT - End of Session Activity Tolerance: Patient limited by pain General Behavior During Session: [facility] for tasks performed Cognition: [facility] for tasks performed  GP No functional reporting required  Juel Burrow 10/28/2011, 12:45 PM

## 2011-10-30 ENCOUNTER — Ambulatory Visit (HOSPITAL_COMMUNITY): Payer: 59

## 2011-10-30 ENCOUNTER — Other Ambulatory Visit: Payer: Self-pay | Admitting: Internal Medicine

## 2011-11-04 ENCOUNTER — Ambulatory Visit (HOSPITAL_COMMUNITY): Payer: 59

## 2011-11-06 ENCOUNTER — Ambulatory Visit (HOSPITAL_COMMUNITY): Payer: 59 | Admitting: Physical Therapy

## 2011-11-18 ENCOUNTER — Ambulatory Visit (INDEPENDENT_AMBULATORY_CARE_PROVIDER_SITE_OTHER): Payer: 59 | Admitting: Licensed Clinical Social Worker

## 2011-11-18 DIAGNOSIS — F4322 Adjustment disorder with anxiety: Secondary | ICD-10-CM

## 2011-11-19 ENCOUNTER — Encounter: Payer: Self-pay | Admitting: Internal Medicine

## 2011-11-19 ENCOUNTER — Ambulatory Visit (INDEPENDENT_AMBULATORY_CARE_PROVIDER_SITE_OTHER): Payer: 59 | Admitting: Internal Medicine

## 2011-11-19 ENCOUNTER — Other Ambulatory Visit (INDEPENDENT_AMBULATORY_CARE_PROVIDER_SITE_OTHER): Payer: 59

## 2011-11-19 VITALS — BP 100/72 | HR 106 | Temp 97.8°F | Ht 62.0 in | Wt 132.0 lb

## 2011-11-19 DIAGNOSIS — M79609 Pain in unspecified limb: Secondary | ICD-10-CM

## 2011-11-19 DIAGNOSIS — E039 Hypothyroidism, unspecified: Secondary | ICD-10-CM

## 2011-11-19 DIAGNOSIS — M79604 Pain in right leg: Secondary | ICD-10-CM

## 2011-11-19 DIAGNOSIS — D649 Anemia, unspecified: Secondary | ICD-10-CM

## 2011-11-19 LAB — CBC WITH DIFFERENTIAL/PLATELET
Basophils Absolute: 0 10*3/uL (ref 0.0–0.1)
Basophils Relative: 0.5 % (ref 0.0–3.0)
Eosinophils Absolute: 0.2 10*3/uL (ref 0.0–0.7)
Eosinophils Relative: 2.9 % (ref 0.0–5.0)
HCT: 38.4 % (ref 36.0–46.0)
Hemoglobin: 12.9 g/dL (ref 12.0–15.0)
Lymphocytes Relative: 31.8 % (ref 12.0–46.0)
Lymphs Abs: 2.5 10*3/uL (ref 0.7–4.0)
MCHC: 33.6 g/dL (ref 30.0–36.0)
MCV: 91.7 fl (ref 78.0–100.0)
Monocytes Absolute: 0.9 10*3/uL (ref 0.1–1.0)
Monocytes Relative: 11.3 % (ref 3.0–12.0)
Neutro Abs: 4.2 10*3/uL (ref 1.4–7.7)
Neutrophils Relative %: 53.5 % (ref 43.0–77.0)
Platelets: 243 10*3/uL (ref 150.0–400.0)
RBC: 4.18 Mil/uL (ref 3.87–5.11)
RDW: 12.9 % (ref 11.5–14.6)
WBC: 7.8 10*3/uL (ref 4.5–10.5)

## 2011-11-19 LAB — TSH: TSH: 8.42 u[IU]/mL — ABNORMAL HIGH (ref 0.35–5.50)

## 2011-11-19 NOTE — Progress Notes (Signed)
Subjective:    Patient ID: Melissa Kirby, female    DOB: 10/27/1960, 51 y.o.   MRN: [id number]  HPI complains of ongoing RLE pain ?precipitated by 12/12 fusion on L1 for LLE pain Not improved with PT, TENS and meds - started neurontin last week Working with same ortho - planning repeat MRI Has been out of work due to same since preop  Also here for follow up - reviewed chronic medical issues:  Depression - manifest as fatigue - seen x 2 12/2010 and again 01/2011 for same -  ?concern for MS in 12/2010 -> MRI brain for same unremrkable Also complicated by FM - prev tx with lyrica - med poorly tolerated  resumed low dose nortriptyline 12/2010, dose increased 01/2011 - prev on same for migraine prophylaxis but off since 2011 - [place] improved, especially with counseling (susan bond) but frustrated by pain in RLE (see above)  hypothyroid - prev followed with local endo for same -complicated hx reviewed. reports compliance with ongoing medical treatment, reviewed dose changes. denies adverse side effects related to current therapy.  no weight loss, no skin changes  headaches/migraines - prev followed with neuro for same but not seen since stablized symptoms. Weaned off topamax >45mos and off pamelor (until 12/2010). headache worst with stress and when " thyroid is off"; initially improved symptoms with gluten free diet. Now persisting low grade symptoms  - FMLA forms for related absence signed October 2012  anemia, hx iron defic, also found to have B12 defic 09/2009 - status post eval by GI: EGD/colo 10/2009 negative for celiac dz but improved symptoms with diet changes - follows gluten free diet and takes B12 pills  GERD - reports compliance with ongoing medical treatment and no changes in medication dose or frequency. denies adverse side effects related to current therapy.  Ortho problems - L ankle ankle pain - s/p ortho eval (draper)>>felt related to lumbar back issues - s/p ESI x 2 with minimal  improvement in symptoms, then L1 fusion 06/2011; now RLE pain since surgery -  Out of work since 10/18 - initially due to LLE, now RLE pain  Past Medical History  Diagnosis Date  . VITAMIN B12 DEFICIENCY 09/2009 dx  . Unspecified hypothyroidism 1982/2000    surgical resection of benign growth - partial then complete  . GERD (gastroesophageal reflux disease)     per endo. - does not take any meds.    Marland Kitchen MIGRAINE HEADACHE     last Migraine 06/29/2011-   . ANEMIA, IRON DEFICIENCY   . Kidney stones 1998, 2005    x 2  . Depression     therapy  . Neuromuscular disorder     L eft  tingling  . Fibromyalgia 1999    no meds    Review of Systems  Constitutional: Positive for fatigue. Negative for fever and unexpected weight change.  Respiratory: Negative for shortness of breath.   Cardiovascular: Negative for chest pain.      Objective:   Physical Exam  BP 100/72  Pulse 106  Temp(Src) 97.8 F (36.6 C) (Oral)  Ht 5\' 2"  (1.575 m)  Wt 132 lb (59.875 kg)  BMI 24.14 kg/m2  SpO2 96% Wt Readings from Last 3 Encounters:  11/19/11 132 lb (59.875 kg)  08/21/11 128 lb 6.4 oz (58.242 kg)  07/09/11 129 lb (58.514 kg)   Constitutional: She is appears well-developed and well-nourished. No distress, less fatigued appearing.  Neck: Normal range of motion, supple. No JVD present. No  mass, thyroidectomy scar well healed Cardiovascular: Normal rate, regular rhythm and normal heart sounds.  No murmur heard. No BLE edema Pulmonary/Chest: Effort normal and breath sounds normal. No respiratory distress. She has no wheezes.  Psyc: less anxious and depressed appearing than prior visits-   Lab Results  Component Value Date   WBC 5.5 08/21/2011   HGB 11.7* 08/21/2011   HCT 34.4* 08/21/2011   PLT 251.0 08/21/2011   CHOL 178 10/03/2009   TRIG 71.0 10/03/2009   HDL 70.50 10/03/2009   ALT 17 07/04/2011   AST 22 07/04/2011   NA 139 07/04/2011   K 3.9 07/04/2011   CL 105 07/04/2011   CREATININE 0.75  07/04/2011   BUN 7 07/04/2011   CO2 25 07/04/2011   TSH 0.16* 08/21/2011   INR 1.02 07/04/2011      Assessment & Plan:  See problem list. Medications and labs reviewed today.  RLE pain - etiology unclear since post op 06/2011 for L1 fusion - L ankle pain improved  ?SI pain or other - working with ortho - last OV and meds reviewed - planning repeat MRI

## 2011-11-19 NOTE — Patient Instructions (Signed)
It was good to see you today. We have reviewed your interval medical history today Test(s) ordered today. Your results will be called to you after review (48-72hours after test completion). If any changes need to be made, you will be notified at that time. Medications reviewed, no changes at this time. Please schedule followup in 3-6 months to review symptoms and medications, call sooner if problems.

## 2011-11-19 NOTE — Assessment & Plan Note (Signed)
ABL loss 06/2011 periop Chronic issues related iron defic Recheck CBC now

## 2011-11-19 NOTE — Assessment & Plan Note (Signed)
S/p total thyroidectomy 2000, dose changes reviewed recheck now - titrate as needed Lab Results  Component Value Date   TSH 0.16* 08/21/2011

## 2011-11-21 ENCOUNTER — Other Ambulatory Visit: Payer: Self-pay | Admitting: Orthopedic Surgery

## 2011-11-21 DIAGNOSIS — M545 Low back pain, unspecified: Secondary | ICD-10-CM

## 2011-12-03 ENCOUNTER — Ambulatory Visit
Admission: RE | Admit: 2011-12-03 | Discharge: 2011-12-03 | Disposition: A | Discharge status: Home / Self Care | Payer: 59 | Source: Ambulatory Visit | Attending: Orthopedic Surgery | Admitting: Orthopedic Surgery

## 2011-12-03 DIAGNOSIS — M545 Low back pain, unspecified: Secondary | ICD-10-CM

## 2011-12-04 ENCOUNTER — Other Ambulatory Visit: Payer: 59

## 2011-12-16 ENCOUNTER — Ambulatory Visit (INDEPENDENT_AMBULATORY_CARE_PROVIDER_SITE_OTHER): Payer: 59 | Admitting: Licensed Clinical Social Worker

## 2011-12-16 DIAGNOSIS — F4322 Adjustment disorder with anxiety: Secondary | ICD-10-CM

## 2011-12-19 ENCOUNTER — Other Ambulatory Visit: Payer: Self-pay | Admitting: Orthopedic Surgery

## 2011-12-19 DIAGNOSIS — M545 Low back pain, unspecified: Secondary | ICD-10-CM

## 2011-12-23 ENCOUNTER — Ambulatory Visit
Admission: RE | Admit: 2011-12-23 | Discharge: 2011-12-23 | Disposition: A | Discharge status: Home / Self Care | Payer: 59 | Source: Ambulatory Visit | Attending: Orthopedic Surgery | Admitting: Orthopedic Surgery

## 2011-12-23 DIAGNOSIS — M545 Low back pain, unspecified: Secondary | ICD-10-CM

## 2012-01-08 ENCOUNTER — Encounter: Payer: Self-pay | Admitting: *Deleted

## 2012-01-08 ENCOUNTER — Encounter: Payer: Self-pay | Admitting: Internal Medicine

## 2012-01-08 ENCOUNTER — Ambulatory Visit (INDEPENDENT_AMBULATORY_CARE_PROVIDER_SITE_OTHER): Payer: 59 | Admitting: Internal Medicine

## 2012-01-08 VITALS — BP 100/78 | HR 99 | Temp 97.6°F | Ht 62.0 in | Wt 134.1 lb

## 2012-01-08 DIAGNOSIS — F32A Depression, unspecified: Secondary | ICD-10-CM

## 2012-01-08 DIAGNOSIS — M549 Dorsalgia, unspecified: Secondary | ICD-10-CM

## 2012-01-08 DIAGNOSIS — M797 Fibromyalgia: Secondary | ICD-10-CM

## 2012-01-08 DIAGNOSIS — F329 Major depressive disorder, single episode, unspecified: Secondary | ICD-10-CM

## 2012-01-08 DIAGNOSIS — IMO0001 Reserved for inherently not codable concepts without codable children: Secondary | ICD-10-CM

## 2012-01-08 MED ORDER — NORTRIPTYLINE HCL 75 MG PO CAPS: 75.0000 mg | ORAL_CAPSULE | Freq: Every day | ORAL | Status: DC

## 2012-01-08 NOTE — Patient Instructions (Signed)
It was good to see you today. we'll make referral to new back specialist (neurosurg). Our office will contact you regarding appointment(s) once made. Increase Pamelor to 75mg  nightly - Your prescription(s) have been submitted to your pharmacy. Please take as directed and contact our office if you believe you are having problem(s) with the medication(s). Will complete your work papers as requested Keep appointment with rheumatology PA (m young) on 01/20/12 Please schedule followup in 1 month, call sooner if problems.

## 2012-01-08 NOTE — Progress Notes (Signed)
Subjective:    Patient ID: Melissa Kirby, female    DOB: 19-Jul-1961, 51 y.o.   MRN: [id number]  HPI complains of ongoing pain Onset precipitated by 12/12 fusion on L1 for LLE pain Not improved with PT, TENS and meds -  Working with same ortho - Has been out of work due to same since preop (05/08/11) Needs new opinion - pending rheum eval with PA at [facility] 01/20/12, ?different back specialist  Also reviewed chronic medical issues:  Depression - manifest as fatigue - seen x 2 12/2010 and again 01/2011 for same -  ?concern for MS in 12/2010 -> MRI brain for same unremrkable Also complicated by FM - prev tx with lyrica - med poorly tolerated  resumed low dose nortriptyline 12/2010, dose increased 01/2011 - prev on same for migraine prophylaxis but off since 2011 - [place] improved, especially with counseling (susan bond) but frustrated by pain in RLE (see above)  Ortho problems - see above - s/p ortho eval (draper) 02/2011>>felt related to lumbar back issues - s/p ESI x 2 with minimal improvement in symptoms, then L1 fusion 06/2011 (dumonski); then RLE pain since surgery -  Out of work since 05/08/11 - initially due to LLE/ankle pain, then RLE pain, now LBP with R>L LE pain  Past Medical History  Diagnosis Date  . VITAMIN B12 DEFICIENCY 09/2009 dx  . Unspecified hypothyroidism 1982/2000    surgical resection of benign growth - partial then complete  . GERD (gastroesophageal reflux disease)     per endo. - does not take any meds.    Marland Kitchen MIGRAINE HEADACHE     last Migraine 06/29/2011-   . ANEMIA, IRON DEFICIENCY   . Kidney stones 1998, 2005    x 2  . Depression     therapy  . Neuromuscular disorder     L eft  tingling  . Fibromyalgia 1999    no meds    Review of Systems  Constitutional: Positive for fatigue. Negative for fever and unexpected weight change.  Cardiovascular: Negative for chest pain.  Musculoskeletal: Positive for back pain. Negative for joint swelling and gait problem.    Psychiatric/Behavioral: Positive for disturbed wake/sleep cycle, dysphoric mood and decreased concentration. Negative for self-injury. The patient is nervous/anxious.       Objective:   Physical Exam  BP 100/78  Pulse 99  Temp 97.6 F (36.4 C) (Oral)  Ht 5\' 2"  (1.575 m)  Wt 134 lb 1.9 oz (60.836 kg)  BMI 24.53 kg/m2  SpO2 97% Wt Readings from Last 3 Encounters:  01/08/12 134 lb 1.9 oz (60.836 kg)  11/19/11 132 lb (59.875 kg)  08/21/11 128 lb 6.4 oz (58.242 kg)   Constitutional: She is appears well-developed and well-nourished. No distress, but fatigued appearing.  Neck: Normal range of motion, supple. No JVD present. No mass, thyroidectomy scar well healed Cardiovascular: Normal rate, regular rhythm and normal heart sounds.  No murmur heard. No BLE edema Pulmonary/Chest: Effort normal and breath sounds normal. No respiratory distress. She has no wheezes.  Psyc: very anxious and depressed appearing -   Lab Results  Component Value Date   WBC 7.8 11/19/2011   HGB 12.9 11/19/2011   HCT 38.4 11/19/2011   PLT 243.0 11/19/2011   CHOL 178 10/03/2009   TRIG 71.0 10/03/2009   HDL 70.50 10/03/2009   ALT 17 07/04/2011   AST 22 07/04/2011   NA 139 07/04/2011   K 3.9 07/04/2011   CL 105 07/04/2011   CREATININE 0.75  07/04/2011   BUN 7 07/04/2011   CO2 25 07/04/2011   TSH 8.42* 11/19/2011   INR 1.02 07/04/2011      Assessment & Plan:   RLE pain/LBP - etiology unclear - ongoing since post op 06/2011 for L1 fusion - initial L ankle pain 02/2011 somewhat improved - No longer working with ortho - last OV and meds reviewed -  Refer to Nsurg -  office disability papers for monthly visit pending est with new provider for back issues -  fibromyalgia - encouraged to keep 01/20/12 appt with rheum PA as planned - increase pamelor (also for increase depression)  Depression - increase pamelor and continue counseling

## 2012-02-04 ENCOUNTER — Encounter: Payer: Self-pay | Admitting: Internal Medicine

## 2012-02-04 ENCOUNTER — Ambulatory Visit (INDEPENDENT_AMBULATORY_CARE_PROVIDER_SITE_OTHER): Payer: 59 | Admitting: Internal Medicine

## 2012-02-04 ENCOUNTER — Encounter: Payer: Self-pay | Admitting: *Deleted

## 2012-02-04 VITALS — BP 110/82 | HR 119 | Temp 99.3°F | Ht 62.0 in | Wt 133.8 lb

## 2012-02-04 DIAGNOSIS — M79609 Pain in unspecified limb: Secondary | ICD-10-CM

## 2012-02-04 DIAGNOSIS — IMO0001 Reserved for inherently not codable concepts without codable children: Secondary | ICD-10-CM

## 2012-02-04 DIAGNOSIS — E039 Hypothyroidism, unspecified: Secondary | ICD-10-CM

## 2012-02-04 DIAGNOSIS — F329 Major depressive disorder, single episode, unspecified: Secondary | ICD-10-CM

## 2012-02-04 DIAGNOSIS — M79604 Pain in right leg: Secondary | ICD-10-CM

## 2012-02-04 DIAGNOSIS — F32A Depression, unspecified: Secondary | ICD-10-CM

## 2012-02-04 DIAGNOSIS — M797 Fibromyalgia: Secondary | ICD-10-CM

## 2012-02-04 NOTE — Assessment & Plan Note (Signed)
RLE pain/LBP - etiology unclear - ongoing since post op 06/2011 for L1 fusion - initial L ankle pain 02/2011 somewhat improved - No longer working with ortho - last OV and meds reviewed -  S/p evaluation by rheum earlier this month nd referred to pain mgmt Dr Jordan Likes  Planning injection 02/17/12 and renewed PT-  Will continue to complete office disability papers for monthly visit pending eval by pain mgmt provider for back issues -

## 2012-02-04 NOTE — Progress Notes (Signed)
Subjective:    Patient ID: Melissa Kirby, female    DOB: February 07, 1961, 51 y.o.   MRN: [id number]  HPI  Here for follow up of ongoing pain Onset precipitated by 06/2011 fusion on L1 for LLE pain Not improved with PT, TENS and meds -  No longer working with ortho - Has been out of work due to same since preop (05/08/11) S/p  rheum eval with PA at [facility] 01/20/12, and pain mgmt - injection with Dr Jordan Likes planned 7/30 Pt "case" was declined by back specialist stern  Also reviewed chronic medical issues:  Depression - manifest as fatigue - seen x 2 12/2010 and again 01/2011 for same -  ?concern for MS in 12/2010 -> MRI brain for same unremrkable Also complicated by FM - prev tx with lyrica - med poorly tolerated  resumed low dose nortriptyline 12/2010, dose increased 01/2011 - prev on same for migraine prophylaxis but off since 2011 - [place] improved, especially with counseling (susan bond) but frustrated by pain in RLE (see above)  Ortho problems - see above - s/p ortho eval (draper) 02/2011>>felt related to lumbar back issues - s/p ESI x 2 with minimal improvement in symptoms, then L1 fusion 06/2011 (dumonski); then RLE pain since surgery -  Out of work since 05/08/11 - initially due to LLE/ankle pain, then RLE pain, now LBP with R>L LE pain  Past Medical History  Diagnosis Date  . VITAMIN B12 DEFICIENCY 09/2009 dx  . Unspecified hypothyroidism 1982/2000    surgical resection of benign growth - partial then complete  . GERD (gastroesophageal reflux disease)     per endo. - does not take any meds.    Marland Kitchen MIGRAINE HEADACHE     last Migraine 06/29/2011-   . ANEMIA, IRON DEFICIENCY   . Kidney stones 1998, 2005    x 2  . Depression     therapy  . Neuromuscular disorder     L eft  tingling  . Fibromyalgia 1999    no meds    Review of Systems  Constitutional: Positive for fatigue. Negative for fever and unexpected weight change.  Cardiovascular: Negative for chest pain.  Musculoskeletal: Positive  for back pain. Negative for joint swelling and gait problem.  Psychiatric/Behavioral: Positive for disturbed wake/sleep cycle, dysphoric mood and decreased concentration. Negative for self-injury. The patient is nervous/anxious.       Objective:   Physical Exam  BP 110/82  Pulse 119  Temp 99.3 F (37.4 C) (Oral)  Ht 5\' 2"  (1.575 m)  Wt 133 lb 12.8 oz (60.691 kg)  BMI 24.47 kg/m2  SpO2 97% Wt Readings from Last 3 Encounters:  02/04/12 133 lb 12.8 oz (60.691 kg)  01/08/12 134 lb 1.9 oz (60.836 kg)  11/19/11 132 lb (59.875 kg)   Constitutional: She is appears well-developed and well-nourished. No distress, but fatigued appearing.  Neck: Normal range of motion, supple. No JVD present. No mass, thyroidectomy scar well healed Cardiovascular: Normal rate, regular rhythm and normal heart sounds.  No murmur heard. No BLE edema Pulmonary/Chest: Effort normal and breath sounds normal. No respiratory distress. She has no wheezes.  Psyc: mildly anxious and depressed appearing -   Lab Results  Component Value Date   WBC 7.8 11/19/2011   HGB 12.9 11/19/2011   HCT 38.4 11/19/2011   PLT 243.0 11/19/2011   CHOL 178 10/03/2009   TRIG 71.0 10/03/2009   HDL 70.50 10/03/2009   ALT 17 07/04/2011   AST 22 07/04/2011   NA  139 07/04/2011   K 3.9 07/04/2011   CL 105 07/04/2011   CREATININE 0.75 07/04/2011   BUN 7 07/04/2011   CO2 25 07/04/2011   TSH 8.42* 11/19/2011   INR 1.02 07/04/2011      Assessment & Plan:   See problem list. Medications and labs reviewed today.

## 2012-02-04 NOTE — Assessment & Plan Note (Signed)
exac by chronic pain In counseling with Judithe Modest increased pamelor 01/08/12

## 2012-02-04 NOTE — Assessment & Plan Note (Signed)
fibromyalgia - s/p 01/20/12 appt with rheum PA . Referred to pain mgmt increased pamelor (also for increase depression) 01/08/12 Continue other meds as rx'd

## 2012-02-04 NOTE — Patient Instructions (Signed)
It was good to see you today. We have reviewed your interval history today Medications reviewed, no changes at this time. Continue working with your specialists as ongoing Will complete your work papers as requested - work note provided for today Keep appointment with pain management Dr Jordan Likes for injection on 02/17/12 Please schedule followup in 1 month, call sooner if problems.

## 2012-02-04 NOTE — Assessment & Plan Note (Signed)
S/p total thyroidectomy 2000, dose changes reviewed The current medical regimen is effective;  continue present plan and medications.  Lab Results  Component Value Date   TSH 8.42* 11/19/2011

## 2012-02-05 ENCOUNTER — Ambulatory Visit: Payer: 59 | Admitting: Internal Medicine

## 2012-02-27 ENCOUNTER — Ambulatory Visit: Payer: 59 | Admitting: Internal Medicine

## 2012-03-02 ENCOUNTER — Encounter: Payer: Self-pay | Admitting: Internal Medicine

## 2012-03-02 ENCOUNTER — Ambulatory Visit (INDEPENDENT_AMBULATORY_CARE_PROVIDER_SITE_OTHER): Payer: 59 | Admitting: Internal Medicine

## 2012-03-02 ENCOUNTER — Encounter: Payer: Self-pay | Admitting: *Deleted

## 2012-03-02 VITALS — BP 102/78 | HR 105 | Temp 98.8°F | Ht 62.0 in | Wt 133.8 lb

## 2012-03-02 DIAGNOSIS — F3289 Other specified depressive episodes: Secondary | ICD-10-CM

## 2012-03-02 DIAGNOSIS — F32A Depression, unspecified: Secondary | ICD-10-CM

## 2012-03-02 DIAGNOSIS — M79604 Pain in right leg: Secondary | ICD-10-CM

## 2012-03-02 DIAGNOSIS — F329 Major depressive disorder, single episode, unspecified: Secondary | ICD-10-CM

## 2012-03-02 DIAGNOSIS — IMO0001 Reserved for inherently not codable concepts without codable children: Secondary | ICD-10-CM

## 2012-03-02 DIAGNOSIS — M79609 Pain in unspecified limb: Secondary | ICD-10-CM

## 2012-03-02 DIAGNOSIS — M797 Fibromyalgia: Secondary | ICD-10-CM

## 2012-03-02 DIAGNOSIS — E039 Hypothyroidism, unspecified: Secondary | ICD-10-CM

## 2012-03-02 NOTE — Assessment & Plan Note (Signed)
exac by chronic pain In counseling with Judithe Modest increased pamelor 01/08/12 The current medical regimen is effective;  continue present plan and medications.

## 2012-03-02 NOTE — Assessment & Plan Note (Signed)
RLE pain/LBP - etiology unclear - ongoing since post op 06/2011 for L1 fusion - initial L ankle pain 02/2011 somewhat improved - No longer working with ortho - last OV and meds reviewed -  S/p evaluation by rheum 01/2012 and referred to pain mgmt Dr Jordan Likes  S/p lumbar injection 02/17/12 - improved back but continued RLE pain planning PT and 2nd injection 8/26 Will continue to complete office disability papers for monthly visit pending eval by pain mgmt provider for back issues -

## 2012-03-02 NOTE — Progress Notes (Signed)
Subjective:    Patient ID: Melissa Kirby, female    DOB: [birth year], 51 y.o.   MRN: [id number]  HPI  Here for follow up of ongoing pain Onset precipitated by 06/2011 fusion on L1 for LLE pain Not improved with PT, TENS and meds -  No longer working with ortho - Has been out of work due to same since preop (05/08/11) S/p  rheum eval with PA at [facility] 01/20/12, and pain mgmt -  S/p lumbar injection with Dr Jordan Likes 7/30, next planned 8/27 given improvement in back pain, but continued RLE Pt "case" was declined by back specialist stern  Also reviewed chronic medical issues:  Depression - manifest as fatigue - seen x 2 12/2010 and again 01/2011 for same -  ?concern for MS in 12/2010 -> MRI brain for same unremrkable Also complicated by FM - prev tx with lyrica - med poorly tolerated  resumed low dose nortriptyline 12/2010, dose increased 01/2011 - prev on same for migraine prophylaxis but off since 2011 - [place] improved, especially with counseling (susan bond) but frustrated by pain in RLE (see above)  Ortho problems - see above - s/p ortho eval (draper) 02/2011>>felt related to lumbar back issues - s/p ESI x 2 with minimal improvement in symptoms, then L1 fusion 06/2011 (dumonski); then RLE pain since surgery -  Out of work since 05/08/11 - initially due to LLE/ankle pain, then RLE pain, now LBP with R>L LE pain  Past Medical History  Diagnosis Date  . VITAMIN B12 DEFICIENCY 09/2009 dx  . Unspecified hypothyroidism 1982/2000    surgical resection of benign growth - partial then complete  . GERD (gastroesophageal reflux disease)     per endo. - does not take any meds.    Marland Kitchen MIGRAINE HEADACHE     last Migraine 06/29/2011-   . ANEMIA, IRON DEFICIENCY   . Kidney stones 1998, 2005    x 2  . Depression     therapy  . Fibromyalgia 1999    no meds    Review of Systems  Constitutional: Positive for fatigue. Negative for fever and unexpected weight change.  Cardiovascular: Negative for chest pain.    Musculoskeletal: Positive for back pain. Negative for joint swelling and gait problem.  Psychiatric/Behavioral: Positive for disturbed wake/sleep cycle, dysphoric mood and decreased concentration. Negative for self-injury. The patient is nervous/anxious.       Objective:   Physical Exam  BP 102/78  Pulse 105  Temp 98.8 F (37.1 C) (Oral)  Ht 5\' 2"  (1.575 m)  Wt 133 lb 12.8 oz (60.691 kg)  BMI 24.47 kg/m2  SpO2 90% Wt Readings from Last 3 Encounters:  03/02/12 133 lb 12.8 oz (60.691 kg)  02/04/12 133 lb 12.8 oz (60.691 kg)  01/08/12 134 lb 1.9 oz (60.836 kg)   Constitutional: She is appears well-developed and well-nourished. No distress, but fatigued appearing.  Neck: Normal range of motion, supple. No JVD present. No mass, thyroidectomy scar well healed Cardiovascular: Normal rate, regular rhythm and normal heart sounds.  No murmur heard. No BLE edema Pulmonary/Chest: Effort normal and breath sounds normal. No respiratory distress. She has no wheezes.  Psyc: mildly anxious and depressed appearing -   Lab Results  Component Value Date   WBC 7.8 11/19/2011   HGB 12.9 11/19/2011   HCT 38.4 11/19/2011   PLT 243.0 11/19/2011   CHOL 178 10/03/2009   TRIG 71.0 10/03/2009   HDL 70.50 10/03/2009   ALT 17 07/04/2011   AST 22  07/04/2011   NA 139 07/04/2011   K 3.9 07/04/2011   CL 105 07/04/2011   CREATININE 0.75 07/04/2011   BUN 7 07/04/2011   CO2 25 07/04/2011   TSH 8.42* 11/19/2011   INR 1.02 07/04/2011      Assessment & Plan:   See problem list. Medications and labs reviewed today.

## 2012-03-02 NOTE — Patient Instructions (Signed)
It was good to see you today. We have reviewed your interval history today Medications reviewed, no changes at this time. Continue working with your specialists as ongoing Will complete your work papers as requested - work note provided for today Keep appointment with pain management Dr Jordan Likes for injection on 03/15/12 Please schedule followup in 1 month, call sooner if problems.

## 2012-03-02 NOTE — Assessment & Plan Note (Signed)
S/p total thyroidectomy 2000, dose changes reviewed The current medical regimen is effective;  continue present plan and medications.  Lab Results  Component Value Date   TSH 8.42* 11/19/2011   

## 2012-03-02 NOTE — Assessment & Plan Note (Signed)
s/p 01/20/12 appt with rheum PA . Referred to pain mgmt increased pamelor (also for increase depression) 01/08/12 Continue other meds as rx'd: meloxicam, gabapentin

## 2012-03-03 ENCOUNTER — Ambulatory Visit: Payer: 59 | Admitting: Internal Medicine

## 2012-04-06 ENCOUNTER — Encounter: Payer: Self-pay | Admitting: General Practice

## 2012-04-06 ENCOUNTER — Encounter: Payer: Self-pay | Admitting: Internal Medicine

## 2012-04-06 ENCOUNTER — Ambulatory Visit (INDEPENDENT_AMBULATORY_CARE_PROVIDER_SITE_OTHER): Payer: 59 | Admitting: Internal Medicine

## 2012-04-06 VITALS — BP 100/76 | HR 95 | Temp 97.4°F | Resp 15 | Wt 135.0 lb

## 2012-04-06 DIAGNOSIS — M79609 Pain in unspecified limb: Secondary | ICD-10-CM

## 2012-04-06 DIAGNOSIS — M79604 Pain in right leg: Secondary | ICD-10-CM

## 2012-04-06 NOTE — Patient Instructions (Signed)
It was good to see you today. We have reviewed your interval history today Medications reviewed, no changes at this time. Continue working with your specialists as needed Will complete your work papers as requested - work note provided for today -to return to work September 23 on limited basis, working up to full time over next several weeks Please schedule followup in 2-3 months for thyroid check and med review, call sooner if problems.

## 2012-04-06 NOTE — Assessment & Plan Note (Signed)
RLE pain/LBP - etiology unclear - ongoing since post op 06/2011 for L1 fusion - Improved with ESI summer 2013 - ready to return to work No longer working with ortho - last OV and meds reviewed -  S/p evaluation by rheum 01/2012 and referred to pain mgmt Dr Jordan Likes  S/p lumbar injection 02/17/12 and 2nd injection 03/15/12 return to work on graduated time schedule starting Monday, September 23 Will continue to complete office disability papers as needed

## 2012-04-06 NOTE — Progress Notes (Signed)
Subjective:    Patient ID: Melissa Kirby, female    DOB: [birth year], 51 y.o.   MRN: [id number]  HPI  Here for follow up of ongoing pain - back and RLE Onset precipitated by 06/2011 fusion on L1 for LLE pain - residual LBP and new RLE pain Not improved with PT, TENS and various meds (NSAIDs, narcotics and neuropathic pain meds) -  Has been out of work due to same since preop (05/08/11) S/p  rheum eval with PA at [facility] 01/20/12, and pain mgmt 01/2102-  S/p lumbar injection with Dr Jordan Likes 7/30, 2nd 8/27 >marked improvement in back pain and RLE pain Ready to go back to work  Also reviewed chronic medical issues:  Depression - primary symptoms manifest as fatigue -  ?concern for MS in 12/2010 -> MRI brain for same unremrkable Also complicated by FM - prev tx with lyrica, but med poorly tolerated  resumed low dose nortriptyline 12/2010, dose increased 01/2011 Felling improved, especially with counseling (susan bond)   Ortho problems - see above - s/p ortho eval (draper) 02/2011>>felt related to lumbar back issues - s/p ESI x 2 with minimal improvement in symptoms, then L1 fusion 06/2011 (dumonski); then RLE pain since surgery -  Out of work since 05/08/11 - initially due to LLE/ankle pain, then RLE pain, now LBP with R>L LE pain  Past Medical History  Diagnosis Date  . VITAMIN B12 DEFICIENCY 09/2009 dx  . Unspecified hypothyroidism 1982/2000    surgical resection of benign growth - partial then complete  . GERD (gastroesophageal reflux disease)     per endo. - does not take any meds.    Marland Kitchen MIGRAINE HEADACHE     last Migraine 06/29/2011-   . ANEMIA, IRON DEFICIENCY   . Kidney stones 1998, 2005    x 2  . Depression     therapy  . Fibromyalgia 1999    no meds    Review of Systems  Constitutional: Positive for fatigue. Negative for fever and unexpected weight change.  Cardiovascular: Negative for chest pain.  Musculoskeletal: Positive for back pain. Negative for joint swelling and gait problem.   Psychiatric/Behavioral: Positive for disturbed wake/sleep cycle and dysphoric mood. Negative for self-injury.      Objective:   Physical Exam  BP 100/76  Pulse 95  Temp 97.4 F (36.3 C) (Oral)  Resp 15  Wt 135 lb (61.236 kg)  SpO2 97% Wt Readings from Last 3 Encounters:  04/06/12 135 lb (61.236 kg)  03/02/12 133 lb 12.8 oz (60.691 kg)  02/04/12 133 lb 12.8 oz (60.691 kg)   Constitutional: She is appears well-developed and well-nourished. No distress, but fatigued appearing.  Neck: Normal range of motion, supple. No JVD present. No mass, thyroidectomy scar well healed Cardiovascular: Normal rate, regular rhythm and normal heart sounds.  No murmur heard. No BLE edema Pulmonary/Chest: Effort normal and breath sounds normal. No respiratory distress. She has no wheezes.  Psyc: mildly anxious and depressed appearing -   Lab Results  Component Value Date   WBC 7.8 11/19/2011   HGB 12.9 11/19/2011   HCT 38.4 11/19/2011   PLT 243.0 11/19/2011   CHOL 178 10/03/2009   TRIG 71.0 10/03/2009   HDL 70.50 10/03/2009   ALT 17 07/04/2011   AST 22 07/04/2011   NA 139 07/04/2011   K 3.9 07/04/2011   CL 105 07/04/2011   CREATININE 0.75 07/04/2011   BUN 7 07/04/2011   CO2 25 07/04/2011   TSH 8.42* 11/19/2011  INR 1.02 07/04/2011      Assessment & Plan:   See problem list. Medications and labs reviewed today.  Time spent with pt today 25 minutes, greater than 50% time spent counseling patient on pain issues, medication review and completion of paperwork for return to work. Also review of interval records

## 2012-04-30 ENCOUNTER — Other Ambulatory Visit: Payer: Self-pay | Admitting: Internal Medicine

## 2012-05-10 ENCOUNTER — Other Ambulatory Visit (INDEPENDENT_AMBULATORY_CARE_PROVIDER_SITE_OTHER): Payer: 59

## 2012-05-10 ENCOUNTER — Ambulatory Visit (INDEPENDENT_AMBULATORY_CARE_PROVIDER_SITE_OTHER): Payer: 59 | Admitting: Internal Medicine

## 2012-05-10 ENCOUNTER — Telehealth: Payer: Self-pay | Admitting: Internal Medicine

## 2012-05-10 ENCOUNTER — Encounter: Payer: Self-pay | Admitting: Internal Medicine

## 2012-05-10 VITALS — BP 138/90 | HR 114 | Temp 98.5°F | Ht 63.0 in | Wt 136.0 lb

## 2012-05-10 DIAGNOSIS — M545 Low back pain, unspecified: Secondary | ICD-10-CM

## 2012-05-10 DIAGNOSIS — E538 Deficiency of other specified B group vitamins: Secondary | ICD-10-CM

## 2012-05-10 DIAGNOSIS — M797 Fibromyalgia: Secondary | ICD-10-CM

## 2012-05-10 DIAGNOSIS — IMO0001 Reserved for inherently not codable concepts without codable children: Secondary | ICD-10-CM

## 2012-05-10 MED ORDER — VITAMIN B-12 1000 MCG SL SUBL: 1.0000 | SUBLINGUAL_TABLET | Freq: Every day | SUBLINGUAL | Status: DC

## 2012-05-10 MED ORDER — PREDNISONE 10 MG PO TABS: ORAL_TABLET | ORAL | Status: DC

## 2012-05-10 MED ORDER — TRAMADOL HCL 50 MG PO TABS: 50.0000 mg | ORAL_TABLET | Freq: Two times a day (BID) | ORAL | Status: DC | PRN

## 2012-05-10 NOTE — Assessment & Plan Note (Signed)
Re-start B12 sl 

## 2012-05-10 NOTE — Assessment & Plan Note (Signed)
Continue with current prescription therapy as reflected on the Med list.  

## 2012-05-10 NOTE — Progress Notes (Signed)
Subjective:    Patient ID: Melissa Kirby, female    DOB: 02-02-1961, 51 y.o.   MRN: [id number]  HPI  Here for follow up of ongoing pain - back and B LEs - c/o recent exacerbation S/p 06/2011 fusion on L1 for LLE pain - residual LBP and new RLE pain Not improved with PT, TENS and various meds (NSAIDs, narcotics and neuropathic pain meds) -  Has been out of work due to same since preop (05/08/11) S/p  rheum eval with PA at [facility] 01/20/12, and pain mgmt 01/2102-  S/p lumbar injection with Dr Jordan Likes 7/30, 2nd 8/27 >marked improvement in back pain and RLE pain   Also reviewed chronic medical issues:  Depression - primary symptoms manifest as fatigue -  ?concern for MS in 12/2010 -> MRI brain for same unremrkable Also complicated by FM - prev tx with lyrica, but med poorly tolerated  resumed low dose nortriptyline 12/2010, dose increased 01/2011 Felling improved, especially with counseling (susan bond)   Ortho problems - see above - s/p ortho eval (draper) 02/2011>>felt related to lumbar back issues - s/p ESI x 2 with minimal improvement in symptoms, then L1 fusion 06/2011 (dumonski); then RLE pain since surgery -  Out of work since 05/08/11 - initially due to LLE/ankle pain, then RLE pain, now LBP with R>L LE pain  Past Medical History  Diagnosis Date  . VITAMIN B12 DEFICIENCY 09/2009 dx  . Unspecified hypothyroidism 1982/2000    surgical resection of benign growth - partial then complete  . GERD (gastroesophageal reflux disease)     per endo. - does not take any meds.    Marland Kitchen MIGRAINE HEADACHE     last Migraine 06/29/2011-   . ANEMIA, IRON DEFICIENCY   . Kidney stones 1998, 2005    x 2  . Depression     therapy  . Fibromyalgia 1999    no meds    Review of Systems  Constitutional: Positive for fatigue. Negative for fever and unexpected weight change.  Cardiovascular: Negative for chest pain.  Musculoskeletal: Positive for back pain. Negative for joint swelling and gait problem.    Psychiatric/Behavioral: Positive for disturbed wake/sleep cycle and dysphoric mood. Negative for self-injury.      Objective:   Physical Exam BP 138/90  Pulse 114  Temp 98.5 F (36.9 C) (Oral)  Ht 5\' 3"  (1.6 m)  Wt 136 lb (61.689 kg)  BMI 24.09 kg/m2  SpO2 97% Wt Readings from Last 3 Encounters:  05/10/12 136 lb (61.689 kg)  04/06/12 135 lb (61.236 kg)  03/02/12 133 lb 12.8 oz (60.691 kg)   Constitutional: She is appears well-developed and well-nourished. No distress, but fatigued appearing.  Neck: Normal range of motion, supple. No JVD present. No mass, thyroidectomy scar well healed Cardiovascular: Normal rate, regular rhythm and normal heart sounds.  No murmur heard. No BLE edema Pulmonary/Chest: Effort normal and breath sounds normal. No respiratory distress. She has no wheezes.  Psyc: mildly anxious and depressed appearing -  LS is tender with spastic paraspinal muscles  Lab Results  Component Value Date   WBC 7.8 11/19/2011   HGB 12.9 11/19/2011   HCT 38.4 11/19/2011   PLT 243.0 11/19/2011   CHOL 178 10/03/2009   TRIG 71.0 10/03/2009   HDL 70.50 10/03/2009   ALT 17 07/04/2011   AST 22 07/04/2011   NA 139 07/04/2011   K 3.9 07/04/2011   CL 105 07/04/2011   CREATININE 0.75 07/04/2011   BUN 7 07/04/2011  CO2 25 07/04/2011   TSH 8.42* 11/19/2011   INR 1.02 07/04/2011      Assessment & Plan:

## 2012-05-10 NOTE — Telephone Encounter (Signed)
Caller: Amauri/Patient; Patient Name: Melissa Kirby; PCP: Rene Paci (Adults only); Best Callback Phone Number: [phone] Pt had back surgery in December - she has been out of work since then. Pt has gone back to work over the past couple of weeks part time hours. This w/e she developed high blood pressure 159/110 and pain between the back of her shoulder blades. she is calling to ask if she can take Aspirin with all the other meds she takes. RN advised an appt. Appt scheduled today @ 4:30 with Dr. Posey Rea.

## 2012-05-10 NOTE — Assessment & Plan Note (Signed)
See meds Vit d

## 2012-05-11 LAB — VITAMIN D 25 HYDROXY (VIT D DEFICIENCY, FRACTURES): Vit D, 25-Hydroxy: 36 ng/mL (ref 30–89)

## 2012-05-13 LAB — VITAMIN B12: Vitamin B-12: 271 pg/mL (ref 211–911)

## 2012-05-16 ENCOUNTER — Telehealth: Payer: Self-pay | Admitting: Internal Medicine

## 2012-05-16 NOTE — Telephone Encounter (Signed)
Melissa Kirby, please, inform patient that her B12, Vit D are low nl - restart B12, Vit D Thx

## 2012-05-17 ENCOUNTER — Telehealth: Payer: Self-pay | Admitting: Internal Medicine

## 2012-05-17 NOTE — Telephone Encounter (Signed)
LMOVM for Pt to return call.  

## 2012-05-19 NOTE — Telephone Encounter (Signed)
Pt informed

## 2012-05-24 ENCOUNTER — Ambulatory Visit (INDEPENDENT_AMBULATORY_CARE_PROVIDER_SITE_OTHER): Payer: 59 | Admitting: Internal Medicine

## 2012-05-24 ENCOUNTER — Encounter: Payer: Self-pay | Admitting: Internal Medicine

## 2012-05-24 VITALS — BP 120/82 | HR 115 | Temp 98.8°F | Ht 63.0 in | Wt 138.8 lb

## 2012-05-24 DIAGNOSIS — M797 Fibromyalgia: Secondary | ICD-10-CM

## 2012-05-24 DIAGNOSIS — J069 Acute upper respiratory infection, unspecified: Secondary | ICD-10-CM

## 2012-05-24 DIAGNOSIS — IMO0001 Reserved for inherently not codable concepts without codable children: Secondary | ICD-10-CM

## 2012-05-24 DIAGNOSIS — M545 Low back pain, unspecified: Secondary | ICD-10-CM

## 2012-05-24 DIAGNOSIS — J209 Acute bronchitis, unspecified: Secondary | ICD-10-CM

## 2012-05-24 MED ORDER — HYDROCOD POLST-CHLORPHEN POLST 10-8 MG/5ML PO LQCR: 5.0000 mL | Freq: Two times a day (BID) | ORAL | Status: DC | PRN

## 2012-05-24 MED ORDER — AZITHROMYCIN 250 MG PO TABS: ORAL_TABLET | ORAL | Status: DC

## 2012-05-24 NOTE — Assessment & Plan Note (Signed)
s/p 01/20/12 appt with rheum PA . Referred to pain mgmt - RLE improved s/p ESI 02/2012 increased pamelor (also for depression) 01/08/12 Continue other meds as rx'd: meloxicam, gabapentin, tramadol

## 2012-05-24 NOTE — Patient Instructions (Signed)
It was good to see you today.  Zpak antibiotics and prescription cough syrup - Your prescription(s) have been submitted to your pharmacy. Please take as directed and contact our office if you believe you are having problem(s) with the medication(s).  Alternate between ibuprofen and tylenol for aches, pain and fever symptoms as discussed  Hydrate, rest and call if worse or unimproved 

## 2012-05-24 NOTE — Progress Notes (Signed)
  Subjective:   HPI  complains of head and chest cold symptoms  Onset >1 week ago, wax/wane symptoms  Initially associated with rhinorrhea, sneezing, sore throat, mild headache and low grade fever Now sinus pressure, hoarseness and mild-mod chest congestion No relief with OTC meds Precipitated by sick contacts -g-dtr  Past Medical History  Diagnosis Date  . VITAMIN B12 DEFICIENCY 09/2009 dx  . Unspecified hypothyroidism 1982/2000    surgical resection of benign growth - partial then complete  . GERD (gastroesophageal reflux disease)     per endo. - does not take any meds.    Marland Kitchen MIGRAINE HEADACHE     last Migraine 06/29/2011-   . ANEMIA, IRON DEFICIENCY   . Kidney stones 1998, 2005    x 2  . Depression     therapy  . Fibromyalgia 1999    no meds    Review of Systems Constitutional: No fever, no unexpected weight change Pulmonary: No pleurisy or hemoptysis Cardiovascular: No chest pain or palpitations     Objective:   Physical Exam BP 120/82  Pulse 115  Temp 98.8 F (37.1 C) (Oral)  Ht 5\' 3"  (1.6 m)  Wt 138 lb 12.8 oz (62.959 kg)  BMI 24.59 kg/m2  SpO2 95% GEN: mildly ill appearing and audible head/chest congestion HENT: NCAT, mild sinus tenderness bilaterally, nares with clear discharge, oropharynx mild erythema, no exudate Eyes: Vision grossly intact, no conjunctivitis Lungs: Few rhonchi scattered bilaterally without wheeze, no increased work of breathing Cardiovascular: Regular rate and rhythm, no bilateral edema  Lab Results  Component Value Date   WBC 7.8 11/19/2011   HGB 12.9 11/19/2011   HCT 38.4 11/19/2011   PLT 243.0 11/19/2011   GLUCOSE 86 07/04/2011   CHOL 178 10/03/2009   TRIG 71.0 10/03/2009   HDL 70.50 10/03/2009   LDLCALC 93 10/03/2009   ALT 17 07/04/2011   AST 22 07/04/2011   NA 139 07/04/2011   K 3.9 07/04/2011   CL 105 07/04/2011   CREATININE 0.75 07/04/2011   BUN 7 07/04/2011   CO2 25 07/04/2011   TSH 8.42* 11/19/2011   INR 1.02 07/04/2011     Assessment & Plan:  Viral URI with Cough Progression to acute bronchitis sinusitis and postnasal drip related to above   Empiric antibiotics prescribed due to symptom duration greater than 7 days Prescription cough suppression - new prescriptions done Symptomatic care with Tylenol or Advil, hydration and rest -  salt gargle advised as needed

## 2012-05-24 NOTE — Assessment & Plan Note (Signed)
Improved with addition of tramadol October 2013 Continue treatment as her right leg pain and chronic radiculopathy symptoms + fibromyalgia

## 2012-05-29 ENCOUNTER — Encounter (HOSPITAL_COMMUNITY): Payer: Self-pay

## 2012-05-29 ENCOUNTER — Emergency Department (HOSPITAL_COMMUNITY): Payer: 59

## 2012-05-29 ENCOUNTER — Emergency Department (HOSPITAL_COMMUNITY)
Admission: EM | Admit: 2012-05-29 | Discharge: 2012-05-29 | Disposition: A | Discharge status: Home / Self Care | Payer: 59 | Attending: Emergency Medicine | Admitting: Emergency Medicine

## 2012-05-29 DIAGNOSIS — Z87442 Personal history of urinary calculi: Secondary | ICD-10-CM | POA: Insufficient documentation

## 2012-05-29 DIAGNOSIS — G43909 Migraine, unspecified, not intractable, without status migrainosus: Secondary | ICD-10-CM | POA: Insufficient documentation

## 2012-05-29 DIAGNOSIS — D649 Anemia, unspecified: Secondary | ICD-10-CM | POA: Insufficient documentation

## 2012-05-29 DIAGNOSIS — R112 Nausea with vomiting, unspecified: Secondary | ICD-10-CM | POA: Insufficient documentation

## 2012-05-29 DIAGNOSIS — F3289 Other specified depressive episodes: Secondary | ICD-10-CM | POA: Insufficient documentation

## 2012-05-29 DIAGNOSIS — Z79899 Other long term (current) drug therapy: Secondary | ICD-10-CM | POA: Insufficient documentation

## 2012-05-29 DIAGNOSIS — R1013 Epigastric pain: Secondary | ICD-10-CM | POA: Insufficient documentation

## 2012-05-29 DIAGNOSIS — R197 Diarrhea, unspecified: Secondary | ICD-10-CM | POA: Insufficient documentation

## 2012-05-29 DIAGNOSIS — F329 Major depressive disorder, single episode, unspecified: Secondary | ICD-10-CM | POA: Insufficient documentation

## 2012-05-29 DIAGNOSIS — K219 Gastro-esophageal reflux disease without esophagitis: Secondary | ICD-10-CM | POA: Insufficient documentation

## 2012-05-29 LAB — CBC
HCT: 40.3 % (ref 36.0–46.0)
Hemoglobin: 13.3 g/dL (ref 12.0–15.0)
MCH: 30.7 pg (ref 26.0–34.0)
MCHC: 33 g/dL (ref 30.0–36.0)
MCV: 93.1 fL (ref 78.0–100.0)
Platelets: 284 10*3/uL (ref 150–400)
RBC: 4.33 MIL/uL (ref 3.87–5.11)
RDW: 13.2 % (ref 11.5–15.5)
WBC: 13.6 10*3/uL — ABNORMAL HIGH (ref 4.0–10.5)

## 2012-05-29 LAB — BASIC METABOLIC PANEL
BUN: 13 mg/dL (ref 6–23)
CO2: 23 mEq/L (ref 19–32)
Calcium: 9.7 mg/dL (ref 8.4–10.5)
Chloride: 101 mEq/L (ref 96–112)
Creatinine, Ser: 0.74 mg/dL (ref 0.50–1.10)
GFR calc Af Amer: >90 mL/min (ref >90)
GFR calc non Af Amer: >90 mL/min (ref >90)
Glucose, Bld: 125 mg/dL — ABNORMAL HIGH (ref 70–99)
Potassium: 3.9 mEq/L (ref 3.5–5.1)
Sodium: 138 mEq/L (ref 135–145)

## 2012-05-29 LAB — TROPONIN I: Troponin I: <0.3 ng/mL (ref <0.30)

## 2012-05-29 MED ORDER — ONDANSETRON HCL 4 MG PO TABS: 4.0000 mg | ORAL_TABLET | Freq: Four times a day (QID) | ORAL | Status: DC

## 2012-05-29 MED ORDER — LORAZEPAM 2 MG/ML IJ SOLN
1.0000 mg | Freq: Once | INTRAMUSCULAR | Status: AC
  Administered 2012-05-29: 1 mg via INTRAVENOUS
  Filled 2012-05-29: qty 1

## 2012-05-29 MED ORDER — FAMOTIDINE 20 MG PO TABS: 20.0000 mg | ORAL_TABLET | Freq: Two times a day (BID) | ORAL | Status: DC

## 2012-05-29 MED ORDER — HYDROMORPHONE HCL PF 1 MG/ML IJ SOLN
1.0000 mg | Freq: Once | INTRAMUSCULAR | Status: AC
  Administered 2012-05-29: 1 mg via INTRAVENOUS
  Filled 2012-05-29: qty 1

## 2012-05-29 MED ORDER — SODIUM CHLORIDE 0.9 % IV SOLN
INTRAVENOUS | Status: DC
  Administered 2012-05-29: 150 mL via INTRAVENOUS

## 2012-05-29 MED ORDER — ONDANSETRON HCL 4 MG/2ML IJ SOLN
4.0000 mg | Freq: Once | INTRAMUSCULAR | Status: AC
  Administered 2012-05-29: 4 mg via INTRAVENOUS
  Filled 2012-05-29: qty 2

## 2012-05-29 MED ORDER — PANTOPRAZOLE SODIUM 40 MG IV SOLR
40.0000 mg | Freq: Once | INTRAVENOUS | Status: AC
  Administered 2012-05-29: 40 mg via INTRAVENOUS
  Filled 2012-05-29: qty 40

## 2012-05-29 NOTE — ED Notes (Signed)
Much calmer. Responded well to hyperventilation coaching.

## 2012-05-29 NOTE — ED Provider Notes (Signed)
History     CSN: [id number]  Arrival date & time 05/29/12  0148   First MD Initiated Contact with Patient 05/29/12 0159      Chief Complaint  Patient presents with  . Chest Pain  . Emesis    (Consider location/radiation/quality/duration/timing/severity/associated sxs/prior treatment) HPI Hx per PT, woke tonight with sharp epigastric pain, had associated N/V unable to hold anything down. No F/C, has gluten intolerance and only able to eat very bland foods without having GI upset. No SOB, pain radiates to lower substernal area. Pain moderate to severe. Unrelieved by phenergan. Constant since onset no known agrevating or alleviating factors. Close family memeber recently sick with the same.  Past Medical History  Diagnosis Date  . VITAMIN B12 DEFICIENCY 09/2009 dx  . Unspecified hypothyroidism 1982/2000    surgical resection of benign growth - partial then complete  . GERD (gastroesophageal reflux disease)     per endo. - does not take any meds.    Marland Kitchen MIGRAINE HEADACHE     last Migraine 06/29/2011-   . ANEMIA, IRON DEFICIENCY   . Kidney stones 1998, 2005    x 2  . Depression     therapy  . Fibromyalgia 1999    no meds    Past Surgical History  Procedure Date  . Total thyroidectomy 2000    Had thyroid nodule, nonmalignant ([id number]) resection  . Cholecystectomy 2009  . Tubal ligation 1989  . Diagnostic laparoscopy 1982    diagnostic  . Thyroid surgery 1983    partial   . Posterior fusion lumbar spine 06/2011    Family History  Problem Relation Age of Onset  . Hyperlipidemia Mother   . Heart disease Mother   . Hyperlipidemia Father   . Breast cancer Maternal Grandmother   . Hyperlipidemia Maternal Grandmother   . Ovarian cancer Other   . Anesthesia problems Neg Hx   . Malignant hyperthermia Neg Hx   . Pseudochol deficiency Neg Hx   . Hypotension Neg Hx     History  Substance Use Topics  . Smoking status: Never Smoker   . Smokeless tobacco: Not on file   Comment: Married, lives with spouse. 3 children. 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 Alcohol Use: No    OB History    Grav Para Term Preterm Abortions TAB SAB Ect Mult Living                  Review of Systems  Constitutional: Negative for fever and chills.  HENT: Negative for neck pain and neck stiffness.   Eyes: Negative for pain.  Respiratory: Negative for shortness of breath.   Cardiovascular: Negative for leg swelling.  Gastrointestinal: Positive for nausea, vomiting, abdominal pain and diarrhea.  Genitourinary: Negative for dysuria.  Musculoskeletal: Negative for back pain.  Skin: Negative for rash.  Neurological: Negative for headaches.  All other systems reviewed and are negative.    Allergies  Compazine; Gluten meal; Metoclopramide hcl; and Morphine and related  Home Medications   Current Outpatient Rx  Name  Route  Sig  Dispense  Refill  . AZITHROMYCIN 250 MG PO TABS      Take 2 tablets (500 mg) on  Day 1,  followed by 1 tablet (250 mg) once daily on Days 2 through 5.   6 each   0   . CALCIUM-VITAMIN D 185 MG PO TABS   Oral   Take 1,000 mg by mouth daily.           Marland Kitchen  HYDROCOD POLST-CPM POLST ER 10-8 MG/5ML PO LQCR   Oral   Take 5 mLs by mouth every 12 (twelve) hours as needed.   140 mL   0   . CHLORZOXAZONE 750 MG PO TABS   Oral   Take by mouth. Take 1/2 tab at bedtime         . ESTROGENS, CONJUGATED 0.625 MG/GM VA CREA   Vaginal   Place 0.5 g vaginally 3 (three) times a week. Monday, Wednesday and Friday.          Marland Kitchen VITAMIN B-12 1000 MCG SL SUBL   Sublingual   Place 1 tablet (1,000 mcg total) under the tongue daily.   100 tablet   3   . DICLOFENAC SODIUM 1.5 % TD SOLN   Transdermal   Place onto the skin 4 (four) times daily as needed.         Marland Kitchen GABAPENTIN 300 MG PO CAPS   Oral   Take 300 mg by mouth 3 (three) times daily.         Marland Kitchen LEVOTHYROXINE SODIUM 150 MCG PO TABS   Oral   Take 150 mcg by mouth every  other day. Or as directed         . LEVOTHYROXINE SODIUM 175 MCG PO TABS   Oral   Take 175 mcg by mouth every other day.         Marland Kitchen LIDOCAINE 5 % EX OINT   Topical   Apply topically every 12 (twelve) hours.         Marland Kitchen ONE-DAILY MULTI VITAMINS PO TABS   Oral   Take 1 tablet by mouth daily.           Marland Kitchen NORTRIPTYLINE HCL 75 MG PO CAPS      TAKE ONE CAPSULE BY MOUTH AT BEDTIME   30 capsule   2   . PREDNISONE 10 MG PO TABS      Prednisone 10 mg: take 4 tabs a day x 3 days; then 3 tabs a day x 4 days; then 2 tabs a day x 4 days, then 1 tab a day x 6 days, then stop. Take pc.   38 tablet   1   . TRAMADOL HCL 50 MG PO TABS   Oral   Take 1-2 tablets (50-100 mg total) by mouth 2 (two) times daily as needed for pain.   100 tablet   2     BP 130/82  Pulse 120  Temp 99.5 F (37.5 C) (Oral)  Resp 20  Ht 5\' 3"  (1.6 m)  Wt 138 lb (62.596 kg)  BMI 24.45 kg/m2  SpO2 100%  Physical Exam  Constitutional: She is oriented to person, place, and time. She appears well-developed and well-nourished.  HENT:  Head: Normocephalic and atraumatic.  Eyes: EOM are normal. Pupils are equal, round, and reactive to light. No scleral icterus.  Neck: Neck supple.  Cardiovascular: Normal rate, regular rhythm and intact distal pulses.   Pulmonary/Chest: Effort normal and breath sounds normal. No respiratory distress. She has no wheezes. She has no rales. She exhibits no tenderness.  Abdominal: Soft. Bowel sounds are normal. She exhibits no distension. There is no rebound and no guarding.       Epigastric TTP, no peritonitis or tenderness otherwise  Musculoskeletal: Normal range of motion. She exhibits no edema.  Neurological: She is alert and oriented to person, place, and time.  Skin: Skin is warm and dry.    ED Course  Procedures (including critical care time)  Results for orders placed during the hospital encounter of 05/29/12  CBC      Component Value Range   WBC 13.6 (*) 4.0 -  10.5 K/uL   RBC 4.33  3.87 - 5.11 MIL/uL   Hemoglobin 13.3  12.0 - 15.0 g/dL   HCT 16.1  09.6 - 04.5 %   MCV 93.1  78.0 - 100.0 fL   MCH 30.7  26.0 - 34.0 pg   MCHC 33.0  30.0 - 36.0 g/dL   RDW 40.9  81.1 - 91.4 %   Platelets 284  150 - 400 K/uL  BASIC METABOLIC PANEL      Component Value Range   Sodium 138  135 - 145 mEq/L   Potassium 3.9  3.5 - 5.1 mEq/L   Chloride 101  96 - 112 mEq/L   CO2 23  19 - 32 mEq/L   Glucose, Bld 125 (*) 70 - 99 mg/dL   BUN 13  6 - 23 mg/dL   Creatinine, Ser 7.82  0.50 - 1.10 mg/dL   Calcium 9.7  8.4 - 95.6 mg/dL   GFR calc non Af Amer >90  >90 mL/min   GFR calc Af Amer >90  >90 mL/min  TROPONIN I      Component Value Range   Troponin I <0.30  <0.30 ng/mL   Dg Chest Portable 1 View  05/29/2012  *RADIOLOGY REPORT*  Clinical Data: Chest pain, emesis  PORTABLE CHEST - 1 VIEW  Comparison: 07/11/2011; 07/04/2011  Findings: Unchanged cardiac silhouette and mediastinal contours. No focal parenchymal opacities.  No pleural effusion or pneumothorax.  No acute osseous abnormalities.  IMPRESSION: No acute cardiopulmonary disease on this AP portable examination.   Original Report Authenticated By: Tacey Ruiz, MD     Date: 05/29/2012  Rate: 128  Rhythm: sinus tachycardia  QRS Axis: normal  Intervals: normal  ST/T Wave abnormalities: nonspecific ST changes  Conduction Disutrbances:none  Narrative Interpretation:   Old EKG Reviewed: none available  IVFs. IV zofran  remains nauseated and was given IV ativan, protonix and IV dilaudid for epigastric pain  5:30 AM feels completely better and requesting to be discharge home, exam improved. Labs, imaging and EKG reviewed. Plan zofran, pepcid and PCP follow up.   MDM   N/V/D and epigastric discomfort improved with IVFs, IV narcotics and medications above. ECG, labs and CXR. VS and nursing notes reviewed. PT agrees to abdominal pain precautions/ is a reliable historian.        Sunnie Nielsen, MD 05/29/12  [phone]

## 2012-05-29 NOTE — ED Notes (Signed)
Pt states her HR is always fast. Says she spent a week in the hospital for same and never did get a dx.

## 2012-05-29 NOTE — ED Notes (Signed)
Remains improved. For discharge.

## 2012-05-29 NOTE — ED Notes (Signed)
Pt started vomiting approx 1 hour ago, now with sharp pain in center of chest that goes through to her back.

## 2012-06-02 NOTE — Telephone Encounter (Signed)
x

## 2012-06-16 ENCOUNTER — Other Ambulatory Visit: Payer: Self-pay | Admitting: Internal Medicine

## 2012-07-07 ENCOUNTER — Ambulatory Visit (INDEPENDENT_AMBULATORY_CARE_PROVIDER_SITE_OTHER): Payer: 59 | Admitting: Internal Medicine

## 2012-07-07 ENCOUNTER — Other Ambulatory Visit (INDEPENDENT_AMBULATORY_CARE_PROVIDER_SITE_OTHER): Payer: 59

## 2012-07-07 ENCOUNTER — Encounter: Payer: Self-pay | Admitting: Internal Medicine

## 2012-07-07 VITALS — BP 100/78 | HR 114 | Temp 98.1°F | Ht 62.0 in | Wt 137.4 lb

## 2012-07-07 DIAGNOSIS — E039 Hypothyroidism, unspecified: Secondary | ICD-10-CM

## 2012-07-07 DIAGNOSIS — M79609 Pain in unspecified limb: Secondary | ICD-10-CM

## 2012-07-07 DIAGNOSIS — IMO0001 Reserved for inherently not codable concepts without codable children: Secondary | ICD-10-CM

## 2012-07-07 DIAGNOSIS — M79604 Pain in right leg: Secondary | ICD-10-CM

## 2012-07-07 DIAGNOSIS — J40 Bronchitis, not specified as acute or chronic: Secondary | ICD-10-CM

## 2012-07-07 DIAGNOSIS — M797 Fibromyalgia: Secondary | ICD-10-CM

## 2012-07-07 LAB — TSH: TSH: 14.87 u[IU]/mL — ABNORMAL HIGH (ref 0.35–5.50)

## 2012-07-07 MED ORDER — BENZONATATE 200 MG PO CAPS: 200.0000 mg | ORAL_CAPSULE | Freq: Three times a day (TID) | ORAL | Status: DC | PRN

## 2012-07-07 MED ORDER — AZITHROMYCIN 250 MG PO TABS: ORAL_TABLET | ORAL | Status: DC

## 2012-07-07 NOTE — Progress Notes (Signed)
  Subjective:    Patient ID: Melissa Kirby, female    DOB: 1961/01/24, 51 y.o.   MRN: [id number]  HPI  Here for follow up - reviewed chronic medical issues  Reports "cold" x last 24h  Past Medical History  Diagnosis Date  . VITAMIN B12 DEFICIENCY 09/2009 dx  . Unspecified hypothyroidism 1982/2000    surgical resection of benign growth - partial then complete  . GERD (gastroesophageal reflux disease)     per endo. - does not take any meds.    Marland Kitchen MIGRAINE HEADACHE     last Migraine 06/29/2011-   . ANEMIA, IRON DEFICIENCY   . Kidney stones 1998, 2005    x 2  . Depression     therapy  . Fibromyalgia 1999    no meds    Review of Systems  Constitutional: Positive for fatigue. Negative for fever and unexpected weight change.  Cardiovascular: Negative for chest pain.  Musculoskeletal: Negative for back pain, joint swelling and gait problem.  Psychiatric/Behavioral: Positive for sleep disturbance. Negative for self-injury and dysphoric mood.      Objective:   Physical Exam  BP 100/78  Pulse 114  Temp 98.1 F (36.7 C) (Oral)  Ht 5\' 2"  (1.575 m)  Wt 137 lb 6.4 oz (62.324 kg)  BMI 25.13 kg/m2  SpO2 97% Wt Readings from Last 3 Encounters:  07/07/12 137 lb 6.4 oz (62.324 kg)  05/29/12 138 lb (62.596 kg)  05/24/12 138 lb 12.8 oz (62.959 kg)   Constitutional: She is appears well-developed and well-nourished. No distress, but fatigued appearing.  Neck: Normal range of motion, supple. No JVD present. No mass, thyroidectomy scar well healed Cardiovascular: Normal rate, regular rhythm and normal heart sounds.  No murmur heard. No BLE edema Pulmonary/Chest: Effort normal and breath sounds with b rhonchi. No respiratory distress. She has no wheezes.  Psyc: mildly anxious and depressed appearing -   Lab Results  Component Value Date   WBC 13.6* 05/29/2012   HGB 13.3 05/29/2012   HCT 40.3 05/29/2012   PLT 284 05/29/2012   CHOL 178 10/03/2009   TRIG 71.0 10/03/2009   HDL 70.50 10/03/2009    ALT 17 07/04/2011   AST 22 07/04/2011   NA 138 05/29/2012   K 3.9 05/29/2012   CL 101 05/29/2012   CREATININE 0.74 05/29/2012   BUN 13 05/29/2012   CO2 23 05/29/2012   TSH 8.42* 11/19/2011   INR 1.02 07/04/2011      Assessment & Plan:   See problem list. Medications and labs reviewed today.  Viral URI>bronchitis - cough and headache, low grade fever with sinus pressure - Empiric Zpak and prescription cough meds - symptomatic care advised, to call if worse

## 2012-07-07 NOTE — Assessment & Plan Note (Signed)
S/p total thyroidectomy 2000, dose changes reviewed Check TH and adjust as needed The current medical regimen is effective;  continue present plan and medications.  Lab Results  Component Value Date   TSH 8.42* 11/19/2011

## 2012-07-07 NOTE — Assessment & Plan Note (Signed)
RLE pain/LBP - ongoing since post op 06/2011 for L1 fusion - Improved with ESI summer 2013 - slow return to work No longer working with ortho - last OV and meds reviewed -  S/p evaluation by rheum 01/2012 and referred to pain mgmt Dr Jordan Likes  S/p lumbar injection 02/17/12 and 2nd injection 03/15/12 return to work on graduated time schedule starting Monday, September 23 Uses tramasol prn

## 2012-07-07 NOTE — Patient Instructions (Signed)
It was good to see you today. Test(s) ordered today. Your results will be released to MyChart (or called to you) after review, usually within 72hours after test completion. If any changes need to be made, you will be notified at that same time. Zpak antibiotics and prescription cough medication - other medications reviewed and updated  Your prescription(s) have been submitted to your pharmacy. Please take as directed and contact our office if you believe you are having problem(s) with the medication(s). Alternate between ibuprofen and tylenol for aches, pain and fever symptoms as discussed Hydrate, rest and call if worse or unimproved Please schedule followup in 6 months thyroid, call sooner if problems.

## 2012-07-07 NOTE — Assessment & Plan Note (Signed)
s/p 01/20/12 appt with rheum PA > Referred to pain mgmt for same RLE improved s/p ESI 02/2012 increased pamelor (also for depression) 01/08/12 Continue other meds as rx'd: meloxicam, gabapentin, tramadol

## 2012-07-08 DIAGNOSIS — Z0279 Encounter for issue of other medical certificate: Secondary | ICD-10-CM

## 2012-07-13 ENCOUNTER — Ambulatory Visit (INDEPENDENT_AMBULATORY_CARE_PROVIDER_SITE_OTHER): Payer: 59 | Admitting: Internal Medicine

## 2012-07-13 ENCOUNTER — Encounter: Payer: Self-pay | Admitting: Internal Medicine

## 2012-07-13 VITALS — BP 118/82 | HR 99 | Temp 98.2°F | Ht 63.0 in | Wt 137.0 lb

## 2012-07-13 DIAGNOSIS — R058 Other specified cough: Secondary | ICD-10-CM

## 2012-07-13 DIAGNOSIS — R059 Cough, unspecified: Secondary | ICD-10-CM

## 2012-07-13 DIAGNOSIS — R05 Cough: Secondary | ICD-10-CM

## 2012-07-13 DIAGNOSIS — Z1239 Encounter for other screening for malignant neoplasm of breast: Secondary | ICD-10-CM

## 2012-07-13 DIAGNOSIS — E538 Deficiency of other specified B group vitamins: Secondary | ICD-10-CM

## 2012-07-13 DIAGNOSIS — E039 Hypothyroidism, unspecified: Secondary | ICD-10-CM

## 2012-07-13 DIAGNOSIS — J9801 Acute bronchospasm: Secondary | ICD-10-CM

## 2012-07-13 MED ORDER — PREDNISONE (PAK) 10 MG PO TABS: 10.0000 mg | ORAL_TABLET | ORAL | Status: DC

## 2012-07-13 MED ORDER — HYDROCODONE-HOMATROPINE 5-1.5 MG/5ML PO SYRP: 5.0000 mL | ORAL_SOLUTION | Freq: Four times a day (QID) | ORAL | Status: DC | PRN

## 2012-07-13 MED ORDER — LEVOTHYROXINE SODIUM 175 MCG PO TABS: 175.0000 ug | ORAL_TABLET | Freq: Every day | ORAL | Status: DC

## 2012-07-13 MED ORDER — ALBUTEROL SULFATE HFA 108 (90 BASE) MCG/ACT IN AERS: 2.0000 | INHALATION_SPRAY | Freq: Four times a day (QID) | RESPIRATORY_TRACT | Status: DC | PRN

## 2012-07-13 NOTE — Addendum Note (Signed)
Addended by: Rene Paci A on: 07/13/2012 08:57 AM   Modules accepted: Orders

## 2012-07-13 NOTE — Assessment & Plan Note (Signed)
S/p total thyroidectomy 2000 Last labs and dose changes reviewed The current medical regimen is effective;  continue present plan and medications.   Lab Results  Component Value Date   TSH 14.87* 07/07/2012

## 2012-07-13 NOTE — Progress Notes (Signed)
  Subjective:    HPI  complains of continued cough symptoms  Onset >1 week ago, wax/wane symptoms  Seen here last week for same> Zpak, Tussionex and tessalon completed Initially associated with rhinorrhea, sneezing, sore throat, mild headache and low grade fever Now persisting mod chest congestion but dry cough No relief with OTC/rx meds   Past Medical History  Diagnosis Date  . VITAMIN B12 DEFICIENCY 09/2009 dx  . Unspecified hypothyroidism 1982/2000    surgical resection of benign growth - partial then complete  . GERD (gastroesophageal reflux disease)     per endo. - does not take any meds.    Marland Kitchen MIGRAINE HEADACHE     last Migraine 06/29/2011-   . ANEMIA, IRON DEFICIENCY   . Kidney stones 1998, 2005    x 2  . Depression     therapy  . Fibromyalgia 1999    no meds    Review of Systems Constitutional: No fever or unexpected weight change Pulmonary: No pleurisy or hemoptysis Cardiovascular: No chest pain or palpitations     Objective:   Physical Exam BP 118/82  Pulse 99  Temp 98.2 F (36.8 C) (Oral)  Ht 5\' 3"  (1.6 m)  Wt 137 lb (62.143 kg)  BMI 24.27 kg/m2  SpO2 97% GEN: mildly ill appearing and audible head/chest congestion HENT: NCAT, no sinus tenderness bilaterally, nares with clear discharge, oropharynx mild erythema, no exudate Eyes: Vision grossly intact, no conjunctivitis Lungs: Clear to auscultation with raspy cough on inspiration, no rhonchi but exp wheeze, no increased work of breathing Cardiovascular: Regular rate and rhythm, no bilateral edema  Lab Results  Component Value Date   WBC 13.6* 05/29/2012   HGB 13.3 05/29/2012   HCT 40.3 05/29/2012   PLT 284 05/29/2012   GLUCOSE 125* 05/29/2012   CHOL 178 10/03/2009   TRIG 71.0 10/03/2009   HDL 70.50 10/03/2009   LDLCALC 93 10/03/2009   ALT 17 07/04/2011   AST 22 07/04/2011   NA 138 05/29/2012   K 3.9 05/29/2012   CL 101 05/29/2012   CREATININE 0.74 05/29/2012   BUN 13 05/29/2012   CO2 23 05/29/2012   TSH  14.87* 07/07/2012   INR 1.02 07/04/2011       Assessment & Plan:  Viral URI, resolved  Cough, bronchospasm   Explained lack of efficacy for antibiotics at this time Change prescription cough suppression, pred taper and Alb MDI for bronchospasm  - new prescriptions done Symptomatic care with Tylenol or Advil, hydration and rest -  salt gargle advised as needed

## 2012-07-13 NOTE — Patient Instructions (Signed)
It was good to see you today. If you develop worsening symptoms or fever, call and we can reconsider antibiotics, but it does not appear necessary to use antibiotics at this time. Prednisone taper, Albuterol inhaler and different prescription cough syrup - Your prescription(s) have been submitted to your pharmacy. Please take as directed and contact our office if you believe you are having problem(s) with the medication(s). Also can use tramadol as needed for cough symptoms  Alternate between ibuprofen and tylenol for aches, pain and fever symptoms as discussed Hydrate, rest and call if worse or unimproved Will check your B12 level next visit when we recheck your thyroid labs

## 2012-07-13 NOTE — Assessment & Plan Note (Signed)
Re-started B12 sl in 04/2012 - recheck next OV and consider resuming IM if needed Lab Results  Component Value Date   VITAMINB12 271 05/10/2012

## 2012-07-15 ENCOUNTER — Telehealth: Payer: Self-pay | Admitting: Internal Medicine

## 2012-07-15 MED ORDER — OSELTAMIVIR PHOSPHATE 75 MG PO CAPS: 75.0000 mg | ORAL_CAPSULE | Freq: Every day | ORAL | Status: DC

## 2012-07-15 NOTE — Telephone Encounter (Signed)
prophylactic Tamiflu - education and rx done

## 2012-08-03 ENCOUNTER — Ambulatory Visit (INDEPENDENT_AMBULATORY_CARE_PROVIDER_SITE_OTHER): Payer: 59 | Admitting: Internal Medicine

## 2012-08-03 ENCOUNTER — Encounter: Payer: Self-pay | Admitting: Internal Medicine

## 2012-08-03 VITALS — BP 110/80 | HR 122 | Temp 99.3°F

## 2012-08-03 DIAGNOSIS — J069 Acute upper respiratory infection, unspecified: Secondary | ICD-10-CM

## 2012-08-03 DIAGNOSIS — J111 Influenza due to unidentified influenza virus with other respiratory manifestations: Secondary | ICD-10-CM

## 2012-08-03 MED ORDER — OSELTAMIVIR PHOSPHATE 75 MG PO CAPS: 75.0000 mg | ORAL_CAPSULE | Freq: Two times a day (BID) | ORAL | Status: DC

## 2012-08-03 MED ORDER — HYDROCODONE-HOMATROPINE 5-1.5 MG/5ML PO SYRP: 5.0000 mL | ORAL_SOLUTION | Freq: Four times a day (QID) | ORAL | Status: DC | PRN

## 2012-08-03 NOTE — Progress Notes (Signed)
  Subjective:   HPI  complains of flu-like symptoms  Onset 24h ago, progressively worse symptoms  associated with fever, headache, myalgia and exhaustion Also mild nasal congestion, nausea without v/d, and cough Minimal relief with OTC meds Precipitated by sick contacts  Past Medical History  Diagnosis Date  . VITAMIN B12 DEFICIENCY 09/2009 dx  . Unspecified hypothyroidism 1982/2000    surgical resection of benign growth - partial then complete  . GERD (gastroesophageal reflux disease)     per endo. - does not take any meds.    Marland Kitchen MIGRAINE HEADACHE     last Migraine 06/29/2011-   . ANEMIA, IRON DEFICIENCY   . Kidney stones 1998, 2005    x 2  . Depression     therapy  . Fibromyalgia 1999    no meds    Review of Systems Constitutional: No unexpected weight change Pulmonary: No pleurisy or hemoptysis Cardiovascular: No chest pain or palpitations     Objective:   Physical Exam BP 110/80  Pulse 122  Temp 99.3 F (37.4 C) (Oral)  SpO2 93% GEN: mildly ill appearing, fatigued HENT: NCAT, no sinus tenderness bilaterally, nares with clear discharge, oropharynx mild erythema, no exudate Eyes: Vision grossly intact, no conjunctivitis Neck: shoddy anterior LAD, supple with FROM Lungs: Clear to auscultation without rhonchi or wheeze, no increased work of breathing Cardiovascular: Regular rate and rhythm, no bilateral edema  Lab Results  Component Value Date   WBC 13.6* 05/29/2012   HGB 13.3 05/29/2012   HCT 40.3 05/29/2012   PLT 284 05/29/2012   GLUCOSE 125* 05/29/2012   CHOL 178 10/03/2009   TRIG 71.0 10/03/2009   HDL 70.50 10/03/2009   LDLCALC 93 10/03/2009   ALT 17 07/04/2011   AST 22 07/04/2011   NA 138 05/29/2012   K 3.9 05/29/2012   CL 101 05/29/2012   CREATININE 0.74 05/29/2012   BUN 13 05/29/2012   CO2 23 05/29/2012   TSH 14.87* 07/07/2012   INR 1.02 07/04/2011      Assessment & Plan:   Viral URI - symptoms clinically consistent with influenza    Explained lack  of efficacy for traditional antibiotics in viral disease  Tamiflu prescribed + prescription cough suppression - new prescriptions done  Symptomatic care with Tylenol and/or Advil, hydration and rest - also OTC decongestants, antihistamines, and/or antitussives and salt gargle advised as needed  Pt education provided Work/school excuse note provided

## 2012-08-03 NOTE — Patient Instructions (Signed)
It was good to see you today. tamiflu antibiotics and prescription cough syrup - Your prescription(s) have been submitted to your pharmacy. Please take as directed and contact our office if you believe you are having problem(s) with the medication(s). Alternate between ibuprofen and tylenol for aches, pain and fever symptoms as discussed Hydrate, rest and call if worse or unimproved Work excuse note for today thru Thursday, return Friday or as scheduled as discussed

## 2012-08-16 ENCOUNTER — Ambulatory Visit: Payer: 59

## 2012-08-29 ENCOUNTER — Other Ambulatory Visit: Payer: Self-pay | Admitting: Internal Medicine

## 2012-09-02 ENCOUNTER — Ambulatory Visit: Payer: 59

## 2012-09-16 ENCOUNTER — Ambulatory Visit: Payer: 59

## 2012-10-18 ENCOUNTER — Ambulatory Visit
Admission: RE | Admit: 2012-10-18 | Discharge: 2012-10-18 | Disposition: A | Discharge status: Home / Self Care | Payer: 59 | Source: Ambulatory Visit | Attending: Internal Medicine | Admitting: Internal Medicine

## 2012-10-18 DIAGNOSIS — Z1239 Encounter for other screening for malignant neoplasm of breast: Secondary | ICD-10-CM

## 2012-10-19 LAB — HM PAP SMEAR

## 2012-10-28 ENCOUNTER — Telehealth: Payer: Self-pay | Admitting: Internal Medicine

## 2012-10-28 NOTE — Telephone Encounter (Signed)
Patient Information:  Caller Name: Shanin  Phone: [phone]  Patient: Melissa Kirby, Melissa Kirby  Gender: Female  DOB: 03-25-1961  Age: 52 Years  PCP: Rene Paci (Adults only)  Pregnant: No  Office Follow Up:  Does the office need to follow up with this patient?: No  Instructions For The Office: N/A   Symptoms  Reason For Call & Symptoms: "I think I have another sinus infection."  Cough onset 10/27/12 with green sputum.  Green' & bloody nasal secretions. Headache rated at 6-7 of 10;  Emergent symptoms ruled out.  See Today or Tomorrow in Office due to "Using nasal washes and pain medication > 24 hours and sinus pain persisits.  Reviewed Health History In EMR: Yes  Reviewed Medications In EMR: Yes  Reviewed Allergies In EMR: Yes  Reviewed Surgeries / Procedures: Yes  Date of Onset of Symptoms: 10/21/2012  Treatments Tried: Sudafed, Tylenol - Sore Throat improve symptoms.  Treatments Tried Worked: No OB / GYN:  LMP: Unknown  Guideline(s) Used:  Sinus Pain and Congestion  Disposition Per Guideline:   See Today or Tomorrow in Office  Reason For Disposition Reached:   Using nasal washes and pain medicine > 24 hours and sinus pain (lower forehead, cheekbone, or eye) persists  Advice Given:  Reassurance:   Sinus congestion is a normal part of a cold.  Usually home treatment with nasal washes can prevent an actual bacterial sinus infection.  Antibiotics are not helpful for the sinus congestion that occurs with colds.  For a Runny Nose With Profuse Discharge:  Blowing the nose is all that is needed.  If the skin around your nostrils gets irritated, apply a tiny amount of petroleum ointment to the nasal openings once or twice a day.  Medicines for a Stuffy or Runny Nose:  Most cold medicines that are available over-the-counter (OTC) are not helpful.  Antihistamines: Are only helpful if you also have nasal allergies.  If you have a very runny nose and you really think you need a medicine,  you can try using a nasal decongestant for a couple days.  Pain and Fever Medicines:  For pain or fever relief, take either acetaminophen or ibuprofen.  They are over-the-counter (OTC) drugs that help treat both fever and pain. You can buy them at the drugstore.  Treat fevers above 101 F (38.3 C). The goal of fever therapy is to bring the fever down to a comfortable level. Remember that fever medicine usually lowers fever 2 degrees F (1 - 1 1/2 degrees C).  Hydration:  Drink plenty of liquids (6-8 glasses of water daily). If the air in your home is dry, use a cool mist humidifier  Call Back If:   Severe pain lasts longer than 2 hours after pain medicine  You become worse.  Patient Will Follow Care Advice:  YES  Appointment Scheduled:  10/29/2012 14:00:00 Appointment Scheduled Provider:  Nicki Reaper

## 2012-10-29 ENCOUNTER — Encounter: Payer: Self-pay | Admitting: Internal Medicine

## 2012-10-29 ENCOUNTER — Ambulatory Visit (INDEPENDENT_AMBULATORY_CARE_PROVIDER_SITE_OTHER): Payer: 59 | Admitting: Internal Medicine

## 2012-10-29 VITALS — BP 120/82 | HR 101 | Temp 98.1°F | Ht 63.0 in | Wt 136.0 lb

## 2012-10-29 DIAGNOSIS — J019 Acute sinusitis, unspecified: Secondary | ICD-10-CM

## 2012-10-29 MED ORDER — LEVOFLOXACIN 500 MG PO TABS: 500.0000 mg | ORAL_TABLET | Freq: Every day | ORAL | Status: DC

## 2012-10-29 NOTE — Progress Notes (Signed)
HPI  Pt presents to the clinic today with c/o nasal congestion, headache, sore throat and cough x 8 days. She has been taking Mucinex and Sudafed as well as tylenol but the symptoms seem to be getting worse. She has no history of allergies or asthma. She has not had sick contacts. She does have history of sinus infection in the past and she reports this feels the same.  Review of Systems    Past Medical History  Diagnosis Date  . VITAMIN B12 DEFICIENCY 09/2009 dx  . Unspecified hypothyroidism 1982/2000    surgical resection of benign growth - partial then complete  . GERD (gastroesophageal reflux disease)     per endo. - does not take any meds.    Marland Kitchen MIGRAINE HEADACHE     last Migraine 06/29/2011-   . ANEMIA, IRON DEFICIENCY   . Kidney stones 1998, 2005    x 2  . Depression     therapy  . Fibromyalgia 1999    no meds    Family History  Problem Relation Age of Onset  . Hyperlipidemia Mother   . Heart disease Mother   . Hyperlipidemia Father   . Breast cancer Maternal Grandmother   . Hyperlipidemia Maternal Grandmother   . Ovarian cancer Other   . Anesthesia problems Neg Hx   . Malignant hyperthermia Neg Hx   . Pseudochol deficiency Neg Hx   . Hypotension Neg Hx     History   Social History  . Marital Status: Married    Spouse Name: N/A    Number of Children: N/A  . Years of Education: N/A   Occupational History  . Not on file.   Social History Main Topics  . Smoking status: Never Smoker   . Smokeless tobacco: Not on file     Comment: Married, lives with spouse. 3 children. 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 Alcohol Use: No  . Drug Use: No  . Sexually Active: Yes    Birth Control/ Protection: Surgical     Comment: Married lives with spouse, 3 children. 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 Not on file   Social History Narrative  . No narrative on file    Allergies  Allergen Reactions  . Compazine  Anaphylaxis    Hypotension  . Gluten Meal     Vitamin B irregulaties  . Metoclopramide Hcl Anaphylaxis    Hypotension  . Morphine And Related     nausea     Constitutional: Positive headache, fatigue and fever. Denies abrupt weight changes.  HEENT:  Positive eye pain, pressure behind the eyes, facial pain, nasal congestion and sore throat. Denies eye redness, ear pain, ringing in the ears, wax buildup, runny nose or bloody nose. Respiratory: Positive cough and thick green sputum production. Denies difficulty breathing or shortness of breath.  Cardiovascular: Denies chest pain, chest tightness, palpitations or swelling in the hands or feet.   No other specific complaints in a complete review of systems (except as listed in HPI above).  Objective:    BP 120/82  Pulse 101  Temp(Src) 98.1 F (36.7 C) (Oral)  Ht 5\' 3"  (1.6 m)  Wt 136 lb (61.689 kg)  BMI 24.1 kg/m2  SpO2 96% Wt Readings from Last 3 Encounters:  10/29/12 136 lb (61.689 kg)  07/13/12 137 lb (62.143 kg)  07/07/12 137 lb 6.4 oz (62.324 kg)    General: Appears her stated age, well  developed, well nourished in NAD. HEENT: Head: normal shape and size; Eyes: sclera white, no icterus, conjunctiva pink, PERRLA and EOMs intact; Ears: Tm's gray and intact, normal light reflex; Nose: mucosa pink and moist, septum midline; Throat/Mouth: + PND. Teeth present, mucosa pink and moist, no exudate noted, no lesions or ulcerations noted.  Neck: Mild cervical lymphadenopathy. Neck supple, trachea midline. No massses, lumps or thyromegaly present.  Cardiovascular: Normal rate and rhythm. S1,S2 noted.  No murmur, rubs or gallops noted. No JVD or BLE edema. No carotid bruits noted. Pulmonary/Chest: Normal effort and positive vesicular breath sounds. No respiratory distress. No wheezes, rales or ronchi noted.      Assessment & Plan:   Acute bacterial sinusitis  Can use a Neti Pot which can be purchased from your local drug  store. Flonase 2 sprays each nostril for 3 days and then as needed. Levaquin x 7 days   RTC as needed or if symptoms persist.

## 2012-10-29 NOTE — Patient Instructions (Signed)

## 2012-12-23 ENCOUNTER — Other Ambulatory Visit: Payer: Self-pay | Admitting: Internal Medicine

## 2013-01-12 ENCOUNTER — Other Ambulatory Visit (INDEPENDENT_AMBULATORY_CARE_PROVIDER_SITE_OTHER): Payer: 59

## 2013-01-12 ENCOUNTER — Encounter: Payer: Self-pay | Admitting: Internal Medicine

## 2013-01-12 ENCOUNTER — Ambulatory Visit (INDEPENDENT_AMBULATORY_CARE_PROVIDER_SITE_OTHER): Payer: 59 | Admitting: Internal Medicine

## 2013-01-12 VITALS — BP 120/82 | HR 107 | Temp 98.1°F | Wt 136.0 lb

## 2013-01-12 DIAGNOSIS — M7710 Lateral epicondylitis, unspecified elbow: Secondary | ICD-10-CM

## 2013-01-12 DIAGNOSIS — M7711 Lateral epicondylitis, right elbow: Secondary | ICD-10-CM

## 2013-01-12 DIAGNOSIS — E039 Hypothyroidism, unspecified: Secondary | ICD-10-CM

## 2013-01-12 DIAGNOSIS — N952 Postmenopausal atrophic vaginitis: Secondary | ICD-10-CM

## 2013-01-12 LAB — TSH: TSH: 0.36 u[IU]/mL (ref 0.35–5.50)

## 2013-01-12 MED ORDER — DICLOFENAC SODIUM 1 % TD GEL: 2.0000 g | Freq: Four times a day (QID) | TRANSDERMAL | Status: DC

## 2013-01-12 MED ORDER — OSPEMIFENE 60 MG PO TABS: 60.0000 mg | ORAL_TABLET | Freq: Every day | ORAL | Status: DC

## 2013-01-12 NOTE — Patient Instructions (Signed)
It was good to see you today. We have reviewed your interval medical history today Test(s) ordered today. Your results will be released to MyChart (or called to you) after review, usually within 72hours after test completion. If any changes need to be made, you will be notified at that same time. Medications reviewed, ok to try Osphenia and Voltarne gel as discussed Your prescription(s) have been submitted to your pharmacy. Please take as directed and contact our office if you believe you are having problem(s) with the medication(s). No other changes. Please schedule followup in 6 months to review symptoms and medications, call sooner if problems.

## 2013-01-12 NOTE — Assessment & Plan Note (Signed)
S/p total thyroidectomy 2000 Last labs and dose changes reviewed Recheck now and adjust as needed The current medical regimen is effective;  continue present plan and medications.   Lab Results  Component Value Date   TSH 14.87* 07/07/2012

## 2013-01-12 NOTE — Assessment & Plan Note (Signed)
No relief with topical premarin cream trial 09/2010 for dryness and dyspareunia symptoms  Ok to try Osphenia - rx done follow up gyn if continued problems

## 2013-01-12 NOTE — Progress Notes (Signed)
  Subjective:    Patient ID: Melissa Kirby, female    DOB: 06-23-1961, 52 y.o.   MRN: [id number]  HPI Patient presents for a 6 month follow up of chronic medical problems. Also noted a recent occurrence of "tennis elbow". States that after her 12 hour shifts she notes weakness and a dull irritation in her right elbow. Rest and ice tend to alleviate the symptoms, work and use aggravate it.  Also asked about vaginal dryness and painful sexual intercourse. Has used an estrogen based cream in the past and did not like it. Saw a commercial about an oral medication and wanted to discuss at today's visit. Reports vaginal dryness and discomfort regularly and would like something to help make sex less painful. Patient is post menopausal  Sleep Reports no changes since her last visit. Taking nortriptyline to aid sleep which works with acceptable results. Notices more with night shifts. Patient states that she is not concerned.   Hypothyroid Reports no tachycardia, bradycardia, diarrhea, constipation, heat or cold intolerance or increase in diaphoresis. No side effects from change in medication dose.    Review of Systems     Objective:   Physical Exam        Assessment & Plan:

## 2013-01-12 NOTE — Progress Notes (Signed)
  Subjective:    Patient ID: Melissa Kirby, female    DOB: 1961/06/11, 52 y.o.   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 VITAMIN B12 DEFICIENCY 09/2009 dx  . Unspecified hypothyroidism 1982/2000    surgical resection of benign growth - partial then complete  . GERD (gastroesophageal reflux disease)     per endo. - does not take any meds.    Marland Kitchen MIGRAINE HEADACHE     last Migraine 06/29/2011-   . ANEMIA, IRON DEFICIENCY   . Kidney stones 1998, 2005    x 2  . Depression     therapy  . Fibromyalgia 1999    no meds    Review of Systems  Constitutional: Positive for fatigue. Negative for fever and unexpected weight change.  Cardiovascular: Negative for chest pain.  Musculoskeletal: Negative for back pain, joint swelling and gait problem.  Psychiatric/Behavioral: Positive for sleep disturbance (chronic). Negative for self-injury and dysphoric mood.      Objective:   Physical Exam  BP 120/82  Pulse 107  Temp(Src) 98.1 F (36.7 C) (Oral)  Wt 136 lb (61.689 kg)  BMI 24.1 kg/m2  SpO2 98% Wt Readings from Last 3 Encounters:  01/12/13 136 lb (61.689 kg)  10/29/12 136 lb (61.689 kg)  07/13/12 137 lb (62.143 kg)   Constitutional: She is appears well-developed and well-nourished. No distress, but fatigued appearing.  Neck: Normal range of motion, supple. No JVD present. No mass, thyroidectomy scar well healed Cardiovascular: Normal rate, regular rhythm and normal heart sounds.  No murmur heard. No BLE edema MSkel: R Elbow: tender to palpation over lateral epicondyle. Pain with resistance to ECRB extension, wrist extension and supination. FROM, no effusion, redness or swelling. Neurovascularly intact. No open  wounds. Pulmonary/Chest: Effort normal and breath sounds with b rhonchi. No respiratory distress. She has no wheezes.    Lab Results  Component Value Date   WBC 13.6* 05/29/2012   HGB 13.3 05/29/2012   HCT 40.3 05/29/2012   PLT 284 05/29/2012   CHOL 178 10/03/2009   TRIG 71.0 10/03/2009   HDL 70.50 10/03/2009   ALT 17 07/04/2011   AST 22 07/04/2011   NA 138 05/29/2012   K 3.9 05/29/2012   CL 101 05/29/2012   CREATININE 0.74 05/29/2012   BUN 13 05/29/2012   CO2 23 05/29/2012   TSH 14.87* 07/07/2012   INR 1.02 07/04/2011      Assessment & Plan:   See problem list. Medications and labs reviewed today.  r lateral epicondylitis - Diagnosis clarified, reassurance provided. Education provided. Continue ice, avoid overuse as much as possible and Voltaren gel twice a day to 4 times a day - erx done

## 2013-04-05 ENCOUNTER — Other Ambulatory Visit: Payer: Self-pay | Admitting: Internal Medicine

## 2013-05-06 ENCOUNTER — Ambulatory Visit (INDEPENDENT_AMBULATORY_CARE_PROVIDER_SITE_OTHER): Payer: 59 | Admitting: Internal Medicine

## 2013-05-06 ENCOUNTER — Encounter: Payer: Self-pay | Admitting: Internal Medicine

## 2013-05-06 VITALS — BP 112/84 | HR 99 | Temp 98.0°F | Ht 63.0 in | Wt 134.0 lb

## 2013-05-06 DIAGNOSIS — M797 Fibromyalgia: Secondary | ICD-10-CM

## 2013-05-06 DIAGNOSIS — IMO0001 Reserved for inherently not codable concepts without codable children: Secondary | ICD-10-CM

## 2013-05-06 DIAGNOSIS — J0100 Acute maxillary sinusitis, unspecified: Secondary | ICD-10-CM

## 2013-05-06 MED ORDER — FLUTICASONE PROPIONATE 50 MCG/ACT NA SUSP: 1.0000 | Freq: Every day | NASAL | Status: DC

## 2013-05-06 MED ORDER — PREDNISONE (PAK) 10 MG PO TABS: 10.0000 mg | ORAL_TABLET | ORAL | Status: DC

## 2013-05-06 MED ORDER — NORTRIPTYLINE HCL 50 MG PO CAPS: 100.0000 mg | ORAL_CAPSULE | Freq: Every day | ORAL | Status: DC

## 2013-05-06 MED ORDER — LEVOFLOXACIN 500 MG PO TABS: 500.0000 mg | ORAL_TABLET | Freq: Every day | ORAL | Status: DC

## 2013-05-06 NOTE — Assessment & Plan Note (Signed)
s/p 01/20/12 appt with rheum PA > Referred to pain mgmt for same RLE improved s/p ESI 02/2012 increased pamelor (also for depression) 01/08/12, increase now 04/2013 to help with insomnia Continue other meds as rx'd: meloxicam, gabapentin, tramadol

## 2013-05-06 NOTE — Patient Instructions (Signed)
It was good to see you today.  We have reviewed your prior records including labs and tests today  Levaquin antibiotics and prednisone taper -  Stat nasal steroid spray everyday, even after infection gone Increase "pamelor" to 100 mg nightly Your prescription(s) have been submitted to your pharmacy. Please take as directed and contact our office if you believe you are having problem(s) with the medication(s).  Alternate between ibuprofen and tylenol for aches, pain and fever symptoms as discussed  Hydrate, rest and call if worse or unimproved

## 2013-05-06 NOTE — Progress Notes (Signed)
  Subjective:    HPI  complains of head cold symptoms, ?sinusitus Onset >1 week ago, initially improved then relapsing and worse symptoms  First associated with rhinorrhea, sneezing, sore throat, mild headache and low grade fever Now sinus pressure and mild-mod nasal congestion, Bloody tinged green discharge No relief with OTC meds Precipitated by sick contacts and weather change  Past Medical History  Diagnosis Date  . VITAMIN B12 DEFICIENCY 09/2009 dx  . Unspecified hypothyroidism 1982/2000    surgical resection of benign growth - partial then complete  . GERD (gastroesophageal reflux disease)     per endo. - does not take any meds.    Marland Kitchen MIGRAINE HEADACHE     last Migraine 06/29/2011-   . ANEMIA, IRON DEFICIENCY   . Kidney stones 1998, 2005    x 2  . Depression     therapy  . Fibromyalgia 1999    no meds    Review of Systems Constitutional: No night sweats, no unexpected weight change Pulmonary: No pleurisy or hemoptysis Cardiovascular: No chest pain or palpitations     Objective:   Physical Exam BP 112/84  Pulse 99  Temp(Src) 98 F (36.7 C) (Oral)  Ht 5\' 3"  (1.6 m)  Wt 134 lb 0.1 oz (60.784 kg)  BMI 23.74 kg/m2  SpO2 98% GEN: mildly ill appearing and audible head/chest congestion HENT: NCAT, mild sinus tenderness bilaterally, nares with thick discharge and turbinate swelling, oropharynx mild erythema and PND, no exudate Eyes: Vision grossly intact, no conjunctivitis Lungs: Clear to auscultation without rhonchi or wheeze, no increased work of breathing Cardiovascular: Regular rate and rhythm, no bilateral edema  Lab Results  Component Value Date   WBC 13.6* 05/29/2012   HGB 13.3 05/29/2012   HCT 40.3 05/29/2012   PLT 284 05/29/2012   GLUCOSE 125* 05/29/2012   CHOL 178 10/03/2009   TRIG 71.0 10/03/2009   HDL 70.50 10/03/2009   LDLCALC 93 10/03/2009   ALT 17 07/04/2011   AST 22 07/04/2011   NA 138 05/29/2012   K 3.9 05/29/2012   CL 101 05/29/2012   CREATININE  0.74 05/29/2012   BUN 13 05/29/2012   CO2 23 05/29/2012   TSH 0.36 01/12/2013   INR 1.02 07/04/2011      Assessment & Plan:   acute sinusitis Cough, postnasal drip related to above   Empiric antibiotics prescribed due to symptom duration greater than 7 days and progression despite OTC symptomatic care Also prednisone taper x6 days for inflammation to treat congestion Also start nasal steroid for chronic allergic component new prescriptions done  Symptomatic care with Tylenol or Advil, decongestants, antihistamine, hydration and rest -   Saline irrigation and salt gargle advised as needed

## 2013-06-23 ENCOUNTER — Other Ambulatory Visit: Payer: Self-pay | Admitting: Internal Medicine

## 2013-06-29 ENCOUNTER — Encounter: Payer: Self-pay | Admitting: Internal Medicine

## 2013-06-29 ENCOUNTER — Other Ambulatory Visit (INDEPENDENT_AMBULATORY_CARE_PROVIDER_SITE_OTHER): Payer: 59

## 2013-06-29 ENCOUNTER — Ambulatory Visit (INDEPENDENT_AMBULATORY_CARE_PROVIDER_SITE_OTHER): Payer: 59 | Admitting: Internal Medicine

## 2013-06-29 VITALS — BP 110/88 | HR 100 | Temp 98.6°F | Wt 141.2 lb

## 2013-06-29 DIAGNOSIS — M62830 Muscle spasm of back: Secondary | ICD-10-CM

## 2013-06-29 DIAGNOSIS — Z0000 Encounter for general adult medical examination without abnormal findings: Secondary | ICD-10-CM

## 2013-06-29 DIAGNOSIS — M545 Low back pain, unspecified: Secondary | ICD-10-CM

## 2013-06-29 DIAGNOSIS — M5380 Other specified dorsopathies, site unspecified: Secondary | ICD-10-CM

## 2013-06-29 DIAGNOSIS — E039 Hypothyroidism, unspecified: Secondary | ICD-10-CM

## 2013-06-29 LAB — CBC WITH DIFFERENTIAL/PLATELET
Basophils Absolute: 0 10*3/uL (ref 0.0–0.1)
Basophils Relative: 0.6 % (ref 0.0–3.0)
Eosinophils Absolute: 0.2 10*3/uL (ref 0.0–0.7)
Eosinophils Relative: 2.7 % (ref 0.0–5.0)
HCT: 35.3 % — ABNORMAL LOW (ref 36.0–46.0)
Hemoglobin: 11.9 g/dL — ABNORMAL LOW (ref 12.0–15.0)
Lymphocytes Relative: 37 % (ref 12.0–46.0)
Lymphs Abs: 2.4 10*3/uL (ref 0.7–4.0)
MCHC: 33.7 g/dL (ref 30.0–36.0)
MCV: 87.1 fl (ref 78.0–100.0)
Monocytes Absolute: 0.8 10*3/uL (ref 0.1–1.0)
Monocytes Relative: 12.3 % — ABNORMAL HIGH (ref 3.0–12.0)
Neutro Abs: 3 10*3/uL (ref 1.4–7.7)
Neutrophils Relative %: 47.4 % (ref 43.0–77.0)
Platelets: 260 10*3/uL (ref 150.0–400.0)
RBC: 4.05 Mil/uL (ref 3.87–5.11)
RDW: 13.7 % (ref 11.5–14.6)
WBC: 6.4 10*3/uL (ref 4.5–10.5)

## 2013-06-29 LAB — HEPATIC FUNCTION PANEL
ALT: 16 U/L (ref 0–35)
AST: 20 U/L (ref 0–37)
Albumin: 4 g/dL (ref 3.5–5.2)
Alkaline Phosphatase: 133 U/L — ABNORMAL HIGH (ref 39–117)
Bilirubin, Direct: 0 mg/dL (ref 0.0–0.3)
Total Bilirubin: 0.4 mg/dL (ref 0.3–1.2)
Total Protein: 7.9 g/dL (ref 6.0–8.3)

## 2013-06-29 LAB — BASIC METABOLIC PANEL
BUN: 7 mg/dL (ref 6–23)
CO2: 26 mEq/L (ref 19–32)
Calcium: 9.3 mg/dL (ref 8.4–10.5)
Chloride: 104 mEq/L (ref 96–112)
Creatinine, Ser: 0.9 mg/dL (ref 0.4–1.2)
GFR: 73.87 mL/min (ref >60.00)
Glucose, Bld: 79 mg/dL (ref 70–99)
Potassium: 3.9 mEq/L (ref 3.5–5.1)
Sodium: 138 mEq/L (ref 135–145)

## 2013-06-29 LAB — LIPID PANEL
Cholesterol: 214 mg/dL — ABNORMAL HIGH (ref 0–200)
HDL: 55.8 mg/dL (ref >39.00)
Total CHOL/HDL Ratio: 4
Triglycerides: 118 mg/dL (ref 0.0–149.0)
VLDL: 23.6 mg/dL (ref 0.0–40.0)

## 2013-06-29 LAB — URINALYSIS, ROUTINE W REFLEX MICROSCOPIC
Bilirubin Urine: NEGATIVE
Hgb urine dipstick: NEGATIVE
Ketones, ur: NEGATIVE
Leukocytes, UA: NEGATIVE
Nitrite: NEGATIVE
RBC / HPF: NONE SEEN (ref 0–?)
Specific Gravity, Urine: 1.01 (ref 1.000–1.030)
Total Protein, Urine: NEGATIVE
Urine Glucose: NEGATIVE
Urobilinogen, UA: 0.2 (ref 0.0–1.0)
pH: 6 (ref 5.0–8.0)

## 2013-06-29 LAB — TSH: TSH: 0.26 u[IU]/mL — ABNORMAL LOW (ref 0.35–5.50)

## 2013-06-29 LAB — LDL CHOLESTEROL, DIRECT: Direct LDL: 145.1 mg/dL

## 2013-06-29 MED ORDER — METHOCARBAMOL 750 MG PO TABS: 750.0000 mg | ORAL_TABLET | Freq: Three times a day (TID) | ORAL | Status: DC | PRN

## 2013-06-29 MED ORDER — TRAMADOL HCL 50 MG PO TABS: 50.0000 mg | ORAL_TABLET | Freq: Two times a day (BID) | ORAL | Status: DC | PRN

## 2013-06-29 MED ORDER — PREDNISONE (PAK) 10 MG PO TABS: ORAL_TABLET | ORAL | Status: DC

## 2013-06-29 NOTE — Patient Instructions (Addendum)
It was good to see you today.  We have reviewed your prior records including labs and tests today  Health Maintenance reviewed - all recommended immunizations and age-appropriate screenings are up-to-date.  Medications reviewed and updated,His prednisone taper over next 6 days for inflammation and muscle relaxer methocarbamol as needed Your prescription(s) have been submitted to your pharmacy. Please take as directed and contact our office if you believe you are having problem(s) with the medication(s). Refill on tramadol to use as needed for pain  Test(s) ordered today. Your results will be released to MyChart (or called to you) after review, usually within 72hours after test completion. If any changes need to be made, you will be notified at that same time.  Please schedule followup in 6 months, call sooner if problems.  Health Maintenance, Female A healthy lifestyle and preventative care can promote health and wellness.  Maintain regular health, dental, and eye exams.  Eat a healthy diet. Foods like vegetables, fruits, whole grains, low-fat dairy products, and lean protein foods contain the nutrients you need without too many calories. Decrease your intake of foods high in solid fats, added sugars, and salt. Get information about a proper diet from your caregiver, if necessary.  Regular physical exercise is one of the most important things you can do for your health. Most adults should get at least 150 minutes of moderate-intensity exercise (any activity that increases your heart rate and causes you to sweat) each week. In addition, most adults need muscle-strengthening exercises on 2 or more days a week.   Maintain a healthy weight. The body mass index (BMI) is a screening tool to identify possible weight problems. It provides an estimate of body fat based on height and weight. Your caregiver can help determine your BMI, and can help you achieve or maintain a healthy weight. For adults 20  years and older:  A BMI below 18.5 is considered underweight.  A BMI of 18.5 to 24.9 is normal.  A BMI of 25 to 29.9 is considered overweight.  A BMI of 30 and above is considered obese.  Maintain normal blood lipids and cholesterol by exercising and minimizing your intake of saturated fat. Eat a balanced diet with plenty of fruits and vegetables. Blood tests for lipids and cholesterol should begin at age 51 and be repeated every 5 years. If your lipid or cholesterol levels are high, you are over 50, or you are a high risk for heart disease, you may need your cholesterol levels checked more frequently.Ongoing high lipid and cholesterol levels should be treated with medicines if diet and exercise are not effective.  If you smoke, find out from your caregiver how to quit. If you do not use tobacco, do not start.  Lung cancer screening is recommended for adults aged 30 80 years who are at high risk for developing lung cancer because of a history of smoking. Yearly low-dose computed tomography (CT) is recommended for people who have at least a 30-pack-year history of smoking and are a current smoker or have quit within the past 15 years. A pack year of smoking is smoking an average of 1 pack of cigarettes a day for 1 year (for example: 1 pack a day for 30 years or 2 packs a day for 15 years). Yearly screening should continue until the smoker has stopped smoking for at least 15 years. Yearly screening should also be stopped for people who develop a health problem that would prevent them from having lung cancer treatment.  If you are pregnant, do not drink alcohol. If you are breastfeeding, be very cautious about drinking alcohol. If you are not pregnant and choose to drink alcohol, do not exceed 1 drink per day. One drink is considered to be 12 ounces (355 mL) of beer, 5 ounces (148 mL) of wine, or 1.5 ounces (44 mL) of liquor.  Avoid use of street drugs. Do not share needles with anyone. Ask for help  if you need support or instructions about stopping the use of drugs.  High blood pressure causes heart disease and increases the risk of stroke. Blood pressure should be checked at least every 1 to 2 years. Ongoing high blood pressure should be treated with medicines, if weight loss and exercise are not effective.  If you are 25 to 51 years old, ask your caregiver if you should take aspirin to prevent strokes.  Diabetes screening involves taking a blood sample to check your fasting blood sugar level. This should be done once every 3 years, after age 55, if you are within normal weight and without risk factors for diabetes. Testing should be considered at a younger age or be carried out more frequently if you are overweight and have at least 1 risk factor for diabetes.  Breast cancer screening is essential preventative care for women. You should practice "breast self-awareness." This means understanding the normal appearance and feel of your breasts and may include breast self-examination. Any changes detected, no matter how small, should be reported to a caregiver. Women in their 39s and 30s should have a clinical breast exam (CBE) by a caregiver as part of a regular health exam every 1 to 3 years. After age 45, women should have a CBE every year. Starting at age 57, women should consider having a mammogram (breast X-ray) every year. Women who have a family history of breast cancer should talk to their caregiver about genetic screening. Women at a high risk of breast cancer should talk to their caregiver about having an MRI and a mammogram every year.  Breast cancer gene (BRCA)-related cancer risk assessment is recommended for women who have family members with BRCA-related cancers. BRCA-related cancers include breast, ovarian, tubal, and peritoneal cancers. Having family members with these cancers may be associated with an increased risk for harmful changes (mutations) in the breast cancer genes BRCA1 and  BRCA2. Results of the assessment will determine the need for genetic counseling and BRCA1 and BRCA2 testing.  The Pap test is a screening test for cervical cancer. Women should have a Pap test starting at age 57. Between ages 18 and 25, Pap tests should be repeated every 2 years. Beginning at age 65, you should have a Pap test every 3 years as long as the past 3 Pap tests have been normal. If you had a hysterectomy for a problem that was not cancer or a condition that could lead to cancer, then you no longer need Pap tests. If you are between ages 57 and 28, and you have had normal Pap tests going back 10 years, you no longer need Pap tests. If you have had past treatment for cervical cancer or a condition that could lead to cancer, you need Pap tests and screening for cancer for at least 20 years after your treatment. If Pap tests have been discontinued, risk factors (such as a new sexual partner) need to be reassessed to determine if screening should be resumed. Some women have medical problems that increase the chance of getting cervical  cancer. In these cases, your caregiver may recommend more frequent screening and Pap tests.  The human papillomavirus (HPV) test is an additional test that may be used for cervical cancer screening. The HPV test looks for the virus that can cause the cell changes on the cervix. The cells collected during the Pap test can be tested for HPV. The HPV test could be used to screen women aged 41 years and older, and should be used in women of any age who have unclear Pap test results. After the age of 32, women should have HPV testing at the same frequency as a Pap test.  Colorectal cancer can be detected and often prevented. Most routine colorectal cancer screening begins at the age of 41 and continues through age 64. However, your caregiver may recommend screening at an earlier age if you have risk factors for colon cancer. On a yearly basis, your caregiver may provide home  test kits to check for hidden blood in the stool. Use of a small camera at the end of a tube, to directly examine the colon (sigmoidoscopy or colonoscopy), can detect the earliest forms of colorectal cancer. Talk to your caregiver about this at age 6, when routine screening begins. Direct examination of the colon should be repeated every 5 to 10 years through age 44, unless early forms of pre-cancerous polyps or small growths are found.  Hepatitis C blood testing is recommended for all people born from [birth year] through 1964 and any individual with known risks for hepatitis C.  Practice safe sex. Use condoms and avoid high-risk sexual practices to reduce the spread of sexually transmitted infections (STIs). Sexually active women aged 13 and younger should be checked for Chlamydia, which is a common sexually transmitted infection. Older women with new or multiple partners should also be tested for Chlamydia. Testing for other STIs is recommended if you are sexually active and at increased risk.  Osteoporosis is a disease in which the bones lose minerals and strength with aging. This can result in serious bone fractures. The risk of osteoporosis can be identified using a bone density scan. Women ages 12 and over and women at risk for fractures or osteoporosis should discuss screening with their caregivers. Ask your caregiver whether you should be taking a calcium supplement or vitamin D to reduce the rate of osteoporosis.  Menopause can be associated with physical symptoms and risks. Hormone replacement therapy is available to decrease symptoms and risks. You should talk to your caregiver about whether hormone replacement therapy is right for you.  Use sunscreen. Apply sunscreen liberally and repeatedly throughout the day. You should seek shade when your shadow is shorter than you. Protect yourself by wearing long sleeves, pants, a wide-brimmed hat, and sunglasses year round, whenever you are outdoors.  Notify  your caregiver of new moles or changes in moles, especially if there is a change in shape or color. Also notify your caregiver if a mole is larger than the size of a pencil eraser.  Stay current with your immunizations. Document Released: 01/20/2011 Document Revised: 11/01/2012 Document Reviewed: 01/20/2011 [facility] Patient Information 2014 [place], [place]. Back Exercises Back exercises help treat and prevent back injuries. The goal is to increase your strength in your belly (abdominal) and back muscles. These exercises can also help with flexibility. Start these exercises when told by your doctor. HOME CARE Back exercises include: Pelvic Tilt.  Lie on your back with your knees bent. Tilt your pelvis until the lower part of your back  is against the floor. Hold this position 5 to 10 sec. Repeat this exercise 5 to 10 times. Knee to Chest.  Pull 1 knee up against your chest and hold for 20 to 30 seconds. Repeat this with the other knee. This may be done with the other leg straight or bent, whichever feels better. Then, pull both knees up against your chest. Sit-Ups or Curl-Ups.  Bend your knees 90 degrees. Start with tilting your pelvis, and do a partial, slow sit-up. Only lift your upper half 30 to 45 degrees off the floor. Take at least 2 to 3 seonds for each sit-up. Do not do sit-ups with your knees out straight. If partial sit-ups are difficult, simply do the above but with only tightening your belly (abdominal) muscles and holding it as told. Hip-Lift.  Lie on your back with your knees flexed 90 degrees. Push down with your feet and shoulders as you raise your hips 2 inches off the floor. Hold for 10 seconds, repeat 5 to 10 times. Back Arches.  Lie on your stomach. Prop yourself up on bent elbows. Slowly press on your hands, causing an arch in your low back. Repeat 3 to 5 times. Shoulder-Lifts.  Lie face down with arms beside your body. Keep hips and belly pressed to floor as you slowly  lift your head and shoulders off the floor. Do not overdo your exercises. Be careful in the beginning. Exercises may cause you some mild back discomfort. If the pain lasts for more than 15 minutes, stop the exercises until you see your doctor. Improvement with exercise for back problems is slow.  Document Released: 08/09/2010 Document Revised: 09/29/2011 Document Reviewed: 05/08/2011 [facility] Patient Information 2014 [place], [place].

## 2013-06-29 NOTE — Progress Notes (Signed)
Pre-visit discussion using our clinic review tool. No additional management support is needed unless otherwise documented below in the visit note.  

## 2013-06-29 NOTE — Progress Notes (Signed)
Subjective:    Patient ID: Melissa Kirby, female    DOB: [birth year], 52 y.o.   MRN: [id number]  Back Pain This is a recurrent problem. The current episode started in the past 7 days. The problem occurs constantly. The pain is present in the lumbar spine (r upper back and R lower back). The quality of the pain is described as aching. Radiates to: right calf. The pain is moderate. The pain is the same all the time. The symptoms are aggravated by twisting, bending and position. Stiffness is present all day. Associated symptoms include leg pain. Pertinent negatives include no abdominal pain, bladder incontinence, bowel incontinence, chest pain, fever, numbness, paresis, pelvic pain, perianal numbness, tingling, weakness or weight loss. She has tried bed rest, heat, ice and analgesics for the symptoms. The treatment provided mild relief.   Also patient is here today for annual physical.    Past Medical History  Diagnosis Date  . VITAMIN B12 DEFICIENCY 09/2009 dx  . Unspecified hypothyroidism 1982/2000    surgical resection of benign growth - partial then complete  . GERD (gastroesophageal reflux disease)     per endo. - does not take any meds.    Marland Kitchen MIGRAINE HEADACHE     last Migraine 06/29/2011-   . ANEMIA, IRON DEFICIENCY   . Kidney stones 1998, 2005    x 2  . Depression     therapy  . Fibromyalgia 1999    no meds   Family History  Problem Relation Age of Onset  . Hyperlipidemia Mother   . Heart disease Mother   . Hyperlipidemia Father   . Breast cancer Maternal Grandmother   . Hyperlipidemia Maternal Grandmother   . Ovarian cancer Other   . Anesthesia problems Neg Hx   . Malignant hyperthermia Neg Hx   . Pseudochol deficiency Neg Hx   . Hypotension Neg Hx    History  Substance Use Topics  . Smoking status: Never Smoker   . Smokeless tobacco: Not on file     Comment: Married, lives with spouse. 3 children. 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 Alcohol Use: No     Review of Systems  Constitutional: Positive for fatigue. Negative for fever, weight loss and unexpected weight change.  Cardiovascular: Negative for chest pain.  Gastrointestinal: Negative for abdominal pain and bowel incontinence.  Genitourinary: Negative for bladder incontinence and pelvic pain.  Musculoskeletal: Positive for back pain. Negative for gait problem and joint swelling.  Neurological: Negative for tingling, weakness and numbness.  Psychiatric/Behavioral: Positive for sleep disturbance (chronic). Negative for self-injury and dysphoric mood.  All other systems reviewed and are negative.      Objective:   Physical Exam BP 110/88  Pulse 100  Temp(Src) 98.6 F (37 C) (Oral)  Wt 141 lb 3.2 oz (64.048 kg)  SpO2 97% Wt Readings from Last 3 Encounters:  06/29/13 141 lb 3.2 oz (64.048 kg)  05/06/13 134 lb 0.1 oz (60.784 kg)  01/12/13 136 lb (61.689 kg)   Constitutional: She is appears well-developed and well-nourished. No distress, but fatigued appearing.  Neck: Normal range of motion, supple. No JVD present. No mass, thyroidectomy scar well healed Cardiovascular: Normal rate, regular rhythm and normal heart sounds.  No murmur heard. No BLE edema Pulmonary/Chest: Effort normal and breath sounds with b rhonchi. No respiratory distress. She has no wheezes.  Musculoskeletal: Back: full range of motion of thoracic and lumbar spine. Non tender to palpation. Spas R scapula region -Negative straight  leg raise. DTR's are symmetrically intact. Sensation intact in all dermatomes of the lower extremities. Full strength to manual muscle testing. patient is able to heel toe walk without difficulty and ambulates with antalgic gait.   Lab Results  Component Value Date   WBC 13.6* 05/29/2012   HGB 13.3 05/29/2012   HCT 40.3 05/29/2012   PLT 284 05/29/2012   CHOL 178 10/03/2009   TRIG 71.0 10/03/2009   HDL 70.50 10/03/2009   ALT 17 07/04/2011   AST 22 07/04/2011   NA 138 05/29/2012   K  3.9 05/29/2012   CL 101 05/29/2012   CREATININE 0.74 05/29/2012   BUN 13 05/29/2012   CO2 23 05/29/2012   TSH 0.36 01/12/2013   INR 1.02 07/04/2011      Assessment & Plan:   CPX/v70.0 - Patient has been counseled on age-appropriate routine health concerns for screening and prevention. These are reviewed and up-to-date. Immunizations are up-to-date or declined. Labs ordered and reviewed.   Back spasm - ?right radiculopathy No injury or fall but suspect related to overuse (cleaning out closet 1 week ago)  Tx antiinflammatory - pred pak and muscle relaxer  Back exercises provided  Pt agrees to call if unimproved in next 6 weeks, sooner if worse   Also see problem list. Medications and labs reviewed today.

## 2013-06-29 NOTE — Assessment & Plan Note (Signed)
S/p total thyroidectomy 2000 Last labs and dose changes reviewed Recheck now and adjust as needed The current medical regimen is effective;  continue present plan and medications.   Lab Results  Component Value Date   TSH 0.36 01/12/2013

## 2013-07-06 ENCOUNTER — Ambulatory Visit: Payer: 59 | Admitting: Internal Medicine

## 2013-08-16 ENCOUNTER — Encounter: Payer: Self-pay | Admitting: Internal Medicine

## 2013-08-16 ENCOUNTER — Ambulatory Visit (INDEPENDENT_AMBULATORY_CARE_PROVIDER_SITE_OTHER): Payer: 59 | Admitting: Internal Medicine

## 2013-08-16 VITALS — BP 112/82 | HR 111 | Temp 98.7°F | Wt 140.6 lb

## 2013-08-16 DIAGNOSIS — F3289 Other specified depressive episodes: Secondary | ICD-10-CM

## 2013-08-16 DIAGNOSIS — F32A Depression, unspecified: Secondary | ICD-10-CM

## 2013-08-16 DIAGNOSIS — B9789 Other viral agents as the cause of diseases classified elsewhere: Secondary | ICD-10-CM

## 2013-08-16 DIAGNOSIS — F329 Major depressive disorder, single episode, unspecified: Secondary | ICD-10-CM

## 2013-08-16 DIAGNOSIS — J329 Chronic sinusitis, unspecified: Secondary | ICD-10-CM

## 2013-08-16 MED ORDER — PHENYLEPH-PROMETHAZINE-COD 5-6.25-10 MG/5ML PO SYRP: 5.0000 mL | ORAL_SOLUTION | ORAL | Status: DC | PRN

## 2013-08-16 MED ORDER — VENLAFAXINE HCL ER 37.5 MG PO CP24: 37.5000 mg | ORAL_CAPSULE | Freq: Every day | ORAL | Status: DC

## 2013-08-16 NOTE — Assessment & Plan Note (Signed)
Increase symptoms following unexpected death of dad 06/2013 (thanksgiving day) Resume SNRI as previously helpful in 2012 (venlafex 37.5) Refer back to counseling with Judithe ModestSusan Bond increased pamelor 01/08/12 and 04/2013 to help with insomnia, but ineffective/poorly tolerated - resumed 75mg  qhs indep - continue same

## 2013-08-16 NOTE — Progress Notes (Signed)
  Subjective:   HPI  complains of head cold symptoms, ?sinusitus Onset 72h ago, gradually worsening symptoms  First associated with rhinorrhea, sneezing, sore throat, mild headache and low grade fever Now sinus pressure and mild-mod nasal congestion, Bloody tinged green discharge No relief with OTC meds and rx flonase Precipitated by sick contacts and weather change  Also concerned about recurrent depression symptoms. Increase following unexpected death of father November 2014  Past Medical History  Diagnosis Date  . VITAMIN B12 DEFICIENCY 09/2009 dx  . Unspecified hypothyroidism 1982/2000    surgical resection of benign growth - partial then complete  . GERD (gastroesophageal reflux disease)     per endo. - does not take any meds.    Marland Kitchen. MIGRAINE HEADACHE     last Migraine 06/29/2011-   . ANEMIA, IRON DEFICIENCY   . Kidney stones 1998, 2005    x 2  . Depression     therapy  . Fibromyalgia 1999    no meds    Review of Systems Constitutional: No night sweats, no unexpected weight change Pulmonary: No pleurisy or hemoptysis Cardiovascular: No chest pain or palpitations     Objective:   Physical Exam BP 112/82  Pulse 111  Temp(Src) 98.7 F (37.1 C) (Oral)  Wt 140 lb 9.6 oz (63.776 kg)  SpO2 97% GEN: mildly ill appearing and audible head/chest congestion HENT: NCAT, mild sinus tenderness bilaterally, nares with thick discharge and turbinate swelling, oropharynx mild erythema and PND, no exudate Eyes: Vision grossly intact, no conjunctivitis Lungs: Clear to auscultation without rhonchi or wheeze, no increased work of breathing Cardiovascular: Regular rate and rhythm, no bilateral edema Psychiatric: dysphoric mood and affect, occasionally tearful preferring to father's illness and death. Judgment and insight good/normal  Lab Results  Component Value Date   WBC 6.4 06/29/2013   HGB 11.9* 06/29/2013   HCT 35.3* 06/29/2013   PLT 260.0 06/29/2013   GLUCOSE 79 06/29/2013   CHOL 214* 06/29/2013   TRIG 118.0 06/29/2013   HDL 55.80 06/29/2013   LDLDIRECT 145.1 06/29/2013   LDLCALC 93 10/03/2009   ALT 16 06/29/2013   AST 20 06/29/2013   NA 138 06/29/2013   K 3.9 06/29/2013   CL 104 06/29/2013   CREATININE 0.9 06/29/2013   BUN 7 06/29/2013   CO2 26 06/29/2013   TSH 0.26* 06/29/2013   INR 1.02 07/04/2011      Assessment & Plan:   acute sinusitis, viral in nature Cough, postnasal drip related to above   Advised on inefficacy of antibiotics for viral disease, but will call if symptoms unimproved after 7 days, sooner if worse  continue nasal steroid for chronic allergic component  Prescription Promethazine VC with codeine for symptomatic care and use Tylenol or Advil, hydration and rest -   Saline irrigation and salt gargle advised as needed   Also See problem list. Medications and labs reviewed today.

## 2013-08-16 NOTE — Patient Instructions (Addendum)
It was good to see you today.  If you develop worsening symptoms or fever, call and we can reconsider antibiotics, but it does not appear necessary to use antibiotics at this time.  Will treat viral symptoms of congestion and headache with generic Promethazine VC and codeine. Use 1 teaspoon every 4 hours as needed for symptoms -prescription given to you to take your pharmacy  Alternate between ibuprofen and tylenol for aches, pain and fever symptoms as discussed  For depression, resume generic Effexor 37.5 mg once daily - Your prescription(s) have been submitted to your pharmacy. Please take as directed and contact our office if you believe you are having problem(s) with the medication(s).  Will also refer back to Judithe ModestSusan Bond for counseling as discussed. My office will call regarding this appointment  followup in 3-6 months, as scheduled, sooner if worse  Depression, Adult Depression refers to feeling sad, low, down in the dumps, blue, gloomy, or empty. In general, there are two kinds of depression: 1. Depression that we all experience from time to time because of upsetting life experiences, including the loss of a job or the ending of a relationship (normal sadness or normal grief). This kind of depression is considered normal, is short lived, and resolves within a few days to 2 weeks. (Depression experienced after the loss of a loved one is called bereavement. Bereavement often lasts longer than 2 weeks but normally gets better with time.) 2. Clinical depression, which lasts longer than normal sadness or normal grief or interferes with your ability to function at home, at work, and in school. It also interferes with your personal relationships. It affects almost every aspect of your life. Clinical depression is an illness. Symptoms of depression also can be caused by conditions other than normal sadness and grief or clinical depression. Examples of these conditions are listed as  follows:  Physical illness Some physical illnesses, including underactive thyroid gland (hypothyroidism), severe anemia, specific types of cancer, diabetes, uncontrolled seizures, heart and lung problems, strokes, and chronic pain are commonly associated with symptoms of depression.  Side effects of some prescription medicine In some people, certain types of prescription medicine can cause symptoms of depression.  Substance abuse Abuse of alcohol and illicit drugs can cause symptoms of depression. SYMPTOMS Symptoms of normal sadness and normal grief include the following:  Feeling sad or crying for short periods of time.  Not caring about anything (apathy).  Difficulty sleeping or sleeping too much.  No longer able to enjoy the things you used to enjoy.  Desire to be by oneself all the time (social isolation).  Lack of energy or motivation.  Difficulty concentrating or remembering.  Change in appetite or weight.  Restlessness or agitation. Symptoms of clinical depression include the same symptoms of normal sadness or normal grief and also the following symptoms:  Feeling sad or crying all the time.  Feelings of guilt or worthlessness.  Feelings of hopelessness or helplessness.  Thoughts of suicide or the desire to harm yourself (suicidal ideation).  Loss of touch with reality (psychotic symptoms). Seeing or hearing things that are not real (hallucinations) or having false beliefs about your life or the people around you (delusions and paranoia). DIAGNOSIS  The diagnosis of clinical depression usually is based on the severity and duration of the symptoms. Your caregiver also will ask you questions about your medical history and substance use to find out if physical illness, use of prescription medicine, or substance abuse is causing your depression. Your  caregiver also may order blood tests. TREATMENT  Typically, normal sadness and normal grief do not require treatment.  However, sometimes antidepressant medicine is prescribed for bereavement to ease the depressive symptoms until they resolve. The treatment for clinical depression depends on the severity of your symptoms but typically includes antidepressant medicine, counseling with a mental health professional, or a combination of both. Your caregiver will help to determine what treatment is best for you. Depression caused by physical illness usually goes away with appropriate medical treatment of the illness. If prescription medicine is causing depression, talk with your caregiver about stopping the medicine, decreasing the dose, or substituting another medicine. Depression caused by abuse of alcohol or illicit drugs abuse goes away with abstinence from these substances. Some adults need professional help in order to stop drinking or using drugs. SEEK IMMEDIATE CARE IF:  You have thoughts about hurting yourself or others.  You lose touch with reality (have psychotic symptoms).  You are taking medicine for depression and have a serious side effect. FOR MORE INFORMATION National Alliance on Mental Illness: www.nami.[employer]: http://www.maynard.net/ Document Released: 07/04/2000 Document Revised: 01/06/2012 Document Reviewed: 10/06/2011 [facility] Patient Information 2014 [place], [place].

## 2013-08-16 NOTE — Progress Notes (Signed)
Pre-visit discussion using our clinic review tool. No additional management support is needed unless otherwise documented below in the visit note.  

## 2013-08-22 ENCOUNTER — Telehealth: Payer: Self-pay | Admitting: Internal Medicine

## 2013-08-22 MED ORDER — AZITHROMYCIN 250 MG PO TABS: ORAL_TABLET | ORAL | Status: DC

## 2013-08-22 NOTE — Telephone Encounter (Signed)
Patient states that her symptoms have not yet improved and she was told to call and let Dr. Felicity CoyerLeschber know so that antibiotics could be reconsidered. Please advise.

## 2013-08-22 NOTE — Telephone Encounter (Signed)
Notified pt with md response.../lmb 

## 2013-08-22 NOTE — Telephone Encounter (Signed)
[facility]pak sent to [facility] Thanks and hope she feels better soon

## 2013-08-30 ENCOUNTER — Ambulatory Visit (INDEPENDENT_AMBULATORY_CARE_PROVIDER_SITE_OTHER): Payer: 59 | Admitting: Internal Medicine

## 2013-08-30 ENCOUNTER — Encounter: Payer: Self-pay | Admitting: Internal Medicine

## 2013-08-30 ENCOUNTER — Encounter: Payer: Self-pay | Admitting: *Deleted

## 2013-08-30 VITALS — BP 118/78 | HR 106 | Temp 98.9°F

## 2013-08-30 DIAGNOSIS — J329 Chronic sinusitis, unspecified: Secondary | ICD-10-CM

## 2013-08-30 DIAGNOSIS — J3489 Other specified disorders of nose and nasal sinuses: Secondary | ICD-10-CM

## 2013-08-30 DIAGNOSIS — IMO0001 Reserved for inherently not codable concepts without codable children: Secondary | ICD-10-CM

## 2013-08-30 DIAGNOSIS — M797 Fibromyalgia: Secondary | ICD-10-CM

## 2013-08-30 MED ORDER — PHENYLEPH-PROMETHAZINE-COD 5-6.25-10 MG/5ML PO SYRP: 5.0000 mL | ORAL_SOLUTION | ORAL | Status: DC | PRN

## 2013-08-30 MED ORDER — LEVOFLOXACIN 500 MG PO TABS: 500.0000 mg | ORAL_TABLET | Freq: Every day | ORAL | Status: DC

## 2013-08-30 NOTE — Progress Notes (Signed)
Subjective:    Patient ID: Melissa Kirby, female    DOB: [birth year], 53 y.o.   MRN: [id number]  Headache  Associated symptoms include coughing, a fever and sinus pressure. Pertinent negatives include no dizziness, rhinorrhea, sore throat or tinnitus.    Persisting frontal headache and sinus congestion Unimproved wit symptoms care and empiric abx (Zpak on 2/2) (see interval hx in EMR since last OV) Persisting intermittent fever of 101, nasal discharge thick and dark green with blood tinge  Past Medical History  Diagnosis Date  . VITAMIN B12 DEFICIENCY 09/2009 dx  . Unspecified hypothyroidism 1982/2000    surgical resection of benign growth - partial then complete  . GERD (gastroesophageal reflux disease)     per endo. - does not take any meds.    Marland Kitchen. MIGRAINE HEADACHE     last Migraine 06/29/2011-   . ANEMIA, IRON DEFICIENCY   . Kidney stones 1998, 2005    x 2  . Depression     therapy  . Fibromyalgia 1999    no meds    Review of Systems  Constitutional: Positive for fever, chills and fatigue. Negative for unexpected weight change.  HENT: Positive for congestion, mouth sores (cold sores x 48d) and sinus pressure. Negative for postnasal drip, rhinorrhea, sneezing, sore throat, tinnitus and trouble swallowing.   Respiratory: Positive for cough. Negative for shortness of breath.   Cardiovascular: Negative for chest pain and leg swelling.  Neurological: Positive for headaches. Negative for dizziness and facial asymmetry.       Objective:   Physical Exam BP 118/78  Pulse 106  Temp(Src) 98.9 F (37.2 C) (Oral)  SpO2 95% Wt Readings from Last 3 Encounters:  08/16/13 140 lb 9.6 oz (63.776 kg)  06/29/13 141 lb 3.2 oz (64.048 kg)  05/06/13 134 lb 0.1 oz (60.784 kg)   Constitutional: She appears fatigued but nontoxic, no acute distress.  HENT: Head: Normocephalic and atraumatic. Tender over bilateral frontal region, minimal maxillary sinus tenderness. Ears: B TMs ok, no erythema  or effusion; Nose: Nose normal. Mouth/Throat: Oropharynx is clear and moist. No oropharyngeal exudate.  Eyes: Conjunctivae and EOM are normal. Pupils are equal, round, and reactive to light. No scleral icterus.  Neck: Normal range of motion. Neck supple. No LAD or JVD present. No thyromegaly present.  Cardiovascular: Normal rate, regular rhythm and normal heart sounds.  No murmur heard. No BLE edema. Pulmonary/Chest: Effort normal and breath sounds normal. No respiratory distress. She has no wheezes. Neurological: She is alert and oriented to person, place, and time. No cranial nerve deficit. Coordination, balance, strength, speech and gait are normal.  Skin: Skin is warm and dry. No rash noted. No erythema.  Psychiatric: She has a normal mood and affect. Her behavior is normal. Judgment and thought content normal.   Lab Results  Component Value Date   WBC 6.4 06/29/2013   HGB 11.9* 06/29/2013   HCT 35.3* 06/29/2013   PLT 260.0 06/29/2013   GLUCOSE 79 06/29/2013   CHOL 214* 06/29/2013   TRIG 118.0 06/29/2013   HDL 55.80 06/29/2013   LDLDIRECT 145.1 06/29/2013   LDLCALC 93 10/03/2009   ALT 16 06/29/2013   AST 20 06/29/2013   NA 138 06/29/2013   K 3.9 06/29/2013   CL 104 06/29/2013   CREATININE 0.9 06/29/2013   BUN 7 06/29/2013   CO2 26 06/29/2013   TSH 0.26* 06/29/2013   INR 1.02 07/04/2011       Assessment & Plan:   Frontal  sinusitis, persisting pressure, pain, intermittent fever despite symptomatic care and empiric Z-Pak  Check CT sinuses to evaluate for impacted disease Continued decongestant with antihistamine in the form of Promethazine VC including codeine Broad antibiotic coverage to Levaquin daily for 10 days pending CT results  Continue nasal steroid Consider referral to ENT as needed based on results and symptoms, response to treatement  Problem List Items Addressed This Visit   Fibromyalgia     s/p 01/20/12 appt with rheum PA > Referred to pain mgmt for same RLE  improved s/p ESI 02/2012 increased pamelor (also for depression) 01/08/12 and again 04/2013 to help with insomnia Continue other meds as rx'd: meloxicam, gabapentin, tramadol Reviewed mild flare of chronic pain symptoms related to acute illness (sinusitis) reassurance provided, no other medication changes recommended at this time     Other Visit Diagnoses   Frontal sinus pain    -  Primary    Relevant Orders       CT Maxillofacial LTD WO CM    Sinusitis, chronic        Relevant Medications       levofloxacin (LEVAQUIN) tablet       Phenyleph-Promethazine-Cod (PROMETHAZINE VC/CODEINE) 5-6.25-10 MG/5ML SYRP    Other Relevant Orders       CT Maxillofacial LTD WO CM

## 2013-08-30 NOTE — Patient Instructions (Addendum)
It was good to see you today.  Levaquin antibiotics daily x 10days and prescription cough with decongestant syrup - Your prescription(s) have been submitted to your pharmacy. Please take as directed and contact our office if you believe you are having problem(s) with the medication(s).  we'll make referral for CT of sinuses to evaluate for persisting infection or other problem . Our office will contact you regarding appointment(s) once made.  Alternate between ibuprofen and tylenol for aches, pain and fever symptoms as discussed  Hydrate, rest and call if worse or unimproved  Work excuse note for today and tomorrow as discussed  Sinus Headache A sinus headache happens when your sinuses become clogged or puffy (swollen). Sinus headaches can be mild or severe. HOME CARE  Take your medicines (antibiotics) as told. Finish them even if you start to feel better.  Only take medicine as told by your doctor.  Use a nose spray if you feel stuffed up (congested). GET HELP RIGHT AWAY IF:  You have a fever.  You have trouble seeing.  You suddenly have pain in your face or head.  You start to twitch or shake (seizure).  You are confused.  You get headaches more than once a week.  Light or sound bothers you.  You feel sick to your stomach (nauseous) or throw up (vomit).  Your headaches do not get better with treatment. MAKE SURE YOU:  Understand these instructions.  Will watch your condition.  Will get help right away if you are not doing well or get worse. Document Released: 11/06/2010 Document Revised: 09/29/2011 Document Reviewed: 11/06/2010 [facility] Patient Information 2014 [place], [place].

## 2013-08-30 NOTE — Assessment & Plan Note (Signed)
s/p 01/20/12 appt with rheum PA > Referred to pain mgmt for same RLE improved s/p ESI 02/2012 increased pamelor (also for depression) 01/08/12 and again 04/2013 to help with insomnia Continue other meds as rx'd: meloxicam, gabapentin, tramadol Reviewed mild flare of chronic pain symptoms related to acute illness (sinusitis) reassurance provided, no other medication changes recommended at this time

## 2013-08-30 NOTE — Progress Notes (Signed)
Pre-visit discussion using our clinic review tool. No additional management support is needed unless otherwise documented below in the visit note.  

## 2013-09-01 ENCOUNTER — Telehealth: Payer: Self-pay | Admitting: Internal Medicine

## 2013-09-01 NOTE — Telephone Encounter (Signed)
Went to clinical review, called and gave clinical information still  requiring a physician to physician discussion.case # [id number]. Phone number [phone] option 3.   Cpt code [id number]

## 2013-09-06 NOTE — Telephone Encounter (Signed)
I called this AM and received approval Please use notification # [id number] Code expires 10/21/13  thanks

## 2013-09-08 ENCOUNTER — Ambulatory Visit (INDEPENDENT_AMBULATORY_CARE_PROVIDER_SITE_OTHER)
Admission: RE | Admit: 2013-09-08 | Discharge: 2013-09-08 | Disposition: A | Discharge status: Home / Self Care | Payer: 59 | Source: Ambulatory Visit | Attending: Internal Medicine | Admitting: Internal Medicine

## 2013-09-08 DIAGNOSIS — J329 Chronic sinusitis, unspecified: Secondary | ICD-10-CM

## 2013-09-08 DIAGNOSIS — J3489 Other specified disorders of nose and nasal sinuses: Secondary | ICD-10-CM

## 2013-09-09 ENCOUNTER — Telehealth: Payer: Self-pay | Admitting: Internal Medicine

## 2013-09-09 MED ORDER — BUTALBITAL-APAP-CAFFEINE 50-325-40 MG PO TABS: 1.0000 | ORAL_TABLET | Freq: Two times a day (BID) | ORAL | Status: DC | PRN

## 2013-09-09 NOTE — Telephone Encounter (Signed)
Pt had a CT yesterday.  Her headache is the same, not any better.  She has taken all of the medication.  She is requesting something called in or to be seen to get relief.

## 2013-09-09 NOTE — Telephone Encounter (Signed)
Notified pt with md response [facility].../lmb

## 2013-09-09 NOTE — Telephone Encounter (Signed)
esgic as needed - rx done

## 2013-09-15 ENCOUNTER — Ambulatory Visit: Payer: 59 | Admitting: Licensed Clinical Social Worker

## 2013-09-21 ENCOUNTER — Ambulatory Visit (INDEPENDENT_AMBULATORY_CARE_PROVIDER_SITE_OTHER): Payer: 59 | Admitting: Licensed Clinical Social Worker

## 2013-09-21 DIAGNOSIS — F4322 Adjustment disorder with anxiety: Secondary | ICD-10-CM

## 2013-09-28 ENCOUNTER — Encounter: Payer: Self-pay | Admitting: Physician Assistant

## 2013-09-28 ENCOUNTER — Ambulatory Visit (INDEPENDENT_AMBULATORY_CARE_PROVIDER_SITE_OTHER): Payer: 59 | Admitting: Physician Assistant

## 2013-09-28 ENCOUNTER — Ambulatory Visit (INDEPENDENT_AMBULATORY_CARE_PROVIDER_SITE_OTHER): Payer: 59 | Admitting: Licensed Clinical Social Worker

## 2013-09-28 VITALS — BP 132/82 | HR 112 | Temp 99.4°F | Wt 141.0 lb

## 2013-09-28 DIAGNOSIS — S46819A Strain of other muscles, fascia and tendons at shoulder and upper arm level, unspecified arm, initial encounter: Secondary | ICD-10-CM

## 2013-09-28 DIAGNOSIS — F4322 Adjustment disorder with anxiety: Secondary | ICD-10-CM

## 2013-09-28 DIAGNOSIS — S43499A Other sprain of unspecified shoulder joint, initial encounter: Secondary | ICD-10-CM

## 2013-09-28 DIAGNOSIS — R51 Headache: Secondary | ICD-10-CM

## 2013-09-28 NOTE — Patient Instructions (Signed)
It was great to meet you today Melissa Kirby!  Continue your current medication as prescribed for headache.  Please take your prescribed muscle relaxer for the next five to seven days. If no improvement of symptoms you can schedule an appointment with Dr. Terrilee Files for evaluation.   Tension Headache A tension headache is pain, pressure, or aching felt over the front and sides of the head. Tension headaches often come after stress, feeling worried (anxiety), or feeling sad or down for a while (depressed). HOME CARE  Only take medicine as told by your doctor.  Lie down in a dark, quiet room when you have a headache.  Keep a journal to find out if certain things bring on headaches. For example, write down:  What you eat and drink.  How much sleep you get.  Any change to your diet or medicines.  Relax by getting a massage or doing other relaxing activities.  Put ice or heat packs on the head and neck area as told by your doctor.  Lessen stress.  Sit up straight. Do not tighten (tense) your muscles.  Quit smoking if you smoke.  Lessen how much alcohol you drink.  Lessen how much caffeine you drink, or stop drinking caffeine.  Eat and exercise regularly.  Get enough sleep.  Avoid using too much pain medicine. GET HELP RIGHT AWAY IF:   Your headache becomes really bad.  You have a fever.  You have a stiff neck.  You have trouble seeing.  Your muscles are weak, or you lose muscle control.  You lose your balance or have trouble walking.  You feel like you will pass out (faint), or you pass out.  You have really bad symptoms that are different than your first symptoms.  You have problems with the medicines given to you by your doctor.  Your medicines do not work.  Your headache feels different than the other headaches.  You feel sick to your stomach (nauseous) or throw up (vomit). MAKE SURE YOU:   Understand these instructions.  Will watch your  condition.  Will get help right away if you are not doing well or get worse. Document Released: 10/01/2009 Document Revised: 09/29/2011 Document Reviewed: 06/27/2011 [facility] Patient Information 2014 [place], [place]. Muscle Strain A muscle strain (pulled muscle) happens when a muscle is stretched beyond normal length. It happens when a sudden, violent force stretches your muscle too far. Usually, a few of the fibers in your muscle are torn. Muscle strain is common in athletes. Recovery usually takes 1 2 weeks. Complete healing takes 5 6 weeks.  HOME CARE   Follow the PRICE method of treatment to help your injury get better. Do this the first 2 3 days after the injury:  Protect. Protect the muscle to keep it from getting injured again.  Rest. Limit your activity and rest the injured body part.  Ice. Put ice in a plastic bag. Place a towel between your skin and the bag. Then, apply the ice and leave it on from 15 20 minutes each hour. After the third day, switch to moist heat packs.  Compression. Use a splint or elastic bandage on the injured area for comfort. Do not put it on too tightly.  Elevate. Keep the injured body part above the level of your heart.  Only take medicine as told by your doctor.  Warm up before doing exercise to prevent future muscle strains. GET HELP IF:   You have more pain or puffiness (swelling) in  the injured area.  You feel numbness, tingling, or notice a loss of strength in the injured area. MAKE SURE YOU:   Understand these instructions.  Will watch your condition.  Will get help right away if you are not doing well or get worse. Document Released: 04/15/2008 Document Revised: 04/27/2013 Document Reviewed: 02/03/2013 [facility] Patient Information 2014 [place], [place].

## 2013-09-28 NOTE — Progress Notes (Signed)
Pre visit review using our clinic review tool, if applicable. No additional management support is needed unless otherwise documented below in the visit note. 

## 2013-09-29 NOTE — Progress Notes (Signed)
Subjective:    Patient ID: Melissa Kirby, female    DOB: Jun 11, 1961, 53 y.o.   MRN: [id number]  HPI Comments: Patient is a 53 year old female who presents to the office with complaint of headache. Reports history of migraine headaches however, this headache is not her migraine headache. Reports she has had this same headache many times and has now figured out it is work related. Has had CT of sinus which was negative. Has used Esgic with good relief along with ice however, can not use the medication while working because it makes her tired. Reports two days PTA while at work she had a sneeze and then had upper shoulder pain which proceeded into a headache in her forehead, the same spot it normally is in. Thinks the cause of her headaches is her neck pain. Reports she works 12 hour shifts with her shoulders shrugged and her hands elevated in front of her manipulating small parts. States today the headache is gone however still has the shoulder pain. Denies N/V, photo/phonaphobia, numbness, tingling or weakness of the UE, denies eye pain or pressure. Denies URI symptoms.    Review of Systems  Constitutional: Negative for fever and chills.  HENT: Negative for rhinorrhea, sinus pressure and sore throat.   Eyes: Negative for redness and visual disturbance.  Gastrointestinal: Negative for nausea and vomiting.  Musculoskeletal: Positive for myalgias (across top of both shoulders.).  Neurological: Positive for headaches. Negative for dizziness, weakness, light-headedness and numbness.   Past Medical History  Diagnosis Date  . VITAMIN B12 DEFICIENCY 09/2009 dx  . Unspecified hypothyroidism 1982/2000    surgical resection of benign growth - partial then complete  . GERD (gastroesophageal reflux disease)     per endo. - does not take any meds.    Marland Kitchen MIGRAINE HEADACHE     last Migraine 06/29/2011-   . ANEMIA, IRON DEFICIENCY   . Kidney stones 1998, 2005    x 2  . Depression     therapy  . Fibromyalgia  1999    no meds      Objective:   Physical Exam  Vitals reviewed. Constitutional: She is oriented to person, place, and time. She appears well-developed and well-nourished. No distress.  HENT:  Head: Normocephalic and atraumatic.  Right Ear: External ear normal.  Left Ear: External ear normal.  Nose: Nose normal.  Mouth/Throat: Oropharynx is clear and moist.  Eyes: Conjunctivae and EOM are normal. Pupils are equal, round, and reactive to light.  Neck: Normal range of motion.  Cardiovascular: Normal rate and regular rhythm.  Exam reveals no gallop and no friction rub.   No murmur heard. Pulmonary/Chest: Effort normal and breath sounds normal. She has no wheezes. She has no rhonchi. She has no rales.  Musculoskeletal:  Bilateral traps and cervical paraspinals tender to palpation. No mass or spasm noted. No midline tenderness noted. Bilateral UE DNVI.  Neurological: She is alert and oriented to person, place, and time. She has normal strength. No cranial nerve deficit or sensory deficit. GCS eye subscore is 4. GCS verbal subscore is 5. GCS motor subscore is 6.  Skin: Skin is warm and dry.  Psychiatric: She has a normal mood and affect.   Lab Results  Component Value Date   WBC 6.4 06/29/2013   HGB 11.9* 06/29/2013   HCT 35.3* 06/29/2013   PLT 260.0 06/29/2013   GLUCOSE 79 06/29/2013   CHOL 214* 06/29/2013   TRIG 118.0 06/29/2013   HDL 55.80 06/29/2013  LDLDIRECT 145.1 06/29/2013   LDLCALC 93 10/03/2009   ALT 16 06/29/2013   AST 20 06/29/2013   NA 138 06/29/2013   K 3.9 06/29/2013   CL 104 06/29/2013   CREATININE 0.9 06/29/2013   BUN 7 06/29/2013   CO2 26 06/29/2013   TSH 0.26* 06/29/2013   INR 1.02 07/04/2011   Filed Vitals:   09/28/13 1435  BP: 132/82  Pulse: 112  Temp: 99.4 F (37.4 C)      Assessment & Plan:   Headache Currently resolved Continue with current medication as prescribed  Trapezius strain Patient reports has muscle relaxer at home from  previous back pain/surgery, instructed to use as directed for next five days along with ice/heat. If no improvement of symptoms can schedule appointment with Dr. Terrilee FilesZach Smith for further evaluation and treatment.

## 2013-10-04 ENCOUNTER — Ambulatory Visit (INDEPENDENT_AMBULATORY_CARE_PROVIDER_SITE_OTHER): Payer: 59 | Admitting: Licensed Clinical Social Worker

## 2013-10-04 DIAGNOSIS — F4323 Adjustment disorder with mixed anxiety and depressed mood: Secondary | ICD-10-CM

## 2013-10-13 ENCOUNTER — Ambulatory Visit (INDEPENDENT_AMBULATORY_CARE_PROVIDER_SITE_OTHER): Payer: 59 | Admitting: Internal Medicine

## 2013-10-13 ENCOUNTER — Ambulatory Visit (INDEPENDENT_AMBULATORY_CARE_PROVIDER_SITE_OTHER): Payer: 59 | Admitting: Licensed Clinical Social Worker

## 2013-10-13 ENCOUNTER — Other Ambulatory Visit: Payer: Self-pay | Admitting: Internal Medicine

## 2013-10-13 ENCOUNTER — Ambulatory Visit (INDEPENDENT_AMBULATORY_CARE_PROVIDER_SITE_OTHER): Payer: 59 | Admitting: *Deleted

## 2013-10-13 ENCOUNTER — Encounter: Payer: Self-pay | Admitting: Internal Medicine

## 2013-10-13 ENCOUNTER — Ambulatory Visit
Admission: RE | Admit: 2013-10-13 | Discharge: 2013-10-13 | Disposition: A | Discharge status: Home / Self Care | Payer: 59 | Source: Ambulatory Visit | Attending: Internal Medicine | Admitting: Internal Medicine

## 2013-10-13 VITALS — BP 150/90 | HR 80 | Temp 98.4°F | Resp 16 | Wt 139.0 lb

## 2013-10-13 DIAGNOSIS — R030 Elevated blood-pressure reading, without diagnosis of hypertension: Secondary | ICD-10-CM

## 2013-10-13 DIAGNOSIS — W19XXXA Unspecified fall, initial encounter: Secondary | ICD-10-CM

## 2013-10-13 DIAGNOSIS — R519 Headache, unspecified: Secondary | ICD-10-CM

## 2013-10-13 DIAGNOSIS — G43909 Migraine, unspecified, not intractable, without status migrainosus: Secondary | ICD-10-CM

## 2013-10-13 DIAGNOSIS — F4322 Adjustment disorder with anxiety: Secondary | ICD-10-CM

## 2013-10-13 DIAGNOSIS — R51 Headache: Secondary | ICD-10-CM

## 2013-10-13 DIAGNOSIS — Y92009 Unspecified place in unspecified non-institutional (private) residence as the place of occurrence of the external cause: Secondary | ICD-10-CM

## 2013-10-13 DIAGNOSIS — IMO0001 Reserved for inherently not codable concepts without codable children: Secondary | ICD-10-CM

## 2013-10-13 DIAGNOSIS — R7989 Other specified abnormal findings of blood chemistry: Secondary | ICD-10-CM

## 2013-10-13 LAB — CBC WITH DIFFERENTIAL/PLATELET
Basophils Absolute: 0 10*3/uL (ref 0.0–0.1)
Basophils Relative: 0.4 % (ref 0.0–3.0)
Eosinophils Absolute: 0.1 10*3/uL (ref 0.0–0.7)
Eosinophils Relative: 1.5 % (ref 0.0–5.0)
HCT: 36.7 % (ref 36.0–46.0)
Hemoglobin: 12.3 g/dL (ref 12.0–15.0)
Lymphocytes Relative: 34.7 % (ref 12.0–46.0)
Lymphs Abs: 2 10*3/uL (ref 0.7–4.0)
MCHC: 33.4 g/dL (ref 30.0–36.0)
MCV: 88.3 fl (ref 78.0–100.0)
Monocytes Absolute: 0.7 10*3/uL (ref 0.1–1.0)
Monocytes Relative: 11.7 % (ref 3.0–12.0)
Neutro Abs: 3 10*3/uL (ref 1.4–7.7)
Neutrophils Relative %: 51.7 % (ref 43.0–77.0)
Platelets: 254 10*3/uL (ref 150.0–400.0)
RBC: 4.16 Mil/uL (ref 3.87–5.11)
RDW: 13.3 % (ref 11.5–14.6)
WBC: 5.9 10*3/uL (ref 4.5–10.5)

## 2013-10-13 LAB — URINALYSIS, ROUTINE W REFLEX MICROSCOPIC
Bilirubin Urine: NEGATIVE
Hgb urine dipstick: NEGATIVE
Ketones, ur: NEGATIVE
Nitrite: NEGATIVE
Specific Gravity, Urine: <=1.005 — AB (ref 1.000–1.030)
Total Protein, Urine: NEGATIVE
Urine Glucose: NEGATIVE
Urobilinogen, UA: 0.2 (ref 0.0–1.0)
pH: 6 (ref 5.0–8.0)

## 2013-10-13 LAB — BASIC METABOLIC PANEL
BUN: 8 mg/dL (ref 6–23)
CO2: 25 mEq/L (ref 19–32)
Calcium: 9.5 mg/dL (ref 8.4–10.5)
Chloride: 104 mEq/L (ref 96–112)
Creatinine, Ser: 0.8 mg/dL (ref 0.4–1.2)
GFR: 86.41 mL/min (ref >60.00)
Glucose, Bld: 93 mg/dL (ref 70–99)
Potassium: 4.4 mEq/L (ref 3.5–5.1)
Sodium: 136 mEq/L (ref 135–145)

## 2013-10-13 LAB — HEPATIC FUNCTION PANEL
ALT: 18 U/L (ref 0–35)
AST: 24 U/L (ref 0–37)
Albumin: 4.4 g/dL (ref 3.5–5.2)
Alkaline Phosphatase: 165 U/L — ABNORMAL HIGH (ref 39–117)
Bilirubin, Direct: 0 mg/dL (ref 0.0–0.3)
Total Bilirubin: 0.3 mg/dL (ref 0.3–1.2)
Total Protein: 8 g/dL (ref 6.0–8.3)

## 2013-10-13 LAB — SEDIMENTATION RATE: Sed Rate: 35 mm/hr — ABNORMAL HIGH (ref 0–22)

## 2013-10-13 LAB — TSH: TSH: 0.19 u[IU]/mL — ABNORMAL LOW (ref 0.35–5.50)

## 2013-10-13 LAB — VITAMIN B12: Vitamin B-12: 281 pg/mL (ref 211–911)

## 2013-10-13 MED ORDER — METHYLPREDNISOLONE ACETATE 80 MG/ML IJ SUSP
80.0000 mg | Freq: Once | INTRAMUSCULAR | Status: AC
  Administered 2013-10-13: 80 mg via INTRAMUSCULAR

## 2013-10-13 MED ORDER — INDOMETHACIN 25 MG PO CAPS: 25.0000 mg | ORAL_CAPSULE | Freq: Two times a day (BID) | ORAL | Status: DC

## 2013-10-13 NOTE — Progress Notes (Signed)
Pre visit review using our clinic review tool, if applicable. No additional management support is needed unless otherwise documented below in the visit note. 

## 2013-10-13 NOTE — Assessment & Plan Note (Addendum)
Worse MRI brain Neurology ref Dr Allena KatzPatel Depomedrol 80 mg Labs  To work on 10/27/13. Was sick since 10/11/13

## 2013-10-13 NOTE — Patient Instructions (Addendum)
To work on 10/27/13. Was sick since 10/11/13

## 2013-10-13 NOTE — Progress Notes (Signed)
Subjective:     Headache  This is a new problem. The current episode started more than 1 month ago. The problem occurs daily. The problem has been gradually worsening. The pain is located in the bilateral and frontal (burning top of the head) region. The pain does not radiate. The pain quality is not similar to prior headaches. Quality: burning. The pain is at a severity of 10/10. The pain is severe. Associated symptoms include neck pain. Pertinent negatives include no abdominal pain, back pain, coughing, dizziness, ear pain, eye watering, nausea, numbness, photophobia, scalp tenderness, sinus pressure, tinnitus, vomiting or weakness. There is no history of hypertension, obesity, recent head traumas, sinus disease or TMJ.  Fell on Mon w/a HA Had a sinus CT - OK  BP Readings from Last 3 Encounters:  10/13/13 150/90  09/28/13 132/82  08/30/13 118/78   Wt Readings from Last 3 Encounters:  10/13/13 139 lb (63.05 kg)  09/28/13 141 lb (63.957 kg)  08/16/13 140 lb 9.6 oz (63.776 kg)    Review of Systems  Constitutional: Positive for fatigue. Negative for chills, activity change, appetite change and unexpected weight change.  HENT: Negative for congestion, ear discharge, ear pain, mouth sores, sinus pressure, sneezing and tinnitus.   Eyes: Negative for photophobia and visual disturbance.  Respiratory: Negative for cough and chest tightness.   Gastrointestinal: Negative for nausea, vomiting and abdominal pain.  Genitourinary: Negative for frequency, difficulty urinating and vaginal pain.  Musculoskeletal: Positive for neck pain. Negative for back pain and gait problem.  Skin: Negative for pallor and rash.  Neurological: Positive for headaches. Negative for dizziness, tremors, weakness and numbness.  Psychiatric/Behavioral: Negative for suicidal ideas, hallucinations, confusion, sleep disturbance and decreased concentration. The patient is not nervous/anxious.        Objective:   Physical  Exam  Constitutional: She appears well-developed. No distress.  Looks tired  HENT:  Head: Normocephalic.  Right Ear: External ear normal.  Left Ear: External ear normal.  Nose: Nose normal.  Mouth/Throat: Oropharynx is clear and moist.  Eyes: Conjunctivae are normal. Pupils are equal, round, and reactive to light. Right eye exhibits no discharge. Left eye exhibits no discharge.  Neck: Normal range of motion. Neck supple. No JVD present. No tracheal deviation present. No thyromegaly present.  Cardiovascular: Normal rate, regular rhythm and normal heart sounds.   Pulmonary/Chest: No stridor. No respiratory distress. She has no wheezes.  Abdominal: Soft. Bowel sounds are normal. She exhibits no distension and no mass. There is no tenderness. There is no rebound and no guarding.  Musculoskeletal: She exhibits no edema and no tenderness.  Lymphadenopathy:    She has no cervical adenopathy.  Neurological: She displays normal reflexes. No cranial nerve deficit. She exhibits normal muscle tone. Coordination normal.  nonfocal  Skin: No rash noted. No erythema.  Psychiatric: She has a normal mood and affect. Her behavior is normal. Judgment and thought content normal.  No pulsating arteries on scalp   Lab Results  Component Value Date   WBC 6.4 06/29/2013   HGB 11.9* 06/29/2013   HCT 35.3* 06/29/2013   PLT 260.0 06/29/2013   GLUCOSE 79 06/29/2013   CHOL 214* 06/29/2013   TRIG 118.0 06/29/2013   HDL 55.80 06/29/2013   LDLDIRECT 145.1 06/29/2013   LDLCALC 93 10/03/2009   ALT 16 06/29/2013   AST 20 06/29/2013   NA 138 06/29/2013   K 3.9 06/29/2013   CL 104 06/29/2013   CREATININE 0.9 06/29/2013   BUN 7  06/29/2013   CO2 26 06/29/2013   TSH 0.26* 06/29/2013   INR 1.02 07/04/2011    A complex case    Assessment & Plan:

## 2013-10-13 NOTE — Assessment & Plan Note (Signed)
3/14 fell w/sharp head pain  Off work x 2 wks Dr Felicity CoyerLeschber in  2 weeks

## 2013-10-13 NOTE — Assessment & Plan Note (Signed)
It is different from her new HA

## 2013-10-13 NOTE — Assessment & Plan Note (Signed)
Wean off Effexor

## 2013-10-14 LAB — VITAMIN D 25 HYDROXY (VIT D DEFICIENCY, FRACTURES): Vit D, 25-Hydroxy: 18 ng/mL — ABNORMAL LOW (ref 30–89)

## 2013-10-15 ENCOUNTER — Encounter (HOSPITAL_COMMUNITY): Payer: Self-pay | Admitting: Emergency Medicine

## 2013-10-15 ENCOUNTER — Emergency Department (HOSPITAL_COMMUNITY)
Admission: EM | Admit: 2013-10-15 | Discharge: 2013-10-15 | Disposition: A | Discharge status: Home / Self Care | Payer: 59 | Attending: Emergency Medicine | Admitting: Emergency Medicine

## 2013-10-15 DIAGNOSIS — Z79899 Other long term (current) drug therapy: Secondary | ICD-10-CM | POA: Insufficient documentation

## 2013-10-15 DIAGNOSIS — IMO0001 Reserved for inherently not codable concepts without codable children: Secondary | ICD-10-CM | POA: Insufficient documentation

## 2013-10-15 DIAGNOSIS — R630 Anorexia: Secondary | ICD-10-CM | POA: Insufficient documentation

## 2013-10-15 DIAGNOSIS — R930 Abnormal findings on diagnostic imaging of skull and head, not elsewhere classified: Secondary | ICD-10-CM

## 2013-10-15 DIAGNOSIS — Z791 Long term (current) use of non-steroidal anti-inflammatories (NSAID): Secondary | ICD-10-CM | POA: Insufficient documentation

## 2013-10-15 DIAGNOSIS — F329 Major depressive disorder, single episode, unspecified: Secondary | ICD-10-CM | POA: Insufficient documentation

## 2013-10-15 DIAGNOSIS — R9089 Other abnormal findings on diagnostic imaging of central nervous system: Secondary | ICD-10-CM | POA: Insufficient documentation

## 2013-10-15 DIAGNOSIS — Z87442 Personal history of urinary calculi: Secondary | ICD-10-CM | POA: Insufficient documentation

## 2013-10-15 DIAGNOSIS — Z8719 Personal history of other diseases of the digestive system: Secondary | ICD-10-CM | POA: Insufficient documentation

## 2013-10-15 DIAGNOSIS — D509 Iron deficiency anemia, unspecified: Secondary | ICD-10-CM | POA: Insufficient documentation

## 2013-10-15 DIAGNOSIS — E039 Hypothyroidism, unspecified: Secondary | ICD-10-CM | POA: Insufficient documentation

## 2013-10-15 DIAGNOSIS — G43909 Migraine, unspecified, not intractable, without status migrainosus: Secondary | ICD-10-CM | POA: Insufficient documentation

## 2013-10-15 DIAGNOSIS — R519 Headache, unspecified: Secondary | ICD-10-CM

## 2013-10-15 DIAGNOSIS — F3289 Other specified depressive episodes: Secondary | ICD-10-CM | POA: Insufficient documentation

## 2013-10-15 DIAGNOSIS — R51 Headache: Secondary | ICD-10-CM | POA: Insufficient documentation

## 2013-10-15 DIAGNOSIS — Z8639 Personal history of other endocrine, nutritional and metabolic disease: Secondary | ICD-10-CM | POA: Insufficient documentation

## 2013-10-15 MED ORDER — NORTRIPTYLINE HCL 50 MG PO CAPS
100.0000 mg | ORAL_CAPSULE | Freq: Every day | ORAL | Status: DC
Start: 2013-10-15 — End: 2013-11-14

## 2013-10-15 MED ORDER — ONDANSETRON HCL 4 MG/2ML IJ SOLN
4.0000 mg | Freq: Once | INTRAMUSCULAR | Status: AC
  Administered 2013-10-15: 4 mg via INTRAVENOUS
  Filled 2013-10-15: qty 2

## 2013-10-15 MED ORDER — SODIUM CHLORIDE 0.9 % IV BOLUS (SEPSIS)
500.0000 mL | Freq: Once | INTRAVENOUS | Status: AC
  Administered 2013-10-15: 500 mL via INTRAVENOUS

## 2013-10-15 MED ORDER — HYDROMORPHONE HCL PF 1 MG/ML IJ SOLN
1.0000 mg | INTRAMUSCULAR | Status: DC | PRN
  Administered 2013-10-15: 1 mg via INTRAVENOUS
  Filled 2013-10-15: qty 1

## 2013-10-15 MED ORDER — ERGOCALCIFEROL 1.25 MG (50000 UT) PO CAPS: 50000.0000 [IU] | ORAL_CAPSULE | ORAL | Status: DC

## 2013-10-15 MED ORDER — DIPHENHYDRAMINE HCL 50 MG/ML IJ SOLN
12.5000 mg | Freq: Once | INTRAMUSCULAR | Status: AC
  Administered 2013-10-15: 12.5 mg via INTRAVENOUS
  Filled 2013-10-15: qty 1

## 2013-10-15 NOTE — Discharge Instructions (Signed)
Continue your effexor taper.  Neurology has recommended you take 100 mg of nortriptyline at night.   Headaches, Frequently Asked Questions MIGRAINE HEADACHES Q: What is migraine? What causes it? How can I treat it? A: Generally, migraine headaches begin as a dull ache. Then they develop into a constant, throbbing, and pulsating pain. You may experience pain at the temples. You may experience pain at the front or back of one or both sides of the head. The pain is usually accompanied by a combination of:  Nausea.  Vomiting.  Sensitivity to light and noise. Some people (about 15%) experience an aura (see below) before an attack. The cause of migraine is believed to be chemical reactions in the brain. Treatment for migraine may include over-the-counter or prescription medications. It may also include self-help techniques. These include relaxation training and biofeedback.  Q: What is an aura? A: About 15% of people with migraine get an "aura". This is a sign of neurological symptoms that occur before a migraine headache. You may see wavy or jagged lines, dots, or flashing lights. You might experience tunnel vision or blind spots in one or both eyes. The aura can include visual or auditory hallucinations (something imagined). It may include disruptions in smell (such as strange odors), taste or touch. Other symptoms include:  Numbness.  A "pins and needles" sensation.  Difficulty in recalling or speaking the correct word. These neurological events may last as long as 60 minutes. These symptoms will fade as the headache begins. Q: What is a trigger? A: Certain physical or environmental factors can lead to or "trigger" a migraine. These include:  Foods.  Hormonal changes.  Weather.  Stress. It is important to remember that triggers are different for everyone. To help prevent migraine attacks, you need to figure out which triggers affect you. Keep a headache diary. This is a good way to  track triggers. The diary will help you talk to your healthcare professional about your condition. Q: Does weather affect migraines? A: Bright sunshine, hot, humid conditions, and drastic changes in barometric pressure may lead to, or "trigger," a migraine attack in some people. But studies have shown that weather does not act as a trigger for everyone with migraines. Q: What is the link between migraine and hormones? A: Hormones start and regulate many of your body's functions. Hormones keep your body in balance within a constantly changing environment. The levels of hormones in your body are unbalanced at times. Examples are during menstruation, pregnancy, or menopause. That can lead to a migraine attack. In fact, about three quarters of all women with migraine report that their attacks are related to the menstrual cycle.  Q: Is there an increased risk of stroke for migraine sufferers? A: The likelihood of a migraine attack causing a stroke is very remote. That is not to say that migraine sufferers cannot have a stroke associated with their migraines. In persons under age 52, the most common associated factor for stroke is migraine headache. But over the course of a person's normal life span, the occurrence of migraine headache may actually be associated with a reduced risk of dying from cerebrovascular disease due to stroke.  Q: What are acute medications for migraine? A: Acute medications are used to treat the pain of the headache after it has started. Examples over-the-counter medications, NSAIDs, ergots, and triptans.  Q: What are the triptans? A: Triptans are the newest class of abortive medications. They are specifically targeted to treat migraine. Triptans are vasoconstrictors.  They moderate some chemical reactions in the brain. The triptans work on receptors in your brain. Triptans help to restore the balance of a neurotransmitter called serotonin. Fluctuations in levels of serotonin are thought  to be a main cause of migraine.  Q: Are over-the-counter medications for migraine effective? A: Over-the-counter, or "OTC," medications may be effective in relieving mild to moderate pain and associated symptoms of migraine. But you should see your caregiver before beginning any treatment regimen for migraine.  Q: What are preventive medications for migraine? A: Preventive medications for migraine are sometimes referred to as "prophylactic" treatments. They are used to reduce the frequency, severity, and length of migraine attacks. Examples of preventive medications include antiepileptic medications, antidepressants, beta-blockers, calcium channel blockers, and NSAIDs (nonsteroidal anti-inflammatory drugs). Q: Why are anticonvulsants used to treat migraine? A: During the past few years, there has been an increased interest in antiepileptic drugs for the prevention of migraine. They are sometimes referred to as "anticonvulsants". Both epilepsy and migraine may be caused by similar reactions in the brain.  Q: Why are antidepressants used to treat migraine? A: Antidepressants are typically used to treat people with depression. They may reduce migraine frequency by regulating chemical levels, such as serotonin, in the brain.  Q: What alternative therapies are used to treat migraine? A: The term "alternative therapies" is often used to describe treatments considered outside the scope of conventional Western medicine. Examples of alternative therapy include acupuncture, acupressure, and yoga. Another common alternative treatment is herbal therapy. Some herbs are believed to relieve headache pain. Always discuss alternative therapies with your caregiver before proceeding. Some herbal products contain arsenic and other toxins. TENSION HEADACHES Q: What is a tension-type headache? What causes it? How can I treat it? A: Tension-type headaches occur randomly. They are often the result of temporary stress, anxiety,  fatigue, or anger. Symptoms include soreness in your temples, a tightening band-like sensation around your head (a "vice-like" ache). Symptoms can also include a pulling feeling, pressure sensations, and contracting head and neck muscles. The headache begins in your forehead, temples, or the back of your head and neck. Treatment for tension-type headache may include over-the-counter or prescription medications. Treatment may also include self-help techniques such as relaxation training and biofeedback. CLUSTER HEADACHES Q: What is a cluster headache? What causes it? How can I treat it? A: Cluster headache gets its name because the attacks come in groups. The pain arrives with little, if any, warning. It is usually on one side of the head. A tearing or bloodshot eye and a runny nose on the same side of the headache may also accompany the pain. Cluster headaches are believed to be caused by chemical reactions in the brain. They have been described as the most severe and intense of any headache type. Treatment for cluster headache includes prescription medication and oxygen. SINUS HEADACHES Q: What is a sinus headache? What causes it? How can I treat it? A: When a cavity in the bones of the face and skull (a sinus) becomes inflamed, the inflammation will cause localized pain. This condition is usually the result of an allergic reaction, a tumor, or an infection. If your headache is caused by a sinus blockage, such as an infection, you will probably have a fever. An x-ray will confirm a sinus blockage. Your caregiver's treatment might include antibiotics for the infection, as well as antihistamines or decongestants.  REBOUND HEADACHES Q: What is a rebound headache? What causes it? How can I treat it? A:  A pattern of taking acute headache medications too often can lead to a condition known as "rebound headache." A pattern of taking too much headache medication includes taking it more than 2 days per week or in  excessive amounts. That means more than the label or a caregiver advises. With rebound headaches, your medications not only stop relieving pain, they actually begin to cause headaches. Doctors treat rebound headache by tapering the medication that is being overused. Sometimes your caregiver will gradually substitute a different type of treatment or medication. Stopping may be a challenge. Regularly overusing a medication increases the potential for serious side effects. Consult a caregiver if you regularly use headache medications more than 2 days per week or more than the label advises. ADDITIONAL QUESTIONS AND ANSWERS Q: What is biofeedback? A: Biofeedback is a self-help treatment. Biofeedback uses special equipment to monitor your body's involuntary physical responses. Biofeedback monitors:  Breathing.  Pulse.  Heart rate.  Temperature.  Muscle tension.  Brain activity. Biofeedback helps you refine and perfect your relaxation exercises. You learn to control the physical responses that are related to stress. Once the technique has been mastered, you do not need the equipment any more. Q: Are headaches hereditary? A: Four out of five (80%) of people that suffer report a family history of migraine. Scientists are not sure if this is genetic or a family predisposition. Despite the uncertainty, a child has a 50% chance of having migraine if one parent suffers. The child has a 75% chance if both parents suffer.  Q: Can children get headaches? A: By the time they reach high school, most young people have experienced some type of headache. Many safe and effective approaches or medications can prevent a headache from occurring or stop it after it has begun.  Q: What type of doctor should I see to diagnose and treat my headache? A: Start with your primary caregiver. Discuss his or her experience and approach to headaches. Discuss methods of classification, diagnosis, and treatment. Your caregiver may  decide to recommend you to a headache specialist, depending upon your symptoms or other physical conditions. Having diabetes, allergies, etc., may require a more comprehensive and inclusive approach to your headache. The National Head[employer] Foundation will provide, upon request, a list of [facility] physician members in your state. Document Released: 09/27/2003 Document Revised: 09/29/2011 Document Reviewed: 03/06/2008 [facility] Patient Information 2014 [place], [place].

## 2013-10-15 NOTE — ED Provider Notes (Signed)
CSN: [id number]     Arrival date & time 10/15/13  1521 History   First MD Initiated Contact with Patient 10/15/13 1611     Chief Complaint  Patient presents with  . Headache   HPI Melissa Kirby is 53 y.o. female with a history of migraines and frequent headaches. She states she only has had migraines 1-2 times a year before being started on Effexor 37.5 mg at the beginning of February. She has been seen at her PCP for headaches with MRI completed this past week (results below). They were concerned that some of her medications may be causing her increase in frequency of headaches. They started her on Effexor taper which began yesterday. Today at 6 AM she woke up with a sharp throbbing pain at the top of her head. She has not eaten or drinking well today and she did not take her morning dose of Effexor. She states currently it hurts to open up her eyes and her pain is a 10 out of 10. She denies nausea, vomit, neck pain , fever, visual changes or photophobia. She has also been on nortriptyline and B-A-C daily fpr headaches for "years". Patient is scheduled to see neurology, Dr. Tempie Donning in [place], for the 16th of April, but she received a message on my chart that her primary was moving up her appointment since her MRI resulted with concerns. Patient was given a "shot" in the office on the 26th and given new prescription for indomethacin for her headaches. She was advised to stop all other medications except synthroid and tapering Effexor.   In office on 10/13/2013, she was noted to have mildly elevated ESR, normal WBC, low vitamin D and mildly elevated alk phos (chronic).    Past Medical History  Diagnosis Date  . VITAMIN B12 DEFICIENCY 09/2009 dx  . Unspecified hypothyroidism 1982/2000    surgical resection of benign growth - partial then complete  . GERD (gastroesophageal reflux disease)     per endo. - does not take any meds.    Marland Kitchen MIGRAINE HEADACHE     last Migraine 06/29/2011-   . ANEMIA, IRON  DEFICIENCY   . Kidney stones 1998, 2005    x 2  . Depression     therapy  . Fibromyalgia 1999    no meds   Past Surgical History  Procedure Laterality Date  . Total thyroidectomy  2000    Had thyroid nodule, nonmalignant ([id number]) resection  . Cholecystectomy  2009  . Tubal ligation  1989  . Diagnostic laparoscopy  1982    diagnostic  . Thyroid surgery  1983    partial   . Posterior fusion lumbar spine  06/2011   Family History  Problem Relation Age of Onset  . Hyperlipidemia Mother   . Heart disease Mother   . Hyperlipidemia Father   . Breast cancer Maternal Grandmother   . Hyperlipidemia Maternal Grandmother   . Ovarian cancer Other   . Anesthesia problems Neg Hx   . Malignant hyperthermia Neg Hx   . Pseudochol deficiency Neg Hx   . Hypotension Neg Hx    History  Substance Use Topics  . Smoking status: Never Smoker   . Smokeless tobacco: Not on file     Comment: Married, lives with spouse. 3 children. 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 Alcohol Use: No   OB History   Grav Para Term Preterm Abortions TAB SAB Ect Mult Living  Review of Systems  Constitutional: Positive for activity change and appetite change. Negative for fever, chills, diaphoresis, fatigue and unexpected weight change.  HENT: Negative for postnasal drip, rhinorrhea and sore throat.   Eyes: Negative for pain.  Respiratory: Negative for chest tightness, shortness of breath and wheezing.   Cardiovascular: Negative for chest pain and palpitations.  Gastrointestinal: Negative for abdominal pain.  Skin: Negative.   Neurological: Positive for headaches. Negative for dizziness, tremors, seizures and syncope.    Allergies  Compazine; Gluten meal; Metoclopramide hcl; and Morphine and related  Home Medications   Current Outpatient Rx  Name  Route  Sig  Dispense  Refill  . Cyanocobalamin (VITAMIN B-12) 1000 MCG SUBL   Sublingual   Place 1 tablet (1,000 mcg total)  under the tongue daily.   100 tablet   3   . diphenhydrAMINE (BENADRYL) 25 MG tablet   Oral   Take 50 mg by mouth every 6 (six) hours as needed for itching.         . ergocalciferol (VITAMIN D2) 50000 UNITS capsule   Oral   Take 1 capsule (50,000 Units total) by mouth once a week.   6 capsule   0   . indomethacin (INDOCIN) 25 MG capsule   Oral   Take 1 capsule (25 mg total) by mouth 2 (two) times daily with a meal.   60 capsule   1   . levothyroxine (SYNTHROID, LEVOTHROID) 175 MCG tablet   Oral   Take 175 mcg by mouth daily before breakfast.         . methocarbamol (ROBAXIN-750) 750 MG tablet   Oral   Take 1 tablet (750 mg total) by mouth every 8 (eight) hours as needed for muscle spasms.   30 tablet   1   . Multiple Vitamin (MULTIVITAMIN) tablet   Oral   Take 1 tablet by mouth daily.           Marland Kitchen venlafaxine XR (EFFEXOR XR) 37.5 MG 24 hr capsule   Oral   Take 1 capsule (37.5 mg total) by mouth daily.   30 capsule   2   . nortriptyline (PAMELOR) 50 MG capsule   Oral   Take 2 capsules (100 mg total) by mouth at bedtime.   60 capsule   0    BP 145/100  Pulse 87  Temp(Src) 98.1 F (36.7 C) (Oral)  Resp 18  Ht '5\' 3"'  (1.6 m)  Wt 141 lb (63.957 kg)  BMI 24.98 kg/m2  SpO2 100% Physical Exam Gen: appears to be in pain. Pt holding her eyes shut.  HEENT: AT. [place]. Bilateral TM visualized and normal in appearance. Bilateral eyes without injections or icterus. Mildly dry mucous membranes. Bilateral nares pale. Throat without erythema or exudates.  CV: Mildly tachycardiac. No murmur, click, gallops or rubs.  Chest: CTAB, no wheeze or crackles Abd: Soft. NTND. BS present. No Masses palpated.  Ext: No erythema. No edema. 2/4 pulses bilateral UE/LE.  Skin: No rashes, purpura or petechiae.  Neuro: Normal gait. PERLA. EOMi. Alert. Oriented. CN 2-12 intact. No focal deficits noted. Negative kernig's. MSK: 5/5 MS bilateral UE/LE.  Psych: Normal affect, dress,  demeanor. Speech mildly slowed.   ED Course  Procedures (including critical care time) Labs Review Labs Reviewed - No data to display Imaging Review No results found.   EKG Interpretation None     10/13/2013 MRI  Compared to 2010, the patient's cerebellum and brainstem appear  significantly inferiorly displaced, with flattening  of the ventral  pons. The suprasellar cistern volume has diminished. Best seen on  axial FLAIR images (series 8), there is new mild circumferential  dural thickening (series 8, image 15). No contrast was administered.  No restricted diffusion to suggest acute infarction. No midline  shift, mass effect, evidence of mass lesion, ventriculomegaly  (incidental cavum septum pellucidum), extra-axial collection or  acute intracranial hemorrhage. Cervicomedullary junction and  pituitary are within normal limits. Major intracranial vascular flow  voids are stable.  Scattered small cerebral white matter T2 and FLAIR hyperintense foci  are stable since 2010, nonspecific, and overall mild for age. No  cortical encephalomalacia. Otherwise normal gray and white matter  signal.  Visualized orbit soft tissues are within normal limits. Visualized  paranasal sinuses and mastoids are clear. Normal bone marrow signal.  Visualized scalp soft tissues are within normal limits.  IMPRESSION:  1. Constellation of findings most compatible with intracranial  hypotension. CSF opening pressure would confirm.  2. No other acute intracranial abnormality. Stable mild for age  nonspecific cerebral white matter changes since 2010.   MDM   Final diagnoses:  Headache  Abnormal brain MRI   Patient presented the ED for increased frequency of headaches. Patient has a chronic history of migraine headaches and, however she states these have been better the last 2 years only occurring one to 2 times a year. She was started on Effexor 37.5 at the beginning of February. Since then she's had  increase frequency in her headaches, she was seen at her PCPs office that felt her headaches were increased from the medication she was on started her on Effexor taper and was told to stop all medications except for Synthroid. She was then started on indomethacin. An MRI was obtained on the 26th that was abnormal. During ED admission patient was given IV fluids, Dilaudid, Zofran and Benadryl for headache. Patient improved greatly with medication treatment, pain 4/10. Neurology was consulted, Dr. Regenia Skeeter evaluated patient and felt she was stable, but would like Neurosurgery to see her prior to discharge concerning MRI and possible management outpatient. Neurosurgery consulted, and felt she was stable for discharge with outpatient neurology follow up. Nortriptyline increased to 171m. Pt encouraged tof/u with PCP Monday.    RMa Hillock DO 10/17/13 [id number]

## 2013-10-15 NOTE — ED Notes (Signed)
Dr. Claiborne BillingsKuneff at bedside

## 2013-10-15 NOTE — Consult Note (Signed)
Reason for Consult:Headache and abnormal MRI Referring Physician: Radford Pax  CC: Headache  HPI: Melissa Kirby is an 53 y.o. female with a history of migraines who reports that since February she has had headaches that are different.  They are on the top of her head.  Described as sharp and burning.  They are not associated with visual complaints, numbness, weakness, photophobia, phonophobia, nausea or vomiting.  These headaches have increased in severity and frequency since February.  They now occur at a frequency of about 5-6 per day.  They may last from an hour up to 3-4 hours.  Nothing seems to make them better or worse. They resolve spontaneously.  She has been seen by her outpatient PCP and was recently referred to Neurology but that appointment has not occurred. Awakened this morning with a severe headache that she could not get rid of and presented for evaluation.  Has been treated with improvement in her pain to a 4/10.   Has had an outpatient MRI in work up.   Patient was taken off of her Effexor three days ago.  Has not taken her Butalbitol for 4-5 days.  Prior to took 2 a day at most and did not take daily.   Patient has been unable to work.  Past Medical History  Diagnosis Date  . VITAMIN B12 DEFICIENCY 09/2009 dx  . Unspecified hypothyroidism 1982/2000    surgical resection of benign growth - partial then complete  . GERD (gastroesophageal reflux disease)     per endo. - does not take any meds.    Marland Kitchen MIGRAINE HEADACHE     last Migraine 06/29/2011-   . ANEMIA, IRON DEFICIENCY   . Kidney stones 1998, 2005    x 2  . Depression     therapy  . Fibromyalgia 1999    no meds    Past Surgical History  Procedure Laterality Date  . Total thyroidectomy  2000    Had thyroid nodule, nonmalignant ([id number]) resection  . Cholecystectomy  2009  . Tubal ligation  1989  . Diagnostic laparoscopy  1982    diagnostic  . Thyroid surgery  1983    partial   . Posterior fusion lumbar spine   06/2011    Family History  Problem Relation Age of Onset  . Hyperlipidemia Mother   . Heart disease Mother   . Hyperlipidemia Father   . Breast cancer Maternal Grandmother   . Hyperlipidemia Maternal Grandmother   . Ovarian cancer Other   . Anesthesia problems Neg Hx   . Malignant hyperthermia Neg Hx   . Pseudochol deficiency Neg Hx   . Hypotension Neg Hx     Social History:  reports that she has never smoked. She does not have any smokeless tobacco history on file. She reports that she does not drink alcohol or use illicit drugs.  Allergies  Allergen Reactions  . Compazine Anaphylaxis    Hypotension  . Gluten Meal     Vitamin B irregulaties  . Metoclopramide Hcl Anaphylaxis    Hypotension  . Morphine And Related     nausea    Medications: I have reviewed the patient's current medications. Prior to Admission:  Current outpatient prescriptions:Cyanocobalamin (VITAMIN B-12) 1000 MCG SUBL, Place 1 tablet (1,000 mcg total) under the tongue daily., Disp: 100 tablet, Rfl: 3;  diphenhydrAMINE (BENADRYL) 25 MG tablet, Take 50 mg by mouth every 6 (six) hours as needed for itching., Disp: , Rfl: ;  ergocalciferol (VITAMIN D2) 50000 UNITS capsule,  Take 1 capsule (50,000 Units total) by mouth once a week., Disp: 6 capsule, Rfl: 0 indomethacin (INDOCIN) 25 MG capsule, Take 1 capsule (25 mg total) by mouth 2 (two) times daily with a meal., Disp: 60 capsule, Rfl: 1;  levothyroxine (SYNTHROID, LEVOTHROID) 175 MCG tablet, Take 175 mcg by mouth daily before breakfast., Disp: , Rfl: ;  methocarbamol (ROBAXIN-750) 750 MG tablet, Take 1 tablet (750 mg total) by mouth every 8 (eight) hours as needed for muscle spasms., Disp: 30 tablet, Rfl: 1 Multiple Vitamin (MULTIVITAMIN) tablet, Take 1 tablet by mouth daily.  , Disp: , Rfl: ;  nortriptyline (PAMELOR) 75 MG capsule, Take 75 mg by mouth at bedtime., Disp: , Rfl: ;  venlafaxine XR (EFFEXOR XR) 37.5 MG 24 hr capsule, Take 1 capsule (37.5 mg total) by  mouth daily., Disp: 30 capsule, Rfl: 2  ROS: History obtained from the patient  General ROS: negative for - chills, fatigue, fever, night sweats, weight gain or weight loss Psychological ROS: negative for - behavioral disorder, hallucinations, memory difficulties, mood swings or suicidal ideation Ophthalmic ROS: negative for - blurry vision, double vision, eye pain or loss of vision ENT ROS: negative for - epistaxis, nasal discharge, oral lesions, sore throat, tinnitus or vertigo Allergy and Immunology ROS: negative for - hives or itchy/watery eyes Hematological and Lymphatic ROS: negative for - bleeding problems, bruising or swollen lymph nodes Endocrine ROS: negative for - galactorrhea, hair pattern changes, polydipsia/polyuria or temperature intolerance Respiratory ROS: negative for - cough, hemoptysis, shortness of breath or wheezing Cardiovascular ROS: negative for - chest pain, dyspnea on exertion, edema or irregular heartbeat Gastrointestinal ROS: negative for - abdominal pain, diarrhea, hematemesis, nausea/vomiting or stool incontinence Genito-Urinary ROS: negative for - dysuria, hematuria, incontinence or urinary frequency/urgency Musculoskeletal ROS: negative for - joint swelling or muscular weakness Neurological ROS: as noted in HPI Dermatological ROS: negative for rash and skin lesion changes  Physical Examination: Blood pressure 134/87, pulse 88, temperature 98.1 F (36.7 C), temperature source Oral, resp. rate 18, height 5\' 3"  (1.6 m), weight 63.957 kg (141 lb), SpO2 100.00%.  Neurologic Examination Mental Status: Alert, oriented, thought content appropriate.  Speech fluent without evidence of aphasia.  Able to follow 3 step commands without difficulty. Cranial Nerves: II: Discs flat bilaterally; Visual fields grossly normal, pupils equal, round, reactive to light and accommodation III,IV, VI: ptosis not present, extra-ocular motions intact bilaterally V,VII: smile  symmetric, facial light touch sensation normal bilaterally VIII: hearing normal bilaterally IX,X: gag reflex present XI: bilateral shoulder shrug XII: midline tongue extension Motor: Right : Upper extremity   5/5    Left:     Upper extremity   5/5  Lower extremity   5/5     Lower extremity   5/5 Tone and bulk:normal tone throughout; no atrophy noted Sensory: Pinprick and light touch intact throughout, bilaterally Deep Tendon Reflexes: 2+ and symmetric throughout Plantars: Right: downgoing   Left: downgoing Cerebellar: normal finger-to-nose, normal rapid alternating movements and normal heel-to-shin test Gait: normal gait and station CV: pulses palpable throughout     Laboratory Studies:   Basic Metabolic Panel:  Recent Labs Lab 10/13/13 0904  NA 136  K 4.4  CL 104  CO2 25  GLUCOSE 93  BUN 8  CREATININE 0.8  CALCIUM 9.5    Liver Function Tests:  Recent Labs Lab 10/13/13 0904  AST 24  ALT 18  ALKPHOS 165*  BILITOT 0.3  PROT 8.0  ALBUMIN 4.4   No results found for  this basename: LIPASE, AMYLASE,  in the last 168 hours No results found for this basename: AMMONIA,  in the last 168 hours  CBC:  Recent Labs Lab 10/13/13 0904  WBC 5.9  NEUTROABS 3.0  HGB 12.3  HCT 36.7  MCV 88.3  PLT 254.0    Cardiac Enzymes: No results found for this basename: CKTOTAL, CKMB, CKMBINDEX, TROPONINI,  in the last 168 hours  BNP: No components found with this basename: POCBNP,   CBG: No results found for this basename: GLUCAP,  in the last 168 hours  Microbiology: Results for orders placed during the hospital encounter of 07/04/11  SURGICAL PCR SCREEN     Status: None   Collection Time    07/04/11  9:42 AM      Result Value Ref Range Status   MRSA, PCR NEGATIVE  NEGATIVE Final   Staphylococcus aureus NEGATIVE  NEGATIVE Final   Comment:            The Xpert SA Assay (FDA     approved for NASAL specimens     only), is one component of     a comprehensive  surveillance     program.  It is not intended     to diagnose infection nor to     guide or monitor treatment.    Coagulation Studies: No results found for this basename: LABPROT, INR,  in the last 72 hours  Urinalysis:  Recent Labs Lab 10/13/13 0904  COLORURINE YELLOW  LABSPEC <=1.005*  PHURINE 6.0  GLUCOSEU NEGATIVE  HGBUR NEGATIVE  BILIRUBINUR NEGATIVE  KETONESUR NEGATIVE  UROBILINOGEN 0.2  NITRITE NEGATIVE  LEUKOCYTESUR SMALL*    Lipid Panel:     Component Value Date/Time   CHOL 214* 06/29/2013 1051   TRIG 118.0 06/29/2013 1051   HDL 55.80 06/29/2013 1051   CHOLHDL 4 06/29/2013 1051   VLDL 23.6 06/29/2013 1051   LDLCALC 93 10/03/2009 1608    HgbA1C:  No results found for this basename: HGBA1C    Urine Drug Screen:   No results found for this basename: labopia, cocainscrnur, labbenz, amphetmu, thcu, labbarb    Alcohol Level: No results found for this basename: ETH,  in the last 168 hours  Imaging: No results found.   Assessment/Plan: 53 year old female with worsening headaches.  MRI of the brain performed on 3/26 and reviewed this evening.  There is displacement of the brainstem and cerebellum inferiorly with flattening of the pons.  This is new from her previous MR of 2012.  There are also some nonspecific T2 white matter lesions.  Headache at this time is significantly improved.  Headache may be seen with low lying cerebral structures and therefore would recommend further evaluation. It should also be noted that TSH and B12 are low which may also contribute to headaches.  Recommendations: 1.  Patient to increase Pamelor to 100mg  at night 2.  May continue Indomethacin prn 3.  Neurosurgery to review films and make recommendations for their need of involvement 4.  LP not indicated at this time 5.  Patient to keep outpatient appointment with neurology  Case discussed with Dr. Henreitta LeberBeaton  Morse Brueggemann, MD [facility] [phone] 10/15/2013,  8:39 PM

## 2013-10-15 NOTE — ED Notes (Signed)
Pt reports having headaches since feb. Had MRI done on Thursday. They think possible reaction to med she is taking, which they are weaning her off. Pt reports severe headache since 0600, denies n/v.

## 2013-10-17 ENCOUNTER — Other Ambulatory Visit: Payer: Self-pay

## 2013-10-26 ENCOUNTER — Encounter: Payer: Self-pay | Admitting: *Deleted

## 2013-10-26 ENCOUNTER — Encounter: Payer: Self-pay | Admitting: Internal Medicine

## 2013-10-26 ENCOUNTER — Ambulatory Visit (INDEPENDENT_AMBULATORY_CARE_PROVIDER_SITE_OTHER): Payer: 59 | Admitting: Internal Medicine

## 2013-10-26 VITALS — BP 120/88 | HR 105 | Temp 98.4°F | Wt 137.8 lb

## 2013-10-26 DIAGNOSIS — F32A Depression, unspecified: Secondary | ICD-10-CM

## 2013-10-26 DIAGNOSIS — F3289 Other specified depressive episodes: Secondary | ICD-10-CM

## 2013-10-26 DIAGNOSIS — R930 Abnormal findings on diagnostic imaging of skull and head, not elsewhere classified: Secondary | ICD-10-CM

## 2013-10-26 DIAGNOSIS — R9089 Other abnormal findings on diagnostic imaging of central nervous system: Secondary | ICD-10-CM

## 2013-10-26 DIAGNOSIS — F329 Major depressive disorder, single episode, unspecified: Secondary | ICD-10-CM

## 2013-10-26 NOTE — Progress Notes (Signed)
Pre visit review using our clinic review tool, if applicable. No additional management support is needed unless otherwise documented below in the visit note. 

## 2013-10-26 NOTE — Progress Notes (Signed)
Subjective:    Patient ID: Melissa Kirby, female    DOB: 11/29/1960, 53 y.o.   MRN: [id number]  HPI  Patient here today for follow up of migraines.  Was seen in the ER 10/13/13 - MRI and neurology consult done at that time with recommendations to refer to neurosurgery (not yet done).  TSH noted to be low (no FT4 done yet), Vit D level also low.   Pt on increased dose of Nortriptyline and Indocin with improvement in symptoms.  Effexor also discontinued - pt reports mood stable.  Sleeping ok at night.  Still not working due to persistent headaches.  No visual changes, no focal neuro defects.   Requesting referral for marriage counseling also.    Past Medical History  Diagnosis Date  . VITAMIN B12 DEFICIENCY 09/2009 dx  . Unspecified hypothyroidism 1982/2000    surgical resection of benign growth - partial then complete  . GERD (gastroesophageal reflux disease)     per endo. - does not take any meds.    Marland Kitchen MIGRAINE HEADACHE     last Migraine 06/29/2011-   . ANEMIA, IRON DEFICIENCY   . Kidney stones 1998, 2005    x 2  . Depression     therapy  . Fibromyalgia 1999    no meds    Review of Systems  Constitutional: Negative for fever, chills, activity change and appetite change.  Neurological: Positive for headaches (persistent). Negative for dizziness, seizures, syncope and weakness.       Objective:   Physical Exam  Constitutional: She is oriented to person, place, and time. She appears well-developed and well-nourished. No distress.  Cardiovascular: Regular rhythm and normal heart sounds.  Tachycardia present.   No murmur heard. Pulmonary/Chest: Effort normal and breath sounds normal. No respiratory distress. She has no wheezes. She has no rales.  Neurological: She is alert and oriented to person, place, and time.  Skin: Skin is warm and dry. No rash noted. She is not diaphoretic. No erythema.  Psychiatric: She has a normal mood and affect. Her behavior is normal. Judgment and  thought content normal.    Wt Readings from Last 3 Encounters:  10/26/13 137 lb 12.8 oz (62.506 kg)  10/15/13 141 lb (63.957 kg)  10/13/13 139 lb (63.05 kg)    Lab Results  Component Value Date   WBC 5.9 10/13/2013   HGB 12.3 10/13/2013   HCT 36.7 10/13/2013   PLT 254.0 10/13/2013   GLUCOSE 93 10/13/2013   CHOL 214* 06/29/2013   TRIG 118.0 06/29/2013   HDL 55.80 06/29/2013   LDLDIRECT 145.1 06/29/2013   LDLCALC 93 10/03/2009   ALT 18 10/13/2013   AST 24 10/13/2013   NA 136 10/13/2013   K 4.4 10/13/2013   CL 104 10/13/2013   CREATININE 0.8 10/13/2013   BUN 8 10/13/2013   CO2 25 10/13/2013   TSH 0.19* 10/13/2013   INR 1.02 07/04/2011       Assessment & Plan:   Problem List Items Addressed This Visit   Abnormal brain MRI - Primary     10/13/13 MRI reviewed: IMPRESSION:  1. Constellation of findings most compatible with intracranial  hypotension. CSF opening pressure would confirm.  2. No other acute intracranial abnormality. Stable mild for age  nonspecific cerebral white matter changes since 2010.   ER eval 3/28 for continue HA - neuro eval done and reviewed -  recommended NSurg eval as OP - will refer today  continue TCA and remain out of  work until further action plan developed (last day 3/24 in office, out until 4/9 per AVP OV 3/26 - will extend to 11/26/13)    Relevant Orders      Ambulatory referral to Neurosurgery   Depression     Increase symptoms following unexpected death of dad 06/2013 (thanksgiving day) Resumed SNRI 06/2013 as previously helpful in 2012 (venlafex 37.5) - stopped 09/2013 because of change HA symptoms (see above) We feel it is unlikely SSRI therapy has caused MRI brain changes, but agree the patient should remain off SNRI at this time ongoing counseling with Judithe ModestSusan Bond increased pamelor 12/2011 and 04/2013 to help with insomnia, titrate up per neuro for HA symptoms - continue same also reviewed ongoing marital stressors: patient has left her husband and  as begun separation process. Will refer to marriage counseling at patient request separate of her independent counseling ongoing     Relevant Orders      Ambulatory referral to Psychology

## 2013-10-26 NOTE — Assessment & Plan Note (Signed)
10/13/13 MRI reviewed: IMPRESSION:  1. Constellation of findings most compatible with intracranial  hypotension. CSF opening pressure would confirm.  2. No other acute intracranial abnormality. Stable mild for age  nonspecific cerebral white matter changes since 2010.   ER eval 3/28 for continue HA - neuro eval done and reviewed -  recommended NSurg eval as OP - will refer today  continue TCA and remain out of work until further action plan developed (last day 3/24 in office, out until 4/9 per AVP OV 3/26 - will extend to 11/26/13)

## 2013-10-26 NOTE — Patient Instructions (Signed)
It was good to see you today.  We have reviewed your prior records including labs and tests today  Medications reviewed and updated, no changes recommended at this time.  we'll make referral to neurosurgery because of your abnormal MRI and headaches. Also to marriage counselor as requested. Our office will contact you regarding appointment(s) once made.  Work note provided to you today. We have extended your medical leave from April 9 through May 9.  Please schedule followup in 4 weeks, call sooner if problems.

## 2013-10-26 NOTE — Assessment & Plan Note (Addendum)
Increase symptoms following unexpected death of dad 06/2013 (thanksgiving day) Resumed SNRI 06/2013 as previously helpful in 2012 (venlafex 37.5) - stopped 09/2013 because of change HA symptoms (see above) We feel it is unlikely SSRI therapy has caused MRI brain changes, but agree the patient should remain off SNRI at this time ongoing counseling with Judithe ModestSusan Bond increased pamelor 12/2011 and 04/2013 to help with insomnia, titrate up per neuro for HA symptoms - continue same also reviewed ongoing marital stressors: patient has left her husband and as begun separation process. Will refer to marriage counseling at patient request separate of her independent counseling ongoing

## 2013-11-03 ENCOUNTER — Encounter: Payer: Self-pay | Admitting: Neurology

## 2013-11-03 ENCOUNTER — Ambulatory Visit (INDEPENDENT_AMBULATORY_CARE_PROVIDER_SITE_OTHER): Payer: 59 | Admitting: Neurology

## 2013-11-03 VITALS — BP 102/70 | HR 68 | Temp 98.7°F | Resp 16 | Ht 64.0 in | Wt 136.7 lb

## 2013-11-03 DIAGNOSIS — R930 Abnormal findings on diagnostic imaging of skull and head, not elsewhere classified: Secondary | ICD-10-CM

## 2013-11-03 DIAGNOSIS — R9089 Other abnormal findings on diagnostic imaging of central nervous system: Secondary | ICD-10-CM

## 2013-11-03 DIAGNOSIS — G43109 Migraine with aura, not intractable, without status migrainosus: Secondary | ICD-10-CM

## 2013-11-03 DIAGNOSIS — R519 Headache, unspecified: Secondary | ICD-10-CM

## 2013-11-03 DIAGNOSIS — R51 Headache: Secondary | ICD-10-CM

## 2013-11-03 NOTE — Progress Notes (Signed)
NEUROLOGY CONSULTATION NOTE  Melissa Kirby MRN: [id number] DOB: 01/31/1961  Referring provider: Dr. Alain Marion Primary care provider: Dr. Asa Lente  Reason for consult:  Headache  HISTORY OF PRESENT ILLNESS: Melissa Kirby is a 53 year old right-handed woman with history of migraine, B12 deficiency, hypothyroidism, kidney stones, depression and fibromyalgia who presents for headache.  Notes and MRI of brain from 2015 and 2010 personally reviewed.  She is accompanied by her husband.  Onset:  January-February 2015, coinciding with initiation of Effexor Location:  Front to top of head Quality:  Stabbing with constant pressure Intensity:  Initially 10/10, now 4-5/10 since discontinuing Effexor Aura:  No Associated symptoms:  Slight horizontal double vision.  No nausea, vomiting, facial numbness, syncope.  Has chronic tingling in hands and feet related to fibromyalgia. Duration:  Initially up to 24 hours, now 30-60 minutes since discontinuation of Effexor Frequency:  Several times a day Triggers/exacerbating factors:  Change in position, such as standing up right after having bent forward, coughing, sneezing, Valsalva Relieving factors:  Laying supine helps, resting, sleeping Activity:  Cannot function.  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  Off work at this time. Past abortive medication:  Promethazine with codeine (ineffective).  Now taking nothing Current preventative medication:  Had been on nortriptyline 56m for many years.  In March 2015, increased to 1081m MRI of the brain was performed on 10/13/13, which revealed inferior displacement of the cerebellum and brainstem, with flattening of the ventral pons and diminished suprasellar cistern volume, as well as mild circumferential dural thickening, findings which may be seen in intracranial hypotension.  This was new compared to prior scan in 2010.  She presented to the ED on 10/15/13 with worsening  headache.  She was given IV fluids, Dilaudid, and Benadryl, which improved the pain to 4/10.  She was evaluated by both neurology and neurosurgery, and was stable for discharge.  Effexor was tapered off and nortriptyline was increased to 10048mnd indomethacin.  10/13/13 LABS:  TSH 0.19, B12 281, Vit D 25 hydroxy 18, ESR 35  Typical Migraines: Onset:  Since adolescence Location:  From back of head up to top Quality:  pounding Intensity:  10/10 Aura:  Halo effect just before onset of headache Associated symptoms:  Nausea, vomiting, photophobia, phonophobia, osmophobia Duration:  12-13 hours (one time it lasted 13 days), followed by scalp sensitivity the next day. Frequency:  Once every other month Triggers/exacerbating factors:  None Relieving factors:  Sleep.  Frequency improved following gluten-free diet. Activity:  Cannot function  Past abortive therapy:  Tylenol (ineffective), Imitrex (ineffective), Maxalt (ineffective), trigger point injections (ineffective), nasal spray (ineffective), Excedrin (ineffective), biofreeze (ineffective). Past preventative therapy:  topamax (ineffective)  Current abortive therapy:  ibuprofen Current preventative therapy:  Nortriptyline 100m15maffeine:  Was drinking Dr. PeppMalachi Bondsday.  Now trying to cut back Depression/stress:  She has had significant increase in depression over the past several months.  Her father passed away unexpectedly on Thanksgiving.  She has had marital difficulties.  She left her husband but is now in marriage counseling. Sleep hygiene:  Sleeps better but not rested.  PAST MEDICAL HISTORY: Past Medical History  Diagnosis Date  . VITAMIN B12 DEFICIENCY 09/2009 dx  . Unspecified hypothyroidism 1982/2000    surgical resection of benign growth - partial then complete  . GERD (gastroesophageal reflux disease)     per endo. - does not take any meds.    .Marland Kitchen  MIGRAINE HEADACHE     last Migraine 06/29/2011-   . ANEMIA, IRON DEFICIENCY     . Kidney stones 1998, 2005    x 2  . Depression     therapy  . Fibromyalgia 1999    no meds    PAST SURGICAL HISTORY: Past Surgical History  Procedure Laterality Date  . Total thyroidectomy  2000    Had thyroid nodule, nonmalignant ([id number]) resection  . Cholecystectomy  2009  . Tubal ligation  1989  . Diagnostic laparoscopy  1982    diagnostic  . Thyroid surgery  1983    partial   . Posterior fusion lumbar spine  06/2011    MEDICATIONS: Current Outpatient Prescriptions on File Prior to Visit  Medication Sig Dispense Refill  . Cyanocobalamin (VITAMIN B-12) 1000 MCG SUBL Place 1 tablet (1,000 mcg total) under the tongue daily.  100 tablet  3  . ergocalciferol (VITAMIN D2) 50000 UNITS capsule Take 1 capsule (50,000 Units total) by mouth once a week.  6 capsule  0  . indomethacin (INDOCIN) 25 MG capsule Take 1 capsule (25 mg total) by mouth 2 (two) times daily with a meal.  60 capsule  1  . levothyroxine (SYNTHROID, LEVOTHROID) 175 MCG tablet Take 175 mcg by mouth daily before breakfast.      . Multiple Vitamin (MULTIVITAMIN) tablet Take 1 tablet by mouth daily.        . nortriptyline (PAMELOR) 50 MG capsule Take 2 capsules (100 mg total) by mouth at bedtime.  60 capsule  0   No current facility-administered medications on file prior to visit.    ALLERGIES: Allergies  Allergen Reactions  . Compazine Anaphylaxis    Hypotension  . Gluten Meal     Vitamin B irregulaties  . Metoclopramide Hcl Anaphylaxis    Hypotension  . Morphine And Related     nausea    FAMILY HISTORY: Family History  Problem Relation Age of Onset  . Hyperlipidemia Mother   . Heart disease Mother   . Hyperlipidemia Father   . Breast cancer Maternal Grandmother   . Hyperlipidemia Maternal Grandmother   . Ovarian cancer Other   . Anesthesia problems Neg Hx   . Malignant hyperthermia Neg Hx   . Pseudochol deficiency Neg Hx   . Hypotension Neg Hx     SOCIAL HISTORY: History   Social  History  . Marital Status: Married    Spouse Name: N/A    Number of Children: N/A  . Years of Education: N/A   Occupational History  . Not on file.   Social History Main Topics  . Smoking status: Never Smoker   . Smokeless tobacco: Not on file     Comment: Married, lives with spouse. 3 children. 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 Alcohol Use: No  . Drug Use: No  . Sexual Activity: Yes    Birth Control/ Protection: Surgical     Comment: Married lives with spouse, 3 children. 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 Not on file   Social History Narrative  . No narrative on file    REVIEW OF SYSTEMS: Constitutional: No fevers, chills, or sweats, no generalized fatigue, change in appetite Eyes: No visual changes, double vision, eye pain Ear, nose and throat: No hearing loss, ear pain, nasal congestion, sore throat Cardiovascular: No chest pain, palpitations Respiratory:  No shortness of breath at rest or with exertion, wheezes GastrointestinaI: No  nausea, vomiting, diarrhea, abdominal pain, fecal incontinence Genitourinary:  No dysuria, urinary retention or frequency Musculoskeletal:  No neck pain, back pain Integumentary: No rash, pruritus, skin lesions Neurological: as above Psychiatric: No depression, insomnia, anxiety Endocrine: No palpitations, fatigue, diaphoresis, mood swings, change in appetite, change in weight, increased thirst Hematologic/Lymphatic:  No anemia, purpura, petechiae. Allergic/Immunologic: no itchy/runny eyes, nasal congestion, recent allergic reactions, rashes  PHYSICAL EXAM: Filed Vitals:   11/03/13 0937  BP: 102/70  Pulse: 68  Temp: 98.7 F (37.1 C)  Resp: 16   General: No acute distress Head:  Normocephalic/atraumatic Neck: supple, no paraspinal tenderness, full range of motion Back: No paraspinal tenderness Heart: regular rate and rhythm Lungs: Clear to auscultation  bilaterally. Vascular: No carotid bruits. Neurological Exam: Mental status: alert and oriented to person, place, and time, recent and remote memory intact, fund of knowledge intact, attention and concentration intact, speech fluent and not dysarthric, language intact. Cranial nerves: CN I: not tested CN II: pupils equal, round and reactive to light, visual fields intact, fundi unremarkable, without vessel changes, exudates, hemorrhages or papilledema. CN III, IV, VI:  full range of motion, no nystagmus, no ptosis CN V: facial sensation intact CN VII: upper and lower face symmetric CN VIII: hearing intact CN IX, X: gag intact, uvula midline CN XI: sternocleidomastoid and trapezius muscles intact CN XII: tongue midline Bulk & Tone: normal, no fasciculations. Motor: 5/5 throughout Sensation: temperature and vibration intact. Deep Tendon Reflexes: 2+ throughout, toes down Finger to nose testing: no dysmetria Heel to shin: no dysmetria Gait: normal station and stride.  Able to turn and walk in tandem. Romberg negative.  IMPRESSION: New headaches.  Given co-symptomatology and MRI results, suspect spontaneous intracranial hypotension Migraine with visual aura  PLAN: 1.  Will get LP to evaluate CSF for opening pressure, cell count, protein, glucose and culture. 2.  If opening pressure is low, will order MRI of C/T/L spine with and without contrast to look for source of leak.  Consider CT myelogram if MRI not conclusive. 3.  If opening pressure is low, would recommend blood patch, increase salt, caffeine, hydration intake. 4.  Follow up after LP.  45 minutes spent with patient, over 50% spent counseling and coordinating care.  Thank you for allowing me to take part in the care of this patient.  Metta Clines, DO  CC:  Gwendolyn Grant, MD  Lew Dawes, MD

## 2013-11-03 NOTE — Patient Instructions (Signed)
Headache may be due  Low pressure in the head due to a leak of spinal fluid.    1.  We will get spinal tap to check the spinal fluid pressure 2.  If the pressure is low, we will get MRI of the spine to look for location of leak.  If the MRI is unremarkable, I would like to do a CT myelogram to look for a leak 3.  If low pressure is confirmed, I would recommend blood patch. 4.  Follow up right after spinal tap to discuss the next step.

## 2013-11-03 NOTE — ED Provider Notes (Signed)
I saw and evaluated the patient, reviewed the resident's note and I agree with the findings and plan.   .Face to face Exam:  General:  Awake HEENT:  Atraumatic Resp:  Normal effort Abd:  Nondistended Neuro:No focal weakness    Nelia Shiobert L Sheanna Dail, MD 11/03/13 [phone]

## 2013-11-07 ENCOUNTER — Telehealth: Payer: Self-pay | Admitting: *Deleted

## 2013-11-07 NOTE — Telephone Encounter (Signed)
I spoke with patient she is aware that after one visit DrJaffe will not fill out FMLA forms

## 2013-11-11 ENCOUNTER — Other Ambulatory Visit: Payer: Self-pay | Admitting: Radiology

## 2013-11-14 ENCOUNTER — Ambulatory Visit (HOSPITAL_COMMUNITY)
Admission: RE | Admit: 2013-11-14 | Discharge: 2013-11-14 | Disposition: A | Discharge status: Home / Self Care | Payer: 59 | Source: Ambulatory Visit | Attending: Neurology | Admitting: Neurology

## 2013-11-14 ENCOUNTER — Encounter (HOSPITAL_COMMUNITY): Payer: Self-pay | Admitting: Pharmacy Technician

## 2013-11-14 DIAGNOSIS — R9089 Other abnormal findings on diagnostic imaging of central nervous system: Secondary | ICD-10-CM

## 2013-11-14 DIAGNOSIS — R51 Headache: Secondary | ICD-10-CM

## 2013-11-14 LAB — CSF CELL COUNT WITH DIFFERENTIAL
RBC Count, CSF: 2 /mm3 — ABNORMAL HIGH
Tube #: 3
WBC, CSF: 6 /mm3 — ABNORMAL HIGH (ref 0–5)

## 2013-11-14 LAB — GRAM STAIN

## 2013-11-14 LAB — PROTEIN, CSF: Total  Protein, CSF: 35 mg/dL (ref 15–45)

## 2013-11-14 LAB — GLUCOSE, CSF: Glucose, CSF: 58 mg/dL (ref 43–76)

## 2013-11-14 MED ORDER — ACETAMINOPHEN 500 MG PO TABS
1000.0000 mg | ORAL_TABLET | Freq: Four times a day (QID) | ORAL | Status: DC | PRN
  Filled 2013-11-14: qty 2

## 2013-11-14 NOTE — Discharge Instructions (Signed)
Myelogram and Lumbar Puncture Discharge Instructions  1. Go home and rest quietly for the next 24 hours.  It is important to lie flat for the next 24 hours.  Get up only to go to the restroom.  You may lie in the bed or on a couch on your back, your stomach, your left side or your right side.  You may have one pillow under your head.  You may have pillows between your knees while you are on your side or under your knees while you are on your back.  2. DO NOT drive today.  Recline the seat as far back as it will go, while still wearing your seat belt, on the way home.  3. You may get up to go to the bathroom as needed.  You may sit up for 10 minutes to eat.  You may resume your normal diet and medications unless otherwise indicated.  4. The incidence of headache, nausea, or vomiting is about 5% (one in 20 patients).  If you develop a headache, lie flat and drink plenty of fluids until the headache goes away.  Caffeinated beverages may be helpful.  If you develop severe nausea and vomiting or a headache that does not go away with flat bed rest, call MD.  5. You may resume normal activities after your 24 hours of bed rest is over; however, do not exert yourself strongly or do any heavy lifting tomorrow.  6. Call your physician for a follow-up appointment.  The results of your myelogram will be sent directly to your physician by the following day.  7. If you have any questions or if complications develop after you arrive home, please call MD.  Discharge instructions have been explained to the patient.  The patient, or the person responsible for the patient, fully understands these instructions.   

## 2013-11-15 ENCOUNTER — Other Ambulatory Visit: Payer: Self-pay | Admitting: *Deleted

## 2013-11-15 ENCOUNTER — Telehealth: Payer: Self-pay | Admitting: Neurology

## 2013-11-15 ENCOUNTER — Telehealth: Payer: Self-pay | Admitting: *Deleted

## 2013-11-15 DIAGNOSIS — R51 Headache: Secondary | ICD-10-CM

## 2013-11-15 NOTE — Telephone Encounter (Signed)
Please see below have you seen results of LP ? And advise

## 2013-11-15 NOTE — Telephone Encounter (Signed)
Looked at the results.  No abnormalities in the spinal fluid.  The pressure was borderline low.  I would like to go through with MRI of the cervical, thoracic and lumbar spines with and without contrast to evaluate for low-pressure headache.

## 2013-11-15 NOTE — Telephone Encounter (Signed)
Pt had LP yesterday. Needs follow up but does not want to wait until first available in 2 weeks. Wants sooner appt as she thinks Dr. Jaffe will oEverlena Cooperrder a MRI next. Please call [phone]. Sherri

## 2013-11-15 NOTE — Telephone Encounter (Signed)
Patient will have MRI  Lspine C spine and T spine with and without on 11/23/13 at [facility]  645pm she is aware  Of day and time

## 2013-11-17 ENCOUNTER — Ambulatory Visit: Payer: 59 | Admitting: Psychology

## 2013-11-17 LAB — CSF CULTURE W GRAM STAIN: Culture: NO GROWTH

## 2013-11-17 LAB — CSF CULTURE

## 2013-11-18 ENCOUNTER — Other Ambulatory Visit: Payer: Self-pay | Admitting: *Deleted

## 2013-11-18 ENCOUNTER — Telehealth: Payer: Self-pay | Admitting: Neurology

## 2013-11-18 NOTE — Telephone Encounter (Signed)
Calling regarding a work note, please call [phone] / Sherri S.

## 2013-11-23 ENCOUNTER — Ambulatory Visit (HOSPITAL_COMMUNITY)
Admission: RE | Admit: 2013-11-23 | Discharge: 2013-11-23 | Disposition: A | Discharge status: Home / Self Care | Payer: 59 | Source: Ambulatory Visit | Attending: Neurology | Admitting: Neurology

## 2013-11-23 ENCOUNTER — Ambulatory Visit (HOSPITAL_COMMUNITY): Payer: 59

## 2013-11-23 DIAGNOSIS — Z981 Arthrodesis status: Secondary | ICD-10-CM | POA: Insufficient documentation

## 2013-11-23 DIAGNOSIS — R51 Headache: Secondary | ICD-10-CM | POA: Insufficient documentation

## 2013-11-23 MED ORDER — GADOBENATE DIMEGLUMINE 529 MG/ML IV SOLN
12.0000 mL | Freq: Once | INTRAVENOUS | Status: AC | PRN
  Administered 2013-11-23: 12 mL via INTRAVENOUS

## 2013-11-24 ENCOUNTER — Encounter: Payer: Self-pay | Admitting: Internal Medicine

## 2013-11-24 ENCOUNTER — Ambulatory Visit (INDEPENDENT_AMBULATORY_CARE_PROVIDER_SITE_OTHER): Payer: 59 | Admitting: Internal Medicine

## 2013-11-24 VITALS — BP 124/90 | HR 86 | Temp 98.6°F | Ht 63.0 in | Wt 136.8 lb

## 2013-11-24 DIAGNOSIS — M797 Fibromyalgia: Secondary | ICD-10-CM

## 2013-11-24 DIAGNOSIS — IMO0001 Reserved for inherently not codable concepts without codable children: Secondary | ICD-10-CM

## 2013-11-24 DIAGNOSIS — R930 Abnormal findings on diagnostic imaging of skull and head, not elsewhere classified: Secondary | ICD-10-CM

## 2013-11-24 DIAGNOSIS — G43909 Migraine, unspecified, not intractable, without status migrainosus: Secondary | ICD-10-CM

## 2013-11-24 DIAGNOSIS — R9089 Other abnormal findings on diagnostic imaging of central nervous system: Secondary | ICD-10-CM

## 2013-11-24 MED ORDER — NORTRIPTYLINE HCL 50 MG PO CAPS: 100.0000 mg | ORAL_CAPSULE | Freq: Every day | ORAL | Status: DC

## 2013-11-24 NOTE — Progress Notes (Signed)
Subjective:    Patient ID: Melissa Kirby, female    DOB: 05-06-1961, 53 y.o.   MRN: [id number]  HPI  Patient is here for follow up - headache Reviewed chronic medical issues and interval medical events  Past Medical History  Diagnosis Date  . VITAMIN B12 DEFICIENCY 09/2009 dx  . Unspecified hypothyroidism 1982/2000    surgical resection of benign growth - partial then complete  . GERD (gastroesophageal reflux disease)     per endo. - does not take any meds.    Marland Kitchen. MIGRAINE HEADACHE     last Migraine 06/29/2011-   . ANEMIA, IRON DEFICIENCY   . Kidney stones 1998, 2005    x 2  . Depression     therapy  . Fibromyalgia 1999    no meds    Review of Systems  Constitutional: Positive for fatigue. Negative for fever.  Eyes: Negative for pain and visual disturbance.  Respiratory: Negative for cough and shortness of breath.   Cardiovascular: Negative for chest pain and leg swelling.  Neurological: Positive for headaches (unchanged, positional). Negative for seizures and weakness.       Objective:   Physical Exam  BP 124/90  Pulse 86  Temp(Src) 98.6 F (37 C) (Oral)  Ht 5\' 3"  (1.6 m)  Wt 136 lb 12 oz (62.029 kg)  BMI 24.23 kg/m2  SpO2 97% Wt Readings from Last 3 Encounters:  11/24/13 136 lb 12 oz (62.029 kg)  11/14/13 136 lb (61.689 kg)  11/03/13 136 lb 11.2 oz (62.007 kg)   Constitutional: She appears fatigued, but well-developed and well-nourished. No distress. friend at side Neck: Normal range of motion. Neck supple. No JVD present. No thyromegaly present.  Cardiovascular: Normal rate, regular rhythm and normal heart sounds.  No murmur heard. No BLE edema. Pulmonary/Chest: Effort normal and breath sounds normal. No respiratory distress. She has no wheezes.  Neurologic: awake, alert, oriented x4. cranial nerves II through XII symmetrically intact. Speech, recall, balance and cognition is normal Psychiatric: She has a normal mood and affect. Her behavior is normal.  Judgment and thought content normal.   Lab Results  Component Value Date   WBC 5.9 10/13/2013   HGB 12.3 10/13/2013   HCT 36.7 10/13/2013   PLT 254.0 10/13/2013   GLUCOSE 93 10/13/2013   CHOL 214* 06/29/2013   TRIG 118.0 06/29/2013   HDL 55.80 06/29/2013   LDLDIRECT 145.1 06/29/2013   LDLCALC 93 10/03/2009   ALT 18 10/13/2013   AST 24 10/13/2013   NA 136 10/13/2013   K 4.4 10/13/2013   CL 104 10/13/2013   CREATININE 0.8 10/13/2013   BUN 8 10/13/2013   CO2 25 10/13/2013   TSH 0.19* 10/13/2013   INR 1.02 07/04/2011    Mr Cervical Spine W Wo Contrast  11/23/2013   CLINICAL DATA:  Headache. Intracranial hypotension. Abnormal brain MRI.  EXAM: MRI CERVICAL, THORACIC AND LUMBAR SPINE WITHOUT AND WITH CONTRAST  TECHNIQUE: Multiplanar and multiecho pulse sequences of the cervical spine, to include the craniocervical junction and cervicothoracic junction, and thoracic and lumbar spine, were obtained without and with intravenous contrast.  CONTRAST:  12mL MULTIHANCE GADOBENATE DIMEGLUMINE 529 MG/ML IV SOLN  COMPARISON:  MRI lumbar spine 12/03/2011.  MRI brain 10/13/2013.  FINDINGS: MRI CERVICAL SPINE FINDINGS  Normal signal is present in the cervical spinal cord. The cerebellar tonsils extend approximately 4 mm below the foramen magnum in the midline. The intracranial contents are unremarkable.  C2-3:  Negative.  C3-4:  Negative.  C4-5: A shallow central disc protrusion is present without significant stenosis.  C5-6: A mild broad-based disc osteophyte complex is present without significant stenosis.  C6-7:  Slight disc bulging is present without significant stenosis.  C7-T1:  Negative.  The postcontrast images demonstrate no pathologic enhancement.  MRI THORACIC SPINE FINDINGS  Normal signal is present throughout the spinal cord. The conus medullaris terminates at T12-L1, within normal limits. Schmorl's nodes are present at T6-7 and T8-9. Marrow signal, vertebral body heights, alignment are normal.  No significant  post disc protrusion is present. The foramina are patent bilaterally. The visualized paraspinal structures are within normal limits.  The postcontrast images demonstrate no pathologic enhancement.  MRI LUMBAR SPINE FINDINGS  Patient is status post PLIF at L4-5. Marrow signal, vertebral body heights, and alignment are otherwise normal. Limited imaging the abdomen is within normal limits.  The disc levels at L2-3 and above are normal.  L3-4: A shallow central disc protrusion is present. There is no significant stenosis.  L4-5: The patient is status post left laminectomy. No residual or recurrent disc protrusion or stenosis is evident.  L5-S1:  Negative.  The postcontrast images demonstrate enhancement at the surgical site of L4-5. No pathologic enhancement is evident.  IMPRESSION: 1. Minimal spondylosis and cervical spine without significant stenosis. 2. Cerebellar tonsillar ectopia is similar to slightly improved from the prior study, now measuring 4 mm. 3. Negative MRI of the thoracic spine. 4. Stable postsurgical changes of left laminectomy and fusion without residual or recurrent protrusion or stenosis at L4-5. 5. Shallow central disc protrusion at L3-4 without significant stenosis.   Electronically Signed   By: Gennette Pachris  Mattern M.D.   On: 11/23/2013 21:36   Mr Thoracic Spine W Wo Contrast  11/23/2013   CLINICAL DATA:  Headache. Intracranial hypotension. Abnormal brain MRI.  EXAM: MRI CERVICAL, THORACIC AND LUMBAR SPINE WITHOUT AND WITH CONTRAST  TECHNIQUE: Multiplanar and multiecho pulse sequences of the cervical spine, to include the craniocervical junction and cervicothoracic junction, and thoracic and lumbar spine, were obtained without and with intravenous contrast.  CONTRAST:  12mL MULTIHANCE GADOBENATE DIMEGLUMINE 529 MG/ML IV SOLN  COMPARISON:  MRI lumbar spine 12/03/2011.  MRI brain 10/13/2013.  FINDINGS: MRI CERVICAL SPINE FINDINGS  Normal signal is present in the cervical spinal cord. The cerebellar  tonsils extend approximately 4 mm below the foramen magnum in the midline. The intracranial contents are unremarkable.  C2-3:  Negative.  C3-4:  Negative.  C4-5: A shallow central disc protrusion is present without significant stenosis.  C5-6: A mild broad-based disc osteophyte complex is present without significant stenosis.  C6-7:  Slight disc bulging is present without significant stenosis.  C7-T1:  Negative.  The postcontrast images demonstrate no pathologic enhancement.  MRI THORACIC SPINE FINDINGS  Normal signal is present throughout the spinal cord. The conus medullaris terminates at T12-L1, within normal limits. Schmorl's nodes are present at T6-7 and T8-9. Marrow signal, vertebral body heights, alignment are normal.  No significant post disc protrusion is present. The foramina are patent bilaterally. The visualized paraspinal structures are within normal limits.  The postcontrast images demonstrate no pathologic enhancement.  MRI LUMBAR SPINE FINDINGS  Patient is status post PLIF at L4-5. Marrow signal, vertebral body heights, and alignment are otherwise normal. Limited imaging the abdomen is within normal limits.  The disc levels at L2-3 and above are normal.  L3-4: A shallow central disc protrusion is present. There is no significant stenosis.  L4-5: The patient is status post left laminectomy.  No residual or recurrent disc protrusion or stenosis is evident.  L5-S1:  Negative.  The postcontrast images demonstrate enhancement at the surgical site of L4-5. No pathologic enhancement is evident.  IMPRESSION: 1. Minimal spondylosis and cervical spine without significant stenosis. 2. Cerebellar tonsillar ectopia is similar to slightly improved from the prior study, now measuring 4 mm. 3. Negative MRI of the thoracic spine. 4. Stable postsurgical changes of left laminectomy and fusion without residual or recurrent protrusion or stenosis at L4-5. 5. Shallow central disc protrusion at L3-4 without significant  stenosis.   Electronically Signed   By: Gennette Pac M.D.   On: 11/23/2013 21:36   Mr Lumbar Spine W Wo Contrast  11/23/2013   CLINICAL DATA:  Headache. Intracranial hypotension. Abnormal brain MRI.  EXAM: MRI CERVICAL, THORACIC AND LUMBAR SPINE WITHOUT AND WITH CONTRAST  TECHNIQUE: Multiplanar and multiecho pulse sequences of the cervical spine, to include the craniocervical junction and cervicothoracic junction, and thoracic and lumbar spine, were obtained without and with intravenous contrast.  CONTRAST:  12mL MULTIHANCE GADOBENATE DIMEGLUMINE 529 MG/ML IV SOLN  COMPARISON:  MRI lumbar spine 12/03/2011.  MRI brain 10/13/2013.  FINDINGS: MRI CERVICAL SPINE FINDINGS  Normal signal is present in the cervical spinal cord. The cerebellar tonsils extend approximately 4 mm below the foramen magnum in the midline. The intracranial contents are unremarkable.  C2-3:  Negative.  C3-4:  Negative.  C4-5: A shallow central disc protrusion is present without significant stenosis.  C5-6: A mild broad-based disc osteophyte complex is present without significant stenosis.  C6-7:  Slight disc bulging is present without significant stenosis.  C7-T1:  Negative.  The postcontrast images demonstrate no pathologic enhancement.  MRI THORACIC SPINE FINDINGS  Normal signal is present throughout the spinal cord. The conus medullaris terminates at T12-L1, within normal limits. Schmorl's nodes are present at T6-7 and T8-9. Marrow signal, vertebral body heights, alignment are normal.  No significant post disc protrusion is present. The foramina are patent bilaterally. The visualized paraspinal structures are within normal limits.  The postcontrast images demonstrate no pathologic enhancement.  MRI LUMBAR SPINE FINDINGS  Patient is status post PLIF at L4-5. Marrow signal, vertebral body heights, and alignment are otherwise normal. Limited imaging the abdomen is within normal limits.  The disc levels at L2-3 and above are normal.  L3-4: A  shallow central disc protrusion is present. There is no significant stenosis.  L4-5: The patient is status post left laminectomy. No residual or recurrent disc protrusion or stenosis is evident.  L5-S1:  Negative.  The postcontrast images demonstrate enhancement at the surgical site of L4-5. No pathologic enhancement is evident.  IMPRESSION: 1. Minimal spondylosis and cervical spine without significant stenosis. 2. Cerebellar tonsillar ectopia is similar to slightly improved from the prior study, now measuring 4 mm. 3. Negative MRI of the thoracic spine. 4. Stable postsurgical changes of left laminectomy and fusion without residual or recurrent protrusion or stenosis at L4-5. 5. Shallow central disc protrusion at L3-4 without significant stenosis.   Electronically Signed   By: Gennette Pac M.D.   On: 11/23/2013 21:36       Assessment & Plan:   Problem List Items Addressed This Visit   Abnormal brain MRI     10/13/13 MRI reviewed: IMPRESSION:  1. Constellation of findings most compatible with intracranial  hypotension. CSF opening pressure would confirm.  2. No other acute intracranial abnormality. Stable mild for age  nonspecific cerebral white matter changes since 2010.   ER eval  3/28 for continue HA - "IP" Nsurg and neuro eval reviewed -  OP neuro workup ongoing - reviewed today  continue TCA and remain out of work until further action plan developed and symptoms improved (last day 3/24 in office, out until 4/9 per AVP OV 3/26 - extended to 11/26/13 at 10/26/13 OV - will extend another 30d now)     Fibromyalgia     s/p 01/20/12 appt with rheum PA > Referred to pain mgmt for same RLE improved s/p ESI 02/2012 increased pamelor (also for depression) 01/08/12 and again 04/2013 to help with insomnia/headache  Continue other meds as rx'd: NSAIDs (no longer on gabapentin or tramadol) reassurance provided, no other medication changes recommended at this time    MIGRAINE HEADACHE - Primary     Change  in symptoms early 2015 Ongoing workup and evaluation for same reviewed Abnormal MRI brain, work up by neurology ongoing for low pressure headache No indication for neurosurgical evaluation or intervention at this time followup with neurology as planned to review recent MRI spine (c, t, l) which appear largely unremarkable Continue amitriptyline -refill same provided today Will extend FMLA another 30 days pending further neuro input -employer to send forms for completion    Relevant Medications      nortriptyline (PAMELOR) capsule

## 2013-11-24 NOTE — Assessment & Plan Note (Signed)
Change in symptoms early 2015 Ongoing workup and evaluation for same reviewed Abnormal MRI brain, work up by neurology ongoing for low pressure headache No indication for neurosurgical evaluation or intervention at this time followup with neurology as planned to review recent MRI spine (c, t, l) which appear largely unremarkable Continue amitriptyline -refill same provided today Will extend FMLA another 30 days pending further neuro input -employer to send forms for completion

## 2013-11-24 NOTE — Patient Instructions (Signed)
It was good to see you today.  We have reviewed your prior records including labs and tests today  Medications reviewed and updated, no changes recommended at this time. Refill on medication(s) as discussed today.  Continue working with Dr Everlena CooperJaffe  We will extend your medical leave from April 9 through June 9 th.  Please schedule followup in 4-6 weeks, call sooner if problems.

## 2013-11-24 NOTE — Assessment & Plan Note (Signed)
10/13/13 MRI reviewed: IMPRESSION:  1. Constellation of findings most compatible with intracranial  hypotension. CSF opening pressure would confirm.  2. No other acute intracranial abnormality. Stable mild for age  nonspecific cerebral white matter changes since 2010.   ER eval 3/28 for continue HA - "IP" Nsurg and neuro eval reviewed -  OP neuro workup ongoing - reviewed today  continue TCA and remain out of work until further action plan developed and symptoms improved (last day 3/24 in office, out until 4/9 per AVP OV 3/26 - extended to 11/26/13 at 10/26/13 OV - will extend another 30d now)

## 2013-11-24 NOTE — Progress Notes (Signed)
[facility] [id number] 11/24/2013  No chief complaint on file.   Subjective  HPI  Patient here for headaches and f/u for MRI done yesterday (11/23/13).  Patient has had HAs X 2 months. Located at the top/front part of her head. Lasts anywhere from 1-2 minutes to several hours. Describes pain as stabbing/feels like her head is going to explode. Fast movements of head, such as coughing, sneezing, bending her head forward and then moving it up quickly triggers headaches. Currently takes nortriptyline 100mg  and indomethacin for migraine prophylaxis, and ibuprofen for abortive therapy. Ibuprofen improves sxs, but head still  hurts all the time. Laying down relieves pain. HA at its worst is 8/10, and at its best 2-3/10 on pain scale.   Pertinent positives: diplopia, hx of migraines  Pertinent negatives: N/V/D, aura, triggers, hx of trauma, tinnitus, hearing loss, dizziness, rhinorrhea, sinus pressure.  MR 3/26/15I: showed intracerebral hypotension. Lumbar puncture and CSF analysis showed low pressure CSF, confirming dx of intracerebral hypotension. F/u MRI 11/23/13 to look for leak.  MRI 11/23/13 at [facility]: cerebellar tonsillar ectopia-54mm-slightly improved from last MRI; minimal spondylolysis of cervical spine, and negative MRI of thoracic spine w/ shallow central disc protrusion at L3-L4 w/o significant stenosis.   Follows currently w/ [facility]. Current medications are Vitamin D, Thyroid medication, nortryptiline, and indomethicin. Needs note for extended leave and refill of nortriptyline.     Past Medical History  Diagnosis Date  . VITAMIN B12 DEFICIENCY 09/2009 dx  . Unspecified hypothyroidism 1982/2000    surgical resection of benign growth - partial then complete  . GERD (gastroesophageal reflux disease)     per endo. - does not take any meds.    Marland Kitchen MIGRAINE HEADACHE     last Migraine 06/29/2011-   . ANEMIA, IRON DEFICIENCY   . Kidney stones 1998, 2005    x 2  . Depression    therapy  . Fibromyalgia 1999    no meds    Past Surgical History  Procedure Laterality Date  . Total thyroidectomy  2000    Had thyroid nodule, nonmalignant ([id number]) resection  . Cholecystectomy  2009  . Tubal ligation  1989  . Diagnostic laparoscopy  1982    diagnostic  . Thyroid surgery  1983    partial   . Posterior fusion lumbar spine  06/2011    Family History  Problem Relation Age of Onset  . Hyperlipidemia Mother   . Heart disease Mother   . Hyperlipidemia Father   . Breast cancer Maternal Grandmother   . Hyperlipidemia Maternal Grandmother   . Ovarian cancer Other   . Anesthesia problems Neg Hx   . Malignant hyperthermia Neg Hx   . Pseudochol deficiency Neg Hx   . Hypotension Neg Hx     History  Substance Use Topics  . Smoking status: Never Smoker   . Smokeless tobacco: Not on file     Comment: Married, lives with spouse. 3 children. 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 Alcohol Use: No    Current Outpatient Prescriptions on File Prior to Visit  Medication Sig Dispense Refill  . Cyanocobalamin (VITAMIN B-12) 1000 MCG SUBL Place 1 tablet (1,000 mcg total) under the tongue daily.  100 tablet  3  . ergocalciferol (VITAMIN D2) 50000 UNITS capsule Take 50,000 Units by mouth once a week. Take 50,000 units on Wednesday      . indomethacin (INDOCIN) 25 MG capsule Take 1 capsule (25 mg total) by mouth 2 (  two) times daily with a meal.  60 capsule  1  . levothyroxine (SYNTHROID, LEVOTHROID) 175 MCG tablet Take 175 mcg by mouth daily before breakfast.      . Multiple Vitamin (MULTIVITAMIN) tablet Take 1 tablet by mouth daily.        No current facility-administered medications on file prior to visit.     Allergies: Allergies  Allergen Reactions  . Compazine Anaphylaxis    Hypotension  . Gluten Meal     Vitamin B irregulaties  . Metoclopramide Hcl Anaphylaxis    Hypotension  . Morphine And Related     nausea    Review of Systems    Constitutional: Positive for fever. Negative for chills, weight loss and malaise/fatigue.  HENT: Negative for congestion, ear pain, hearing loss, sore throat and tinnitus.   Eyes: Positive for double vision. Negative for blurred vision, photophobia, pain and redness.  Respiratory: Negative for cough, shortness of breath and wheezing.   Cardiovascular: Negative for chest pain, palpitations, orthopnea and leg swelling.  Gastrointestinal: Negative for nausea, vomiting, abdominal pain and diarrhea.  Genitourinary:       No urinary changes.  Musculoskeletal: Negative for joint pain, myalgias and neck pain.  Neurological: Positive for headaches. Negative for dizziness, tingling, tremors, sensory change, speech change and focal weakness.  Psychiatric/Behavioral: Negative for depression and suicidal ideas. The patient has insomnia. The patient is not nervous/anxious.        Objective  Filed Vitals:   11/24/13 1133  BP: 124/90  Pulse: 86  Temp: 98.6 F (37 C)  TempSrc: Oral  Height: 5\' 3"  (1.6 m)  Weight: 136 lb 12 oz (62.029 kg)  SpO2: 97%    Physical Exam  Constitutional: She is oriented to person, place, and time. She appears well-developed and well-nourished. No distress.  HENT:  Right Ear: External ear normal.  Left Ear: External ear normal.  Mouth/Throat: Oropharyngeal exudate present.  Eyes: Pupils are equal, round, and reactive to light. Right eye exhibits no discharge. Left eye exhibits no discharge. Scleral icterus is present.  Neck: Normal range of motion. Neck supple. No thyromegaly present.  Cardiovascular: Normal rate, regular rhythm and normal heart sounds.  Exam reveals no gallop and no friction rub.   No murmur heard. Respiratory: Effort normal and breath sounds normal. No respiratory distress. She has no wheezes. She has no rales.  Musculoskeletal: Normal range of motion.  Lymphadenopathy:    She has no cervical adenopathy.  Neurological: She is alert and  oriented to person, place, and time. She has normal reflexes. She displays normal reflexes. No cranial nerve deficit. Coordination normal.  Psychiatric: She has a normal mood and affect. Her behavior is normal. Judgment and thought content normal.    BP Readings from Last 3 Encounters:  11/24/13 124/90  11/14/13 104/72  11/03/13 102/70    Wt Readings from Last 3 Encounters:  11/24/13 136 lb 12 oz (62.029 kg)  11/14/13 136 lb (61.689 kg)  11/03/13 136 lb 11.2 oz (62.007 kg)    Lab Results  Component Value Date   WBC 5.9 10/13/2013   HGB 12.3 10/13/2013   HCT 36.7 10/13/2013   PLT 254.0 10/13/2013   GLUCOSE 93 10/13/2013   CHOL 214* 06/29/2013   TRIG 118.0 06/29/2013   HDL 55.80 06/29/2013   LDLDIRECT 145.1 06/29/2013   LDLCALC 93 10/03/2009   ALT 18 10/13/2013   AST 24 10/13/2013   NA 136 10/13/2013   K 4.4 10/13/2013   CL 104 10/13/2013  CREATININE 0.8 10/13/2013   BUN 8 10/13/2013   CO2 25 10/13/2013   TSH 0.19* 10/13/2013   INR 1.02 07/04/2011    Mr Cervical Spine W Wo Contrast  11/23/2013   CLINICAL DATA:  Headache. Intracranial hypotension. Abnormal brain MRI.  EXAM: MRI CERVICAL, THORACIC AND LUMBAR SPINE WITHOUT AND WITH CONTRAST  TECHNIQUE: Multiplanar and multiecho pulse sequences of the cervical spine, to include the craniocervical junction and cervicothoracic junction, and thoracic and lumbar spine, were obtained without and with intravenous contrast.  CONTRAST:  12mL MULTIHANCE GADOBENATE DIMEGLUMINE 529 MG/ML IV SOLN  COMPARISON:  MRI lumbar spine 12/03/2011.  MRI brain 10/13/2013.  FINDINGS: MRI CERVICAL SPINE FINDINGS  Normal signal is present in the cervical spinal cord. The cerebellar tonsils extend approximately 4 mm below the foramen magnum in the midline. The intracranial contents are unremarkable.  C2-3:  Negative.  C3-4:  Negative.  C4-5: A shallow central disc protrusion is present without significant stenosis.  C5-6: A mild broad-based disc osteophyte complex is  present without significant stenosis.  C6-7:  Slight disc bulging is present without significant stenosis.  C7-T1:  Negative.  The postcontrast images demonstrate no pathologic enhancement.  MRI THORACIC SPINE FINDINGS  Normal signal is present throughout the spinal cord. The conus medullaris terminates at T12-L1, within normal limits. Schmorl's nodes are present at T6-7 and T8-9. Marrow signal, vertebral body heights, alignment are normal.  No significant post disc protrusion is present. The foramina are patent bilaterally. The visualized paraspinal structures are within normal limits.  The postcontrast images demonstrate no pathologic enhancement.  MRI LUMBAR SPINE FINDINGS  Patient is status post PLIF at L4-5. Marrow signal, vertebral body heights, and alignment are otherwise normal. Limited imaging the abdomen is within normal limits.  The disc levels at L2-3 and above are normal.  L3-4: A shallow central disc protrusion is present. There is no significant stenosis.  L4-5: The patient is status post left laminectomy. No residual or recurrent disc protrusion or stenosis is evident.  L5-S1:  Negative.  The postcontrast images demonstrate enhancement at the surgical site of L4-5. No pathologic enhancement is evident.  IMPRESSION: 1. Minimal spondylosis and cervical spine without significant stenosis. 2. Cerebellar tonsillar ectopia is similar to slightly improved from the prior study, now measuring 4 mm. 3. Negative MRI of the thoracic spine. 4. Stable postsurgical changes of left laminectomy and fusion without residual or recurrent protrusion or stenosis at L4-5. 5. Shallow central disc protrusion at L3-4 without significant stenosis.   Electronically Signed   By: Gennette Pachris  Mattern M.D.   On: 11/23/2013 21:36   Mr Thoracic Spine W Wo Contrast  11/23/2013   CLINICAL DATA:  Headache. Intracranial hypotension. Abnormal brain MRI.  EXAM: MRI CERVICAL, THORACIC AND LUMBAR SPINE WITHOUT AND WITH CONTRAST  TECHNIQUE:  Multiplanar and multiecho pulse sequences of the cervical spine, to include the craniocervical junction and cervicothoracic junction, and thoracic and lumbar spine, were obtained without and with intravenous contrast.  CONTRAST:  12mL MULTIHANCE GADOBENATE DIMEGLUMINE 529 MG/ML IV SOLN  COMPARISON:  MRI lumbar spine 12/03/2011.  MRI brain 10/13/2013.  FINDINGS: MRI CERVICAL SPINE FINDINGS  Normal signal is present in the cervical spinal cord. The cerebellar tonsils extend approximately 4 mm below the foramen magnum in the midline. The intracranial contents are unremarkable.  C2-3:  Negative.  C3-4:  Negative.  C4-5: A shallow central disc protrusion is present without significant stenosis.  C5-6: A mild broad-based disc osteophyte complex is present without significant stenosis.  C6-7:  Slight disc bulging is present without significant stenosis.  C7-T1:  Negative.  The postcontrast images demonstrate no pathologic enhancement.  MRI THORACIC SPINE FINDINGS  Normal signal is present throughout the spinal cord. The conus medullaris terminates at T12-L1, within normal limits. Schmorl's nodes are present at T6-7 and T8-9. Marrow signal, vertebral body heights, alignment are normal.  No significant post disc protrusion is present. The foramina are patent bilaterally. The visualized paraspinal structures are within normal limits.  The postcontrast images demonstrate no pathologic enhancement.  MRI LUMBAR SPINE FINDINGS  Patient is status post PLIF at L4-5. Marrow signal, vertebral body heights, and alignment are otherwise normal. Limited imaging the abdomen is within normal limits.  The disc levels at L2-3 and above are normal.  L3-4: A shallow central disc protrusion is present. There is no significant stenosis.  L4-5: The patient is status post left laminectomy. No residual or recurrent disc protrusion or stenosis is evident.  L5-S1:  Negative.  The postcontrast images demonstrate enhancement at the surgical site of  L4-5. No pathologic enhancement is evident.  IMPRESSION: 1. Minimal spondylosis and cervical spine without significant stenosis. 2. Cerebellar tonsillar ectopia is similar to slightly improved from the prior study, now measuring 4 mm. 3. Negative MRI of the thoracic spine. 4. Stable postsurgical changes of left laminectomy and fusion without residual or recurrent protrusion or stenosis at L4-5. 5. Shallow central disc protrusion at L3-4 without significant stenosis.   Electronically Signed   By: Gennette Pachris  Mattern M.D.   On: 11/23/2013 21:36   Mr Lumbar Spine W Wo Contrast  11/23/2013   CLINICAL DATA:  Headache. Intracranial hypotension. Abnormal brain MRI.  EXAM: MRI CERVICAL, THORACIC AND LUMBAR SPINE WITHOUT AND WITH CONTRAST  TECHNIQUE: Multiplanar and multiecho pulse sequences of the cervical spine, to include the craniocervical junction and cervicothoracic junction, and thoracic and lumbar spine, were obtained without and with intravenous contrast.  CONTRAST:  12mL MULTIHANCE GADOBENATE DIMEGLUMINE 529 MG/ML IV SOLN  COMPARISON:  MRI lumbar spine 12/03/2011.  MRI brain 10/13/2013.  FINDINGS: MRI CERVICAL SPINE FINDINGS  Normal signal is present in the cervical spinal cord. The cerebellar tonsils extend approximately 4 mm below the foramen magnum in the midline. The intracranial contents are unremarkable.  C2-3:  Negative.  C3-4:  Negative.  C4-5: A shallow central disc protrusion is present without significant stenosis.  C5-6: A mild broad-based disc osteophyte complex is present without significant stenosis.  C6-7:  Slight disc bulging is present without significant stenosis.  C7-T1:  Negative.  The postcontrast images demonstrate no pathologic enhancement.  MRI THORACIC SPINE FINDINGS  Normal signal is present throughout the spinal cord. The conus medullaris terminates at T12-L1, within normal limits. Schmorl's nodes are present at T6-7 and T8-9. Marrow signal, vertebral body heights, alignment are normal.  No  significant post disc protrusion is present. The foramina are patent bilaterally. The visualized paraspinal structures are within normal limits.  The postcontrast images demonstrate no pathologic enhancement.  MRI LUMBAR SPINE FINDINGS  Patient is status post PLIF at L4-5. Marrow signal, vertebral body heights, and alignment are otherwise normal. Limited imaging the abdomen is within normal limits.  The disc levels at L2-3 and above are normal.  L3-4: A shallow central disc protrusion is present. There is no significant stenosis.  L4-5: The patient is status post left laminectomy. No residual or recurrent disc protrusion or stenosis is evident.  L5-S1:  Negative.  The postcontrast images demonstrate enhancement at the surgical site of  L4-5. No pathologic enhancement is evident.  IMPRESSION: 1. Minimal spondylosis and cervical spine without significant stenosis. 2. Cerebellar tonsillar ectopia is similar to slightly improved from the prior study, now measuring 4 mm. 3. Negative MRI of the thoracic spine. 4. Stable postsurgical changes of left laminectomy and fusion without residual or recurrent protrusion or stenosis at L4-5. 5. Shallow central disc protrusion at L3-4 without significant stenosis.   Electronically Signed   By: Gennette Pac M.D.   On: 11/23/2013 21:36       Assessment and Plan  Headaches: Multifactorial etiology (stress and neurological abnormality). Intracranial hypotension confirmed on MRI w/ CSF analysis; currently seeing counselor a/b divorce she is going through w/ husband; hx of migraines. Told patient to  continue nortryptiline and indomethicin for migraine prophylaxis and ibuprofen for abortive therapy. Refilled nortrptyline. Extended FMLA for an additoinal 30 days. F/u w/ [facility] for ongoing evaluation of low pressure intracranial hypotension, to review MRI on 11/23/13, to make medication changes, and to determine when patient can return to work. No need for neurosurgery at  this time.     No Follow-up on file. Larwance Rote, Student-PA   I have personally reviewed this case with PA student. I also personally examined this patient. I agree with history and findings as documented above. I reviewed, discussed and approve of the assessment and plan as listed above. Newt Lukes, MD

## 2013-11-24 NOTE — Progress Notes (Signed)
Pre visit review using our clinic review tool, if applicable. No additional management support is needed unless otherwise documented below in the visit note. 

## 2013-11-24 NOTE — Assessment & Plan Note (Signed)
s/p 01/20/12 appt with rheum PA > Referred to pain mgmt for same RLE improved s/p ESI 02/2012 increased pamelor (also for depression) 01/08/12 and again 04/2013 to help with insomnia/headache  Continue other meds as rx'd: NSAIDs (no longer on gabapentin or tramadol) reassurance provided, no other medication changes recommended at this time

## 2013-11-28 ENCOUNTER — Ambulatory Visit (INDEPENDENT_AMBULATORY_CARE_PROVIDER_SITE_OTHER): Payer: 59 | Admitting: Neurology

## 2013-11-28 ENCOUNTER — Encounter: Payer: Self-pay | Admitting: Neurology

## 2013-11-28 ENCOUNTER — Other Ambulatory Visit: Payer: Self-pay | Admitting: Orthopedic Surgery

## 2013-11-28 ENCOUNTER — Ambulatory Visit
Admission: RE | Admit: 2013-11-28 | Discharge: 2013-11-28 | Disposition: A | Discharge status: Home / Self Care | Payer: 59 | Source: Ambulatory Visit | Attending: Orthopedic Surgery | Admitting: Orthopedic Surgery

## 2013-11-28 VITALS — BP 134/85 | HR 92

## 2013-11-28 VITALS — BP 118/68 | HR 78 | Temp 98.0°F | Resp 18 | Ht 63.0 in | Wt 137.2 lb

## 2013-11-28 DIAGNOSIS — G971 Other reaction to spinal and lumbar puncture: Secondary | ICD-10-CM

## 2013-11-28 DIAGNOSIS — G96810 Intracranial hypotension, unspecified: Secondary | ICD-10-CM

## 2013-11-28 DIAGNOSIS — G9389 Other specified disorders of brain: Secondary | ICD-10-CM

## 2013-11-28 DIAGNOSIS — R51 Headache: Secondary | ICD-10-CM

## 2013-11-28 MED ORDER — PREDNISONE 10 MG PO TABS: ORAL_TABLET | ORAL | Status: DC

## 2013-11-28 MED ORDER — IOHEXOL 180 MG/ML  SOLN
1.0000 mL | Freq: Once | INTRAMUSCULAR | Status: AC | PRN
  Administered 2013-11-28: 1 mL via EPIDURAL

## 2013-11-28 NOTE — Patient Instructions (Addendum)
The MRI didn't reveal any evidence of a spinal fluid leak.  I would still like to treat for a spinal fluid leak headache. 1.  I will prescribe you a prednisone (steroid) taper to help ease and hopefully even break the headache.  Take 6tabs x1day, then 5tabs x1day, then 4tabs x1day, then 3tabs x1day, then 2tabs x1day, then 1tab x1day, then STOP Do not start this for 1 week  Call the office and let us know in 1 week how your doing  2.  We will schedule for a blood patch. [address] [place] [address]   2:15 3.  If the blood patch doesn't work, we will proceed with a CT myelogram, where they inject contrast in your spinal canal and check a CT for evidence of leak. 4.  If no source of leak found, then we can repeat blood patch or repeat MRI of the brain to look for any changes since last MRI of the brain. 5.  In the meantime, continue increased fluids, caffeine, salt. 6.  Follow up in 4 weeks.

## 2013-11-28 NOTE — Discharge Instructions (Addendum)
Epidural Blood Patch Discharge Instructions  1. Go home and rest quietly for the next 24 hours.  It is important to lie flat for the next 24 hours.  Get up only to go to the restroom.  You may lie in the bed or on a couch on your back, your stomach, your left side or your right side.  You may have one pillow under your head.  You may have pillows between your knees while you are on your side or under your knees while you are on your back.  2. DO NOT drive today.  Recline the seat as far back as it will go, while still wearing your seat belt, on the way home.  3. You may get up to go to the bathroom as needed.  You may sit up for 10 minutes to eat.  You may resume your normal diet and medications unless otherwise indicated.  Drink plenty of extra fluids today and tomorrow.   4. You may resume normal activities after your 24 hours of bed rest is over; however, do not exert yourself strongly or do any heavy lifting tomorrow.  5.  Call your physician for a follow-up appointment.

## 2013-11-28 NOTE — Progress Notes (Addendum)
NEUROLOGY FOLLOW UP OFFICE NOTE  Melissa Kirby [id number]  HISTORY OF PRESENT ILLNESS: Melissa Kirby is a 53 year old right-handed woman with history of migraine, B12 deficiency, hypothyroidism, kidney stones, depression and fibromyalgia who presents for headache. Notes and MRI of brain from 2015 and 2010 personally reviewed.  She is accompanied by her husband.  UPDATE: 11/14/13 LP with opening pressure of 11 cmH20 and closing pressure less than 9 cmH20.  CSF cell count 6, glucose 58, protein 35, gram stain and culture negative. 11/23/13 MRI of the cervical, thoracic and lumbar spines with and without contrast did not reveal any post-contrast signal abnormality or location for CSF leak.  No change to headache. Current management:  Increased fluids, salt intake, caffeine.  Already takes nortriptyline 173m.  HISTORY: Onset:  January-February 2015, coinciding with initiation of Effexor Location:  Front to top of head.  Tenderness to left suboccipital region. Quality:  Stabbing with constant pressure Intensity:  Initially 10/10, now 4-5/10 since discontinuing Effexor Aura:  No Associated symptoms:  Slight horizontal double vision.  Aural fullness.  No nausea, vomiting, facial numbness, syncope.  Has chronic tingling in hands and feet related to fibromyalgia. Duration:  Initially up to 24 hours, now 30-60 minutes since discontinuation of Effexor Frequency:  Several times a day Triggers/exacerbating factors:  Change in position, such as standing up right after having bent forward, coughing, sneezing, Valsalva Relieving factors:  Laying supine helps (relieves the stabbing pain), resting, sleeping Activity:  Cannot function.  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  Off work at this time. Past abortive medication:  Promethazine with codeine (ineffective).  Now taking nothing   MRI of the brain was performed on 10/13/13, which revealed inferior displacement  of the cerebellum and brainstem, with flattening of the ventral pons and diminished suprasellar cistern volume, as well as mild circumferential dural thickening, findings which may be seen in intracranial hypotension.  This was new compared to prior scan in 2010.  She presented to the ED on 10/15/13 with worsening headache.  She was given IV fluids, Dilaudid, and Benadryl, which improved the pain to 4/10.  She was evaluated by both neurology and neurosurgery, and was stable for discharge.  Effexor was tapered off and nortriptyline was increased to 1062mand indomethacin.  10/13/13 LABS:  TSH 0.19, B12 281, Vit D 25 hydroxy 18, ESR 35  Typical Migraines: Onset:  Since adolescence Location:  From back of head up to top.   Quality:  pounding Intensity:  10/10 Aura:  Halo effect just before onset of headache Associated symptoms:  Nausea, vomiting, photophobia, phonophobia, osmophobia Duration:  12-13 hours (one time it lasted 13 days), followed by scalp sensitivity the next day. Frequency:  Once every other month Triggers/exacerbating factors:  None Relieving factors:  Sleep.  Frequency improved following gluten-free diet. Activity:  Cannot function  Past abortive therapy:  Tylenol (ineffective), Imitrex (ineffective), Maxalt (ineffective), trigger point injections (ineffective), nasal spray (ineffective), Excedrin (ineffective), biofreeze (ineffective). Past preventative therapy:  topamax (ineffective)  Current abortive therapy:  ibuprofen Current preventative therapy:  Nortriptyline 10017mCaffeine:  Was drinking Dr. PepMalachi Bonds/day.  Now trying to cut back Depression/stress:  She has had significant increase in depression over the past several months.  Her father passed away unexpectedly on Thanksgiving.  She has had marital difficulties.  She left her husband but is now in marriage counseling. Sleep hygiene:  Sleeps better but not rested.  PAST  MEDICAL HISTORY: Past Medical History    Diagnosis Date  . VITAMIN B12 DEFICIENCY 09/2009 dx  . Unspecified hypothyroidism 1982/2000    surgical resection of benign growth - partial then complete  . GERD (gastroesophageal reflux disease)     per endo. - does not take any meds.    Marland Kitchen MIGRAINE HEADACHE     last Migraine 06/29/2011-   . ANEMIA, IRON DEFICIENCY   . Kidney stones 1998, 2005    x 2  . Depression     therapy  . Fibromyalgia 1999    no meds    MEDICATIONS: Current Outpatient Prescriptions on File Prior to Visit  Medication Sig Dispense Refill  . Cyanocobalamin (VITAMIN B-12) 1000 MCG SUBL Place 1 tablet (1,000 mcg total) under the tongue daily.  100 tablet  3  . ergocalciferol (VITAMIN D2) 50000 UNITS capsule Take 50,000 Units by mouth once a week. Take 50,000 units on Wednesday      . indomethacin (INDOCIN) 25 MG capsule Take 1 capsule (25 mg total) by mouth 2 (two) times daily with a meal.  60 capsule  1  . levothyroxine (SYNTHROID, LEVOTHROID) 175 MCG tablet Take 175 mcg by mouth daily before breakfast.      . Multiple Vitamin (MULTIVITAMIN) tablet Take 1 tablet by mouth daily.       . nortriptyline (PAMELOR) 50 MG capsule Take 2 capsules (100 mg total) by mouth at bedtime.  60 capsule  2   No current facility-administered medications on file prior to visit.    ALLERGIES: Allergies  Allergen Reactions  . Compazine Anaphylaxis    Hypotension  . Gluten Meal     Vitamin B irregulaties  . Metoclopramide Hcl Anaphylaxis    Hypotension  . Morphine And Related     nausea    FAMILY HISTORY: Family History  Problem Relation Age of Onset  . Hyperlipidemia Mother   . Heart disease Mother   . Hyperlipidemia Father   . Breast cancer Maternal Grandmother   . Hyperlipidemia Maternal Grandmother   . Ovarian cancer Other   . Anesthesia problems Neg Hx   . Malignant hyperthermia Neg Hx   . Pseudochol deficiency Neg Hx   . Hypotension Neg Hx     SOCIAL HISTORY: History   Social History  . Marital  Status: Married    Spouse Name: N/A    Number of Children: N/A  . Years of Education: N/A   Occupational History  . Not on file.   Social History Main Topics  . Smoking status: Never Smoker   . Smokeless tobacco: Not on file     Comment: Married, lives with spouse. 3 children. 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 Alcohol Use: No  . Drug Use: No  . Sexual Activity: Yes    Birth Control/ Protection: Surgical     Comment: Married lives with spouse, 3 children. 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 Not on file   Social History Narrative  . No narrative on file    REVIEW OF SYSTEMS: Constitutional: No fevers, chills, or sweats, no generalized fatigue, change in appetite Eyes: No visual changes, double vision, eye pain Ear, nose and throat: No hearing loss, ear pain, nasal congestion, sore throat Cardiovascular: No chest pain, palpitations Respiratory:  No shortness of breath at rest or with exertion, wheezes GastrointestinaI: No nausea, vomiting, diarrhea, abdominal pain, fecal incontinence Genitourinary:  No dysuria, urinary retention or  frequency Musculoskeletal:  Neck pain Integumentary: No rash, pruritus, skin lesions Neurological: as above Psychiatric: depression, insomnia, anxiety Endocrine: No palpitations, fatigue, diaphoresis, mood swings, change in appetite, change in weight, increased thirst Hematologic/Lymphatic:  No anemia, purpura, petechiae. Allergic/Immunologic: no itchy/runny eyes, nasal congestion, recent allergic reactions, rashes  PHYSICAL EXAM: Filed Vitals:   11/28/13 0836  BP: 118/68  Pulse: 78  Temp: 98 F (36.7 C)  Resp: 18   General: No acute distress Head:  Normocephalic/atraumatic Neck: supple, some left suboccipital tenderness, full range of motion Heart:  Regular rate and rhythm Lungs:  Clear to auscultation bilaterally Back: No paraspinal tenderness Neurological Exam: alert and  oriented to person, place, and time. Attention span and concentration intact, recent and remote memory intact, fund of knowledge intact.  Speech fluent and not dysarthric, language intact.  CN II-XII intact. Fundoscopic exam unremarkable without vessel changes, exudates, hemorrhages or papilledema.  Bulk and tone normal, muscle strength 5/5 throughout.  Sensation to light touch, temperature and vibration intact.  Deep tendon reflexes 2+ throughout, toes downgoing.  Finger to nose and heel to shin testing intact.  Gait normal, Romberg negative.  IMPRESSION: Possible spontaneous intracranial hypotensive headache (Opening pressure normal however.  Possibly she had a spontaneous leak that healed?  Since symptoms persist, will treat as low-pressure headache). Migraine with visual aura  PLAN: 1.  Will schedule empirical epidural blood patch today (2:15pm). 2. She is to call with update in one week.  If still with headache, will have her start a prednisone taper.  In the meantime, should also continue increased hydration, soft, and caffeine intake. 3. If blood patch ineffective, will proceed with CT myelogram to look for source of possible leak. If unremarkable, would either repeat MRI of the brain or proceed with a second blood patch. 4. Followup in 4 weeks.  Until then, further management or testing will be made if needed.  Metta Clines, DO  CC: Gwendolyn Grant, MD

## 2013-11-28 NOTE — Progress Notes (Signed)
[facility] for Epidural Blood Patch; site unremarkable.  jkl

## 2013-11-28 NOTE — Progress Notes (Signed)
Friend (and driver) Philis NettleJerri McKenna at bedside.  Patient resting quietly on stretcher after procedure.  States pressure in head is already a little better.  jkl

## 2013-11-29 ENCOUNTER — Ambulatory Visit: Payer: 59 | Admitting: Licensed Clinical Social Worker

## 2013-12-06 ENCOUNTER — Ambulatory Visit (INDEPENDENT_AMBULATORY_CARE_PROVIDER_SITE_OTHER): Payer: 59 | Admitting: Licensed Clinical Social Worker

## 2013-12-06 DIAGNOSIS — F4322 Adjustment disorder with anxiety: Secondary | ICD-10-CM

## 2013-12-07 ENCOUNTER — Telehealth: Payer: Self-pay | Admitting: Neurology

## 2013-12-07 NOTE — Telephone Encounter (Signed)
Pt states that she is a little better and pressure is not as strong please call and let her know if she is suppose to start a medication [phone]

## 2013-12-08 NOTE — Telephone Encounter (Signed)
Patient states she is feeling better since the Blood Patch still having some headache but not like she was

## 2013-12-08 NOTE — Telephone Encounter (Signed)
I would probably hold off on starting prednisone if she is feeling better.  We will continue to monitor how she is doing.  If things get worse, we can always repeat the blood patch, since she has had a positive response to it.

## 2013-12-08 NOTE — Telephone Encounter (Signed)
Please advise 

## 2013-12-13 ENCOUNTER — Ambulatory Visit: Payer: 59 | Admitting: Licensed Clinical Social Worker

## 2013-12-14 ENCOUNTER — Other Ambulatory Visit: Payer: Self-pay | Admitting: *Deleted

## 2013-12-14 ENCOUNTER — Telehealth: Payer: Self-pay | Admitting: Neurology

## 2013-12-14 DIAGNOSIS — R51 Headache: Secondary | ICD-10-CM

## 2013-12-14 NOTE — Telephone Encounter (Signed)
Pt called to give an update on her headaches. Update: Pt states that there is no significant change and is still having headaches.

## 2013-12-14 NOTE — Telephone Encounter (Signed)
I would like to order a CT myelogram to look for a source of a CSF leak.

## 2013-12-14 NOTE — Telephone Encounter (Signed)
Please advise  On below note 

## 2013-12-14 NOTE — Telephone Encounter (Signed)
Please call pt about sending records to [facility] - fax# [phone]. CB# [phone] / Sherri S.

## 2013-12-15 NOTE — Telephone Encounter (Signed)
Patient will have ct myelogram on 12/21/13  [place]

## 2013-12-15 NOTE — Telephone Encounter (Signed)
Pt needs to have just the last office note faxed to [phone]  Pt phone number [phone]

## 2013-12-20 ENCOUNTER — Ambulatory Visit: Payer: 59 | Admitting: Psychology

## 2013-12-21 ENCOUNTER — Other Ambulatory Visit: Payer: Self-pay

## 2013-12-22 ENCOUNTER — Telehealth: Payer: Self-pay | Admitting: Radiology

## 2013-12-22 ENCOUNTER — Ambulatory Visit
Admission: RE | Admit: 2013-12-22 | Discharge: 2013-12-22 | Disposition: A | Discharge status: Home / Self Care | Payer: 59 | Source: Ambulatory Visit | Attending: Neurology | Admitting: Neurology

## 2013-12-22 VITALS — BP 114/81 | HR 73

## 2013-12-22 DIAGNOSIS — R51 Headache: Secondary | ICD-10-CM

## 2013-12-22 MED ORDER — ONDANSETRON HCL 4 MG/2ML IJ SOLN
4.0000 mg | Freq: Once | INTRAMUSCULAR | Status: AC
Start: 2013-12-22 — End: 2013-12-22
  Administered 2013-12-22: 4 mg via INTRAMUSCULAR

## 2013-12-22 MED ORDER — DIAZEPAM 5 MG PO TABS
10.0000 mg | ORAL_TABLET | Freq: Once | ORAL | Status: AC
  Administered 2013-12-22: 10 mg via ORAL

## 2013-12-22 MED ORDER — IOHEXOL 300 MG/ML  SOLN
10.0000 mL | Freq: Once | INTRAMUSCULAR | Status: AC | PRN
  Administered 2013-12-22: 10 mL via INTRATHECAL

## 2013-12-22 MED ORDER — MEPERIDINE HCL 100 MG/ML IJ SOLN
75.0000 mg | Freq: Once | INTRAMUSCULAR | Status: AC
  Administered 2013-12-22: 75 mg via INTRAMUSCULAR

## 2013-12-22 NOTE — Discharge Instructions (Signed)
Myelogram Discharge Instructions  1. Go home and rest quietly for the next 24 hours.  It is important to lie flat for the next 24 hours.  Get up only to go to the restroom.  You may lie in the bed or on a couch on your back, your stomach, your left side or your right side.  You may have one pillow under your head.  You may have pillows between your knees while you are on your side or under your knees while you are on your back.  2. DO NOT drive today.  Recline the seat as far back as it will go, while still wearing your seat belt, on the way home.  3. You may get up to go to the bathroom as needed.  You may sit up for 10 minutes to eat.  You may resume your normal diet and medications unless otherwise indicated.  Drink lots of extra fluids today and tomorrow.  4. The incidence of headache, nausea, or vomiting is about 5% (one in 20 patients).  If you develop a headache, lie flat and drink plenty of fluids until the headache goes away.  Caffeinated beverages may be helpful.  If you develop severe nausea and vomiting or a headache that does not go away with flat bed rest, call [phone].  5. You may resume normal activities after your 24 hours of bed rest is over; however, do not exert yourself strongly or do any heavy lifting tomorrow. If when you get up you have a headache when standing, go back to bed and force fluids for another 24 hours.  6. Call your physician for a follow-up appointment.  The results of your myelogram will be sent directly to your physician by the following day.  7. If you have any questions or if complications develop after you arrive home, please call [phone].  Discharge instructions have been explained to the patient.  The patient, or the person responsible for the patient, fully understands these instructions.      May resume Nortriptyline on December 23, 2013, after 7:30 am.

## 2013-12-22 NOTE — Telephone Encounter (Signed)
Pt c/o pain at base of skull and neck, asked what she could do.  Explained she should take her pain meds, anti-inflamatories and use an ice pack.  She said she used ice in the past and it worked well for her.

## 2013-12-22 NOTE — Progress Notes (Signed)
Pt states she has been off nortriptyline for the past 2 days. Discharge instructions explained to pt.

## 2013-12-26 ENCOUNTER — Other Ambulatory Visit: Payer: Self-pay | Admitting: Neurology

## 2013-12-26 ENCOUNTER — Ambulatory Visit (INDEPENDENT_AMBULATORY_CARE_PROVIDER_SITE_OTHER): Payer: 59 | Admitting: Neurology

## 2013-12-26 ENCOUNTER — Encounter: Payer: Self-pay | Admitting: Neurology

## 2013-12-26 VITALS — BP 130/96 | HR 68 | Temp 97.8°F | Resp 18 | Ht 63.0 in | Wt 135.0 lb

## 2013-12-26 DIAGNOSIS — G971 Other reaction to spinal and lumbar puncture: Secondary | ICD-10-CM

## 2013-12-26 DIAGNOSIS — G43909 Migraine, unspecified, not intractable, without status migrainosus: Secondary | ICD-10-CM

## 2013-12-26 DIAGNOSIS — G9389 Other specified disorders of brain: Secondary | ICD-10-CM

## 2013-12-26 DIAGNOSIS — G96810 Intracranial hypotension, unspecified: Secondary | ICD-10-CM

## 2013-12-26 MED ORDER — BUPIVACAINE HCL (PF) 0.25 % IJ SOLN: 30.0000 mL | Freq: Once | INTRAMUSCULAR | Status: DC

## 2013-12-26 MED ORDER — BUPIVACAINE HCL (PF) 0.25 % IJ SOLN
3.0000 mL | Freq: Once | INTRAMUSCULAR | Status: AC
Start: 2013-12-26 — End: 2013-12-26
  Administered 2013-12-26: 3 mL

## 2013-12-26 MED ORDER — TRIAMCINOLONE ACETONIDE 40 MG/ML IJ SUSP
40.0000 mg | Freq: Once | INTRAMUSCULAR | Status: AC
  Administered 2013-12-26: 40 mg

## 2013-12-26 MED ORDER — BUTALBITAL-APAP-CAFFEINE 50-325-40 MG PO TABS: 1.0000 | ORAL_TABLET | Freq: Two times a day (BID) | ORAL | Status: DC | PRN

## 2013-12-26 NOTE — Procedures (Signed)
Procedure: Occipital nerve block  Procedure risks, benefits and alternative treatments explained to patient and they agree to proceed.   A concentration of 0.25% bupivacaine (3 ml) was mixed with 40 mg of Kenalog (1 ml). A 27 gauge needle was used for injection. The region of the left greater occipital nerve was located by palpation. The area was prepped with alcohol. A total of 2 cc of the above mixture was injected without difficulty. The patient tolerated the procedure well.    Neilani Duffee R. Nezar Buckles, DO  

## 2013-12-26 NOTE — Addendum Note (Signed)
Addended byEverlena Cooper, ADAM R on: 12/26/2013 11:44 AM   Modules accepted: Level of Service

## 2013-12-26 NOTE — Patient Instructions (Addendum)
1.  Will schedule another blood patch 2.  For pain, may take Fioricet.  Take twice daily as needed, but take no more than 2 days out of the week if possible. 3.  See eye doctor 4.  Will perform trigger point injection  5.  Follow up in 2 weeks.

## 2013-12-26 NOTE — Progress Notes (Signed)
NEUROLOGY FOLLOW UP OFFICE NOTE  Melissa Kirby [id number]  HISTORY OF PRESENT ILLNESS: Melissa Kirby is a 53 year old right-handed woman with history of migraine, B12 deficiency, hypothyroidism, kidney stones, depression and fibromyalgia who presents for headache. Notes and MRI of brain from 2015 and 2010 personally reviewed.  She is accompanied by her husband.  UPDATE: Had a lumbar blood patch performed 11/28/13, which helped for 3 days. CT myelogram was performed 12/22/13, which did not reveal evidence of a leak.  Still with daily headache, with severe stabbing and burning pain on top of head and behind eyes with Valsalva, or bending forward.  Only relief is laying down. Current management:  Increased fluids, salt intake, caffeine.  Already takes nortriptyline 139m.  HISTORY: Onset:  January-February 2015, coinciding with initiation of Effexor.  No trauma. Location:  Front to top of head.  Tenderness to left suboccipital region. Quality:  Stabbing with constant pressure Intensity:  Initially 10/10, now 4-5/10 since discontinuing Effexor Aura:  No Associated symptoms:  Slight horizontal double vision.  Aural fullness.  No nausea, vomiting, facial numbness, syncope.  Has chronic tingling in hands and feet related to fibromyalgia.  No rhinorrhea or fluid leak from ears to suggest skull based leak. Duration:  Initially up to 24 hours, now 30-60 minutes since discontinuation of Effexor Frequency:  Several times a day Triggers/exacerbating factors:  Change in position, such as standing up right after having bent forward, coughing, sneezing, Valsalva Relieving factors:  Laying supine helps (relieves the stabbing pain), resting, sleeping Activity:  Cannot function.  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  Off work at this time. Past abortive medication:  Promethazine with codeine (ineffective).  Now taking nothing  MRI of the brain was performed on  10/13/13, which revealed inferior displacement of the cerebellum and brainstem, with flattening of the ventral pons and diminished suprasellar cistern volume, as well as mild circumferential dural thickening, findings which may be seen in intracranial hypotension.  This was new compared to prior scan in 2010.  She presented to the ED on 10/15/13 with worsening headache.  She was given IV fluids, Dilaudid, and Benadryl, which improved the pain to 4/10.  She was evaluated by both neurology and neurosurgery, and was stable for discharge.  Effexor was tapered off and nortriptyline was increased to 1041mand indomethacin.  11/14/13 LP with opening pressure of 11 cmH20 and closing pressure less than 9 cmH20.  CSF cell count 6, glucose 58, protein 35, gram stain and culture negative. 11/23/13 MRI of the cervical, thoracic and lumbar spines with and without contrast did not reveal any post-contrast signal abnormality or location for CSF leak.  10/13/13 LABS:  TSH 0.19, B12 281, Vit D 25 hydroxy 18, ESR 35  Typical Migraines: Onset:  Since adolescence Location:  From back of head up to top.   Quality:  pounding Intensity:  10/10 Aura:  Halo effect just before onset of headache Associated symptoms:  Nausea, vomiting, photophobia, phonophobia, osmophobia Duration:  12-13 hours (one time it lasted 13 days), followed by scalp sensitivity the next day. Frequency:  Once every other month Triggers/exacerbating factors:  None Relieving factors:  Sleep.  Frequency improved following gluten-free diet. Activity:  Cannot function  Past abortive therapy:  Tylenol (ineffective), Imitrex (ineffective), Maxalt (ineffective), trigger point injections (ineffective), nasal spray (ineffective), Excedrin (ineffective), biofreeze (ineffective). Past preventative therapy:  topamax (was on it for years, effective)  Current abortive therapy:  ibuprofen Current preventative therapy:  Nortriptyline 117m  Caffeine:  Was drinking  Dr. PMalachi Bonds3x/day.  Now trying to cut back Depression/stress:  She has had significant increase in depression over the past several months.  Her father passed away unexpectedly on Thanksgiving.  She has had marital difficulties.  She left her husband but is now in marriage counseling. Sleep hygiene:  Sleeps better but not rested.  PAST MEDICAL HISTORY: Past Medical History  Diagnosis Date  . VITAMIN B12 DEFICIENCY 09/2009 dx  . Unspecified hypothyroidism 1982/2000    surgical resection of benign growth - partial then complete  . GERD (gastroesophageal reflux disease)     per endo. - does not take any meds.    .Marland KitchenMIGRAINE HEADACHE     last Migraine 06/29/2011-   . ANEMIA, IRON DEFICIENCY   . Kidney stones 1998, 2005    x 2  . Depression     therapy  . Fibromyalgia 1999    no meds    MEDICATIONS: Current Outpatient Prescriptions on File Prior to Visit  Medication Sig Dispense Refill  . Cyanocobalamin (VITAMIN B-12) 1000 MCG SUBL Place 1 tablet (1,000 mcg total) under the tongue daily.  100 tablet  3  . ergocalciferol (VITAMIN D2) 50000 UNITS capsule Take 50,000 Units by mouth once a week. Take 50,000 units on Wednesday      . indomethacin (INDOCIN) 25 MG capsule Take 1 capsule (25 mg total) by mouth 2 (two) times daily with a meal.  60 capsule  1  . levothyroxine (SYNTHROID, LEVOTHROID) 175 MCG tablet Take 175 mcg by mouth daily before breakfast.      . Multiple Vitamin (MULTIVITAMIN) tablet Take 1 tablet by mouth daily.       . nortriptyline (PAMELOR) 50 MG capsule Take 2 capsules (100 mg total) by mouth at bedtime.  60 capsule  2  . predniSONE (DELTASONE) 10 MG tablet Take 6tabs x1day, then 5tabs x1day, then 4tabs x1day, then 3tabs x1day, then 2tabs x1day, then 1tab x1day, then STOP  21 tablet  0   No current facility-administered medications on file prior to visit.    ALLERGIES: Allergies  Allergen Reactions  . Compazine Anaphylaxis    Hypotension  . Metoclopramide Hcl  Anaphylaxis    Hypotension  . Gluten Meal Other (See Comments)    Vitamin B irregulaties  . Morphine And Related Nausea Only    FAMILY HISTORY: Family History  Problem Relation Age of Onset  . Hyperlipidemia Mother   . Heart disease Mother   . Hyperlipidemia Father   . Breast cancer Maternal Grandmother   . Hyperlipidemia Maternal Grandmother   . Ovarian cancer Other   . Anesthesia problems Neg Hx   . Malignant hyperthermia Neg Hx   . Pseudochol deficiency Neg Hx   . Hypotension Neg Hx     SOCIAL HISTORY: History   Social History  . Marital Status: Married    Spouse Name: N/A    Number of Children: N/A  . Years of Education: N/A   Occupational History  . Not on file.   Social History Main Topics  . Smoking status: Never Smoker   . Smokeless tobacco: Not on file     Comment: Married, lives with spouse. 3 children. Employed at [employer], swing shift  . Alcohol Use: No  . Drug Use: No  . Sexual Activity: Yes    Birth Control/ Protection: Surgical     Comment: Married lives with spouse, 3 children. employed  at [employer], swing shift   Other Topics Concern  . Not on file   Social History Narrative  . No narrative on file    REVIEW OF SYSTEMS: Constitutional: No fevers, chills, or sweats, no generalized fatigue, change in appetite Eyes: No visual changes, double vision, eye pain Ear, nose and throat: No hearing loss, ear pain, nasal congestion, sore throat Cardiovascular: No chest pain, palpitations Respiratory:  No shortness of breath at rest or with exertion, wheezes GastrointestinaI: No nausea, vomiting, diarrhea, abdominal pain, fecal incontinence Genitourinary:  No dysuria, urinary retention or frequency Musculoskeletal:  No neck pain, back pain Integumentary: No rash, pruritus, skin lesions Neurological: as above Psychiatric: No depression, insomnia, anxiety Endocrine: No palpitations, fatigue, diaphoresis, mood swings, change  in appetite, change in weight, increased thirst Hematologic/Lymphatic:  No anemia, purpura, petechiae. Allergic/Immunologic: no itchy/runny eyes, nasal congestion, recent allergic reactions, rashes  PHYSICAL EXAM: Filed Vitals:   12/26/13 0900  BP: 130/96  Pulse: 68  Temp: 97.8 F (36.6 C)  Resp: 18   General: No acute distress Head:  Normocephalic/atraumatic Neck: supple, no paraspinal tenderness, full range of motion Heart:  Regular rate and rhythm Lungs:  Clear to auscultation bilaterally Back: No paraspinal tenderness Neurological Exam: alert and oriented to person, place, and time. Attention span and concentration intact, recent and remote memory intact, fund of knowledge intact.  Speech fluent and not dysarthric, language intact.  CN II-XII intact. Fundoscopic exam unremarkable.  Bulk and tone normal, muscle strength 5/5 throughout.  Sensation to temperature and vibration intact.  Deep tendon reflexes 2+ throughout, toes downgoing.  Finger to nose and heel to shin testing intact.  Gait normal, Romberg negative.  IMPRESSION: Daily headache, with MRI findings and clinical symptoms consistent with spontaneous intracranial hypotension.  Opening CSF pressure wasn't too remarkable (11 cm H2O)  PLAN: 1.  Will repeat blood patch since she had positive response before, although brief. 2.  Provide Fioricet 3.  Continue fluids and caffeine 4.  Will have eye exam 5.  Left suboccipital trigger point injection 6.  Follow up in 2 weeks.  Metta Clines, DO  CC:  Gwendolyn Grant, MD

## 2013-12-27 ENCOUNTER — Encounter: Payer: Self-pay | Admitting: Internal Medicine

## 2013-12-27 ENCOUNTER — Encounter: Payer: Self-pay | Admitting: *Deleted

## 2013-12-27 ENCOUNTER — Other Ambulatory Visit (INDEPENDENT_AMBULATORY_CARE_PROVIDER_SITE_OTHER): Payer: 59

## 2013-12-27 ENCOUNTER — Ambulatory Visit (INDEPENDENT_AMBULATORY_CARE_PROVIDER_SITE_OTHER): Payer: 59 | Admitting: Internal Medicine

## 2013-12-27 ENCOUNTER — Ambulatory Visit
Admission: RE | Admit: 2013-12-27 | Discharge: 2013-12-27 | Disposition: A | Discharge status: Home / Self Care | Payer: 59 | Source: Ambulatory Visit | Attending: Neurology | Admitting: Neurology

## 2013-12-27 VITALS — BP 110/82 | HR 84 | Temp 98.3°F | Wt 134.0 lb

## 2013-12-27 DIAGNOSIS — E039 Hypothyroidism, unspecified: Secondary | ICD-10-CM

## 2013-12-27 DIAGNOSIS — E559 Vitamin D deficiency, unspecified: Secondary | ICD-10-CM

## 2013-12-27 DIAGNOSIS — G971 Other reaction to spinal and lumbar puncture: Secondary | ICD-10-CM

## 2013-12-27 DIAGNOSIS — E538 Deficiency of other specified B group vitamins: Secondary | ICD-10-CM

## 2013-12-27 DIAGNOSIS — R9089 Other abnormal findings on diagnostic imaging of central nervous system: Secondary | ICD-10-CM

## 2013-12-27 DIAGNOSIS — R930 Abnormal findings on diagnostic imaging of skull and head, not elsewhere classified: Secondary | ICD-10-CM

## 2013-12-27 LAB — TSH: TSH: 0.09 u[IU]/mL — ABNORMAL LOW (ref 0.35–4.50)

## 2013-12-27 LAB — VITAMIN D 25 HYDROXY (VIT D DEFICIENCY, FRACTURES): VITD: 39.2 ng/mL

## 2013-12-27 LAB — VITAMIN B12: Vitamin B-12: 307 pg/mL (ref 211–911)

## 2013-12-27 MED ORDER — IOHEXOL 180 MG/ML  SOLN
1.0000 mL | Freq: Once | INTRAMUSCULAR | Status: AC | PRN
  Administered 2013-12-27: 1 mL via INTRAVENOUS

## 2013-12-27 MED ORDER — VITAMIN D 1000 UNITS PO TABS: 1000.0000 [IU] | ORAL_TABLET | Freq: Every day | ORAL | Status: DC

## 2013-12-27 NOTE — Patient Instructions (Signed)
It was good to see you today.  We have reviewed your prior records including labs and tests today  Medications reviewed and updated Goal Vit D intake: 2000U/day total no changes recommended at this time.  Test(s) ordered today. Your results will be released to MyChart (or called to you) after review, usually within 72hours after test completion. If any changes need to be made, you will be notified at that same time.  Continue working with Dr Everlena Cooper  We will extend your medical leave from April 9 through June 26 th.  Please schedule followup in 6 weeks, call sooner if problems.

## 2013-12-27 NOTE — Assessment & Plan Note (Signed)
10/13/13 MRI reviewed: IMPRESSION:  1. Constellation of findings most compatible with intracranial  hypotension. CSF opening pressure would confirm.  2. No other acute intracranial abnormality. Stable mild for age  nonspecific cerebral white matter changes since 2010.   ER eval 3/28 for continue HA - "IP" Nsurg and neuro eval reviewed -  OP neuro workup ongoing - reviewed today - for repeat blood patch  continue TCA and remain out of work until further action plan developed and symptoms improved (last day 3/24 in office - will extend another 30d now)

## 2013-12-27 NOTE — Progress Notes (Signed)
Blood drawn again for blood patch, 10 cc's obtained from left [facility] site is slightly bruised. Pt tolerated well.

## 2013-12-27 NOTE — Assessment & Plan Note (Signed)
S/p total thyroidectomy 2000 Last labs and dose changes reviewed Recheck now and adjust as needed The current medical regimen is effective;  continue present plan and medications.   Lab Results  Component Value Date   TSH 0.19* 10/13/2013

## 2013-12-27 NOTE — Assessment & Plan Note (Signed)
Treated with 6 weeks rx Vit d 50,000/wk in spring 2015 Recheck now Encouraged daily OTC vit D 2000U/day maintenance tx

## 2013-12-27 NOTE — Progress Notes (Signed)
Blood drawn from left [facility] for blood patch. Site is unremarkable and pt tolerated procedure well. 20 cc's obtained.

## 2013-12-27 NOTE — Discharge Instructions (Signed)
Blood Patch Discharge Instructions ° °1. Go home and rest quietly for the next 24 hours.  It is important to lie flat for the next 24 hours.  Get up only to go to the restroom.  You may lie in the bed or on a couch on your back, your stomach, your left side or your right side.  You may have one pillow under your head.  You may have pillows between your knees while you are on your side or under your knees while you are on your back. ° °2. DO NOT drive today.  Recline the seat as far back as it will go, while still wearing your seat belt, on the way home. ° °3. You may get up to go to the bathroom as needed.  You may sit up for 10 minutes to eat.  You may resume your normal diet and medications unless otherwise indicated.  Drink lots of extra fluids today and tomorrow ° °4. You may resume normal activities after your 24 hours of bed rest is over; however, do not exert yourself strongly or do any heavy lifting tomorrow. ° °5. Call your physician for a follow-up appointment.  ° °6. If you have any questions or if complications develop after you arrive home, please call [phone]. ° °Discharge instructions have been explained to the patient.  The patient, or the person responsible for the patient, fully understands these instructions. °

## 2013-12-27 NOTE — Progress Notes (Signed)
Pre visit review using our clinic review tool, if applicable. No additional management support is needed unless otherwise documented below in the visit note. 

## 2013-12-27 NOTE — Assessment & Plan Note (Signed)
Re-started B12 sl in 04/2012 -  recheck now and consider resuming IM if needed Lab Results  Component Value Date   VITAMINB12 281 10/13/2013

## 2013-12-27 NOTE — Progress Notes (Signed)
Subjective:    Patient ID: Melissa Kirby, female    DOB: [birth year], 53 y.o.   MRN: [id number]  HPI  Patient is here for follow up  Reviewed chronic medical issues and interval medical events  Past Medical History  Diagnosis Date  . VITAMIN B12 DEFICIENCY 09/2009 dx  . Unspecified hypothyroidism 1982/2000    surgical resection of benign growth - partial then complete  . GERD (gastroesophageal reflux disease)     per endo. - does not take any meds.    Marland Kitchen. MIGRAINE HEADACHE     last Migraine 06/29/2011-   . ANEMIA, IRON DEFICIENCY   . Kidney stones 1998, 2005    x 2  . Depression     therapy  . Fibromyalgia 1999    no meds    Review of Systems  Constitutional: Positive for fatigue. Negative for fever and unexpected weight change.  Respiratory: Negative for cough and shortness of breath.   Cardiovascular: Negative for chest pain, palpitations and leg swelling.  Neurological: Positive for weakness and headaches.       Objective:   Physical Exam  BP 110/82  Pulse 84  Temp(Src) 98.3 F (36.8 C) (Oral)  Wt 134 lb (60.782 kg)  SpO2 97% Wt Readings from Last 3 Encounters:  12/27/13 134 lb (60.782 kg)  12/26/13 135 lb (61.236 kg)  11/28/13 137 lb 3.2 oz (62.234 kg)   Constitutional: She appears fatigued, but well-developed and well-nourished. No distress. spouse at side Neck: Normal range of motion. Neck supple. No JVD present. No thyromegaly present.  Cardiovascular: Normal rate, regular rhythm and normal heart sounds.  No murmur heard. No BLE edema. Pulmonary/Chest: Effort normal and breath sounds normal. No respiratory distress. She has no wheezes.  Psychiatric: She has a normal mood and affect. Her behavior is normal. Judgment and thought content normal.   Lab Results  Component Value Date   WBC 5.9 10/13/2013   HGB 12.3 10/13/2013   HCT 36.7 10/13/2013   PLT 254.0 10/13/2013   GLUCOSE 93 10/13/2013   CHOL 214* 06/29/2013   TRIG 118.0 06/29/2013   HDL 55.80  06/29/2013   LDLDIRECT 145.1 06/29/2013   LDLCALC 93 10/03/2009   ALT 18 10/13/2013   AST 24 10/13/2013   NA 136 10/13/2013   K 4.4 10/13/2013   CL 104 10/13/2013   CREATININE 0.8 10/13/2013   BUN 8 10/13/2013   CO2 25 10/13/2013   TSH 0.19* 10/13/2013   INR 1.02 07/04/2011    Ct Cervical Spine W Contrast  12/22/2013   CLINICAL DATA:  Intracranial hypotension. Evaluate for CSF leak. Prior lumbar surgery.  EXAM: CT MYELOGRAPHY CERVICAL SPINE  TECHNIQUE: CT imaging of the cervical spine was performed after intrathecal contrast administration. Multiplanar CT image reconstructions were also generated.  COMPARISON:  None.  FINDINGS: Radiologist injection.  CSF contrast is well contained in the thecal sac. There is no evidence of extravasated contrast to suggest leak within the cervical spine or lower cranium. Tonsillar ectopia is 2.5 mm today. This is improved. No vertebral compression. Anatomic alignment.  C2-3:  Unremarkable.  C3-4: Mild posterior osteophytic ridging and disk bulge complex without compromise.  C4-5: Left paracentral disc protrusion reaches the cord. No spinal stenosis. Patent foramina.  C5-6:  Very minimal posterior disc bulge.  C6-7: Unremarkable.  C7-T1: Unremarkable.  Postoperative changes from thyroidectomy.  No evidence of neck mass.  IMPRESSION: No evidence of spine leak  Left paracentral disc protrusion at C4-5.   Electronically Signed  By: Maryclare Bean M.D.   On: 12/22/2013 10:01   Ct Thoracic Spine W Contrast  12/22/2013   CLINICAL DATA:  Evaluate for spine leak  EXAM: CT MYELOGRAPHY THORACIC AND LUMBAR SPINE  TECHNIQUE: CT imaging of the thoracic and lumbar spine was performed after intrathecal contrast administration. Multiplanar CT image reconstructions were also generated.  COMPARISON:  None.  FINDINGS: Radiologist injection.  Within the thoracic spine, there is anatomic alignment and no vertebral compression deformity. No evidence of spine block. No evidence of contrast extravasation  to suggest spine leak.  T7-T8: Tiny left paracentral protrusion.  T8-T9:  Shallow right paracentral and foraminal protrusion.  Visualized lungs are clear.  In the lumbar spine, there is anatomic alignment. Conus medullaris terminates at L1. Bilateral pedicle screws at L4 and L5 for fusion. No vertebral compression deformity. Small cyst in the upper pole of the right kidney. There is no evidence of contrast extravasation to suggest spine leak.  L1-2:  Unremarkable  L2-3:  Unremarkable.  L3-4: Shallow central protrusion without canal compromise. Foramina patent.  L4-5: Fusion hardware level. Bilateral L4-5 pedicle screws with cross stabilizing bars and a disc spacer. There is some callus across the disc space. There is also extensive callus across the right posterior elements. Bone graft material encroaches upon the left lateral recess. There is no evidence of the face min TdT of the left L5 nerve root sleeve. A left posterior laminectomy has been performed for decompression.  L5-S1: Moderate disc space narrowing. Central canal and foramina are patent.  IMPRESSION: No evidence of spine leak in the thoracic or lumbar spine. Specifically, no focal area of contrast extravasation to localize in area of a contrast leak from the thecal sac.  Mild degenerative disc disease in the mid thoracic spine.  Status post L4-5 fusion with evidence of fusion across the disc space and right posterior elements. No impingement.   Electronically Signed   By: Maryclare Bean M.D.   On: 12/22/2013 11:01   Ct Lumbar Spine W Contrast  12/22/2013   CLINICAL DATA:  Evaluate for spine leak  EXAM: CT MYELOGRAPHY THORACIC AND LUMBAR SPINE  TECHNIQUE: CT imaging of the thoracic and lumbar spine was performed after intrathecal contrast administration. Multiplanar CT image reconstructions were also generated.  COMPARISON:  None.  FINDINGS: Radiologist injection.  Within the thoracic spine, there is anatomic alignment and no vertebral compression  deformity. No evidence of spine block. No evidence of contrast extravasation to suggest spine leak.  T7-T8: Tiny left paracentral protrusion.  T8-T9:  Shallow right paracentral and foraminal protrusion.  Visualized lungs are clear.  In the lumbar spine, there is anatomic alignment. Conus medullaris terminates at L1. Bilateral pedicle screws at L4 and L5 for fusion. No vertebral compression deformity. Small cyst in the upper pole of the right kidney. There is no evidence of contrast extravasation to suggest spine leak.  L1-2:  Unremarkable  L2-3:  Unremarkable.  L3-4: Shallow central protrusion without canal compromise. Foramina patent.  L4-5: Fusion hardware level. Bilateral L4-5 pedicle screws with cross stabilizing bars and a disc spacer. There is some callus across the disc space. There is also extensive callus across the right posterior elements. Bone graft material encroaches upon the left lateral recess. There is no evidence of the face min TdT of the left L5 nerve root sleeve. A left posterior laminectomy has been performed for decompression.  L5-S1: Moderate disc space narrowing. Central canal and foramina are patent.  IMPRESSION: No evidence of spine leak  in the thoracic or lumbar spine. Specifically, no focal area of contrast extravasation to localize in area of a contrast leak from the thecal sac.  Mild degenerative disc disease in the mid thoracic spine.  Status post L4-5 fusion with evidence of fusion across the disc space and right posterior elements. No impingement.   Electronically Signed   By: Maryclare Bean M.D.   On: 12/22/2013 11:01   Dg Myelography Lumbar Inj Multi Region  12/22/2013   CLINICAL DATA:  Intracranial hypotension.  Spine leak suspected.  FLUOROSCOPY TIME:  dictate in minues and seconds  PROCEDURE: LUMBAR PUNCTURE FOR CERVICAL LUMBAR AND THORACIC MYELOGRAM  CERVICAL AND LUMBAR AND THORACIC MYELOGRAM  After thorough discussion of risks and benefits of the procedure including bleeding,  infection, injury to nerves, blood vessels, adjacent structures as well as headache and CSF leak, written and oral informed consent was obtained. Consent was obtained by Dr. Maryclare Bean.  Patient was positioned prone on the fluoroscopy table. Local anesthesia was provided with 1% lidocaine without epinephrine after prepped and draped in the usual sterile fashion. Puncture was performed at L2-3 using a 3 1/2 inch 22-gauge spinal needle via a right paramedian approach. Using a single pass through the dura, the needle was placed within the thecal sac, with return of clear CSF. 10 mL Omnipaque-300 was injected into the thecal sac, with normal opacification of the nerve roots and cauda equina consistent with free flow within the subarachnoid space. The patient was then moved to the trendelenburg position and contrast flowed into the Thoracic and Cervical spine regions.  I personally performed the lumbar puncture and administered the intrathecal contrast. I also personally supervised acquisition of the myelogram images.  TECHNIQUE: Contiguous axial images were obtained through the Cervical, Thoracic, and Lumbar spine after the intrathecal infusion of infusion. Coronal and sagittal reconstructions were obtained of the axial image sets.  FINDINGS: Anterior thecal contrast is documented. Bilateral pedicle screws in L4 and L5 for fusion with a disc spacer. No obvious spinal block or spinal stenosis. No obvious contrast extravasation.  IMPRESSION: Myelogram and preparation for CT in evaluation of suspected CSF leak.   Electronically Signed   By: Maryclare Bean M.D.   On: 12/22/2013 09:32       Assessment & Plan:   Problem List Items Addressed This Visit   Abnormal brain MRI     10/13/13 MRI reviewed: IMPRESSION:  1. Constellation of findings most compatible with intracranial  hypotension. CSF opening pressure would confirm.  2. No other acute intracranial abnormality. Stable mild for age  nonspecific cerebral white matter  changes since 2010.   ER eval 3/28 for continue HA - "IP" Nsurg and neuro eval reviewed -  OP neuro workup ongoing - reviewed today - for repeat blood patch  continue TCA and remain out of work until further action plan developed and symptoms improved (last day 3/24 in office - will extend another 30d now)     Unspecified hypothyroidism - Primary      S/p total thyroidectomy 2000 Last labs and dose changes reviewed Recheck now and adjust as needed The current medical regimen is effective;  continue present plan and medications.   Lab Results  Component Value Date   TSH 0.19* 10/13/2013      Relevant Orders      TSH   Unspecified vitamin D deficiency     Treated with 6 weeks rx Vit d 50,000/wk in spring 2015 Recheck now Encouraged daily OTC vit D  2000U/day maintenance tx    Relevant Orders      Vit D  25 hydroxy (rtn osteoporosis monitoring)   VITAMIN B12 DEFICIENCY      Re-started B12 sl in 04/2012 -  recheck now and consider resuming IM if needed Lab Results  Component Value Date   VITAMINB12 281 10/13/2013      Relevant Orders      Vitamin B12

## 2013-12-28 MED ORDER — LEVOTHYROXINE SODIUM 175 MCG PO TABS: 175.0000 ug | ORAL_TABLET | ORAL | Status: DC

## 2013-12-28 MED ORDER — CYANOCOBALAMIN 1000 MCG/ML IJ SOLN: 1000.0000 ug | INTRAMUSCULAR | Status: DC

## 2013-12-28 MED ORDER — LEVOTHYROXINE SODIUM 150 MCG PO TABS: 150.0000 ug | ORAL_TABLET | Freq: Every day | ORAL | Status: DC

## 2013-12-28 NOTE — Addendum Note (Signed)
Addended by: Rene Paci A on: 12/28/2013 06:18 AM   Modules accepted: Orders

## 2014-01-09 ENCOUNTER — Encounter: Payer: Self-pay | Admitting: Neurology

## 2014-01-09 ENCOUNTER — Other Ambulatory Visit: Payer: Self-pay | Admitting: Neurology

## 2014-01-09 ENCOUNTER — Ambulatory Visit (INDEPENDENT_AMBULATORY_CARE_PROVIDER_SITE_OTHER): Payer: 59 | Admitting: Neurology

## 2014-01-09 VITALS — BP 122/90 | HR 114 | Temp 98.0°F | Resp 20 | Ht 63.0 in | Wt 134.8 lb

## 2014-01-09 DIAGNOSIS — R51 Headache: Secondary | ICD-10-CM

## 2014-01-09 DIAGNOSIS — G9389 Other specified disorders of brain: Secondary | ICD-10-CM

## 2014-01-09 DIAGNOSIS — G96810 Intracranial hypotension, unspecified: Secondary | ICD-10-CM

## 2014-01-09 DIAGNOSIS — G971 Other reaction to spinal and lumbar puncture: Secondary | ICD-10-CM

## 2014-01-09 NOTE — Patient Instructions (Signed)
1.  We will send you back to Dr. Alfredo BattyMattern for another blood patch. 2.  Follow up in 2 weeks.  If not completely resolved, [facility]

## 2014-01-09 NOTE — Progress Notes (Signed)
NEUROLOGY FOLLOW UP OFFICE NOTE  Melissa Kirby [id number]  HISTORY OF PRESENT ILLNESS: Melissa Kirby is a 53 year old right-handed woman with history of migraine, B12 deficiency, hypothyroidism, kidney stones, depression and fibromyalgia who presents for headache. Notes and MRI of brain from 2015 and 2010 personally reviewed.  She is accompanied by her husband.  UPDATE: Underwent another blood patch on 12/27/13 by Dr. Jobe Igo at [facility].  Dr. Jobe Igo looked back at myelogram and noted contrast along the nerve sheath on the right at T7-8, suggesting possible epidural leak on the right at T7-8.  Lumbar blood patch was performed on the right at T7-8.  She reported very positive results for the first 10 days.  However, the headache began worsening since Friday.  Current management:  Increased fluids, salt intake, caffeine.  Already takes nortriptyline 151m.  HISTORY: Onset:  January-February 2015, coinciding with initiation of Effexor.  No trauma. Location:  Front to top of head.  Tenderness to left suboccipital region. Quality:  Stabbing with constant pressure Intensity:  Initially 10/10, now 4-5/10 since discontinuing Effexor Aura:  No Associated symptoms:  Slight horizontal double vision.  Aural fullness.  No nausea, vomiting, facial numbness, syncope.  Has chronic tingling in hands and feet related to fibromyalgia.  No rhinorrhea or fluid leak from ears to suggest skull based leak. Duration:  Initially up to 24 hours, now 30-60 minutes since discontinuation of Effexor Frequency:  Several times a day Triggers/exacerbating factors:  Change in position, such as standing up right after having bent forward, coughing, sneezing, Valsalva Relieving factors:  Laying supine helps (relieves the stabbing pain), resting, sleeping Activity:  Cannot function.  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  Off work at this time. Past abortive medication:   Promethazine with codeine (ineffective).  Now taking nothing  MRI of the brain was performed on 10/13/13, which revealed inferior displacement of the cerebellum and brainstem, with flattening of the ventral pons and diminished suprasellar cistern volume, as well as mild circumferential dural thickening, findings which may be seen in intracranial hypotension.  This was new compared to prior scan in 2010.  She presented to the ED on 10/15/13 with worsening headache.  She was given IV fluids, Dilaudid, and Benadryl, which improved the pain to 4/10.  She was evaluated by both neurology and neurosurgery, and was stable for discharge.  Effexor was tapered off and nortriptyline was increased to 1042mand indomethacin.  11/14/13 LP with opening pressure of 11 cmH20 and closing pressure less than 9 cmH20.  CSF cell count 6, glucose 58, protein 35, gram stain and culture negative. 11/23/13 MRI of the cervical, thoracic and lumbar spines with and without contrast did not reveal any post-contrast signal abnormality or location for CSF leak.  Had a lumbar blood patch performed 11/28/13, which helped for 3 days. CT myelogram was performed 12/22/13, which did not reveal evidence of a leak. 10/13/13 LABS:  TSH 0.19, B12 281, Vit D 25 hydroxy 18, ESR 35   Typical Migraines: Onset:  Since adolescence Location:  From back of head up to top.    Quality:  pounding Intensity:  10/10 Aura:  Halo effect just before onset of headache Associated symptoms:  Nausea, vomiting, photophobia, phonophobia, osmophobia Duration:  12-13 hours (one time it lasted 13 days), followed by scalp sensitivity the next day. Frequency:  Once every other month Triggers/exacerbating factors:  None Relieving factors:  Sleep.  Frequency improved following  gluten-free diet. Activity:  Cannot function  Past abortive therapy:  Tylenol (ineffective), Imitrex (ineffective), Maxalt (ineffective), trigger point injections (ineffective), nasal spray  (ineffective), Excedrin (ineffective), biofreeze (ineffective). Past preventative therapy:  topamax (was on it for years, effective)  Current abortive therapy:  ibuprofen Current preventative therapy:  Nortriptyline 154m  Caffeine:  Was drinking Dr. PMalachi Bonds3x/day.  Now trying to cut back Depression/stress:  She has had significant increase in depression over the past several months.  Her father passed away unexpectedly on Thanksgiving.  She has had marital difficulties.  She left her husband but is now in marriage counseling. Sleep hygiene:  Sleeps better but not rested.  PAST MEDICAL HISTORY: Past Medical History  Diagnosis Date  . VITAMIN B12 DEFICIENCY 09/2009 dx  . Unspecified hypothyroidism 1982/2000    surgical resection of benign growth - partial then complete  . GERD (gastroesophageal reflux disease)     per endo. - does not take any meds.    .Marland KitchenMIGRAINE HEADACHE     last Migraine 06/29/2011-   . ANEMIA, IRON DEFICIENCY   . Kidney stones 1998, 2005    x 2  . Depression     therapy  . Fibromyalgia 1999    no meds    MEDICATIONS: Current Outpatient Prescriptions on File Prior to Visit  Medication Sig Dispense Refill  . butalbital-acetaminophen-caffeine (FIORICET) 50-325-40 MG per tablet Take 1-2 tablets by mouth 2 (two) times daily as needed for headache.  30 tablet  0  . cholecalciferol (VITAMIN D) 1000 UNITS tablet Take 1-2 tablets (1,000-2,000 Units total) by mouth daily.      . cyanocobalamin (,VITAMIN B-12,) 1000 MCG/ML injection Inject 1 mL (1,000 mcg total) into the muscle every 30 (thirty) days.  1 mL  0  . indomethacin (INDOCIN) 25 MG capsule Take 1 capsule (25 mg total) by mouth 2 (two) times daily with a meal.  60 capsule  1  . levothyroxine (SYNTHROID, LEVOTHROID) 150 MCG tablet Take 1 tablet (150 mcg total) by mouth daily. On Tues, Wed, Thurs, Sat and Sun  90 tablet  3  . levothyroxine (SYNTHROID, LEVOTHROID) 175 MCG tablet Take 1 tablet (175 mcg total) by mouth  2 (two) times a week. On Monday and Friday      . Multiple Vitamin (MULTIVITAMIN) tablet Take 1 tablet by mouth daily.       . nortriptyline (PAMELOR) 50 MG capsule Take 2 capsules (100 mg total) by mouth at bedtime.  60 capsule  2   No current facility-administered medications on file prior to visit.    ALLERGIES: Allergies  Allergen Reactions  . Compazine Anaphylaxis    Hypotension  . Metoclopramide Hcl Anaphylaxis    Hypotension  . Gluten Meal Other (See Comments)    Vitamin B irregulaties  . Morphine And Related Nausea Only    FAMILY HISTORY: Family History  Problem Relation Age of Onset  . Hyperlipidemia Mother   . Heart disease Mother   . Hyperlipidemia Father   . Breast cancer Maternal Grandmother   . Hyperlipidemia Maternal Grandmother   . Ovarian cancer Other   . Anesthesia problems Neg Hx   . Malignant hyperthermia Neg Hx   . Pseudochol deficiency Neg Hx   . Hypotension Neg Hx     SOCIAL HISTORY: History   Social History  . Marital Status: Married    Spouse Name: N/A    Number of Children: N/A  . Years of Education: N/A   Occupational History  . Not  on file.   Social History Main Topics  . Smoking status: Never Smoker   . Smokeless tobacco: Not on file     Comment: Married, lives with spouse. 3 children. 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 Alcohol Use: No  . Drug Use: No  . Sexual Activity: Yes    Birth Control/ Protection: Surgical     Comment: Married lives with spouse, 3 children. 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 Not on file   Social History Narrative  . No narrative on file    REVIEW OF SYSTEMS: Constitutional: No fevers, chills, or sweats, no generalized fatigue, change in appetite Eyes: No visual changes, double vision, eye pain Ear, nose and throat: No hearing loss, ear pain, nasal congestion, sore throat Cardiovascular: No chest pain, palpitations Respiratory:  No shortness  of breath at rest or with exertion, wheezes GastrointestinaI: No nausea, vomiting, diarrhea, abdominal pain, fecal incontinence Genitourinary:  No dysuria, urinary retention or frequency Musculoskeletal:  No neck pain, back pain Integumentary: No rash, pruritus, skin lesions Neurological: as above Psychiatric: No depression, insomnia, anxiety Endocrine: No palpitations, fatigue, diaphoresis, mood swings, change in appetite, change in weight, increased thirst Hematologic/Lymphatic:  No anemia, purpura, petechiae. Allergic/Immunologic: no itchy/runny eyes, nasal congestion, recent allergic reactions, rashes  PHYSICAL EXAM: Filed Vitals:   01/09/14 1005  BP: 122/90  Pulse: 114  Temp: 98 F (36.7 C)  Resp: 20   General: No acute distress Head:  Normocephalic/atraumatic Neck: supple, no paraspinal tenderness, full range of motion Heart:  Regular rate and rhythm Lungs:  Clear to auscultation bilaterally Back: No paraspinal tenderness Neurological Exam: alert and oriented to person, place, and time. Attention span and concentration intact, recent and remote memory intact, fund of knowledge intact.  Speech fluent and not dysarthric, language intact.  CN II-XII intact. Fundoscopic exam unremarkable without vessel changes, exudates, hemorrhages or papilledema.  Bulk and tone normal, muscle strength 5/5 throughout.  Sensation to light touch, temperature and vibration intact.  Deep tendon reflexes 2+ throughout, toes downgoing.  Finger to nose and heel to shin testing intact.  Gait normal, Romberg negative.  IMPRESSION: Probable idiopathic intracranial hypotension due to spontaneous CSF leak on the right at T7-8.  PLAN: 1.  Will send for another blood patch at T7-8 on the right at [place] with Dr. Jobe Igo 2.  If repeat blood patch does not resolve this completely, then plan is to have her go to [facility] for specialist who performs fibrin glue sealant.  3.  Follow up in 2 weeks.  Metta Clines, DO  CC: Gwendolyn Grant, MD

## 2014-01-10 ENCOUNTER — Telehealth: Payer: Self-pay | Admitting: Internal Medicine

## 2014-01-10 NOTE — Telephone Encounter (Signed)
Patient is calling to ask that Dr. Felicity CoyerLeschber extend her leave of absence for work until at least July 7th. She states that she is scheduled to have another blood patch on this Friday, 6/26.  On July 7th she sees Dr. Everlena CooperJaffe to find out if [facility] Patient will pick-up letter when ready. Please advise.

## 2014-01-11 ENCOUNTER — Telehealth: Payer: Self-pay | Admitting: Neurology

## 2014-01-11 ENCOUNTER — Encounter: Payer: Self-pay | Admitting: *Deleted

## 2014-01-11 NOTE — Telephone Encounter (Signed)
Pt called to check up on this request, please call pt if this is ok

## 2014-01-11 NOTE — Telephone Encounter (Signed)
Pt needs a work note for school please call [phone]

## 2014-01-13 ENCOUNTER — Ambulatory Visit
Admission: RE | Admit: 2014-01-13 | Discharge: 2014-01-13 | Disposition: A | Discharge status: Home / Self Care | Payer: 59 | Source: Ambulatory Visit | Attending: Neurology | Admitting: Neurology

## 2014-01-13 DIAGNOSIS — G971 Other reaction to spinal and lumbar puncture: Secondary | ICD-10-CM

## 2014-01-13 MED ORDER — IOHEXOL 300 MG/ML  SOLN
1.0000 mL | Freq: Once | INTRAMUSCULAR | Status: AC | PRN
  Administered 2014-01-13: 1 mL via INTRAVENOUS

## 2014-01-13 NOTE — Telephone Encounter (Signed)
Called pt to relay message, no answer. From looking in her chart it looks like she has already received  letter from Neurology for this request. Will wait for pt to call back and confirm if she still needs.

## 2014-01-13 NOTE — Progress Notes (Signed)
[facility] is [facility]. Pt tolerated procedure well. Discharge instructions explained to pt.

## 2014-01-13 NOTE — Discharge Instructions (Signed)

## 2014-01-13 NOTE — Telephone Encounter (Signed)
Ok to extend date as requested

## 2014-01-19 ENCOUNTER — Telehealth: Payer: Self-pay | Admitting: Neurology

## 2014-01-19 NOTE — Telephone Encounter (Signed)
Pt called stating that [employer] needs Office notes for 01/09/14 and copy of out work note from 01/09/14-01/24/14 She would like for you to fax them to Fax [phone] C/b [phone]

## 2014-01-24 ENCOUNTER — Encounter: Payer: Self-pay | Admitting: Neurology

## 2014-01-24 ENCOUNTER — Encounter: Payer: Self-pay | Admitting: *Deleted

## 2014-01-24 ENCOUNTER — Ambulatory Visit (INDEPENDENT_AMBULATORY_CARE_PROVIDER_SITE_OTHER): Payer: 59 | Admitting: Neurology

## 2014-01-24 VITALS — BP 106/80 | HR 68 | Temp 98.2°F | Resp 18 | Wt 134.3 lb

## 2014-01-24 DIAGNOSIS — G9389 Other specified disorders of brain: Secondary | ICD-10-CM

## 2014-01-24 DIAGNOSIS — G96810 Intracranial hypotension, unspecified: Secondary | ICD-10-CM | POA: Insufficient documentation

## 2014-01-24 NOTE — Progress Notes (Signed)
NEUROLOGY FOLLOW UP OFFICE NOTE  Melissa Kirby [id number]  HISTORY OF PRESENT ILLNESS: Melissa Kirby is a 53 year old right-handed woman with history of migraine, B12 deficiency, hypothyroidism, kidney stones, depression and fibromyalgia who presents for headache. Notes and MRI of brain from 2015 and 2010 personally reviewed.  She is accompanied by her husband.  UPDATE: She underwent repeat blood patch at right T7-T8 region on 01/13/14.  She has had marked improvement.  She will still have a slight dull headache in the evening but no severe pain triggered by bending over or with Valsalva.  It good spirits and would like to return to work.  Current management:  Increased fluids, salt intake, caffeine.  Already takes nortriptyline 143m.  HISTORY: Onset:  January-February 2015, coinciding with initiation of Effexor.  No trauma. Location:  Front to top of head.  Tenderness to left suboccipital region. Quality:  Stabbing with constant pressure Intensity:  Initially 10/10, now 4-5/10 since discontinuing Effexor Aura:  No Associated symptoms:  Slight horizontal double vision.  Aural fullness.  No nausea, vomiting, facial numbness, syncope.  Has chronic tingling in hands and feet related to fibromyalgia.  No rhinorrhea or fluid leak from ears to suggest skull based leak. Duration:  Initially up to 24 hours, now 30-60 minutes since discontinuation of Effexor Frequency:  Several times a day Triggers/exacerbating factors:  Change in position, such as standing up right after having bent forward, coughing, sneezing, Valsalva Relieving factors:  Laying supine helps (relieves the stabbing pain), resting, sleeping Activity:  Cannot function.  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  Off work at this time. Past abortive medication:  Promethazine with codeine (ineffective).  Now taking nothing  MRI of the brain was performed on 10/13/13, which revealed inferior  displacement of the cerebellum and brainstem, with flattening of the ventral pons and diminished suprasellar cistern volume, as well as mild circumferential dural thickening, findings which may be seen in intracranial hypotension.  This was new compared to prior scan in 2010.  She presented to the ED on 10/15/13 with worsening headache.  She was given IV fluids, Dilaudid, and Benadryl, which improved the pain to 4/10.  She was evaluated by both neurology and neurosurgery, and was stable for discharge.  Effexor was tapered off and nortriptyline was increased to 1011mand indomethacin.  11/14/13 LP with opening pressure of 11 cmH20 and closing pressure less than 9 cmH20.  CSF cell count 6, glucose 58, protein 35, gram stain and culture negative. 11/23/13 MRI of the cervical, thoracic and lumbar spines with and without contrast did not reveal any post-contrast signal abnormality or location for CSF leak.  Had a lumbar blood patch performed 11/28/13, which helped for 3 days.  CT myelogram was performed 12/22/13, which showed contrast along the nerve sheath on the right at T7-8, suggesting possible epidural leak on the right at T7-8.    Underwent another blood patch on 12/27/13 [facility] MaJobe Igoooked back at myelogram and noted Lumbar blood patch was performed on the right at T7-8.  She reported very positive results for the first 10 days.  However, the headache started to return.    10/13/13 LABS:  TSH 0.19, B12 281, Vit D 25 hydroxy 18, ESR 35  Typical Migraines: Onset:  Since adolescence Location:  From back of head up to top.     Quality:  pounding Intensity:  10/10 Aura:  Halo effect just before onset of headache Associated symptoms:  Nausea, vomiting, photophobia, phonophobia, osmophobia Duration:  12-13 hours (one time it lasted 13 days), followed by scalp sensitivity the next day. Frequency:  Once every other month Triggers/exacerbating factors:  None Relieving  factors:  Sleep.  Frequency improved following gluten-free diet. Activity:  Cannot function  Past abortive therapy:  Tylenol (ineffective), Imitrex (ineffective), Maxalt (ineffective), trigger point injections (ineffective), nasal spray (ineffective), Excedrin (ineffective), biofreeze (ineffective). Past preventative therapy:  topamax (was on it for years, effective)  Current abortive therapy:  ibuprofen Current preventative therapy:  Nortriptyline 136m  Caffeine:  Was drinking Dr. PMalachi Bonds3x/day.  Now trying to cut back Depression/stress:  She has had significant increase in depression over the past several months.  Her father passed away unexpectedly on Thanksgiving.  She has had marital difficulties.  She left her husband but is now in marriage counseling. Sleep hygiene:  Sleeps better but not rested.  PAST MEDICAL HISTORY: Past Medical History  Diagnosis Date  . VITAMIN B12 DEFICIENCY 09/2009 dx  . Unspecified hypothyroidism 1982/2000    surgical resection of benign growth - partial then complete  . GERD (gastroesophageal reflux disease)     per endo. - does not take any meds.    .Marland KitchenMIGRAINE HEADACHE     last Migraine 06/29/2011-   . ANEMIA, IRON DEFICIENCY   . Kidney stones 1998, 2005    x 2  . Depression     therapy  . Fibromyalgia 1999    no meds    MEDICATIONS: Current Outpatient Prescriptions on File Prior to Visit  Medication Sig Dispense Refill  . butalbital-acetaminophen-caffeine (FIORICET) 50-325-40 MG per tablet Take 1-2 tablets by mouth 2 (two) times daily as needed for headache.  30 tablet  0  . cholecalciferol (VITAMIN D) 1000 UNITS tablet Take 1-2 tablets (1,000-2,000 Units total) by mouth daily.      . cyanocobalamin (,VITAMIN B-12,) 1000 MCG/ML injection Inject 1 mL (1,000 mcg total) into the muscle every 30 (thirty) days.  1 mL  0  . indomethacin (INDOCIN) 25 MG capsule Take 1 capsule (25 mg total) by mouth 2 (two) times daily with a meal.  60 capsule  1  .  levothyroxine (SYNTHROID, LEVOTHROID) 150 MCG tablet Take 1 tablet (150 mcg total) by mouth daily. On Tues, Wed, Thurs, Sat and Sun  90 tablet  3  . levothyroxine (SYNTHROID, LEVOTHROID) 175 MCG tablet Take 1 tablet (175 mcg total) by mouth 2 (two) times a week. On Monday and Friday      . Multiple Vitamin (MULTIVITAMIN) tablet Take 1 tablet by mouth daily.       . nortriptyline (PAMELOR) 50 MG capsule Take 2 capsules (100 mg total) by mouth at bedtime.  60 capsule  2   No current facility-administered medications on file prior to visit.    ALLERGIES: Allergies  Allergen Reactions  . Compazine Anaphylaxis    Hypotension  . Metoclopramide Hcl Anaphylaxis    Hypotension  . Gluten Meal Other (See Comments)    Vitamin B irregulaties  . Morphine And Related Nausea Only    FAMILY HISTORY: Family History  Problem Relation Age of Onset  . Hyperlipidemia Mother   . Heart disease Mother   . Hyperlipidemia Father   . Breast cancer Maternal Grandmother   . Hyperlipidemia Maternal Grandmother   . Ovarian cancer Other   . Anesthesia problems Neg Hx   . Malignant hyperthermia Neg Hx   . Pseudochol deficiency  Neg Hx   . Hypotension Neg Hx     SOCIAL HISTORY: History   Social History  . Marital Status: Married    Spouse Name: N/A    Number of Children: N/A  . Years of Education: N/A   Occupational History  . Not on file.   Social History Main Topics  . Smoking status: Never Smoker   . Smokeless tobacco: Not on file     Comment: Married, lives with spouse. 3 children. 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 Alcohol Use: No  . Drug Use: No  . Sexual Activity: Yes    Birth Control/ Protection: Surgical     Comment: Married lives with spouse, 3 children. 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 Not on file   Social History Narrative  . No narrative on file    REVIEW OF SYSTEMS: Constitutional: No fevers, chills, or sweats, no  generalized fatigue, change in appetite Eyes: No visual changes, double vision, eye pain Ear, nose and throat: No hearing loss, ear pain, nasal congestion, sore throat Cardiovascular: No chest pain, palpitations Respiratory:  No shortness of breath at rest or with exertion, wheezes GastrointestinaI: No nausea, vomiting, diarrhea, abdominal pain, fecal incontinence Genitourinary:  No dysuria, urinary retention or frequency Musculoskeletal:  No neck pain, back pain Integumentary: No rash, pruritus, skin lesions Neurological: as above Psychiatric: No depression, insomnia, anxiety Endocrine: No palpitations, fatigue, diaphoresis, mood swings, change in appetite, change in weight, increased thirst Hematologic/Lymphatic:  No anemia, purpura, petechiae. Allergic/Immunologic: no itchy/runny eyes, nasal congestion, recent allergic reactions, rashes  PHYSICAL EXAM: Filed Vitals:   01/24/14 0920  BP: 106/80  Pulse: 68  Temp: 98.2 F (36.8 C)  Resp: 18   General: No acute distress Head:  Normocephalic/atraumatic  IMPRESSION: Probable idiopathic intracranial hypotension due to spontaneous CSF leak on the right at T7-8.  PLAN: Continue to monitor.  She will followup in 3 months.  She will call with any problems. If headache returns, would refer to Dr[facility] Lorelle Gibbs at [facility] (as per Dr. Jobe Igo) for treatment of occult CSF leaks.  15 minutes that with the patient and her husband, 100% spent discussing her status and coordinating future plan if needed.  Metta Clines, DO  CC:  Gwendolyn Grant, MD  Lawrence Santiago, MD

## 2014-01-24 NOTE — Patient Instructions (Signed)
You may return to work.    Call with any problems.  Follow up in 3 months

## 2014-02-08 ENCOUNTER — Ambulatory Visit: Payer: Self-pay | Admitting: Internal Medicine

## 2014-03-07 ENCOUNTER — Encounter: Payer: Self-pay | Admitting: Internal Medicine

## 2014-03-07 ENCOUNTER — Ambulatory Visit (INDEPENDENT_AMBULATORY_CARE_PROVIDER_SITE_OTHER): Payer: 59 | Admitting: Internal Medicine

## 2014-03-07 VITALS — BP 112/74 | HR 83 | Temp 97.8°F | Ht 63.0 in | Wt 131.2 lb

## 2014-03-07 DIAGNOSIS — G43909 Migraine, unspecified, not intractable, without status migrainosus: Secondary | ICD-10-CM

## 2014-03-07 NOTE — Progress Notes (Signed)
Subjective:    Patient ID: Melissa Kirby, female    DOB: [birth year], 53 y.o.   MRN: [id number]  HPI  Patient is here for follow up  Reviewed chronic medical issues and interval medical events  Past Medical History  Diagnosis Date  . VITAMIN B12 DEFICIENCY 09/2009 dx  . Unspecified hypothyroidism 1982/2000    surgical resection of benign growth - partial then complete  . GERD (gastroesophageal reflux disease)     per endo. - does not take any meds.    Marland Kitchen MIGRAINE HEADACHE     last Migraine 06/29/2011-   . ANEMIA, IRON DEFICIENCY   . Kidney stones 1998, 2005    x 2  . Depression     therapy  . Fibromyalgia 1999    no meds    Review of Systems  Constitutional: Negative for fatigue and unexpected weight change.  Respiratory: Negative for cough and shortness of breath.   Cardiovascular: Negative for chest pain and leg swelling.  Neurological: Headaches: much improved, rare milder sx in past 4 weeks.       Objective:   Physical Exam  BP 112/74  Pulse 83  Temp(Src) 97.8 F (36.6 C) (Oral)  Ht 5\' 3"  (1.6 m)  Wt 131 lb 4 oz (59.535 kg)  BMI 23.26 kg/m2  SpO2 97% Wt Readings from Last 3 Encounters:  03/07/14 131 lb 4 oz (59.535 kg)  01/24/14 134 lb 4.8 oz (60.918 kg)  01/09/14 134 lb 12.8 oz (61.145 kg)   Constitutional: She appears well-developed and well-nourished. No distress.  Neck: Normal range of motion. Neck supple. No JVD present. No thyromegaly present.  Cardiovascular: Normal rate, regular rhythm and normal heart sounds.  No murmur heard. No BLE edema. Pulmonary/Chest: Effort normal and breath sounds normal. No respiratory distress. She has no wheezes.  Psychiatric: She has a normal mood and affect. Her behavior is normal. Judgment and thought content normal.   Lab Results  Component Value Date   WBC 5.9 10/13/2013   HGB 12.3 10/13/2013   HCT 36.7 10/13/2013   PLT 254.0 10/13/2013   GLUCOSE 93 10/13/2013   CHOL 214* 06/29/2013   TRIG 118.0 06/29/2013   HDL  55.80 06/29/2013   LDLDIRECT 145.1 06/29/2013   LDLCALC 93 10/03/2009   ALT 18 10/13/2013   AST 24 10/13/2013   NA 136 10/13/2013   K 4.4 10/13/2013   CL 104 10/13/2013   CREATININE 0.8 10/13/2013   BUN 8 10/13/2013   CO2 25 10/13/2013   TSH 0.09* 12/27/2013   INR 1.02 07/04/2011    Dg Epidurography  01/13/2014   CLINICAL DATA:  Focal CSF leak on the right at T7-8. Patient had a positive response to previous trans foraminal blood patch with relief of headaches over 10 days. The headaches have slowly recurred.  EXAM: BLOOD PATCH  EPIDUROGRAM S+I  FLUOROSCOPY TIME:  1 min 28 seconds  PROCEDURE: The procedure, risks, benefits, and alternatives were explained to the patient. Questions regarding the procedure were encouraged and answered. The patient understands and consents to the procedure.  THORACIC TRANSFORAMINAL EPIDURAL BLOOD PATCH: 10 ml of blood were withdrawn from the patient's antecubital fossa. An trans foraminal approach was taken on theright at T7-8 using a 22 gauge spinal needle. Epidural positioning was confirmed by injecting a small amount of Omnipaque 180. There was no vascular communication. 5 ml of the patient's blood was slowly injected into the epidural space in this location. The procedure was well-tolerated and she was  discharged in good condition with instructions to lie down for additional day.  COMPLICATIONS: None  IMPRESSION: Thoracic trans foraminal epidural blood patch on theright at T7-8  If the patient continues to have incomplete relief from her headaches, I would consider referral [facility] and treatment given her a special expertise an this area of occult CSF leaks.   Electronically Signed   By: Gennette Pachris  Mattern M.D.   On: 01/13/2014 11:24       Assessment & Plan:   Problem List Items Addressed This Visit   MIGRAINE HEADACHE - Primary     10/13/13 MRI reviewed: IMPRESSION:  1. Constellation of findings most compatible with  intracranial  hypotension. CSF opening pressure would confirm.  2. No other acute intracranial abnormality. Stable mild for age  nonspecific cerebral white matter changes since 2010.   ER eval 3/28 for worsening "new" HA -  Interval Nsurg and neuro eval in ED and OP neuro workup ongoing -  reviewed today - repeat blood patches currently symptoms improved follow up as planned, tertiary specialist if recurrent symptoms   continue TCA as for chronic migraine symptoms

## 2014-03-07 NOTE — Patient Instructions (Signed)
It was good to see you today.  We have reviewed your prior records including labs and tests today  Medications reviewed and updated, no changes recommended at this time.  Please schedule followup in 3-4 months, call sooner if problems.

## 2014-03-07 NOTE — Progress Notes (Signed)
Pre visit review using our clinic review tool, if applicable. No additional management support is needed unless otherwise documented below in the visit note. 

## 2014-03-07 NOTE — Assessment & Plan Note (Signed)
10/13/13 MRI reviewed: IMPRESSION:  1. Constellation of findings most compatible with intracranial  hypotension. CSF opening pressure would confirm.  2. No other acute intracranial abnormality. Stable mild for age  nonspecific cerebral white matter changes since 2010.   ER eval 3/28 for worsening "new" HA -  Interval Nsurg and neuro eval in ED and OP neuro workup ongoing -  reviewed today - repeat blood patches currently symptoms improved follow up as planned, tertiary specialist if recurrent symptoms   continue TCA as for chronic migraine symptoms

## 2014-04-24 ENCOUNTER — Telehealth: Payer: Self-pay | Admitting: Neurology

## 2014-04-24 NOTE — Telephone Encounter (Signed)
Pt called and resch appt from 04-26-14 to 05-03-14

## 2014-04-26 ENCOUNTER — Other Ambulatory Visit: Payer: Self-pay

## 2014-04-26 ENCOUNTER — Ambulatory Visit: Payer: Self-pay | Admitting: Neurology

## 2014-04-26 MED ORDER — NORTRIPTYLINE HCL 50 MG PO CAPS: 100.0000 mg | ORAL_CAPSULE | Freq: Every day | ORAL | Status: DC

## 2014-04-27 ENCOUNTER — Encounter: Payer: Self-pay | Admitting: Internal Medicine

## 2014-04-27 ENCOUNTER — Ambulatory Visit (INDEPENDENT_AMBULATORY_CARE_PROVIDER_SITE_OTHER): Payer: 59 | Admitting: Internal Medicine

## 2014-04-27 VITALS — BP 130/90 | HR 105 | Temp 98.2°F | Wt 134.5 lb

## 2014-04-27 DIAGNOSIS — J029 Acute pharyngitis, unspecified: Secondary | ICD-10-CM

## 2014-04-27 MED ORDER — AMOXICILLIN 500 MG PO CAPS: 500.0000 mg | ORAL_CAPSULE | Freq: Three times a day (TID) | ORAL | Status: DC

## 2014-04-27 NOTE — Patient Instructions (Addendum)
Plain Mucinex (NOT D) for thick secretions ;force NON dairy fluids .   Nasal cleansing in the shower as discussed with lather of mild shampoo.After 10 seconds wash off lather while  exhaling through nostrils. Make sure that all residual soap is removed to prevent irritation.  Flonase OR Nasacort AQ 1 spray in each nostril twice a day as needed. Use the "crossover" technique into opposite nostril spraying toward opposite ear @ 45 degree angle, not straight up into nostril.  Use a Neti pot daily only  as needed for significant sinus congestion; going from open side to congested side . Plain Allegra (NOT D )  160 daily , Loratidine 10 mg , OR Zyrtec 10 mg @ bedtime  as needed for itchy eyes & sneezing.  Zicam Melts or Zinc lozenges as per package label for sore throat . Complementary options include  vitamin C 2000 mg daily; & Echinacea for 4-7 days. Report persistent or progressive fever; discolored nasal or chest secretions; or frontal headache or facial  pain.   Fill the  prescription for antibiotic it there is not dramatic improvement in the next  48-72 hours.     

## 2014-04-27 NOTE — Progress Notes (Signed)
   Subjective:    Patient ID: Melissa Kirby, female    DOB: 1961/05/21, 53 y.o.   MRN: [id number]  HPI  Symptoms began 04/25/14 as a sore throat, head congestion, postnasal drainage  She also had fever subjectively. She did not actually measure her temperature  She took nonsteroidals with minimal benefit  She has some pressure/fullness in the left ear with decreased or muffled hearing.   Significant past history includes chronic migraines; she also has a chronic cerebrospinal fluid leak. She has been followed by a neurologist.   Review of Systems   She denies frontal sinus pain, facial pain, dental pain, or nasal purulence.  She has no associated cough, sputum production, wheezing, or shortness of breath  There no other significant extrinsic symptoms.     Objective:   Physical Exam General appearance:good health ;well nourished; no acute distress or increased work of breathing is present.  No  lymphadenopathy about the head, neck, or axilla noted.   Eyes: No conjunctival inflammation or lid edema is present. There is no scleral icterus.  Ears:  External ear exam shows no significant lesions or deformities.  Otoscopic examination reveals clear canals, tympanic membranes are intact bilaterally without bulging, retraction, inflammation or discharge.  Nose:  External nasal examination shows no deformity or inflammation. Nasal mucosa are dryist without lesions or exudates. No septal dislocation or deviation.No obstruction to airflow.   Oral exam: Dental hygiene is good; lips and gums are healthy appearing.There is no oropharyngeal erythema or exudate noted.   Neck:  No deformities, thyromegaly, masses, or tenderness noted.   Supple with full range of motion without pain.   Heart:  Normal rate and regular rhythm. S1 and S2 normal without gallop, murmur, click, rub or other extra sounds. Recheck: pulse 78  Lungs:Chest clear to auscultation; no wheezes, rhonchi,rales ,or rubs present.No  increased work of breathing.    Extremities:  No cyanosis, edema, or clubbing  noted    Skin: Warm & dry w/o jaundice or tenting.         Assessment & Plan:  #1 upper respiratory infection with pharyngitis, probably viral Plan: See orders and recommendations

## 2014-04-27 NOTE — Progress Notes (Signed)
Pre visit review using our clinic review tool, if applicable. No additional management support is needed unless otherwise documented below in the visit note. 

## 2014-05-03 ENCOUNTER — Encounter: Payer: Self-pay | Admitting: Neurology

## 2014-05-03 ENCOUNTER — Ambulatory Visit (INDEPENDENT_AMBULATORY_CARE_PROVIDER_SITE_OTHER): Payer: 59 | Admitting: Neurology

## 2014-05-03 VITALS — BP 118/84 | HR 75 | Ht 63.0 in | Wt 135.4 lb

## 2014-05-03 DIAGNOSIS — G9600 Cerebrospinal fluid leak, unspecified: Secondary | ICD-10-CM

## 2014-05-03 DIAGNOSIS — G96810 Intracranial hypotension, unspecified: Secondary | ICD-10-CM

## 2014-05-03 DIAGNOSIS — G9389 Other specified disorders of brain: Secondary | ICD-10-CM

## 2014-05-03 NOTE — Patient Instructions (Addendum)
1.  We will refer you to [facility] for [facility] of any spinal procedure to correct the leak. 2.  In the meantime, continue hydration, caffeine and trying to lay down as much as possible. 3.  Follow up after evaluation. 4. MRI of Brain  05/10/14 at 6:10pm  [facility]  Disc was requested

## 2014-05-03 NOTE — Addendum Note (Signed)
Addended by: Glean SalenVAN DER GLAS, Benedicto Capozzi E on: 05/03/2014 10:22 AM   Modules accepted: Orders

## 2014-05-03 NOTE — Progress Notes (Addendum)
NEUROLOGY FOLLOW UP OFFICE NOTE  [name] [id number]  HISTORY OF PRESENT ILLNESS: Shawnte Kirby is a 53 year old right-handed woman with history of migraine, B12 deficiency, hypothyroidism, kidney stones, depression and fibromyalgia who follows up for idiopathic intracranial hypotension.   UPDATE: In summary, the CT myelogram revealed possible epidural leak on the right at T7-T8.  She underwent a blood patch on 01/13/14, and she was headache-free for almost 3 months.  However, a month ago, she began experiencing the pressure in the back of her head again, which became exacerbated with valsalva, standing and bending forward.  She tries to lay flat on her back whenever she can.  She works as a Scientist, clinical (histocompatibility and immunogenetics) for [employer], so she tries to not stand too much and not lean forward. Taking nortriptyline 125m  HISTORY: Onset:  January-February 2015, coinciding with initiation of Effexor.  No trauma. Location:  Front to top of head.  Tenderness to left suboccipital region. Quality:  Stabbing with constant pressure Intensity:  Initially 10/10, now 4-5/10 since discontinuing Effexor Aura:  No Associated symptoms:  Slight horizontal double vision.  Aural fullness.  No nausea, vomiting, facial numbness, syncope.  Has chronic tingling in hands and feet related to fibromyalgia.  No rhinorrhea or fluid leak from ears to suggest skull based leak. Duration:  Initially up to 24 hours, now 30-60 minutes since discontinuation of Effexor Frequency:  Several times a day Triggers/exacerbating factors:  Change in position, such as standing up right after having bent forward, coughing, sneezing, Valsalva Relieving factors:  Laying supine helps (relieves the stabbing pain), resting, sleeping Activity:  Cannot function.  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  Off work at this time. Past abortive medication:  Promethazine with codeine (ineffective).  Now taking  nothing  MRI of the brain was performed on 10/13/13, which revealed inferior displacement of the cerebellum and brainstem, with flattening of the ventral pons and diminished suprasellar cistern volume, as well as mild circumferential dural thickening, findings which may be seen in intracranial hypotension.  This was new compared to prior scan in 2010.  She presented to the ED on 10/15/13 with worsening headache.  She was given IV fluids, Dilaudid, and Benadryl, which improved the pain to 4/10.  She was evaluated by both neurology and neurosurgery, and was stable for discharge.  Effexor was tapered off and nortriptyline was increased to 1045mand indomethacin.  11/14/13 LP with opening pressure of 11 cmH20 and closing pressure less than 9 cmH20.  CSF cell count 6, glucose 58, protein 35, gram stain and culture negative. 11/23/13 MRI of the cervical, thoracic and lumbar spines with and without contrast did not reveal any post-contrast signal abnormality or location for CSF leak.  Had a lumbar blood patch performed 11/28/13, which helped for 3 days.  CT myelogram was performed 12/22/13, which showed contrast along the nerve sheath on the right at T7-8, suggesting possible epidural leak on the right at T7-8.    Underwent another blood patch on 12/27/13 [facility] MaJobe Igoooked back at myelogram and noted Lumbar blood patch was performed on the right at T7-8.  She reported very positive results for the first 10 days.  However, the headache started to return.  She underwent repeat blood patch at right T7-T8 region on 01/13/14.  She has had marked improvement.  She will still have a slight dull headache in the evening  but no severe pain triggered by bending over or with Valsalva.  It good spirits and would like to return to work.  10/13/13 LABS:  TSH 0.19, B12 281, Vit D 25 hydroxy 18, ESR 35  Typical Migraines: Onset:  Since adolescence Location:  From back of head up to top.       Quality:  pounding Intensity:  10/10 Aura:  Halo effect just before onset of headache Associated symptoms:  Nausea, vomiting, photophobia, phonophobia, osmophobia Duration:  12-13 hours (one time it lasted 13 days), followed by scalp sensitivity the next day. Frequency:  Once every other month Triggers/exacerbating factors:  None Relieving factors:  Sleep.  Frequency improved following gluten-free diet. Activity:  Cannot function Past abortive therapy:  Tylenol (ineffective), Imitrex (ineffective), Maxalt (ineffective), trigger point injections (ineffective), nasal spray (ineffective), Excedrin (ineffective), biofreeze (ineffective). Past preventative therapy:  topamax (was on it for years, effective) Current abortive therapy:  ibuprofen Current preventative therapy:  Nortriptyline 170m  PAST MEDICAL HISTORY: Past Medical History  Diagnosis Date  . VITAMIN B12 DEFICIENCY 09/2009 dx  . Unspecified hypothyroidism 1982/2000    surgical resection of benign growth - partial then complete  . GERD (gastroesophageal reflux disease)     per endo. - does not take any meds.    .Marland KitchenMIGRAINE HEADACHE     last Migraine 06/29/2011-   . ANEMIA, IRON DEFICIENCY   . Kidney stones 1998, 2005    x 2  . Depression     therapy  . Fibromyalgia 1999    no meds    MEDICATIONS: Current Outpatient Prescriptions on File Prior to Visit  Medication Sig Dispense Refill  . amoxicillin (AMOXIL) 500 MG capsule Take 1 capsule (500 mg total) by mouth 3 (three) times daily. Fill the  prescription for antibiotic it there is not dramatic improvement in the next  48-72 hours.  21 capsule  0  . cholecalciferol (VITAMIN D) 1000 UNITS tablet Take 1-2 tablets (1,000-2,000 Units total) by mouth daily.      . cyanocobalamin (,VITAMIN B-12,) 1000 MCG/ML injection Inject 1 mL (1,000 mcg total) into the muscle every 30 (thirty) days.  1 mL  0  . levothyroxine (SYNTHROID, LEVOTHROID) 150 MCG tablet Take 1 tablet (150 mcg total)  by mouth daily. On Tues, Wed, Thurs, Sat and Sun  90 tablet  3  . levothyroxine (SYNTHROID, LEVOTHROID) 175 MCG tablet Take 1 tablet (175 mcg total) by mouth 2 (two) times a week. On Monday and Friday      . Multiple Vitamin (MULTIVITAMIN) tablet Take 1 tablet by mouth daily.       . nortriptyline (PAMELOR) 50 MG capsule Take 2 capsules (100 mg total) by mouth at bedtime.  60 capsule  2   No current facility-administered medications on file prior to visit.    ALLERGIES: Allergies  Allergen Reactions  . Compazine Anaphylaxis    Hypotension  . Metoclopramide Hcl Anaphylaxis    Hypotension  . Gluten Meal Other (See Comments)    Vitamin B irregulaties  . Morphine And Related Nausea Only    FAMILY HISTORY: Family History  Problem Relation Age of Onset  . Hyperlipidemia Mother   . Heart disease Mother   . Hyperlipidemia Father   . Breast cancer Maternal Grandmother   . Hyperlipidemia Maternal Grandmother   . Ovarian cancer Other   . Anesthesia problems Neg Hx   . Malignant hyperthermia Neg Hx   . Pseudochol deficiency Neg Hx   . Hypotension Neg Hx  SOCIAL HISTORY: History   Social History  . Marital Status: Married    Spouse Name: N/A    Number of Children: N/A  . Years of Education: N/A   Occupational History  . Not on file.   Social History Main Topics  . Smoking status: Never Smoker   . Smokeless tobacco: Not on file     Comment: Married, lives with spouse. 3 children. 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 Alcohol Use: No  . Drug Use: No  . Sexual Activity: Yes    Birth Control/ Protection: Surgical     Comment: Married lives with spouse, 3 children. 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 Not on file   Social History Narrative   Lives with husband in a one story home.  Works at [employer].  Education: high school 12th grade.    REVIEW OF SYSTEMS: Constitutional: No fevers, chills, or sweats, no  generalized fatigue, change in appetite Eyes: No visual changes, double vision, eye pain Ear, nose and throat: No hearing loss, ear pain, nasal congestion, sore throat Cardiovascular: No chest pain, palpitations Respiratory:  No shortness of breath at rest or with exertion, wheezes GastrointestinaI: No nausea, vomiting, diarrhea, abdominal pain, fecal incontinence Genitourinary:  No dysuria, urinary retention or frequency Musculoskeletal:  No neck pain, back pain Integumentary: No rash, pruritus, skin lesions Neurological: as above Psychiatric: No depression, insomnia, anxiety Endocrine: No palpitations, fatigue, diaphoresis, mood swings, change in appetite, change in weight, increased thirst Hematologic/Lymphatic:  No anemia, purpura, petechiae. Allergic/Immunologic: no itchy/runny eyes, nasal congestion, recent allergic reactions, rashes  PHYSICAL EXAM: Filed Vitals:   05/03/14 0802  BP: 118/84  Pulse: 75   General: No acute distress Head:  Normocephalic/atraumatic Neck: supple, no paraspinal tenderness, full range of motion Heart:  Regular rate and rhythm Lungs:  Clear to auscultation bilaterally Back: No paraspinal tenderness Neurological Exam: alert and oriented to person, place, and time. Attention span and concentration intact, recent and remote memory intact, fund of knowledge intact.  Speech fluent and not dysarthric, language intact.  CN II-XII intact. Fundoscopic exam unremarkable without vessel changes, exudates, hemorrhages or papilledema.  Bulk and tone normal, muscle strength 5/5 throughout.  Sensation to light touch, temperature and vibration intact.  Deep tendon reflexes 2+ throughout, toes downgoing.  Finger to nose and heel to shin testing intact.  Gait normal, Romberg negative.  IMPRESSION: Idiopathic intracranial hypotension secondary to spontaneous CSF leak at T7-T8.  She had significant improvement with blood patch, however the headache has gradually  returned.  PLAN: 1.  At this point, we will refer her to Dr. Lorelle Gibbs, neuroradiologist at [facility] for evaluation and possible intervention.  We will first get a follow-up MRI of the brain with and without contrast. 2.  Continue hydration, caffeine and trying to lay down as much as possible 3.  Follow up after evaluation  15 minutes spent with patient, over 50% spent discussing coordination of care.  Metta Clines, DO  CC:  Gwendolyn Grant, MD

## 2014-05-10 ENCOUNTER — Ambulatory Visit
Admission: RE | Admit: 2014-05-10 | Discharge: 2014-05-10 | Disposition: A | Discharge status: Home / Self Care | Payer: 59 | Source: Ambulatory Visit | Attending: Neurology | Admitting: Neurology

## 2014-05-10 DIAGNOSIS — G96810 Intracranial hypotension, unspecified: Secondary | ICD-10-CM

## 2014-05-10 DIAGNOSIS — G9600 Cerebrospinal fluid leak, unspecified: Secondary | ICD-10-CM

## 2014-05-10 DIAGNOSIS — G9389 Other specified disorders of brain: Principal | ICD-10-CM

## 2014-05-10 MED ORDER — GADOBENATE DIMEGLUMINE 529 MG/ML IV SOLN
12.0000 mL | Freq: Once | INTRAVENOUS | Status: AC | PRN
  Administered 2014-05-10: 12 mL via INTRAVENOUS

## 2014-05-16 ENCOUNTER — Telehealth: Payer: Self-pay | Admitting: *Deleted

## 2014-05-16 NOTE — Telephone Encounter (Signed)
Message copied by Fredirick MaudlinVAN DER GLAS, Sincere Liuzzi E on Tue May 16, 2014  2:34 PM ------      Message from: Shary DecampLOYE, ANDREA S      Created: Tue May 16, 2014 12:31 PM      Regarding: [facility]      Contact: [phone]       Melissa Kirby called wanting to confirm if the MRI w/ contrast of the brain [facility].      Please call him back # [phone] ------

## 2014-06-01 ENCOUNTER — Telehealth: Payer: Self-pay | Admitting: *Deleted

## 2014-06-01 ENCOUNTER — Telehealth: Payer: Self-pay | Admitting: Neurology

## 2014-06-01 NOTE — Telephone Encounter (Signed)
Patient is aware she needs to get a copy of her myelogram and LP and overnight to [facility] to the address Tawni CarnesHorace has emailed to her

## 2014-06-01 NOTE — Telephone Encounter (Signed)
Horace from [facility] called and states that they have not gotten the images and wants to know the status she has appt to see them on 06-09-14 please call him at [phone]

## 2014-06-10 ENCOUNTER — Emergency Department (HOSPITAL_COMMUNITY)
Admission: EM | Admit: 2014-06-10 | Discharge: 2014-06-10 | Disposition: A | Discharge status: Home / Self Care | Payer: 59 | Attending: Emergency Medicine | Admitting: Emergency Medicine

## 2014-06-10 ENCOUNTER — Encounter (HOSPITAL_COMMUNITY): Payer: Self-pay | Admitting: Emergency Medicine

## 2014-06-10 DIAGNOSIS — Z87442 Personal history of urinary calculi: Secondary | ICD-10-CM | POA: Insufficient documentation

## 2014-06-10 DIAGNOSIS — Z981 Arthrodesis status: Secondary | ICD-10-CM | POA: Diagnosis not present

## 2014-06-10 DIAGNOSIS — G43909 Migraine, unspecified, not intractable, without status migrainosus: Secondary | ICD-10-CM | POA: Diagnosis not present

## 2014-06-10 DIAGNOSIS — Z8719 Personal history of other diseases of the digestive system: Secondary | ICD-10-CM | POA: Diagnosis not present

## 2014-06-10 DIAGNOSIS — G971 Other reaction to spinal and lumbar puncture: Secondary | ICD-10-CM | POA: Diagnosis not present

## 2014-06-10 DIAGNOSIS — E039 Hypothyroidism, unspecified: Secondary | ICD-10-CM | POA: Diagnosis not present

## 2014-06-10 DIAGNOSIS — F329 Major depressive disorder, single episode, unspecified: Secondary | ICD-10-CM | POA: Insufficient documentation

## 2014-06-10 DIAGNOSIS — R51 Headache: Secondary | ICD-10-CM | POA: Diagnosis present

## 2014-06-10 DIAGNOSIS — D509 Iron deficiency anemia, unspecified: Secondary | ICD-10-CM | POA: Insufficient documentation

## 2014-06-10 DIAGNOSIS — Z79899 Other long term (current) drug therapy: Secondary | ICD-10-CM | POA: Insufficient documentation

## 2014-06-10 DIAGNOSIS — D519 Vitamin B12 deficiency anemia, unspecified: Secondary | ICD-10-CM | POA: Insufficient documentation

## 2014-06-10 MED ORDER — HYDROMORPHONE HCL 1 MG/ML IJ SOLN
1.0000 mg | Freq: Once | INTRAMUSCULAR | Status: AC
  Administered 2014-06-10: 1 mg via INTRAVENOUS
  Filled 2014-06-10: qty 1

## 2014-06-10 MED ORDER — PROMETHAZINE HCL 25 MG/ML IJ SOLN
12.5000 mg | Freq: Once | INTRAMUSCULAR | Status: AC
  Administered 2014-06-10: 12.5 mg via INTRAVENOUS
  Filled 2014-06-10: qty 1

## 2014-06-10 MED ORDER — HYDROMORPHONE HCL 1 MG/ML IJ SOLN
0.5000 mg | Freq: Once | INTRAMUSCULAR | Status: AC
  Administered 2014-06-10: 0.5 mg via INTRAVENOUS
  Filled 2014-06-10: qty 1

## 2014-06-10 MED ORDER — SODIUM CHLORIDE 0.9 % IV BOLUS (SEPSIS)
2000.0000 mL | Freq: Once | INTRAVENOUS | Status: AC
  Administered 2014-06-10: 2000 mL via INTRAVENOUS

## 2014-06-10 MED ORDER — ONDANSETRON HCL 4 MG/2ML IJ SOLN
4.0000 mg | Freq: Once | INTRAMUSCULAR | Status: AC
  Administered 2014-06-10: 4 mg via INTRAVENOUS
  Filled 2014-06-10: qty 2

## 2014-06-10 NOTE — ED Notes (Signed)
Pt given Percocet and Zofran yesterday for home use. Pt vomiting at present. Pt reports severe headache. Pt [facility] CT [facility] yesterday [facility],

## 2014-06-10 NOTE — Discharge Instructions (Signed)
Continue your medications as before.  Follow-up with your physician as needed if symptoms return.   Spinal Headache A spinal headache is a severe headache that can happen after getting a spinal tap, also called lumbar puncture, or an epidural anesthetic. Both of these procedures involve passing a needle through ligaments that run along the back side of your spinal column and into one of the spaces just above your spinal cord. Sometimes spinal fluid leaks through the temporary hole left by the needle. This leak causes a decrease in spinal fluid pressure, which leads to a spinal headache. The headache usually begins within hours or 1-2 days after the procedure, and it lasts until adequate pressure returns as your body creates more spinal fluid. The headache can last a few days and rarely lasts for more than 1 week. SIGNS AND SYMPTOMS   Severe headache pain when sitting or standing.  Decreased headache pain when lying down.  Neck pain, especially when flexing the neck in a chin-to-chest position.  Vomiting. DIAGNOSIS  Diagnosis of spinal headache is usually made based on your recent medical history. Your health care provider will consider the timing of a recent spinal tap or epidural anesthetic, along with how soon your headache occurred afterward. On rare occasions, tests may be done to confirm the diagnosis, such as an MRI. TREATMENT  Treatment may include:  Drinking extra fluids to improve your level of hydration. This will help your body replace the spinal fluid that has leaked out through the needle hole. Receiving IV fluids may be necessary.  Taking pain medicine as prescribed by your health care provider.  Drinking caffeinated beverages such as soda, coffee, or tea. Caffeine may help to shrink the blood vessels in your brain, which may reduce your headache pain.  Lying flat for a few days.  Having a blood patch procedure, which involves injecting a small amount of your blood at the  puncture site to seal the leak. HOME CARE INSTRUCTIONS  Lie down to relieve pain if your pain gets worse when you sit or stand.  Drink enough fluids to keep your urine clear or pale yellow.  Take pain medicine as directed by your health care provider. SEEK IMMEDIATE MEDICAL CARE IF:   Your pain becomes very severe or cannot be controlled.  You develop a fever.  You have a stiff neck.  You lose bowel or bladder control.  You have trouble walking. MAKE SURE YOU:  Understand these instructions.  Will watch your condition.   Will get help right away if you are not doing well or get worse. Document Released: 12/27/2001 Document Revised: 07/12/2013 Document Reviewed: 01/27/2013 [facility] Patient Information 2015 [place]. This information is not intended to replace advice given to you by your health care provider. Make sure you discuss any questions you have with your health care provider.

## 2014-06-10 NOTE — ED Notes (Signed)
Pt reports headache since this am. Pt reports had a "CT myologram and two blood patches completed yesterday." Pt reports has intercerebral hypotension and has chronic spinal headache. Pt denies any light and sound sensitivity. Pt reports chronic nausea as well. Speech clear. Pt alert and oriented. Airway patent. Pt denies any one-sided weakness.

## 2014-06-10 NOTE — ED Provider Notes (Signed)
CSN: [id number]     Arrival date & time 06/10/14  1717 History   First MD Initiated Contact with Patient 06/10/14 1743     Chief Complaint  Patient presents with  . Headache     (Consider location/radiation/quality/duration/timing/severity/associated sxs/prior Treatment) HPI Comments: Patient is a 53 year old female with history of cranial hypotension and spinal fluid leak. She is followed by neurology and was recently sent to [facility] for a CT myelogram and 2 blood patches. She was feeling better until this morning when she again became nauseated and developed a severe headache. She denies any fevers or chills. She denies any stiff neck. She called her intervention[facility] and was told to come here for pain medication and further evaluation.  Patient is a 53 y.o. female presenting with headaches. The history is provided by the patient.  Headache Pain location:  R parietal and L parietal Quality: Pressure. Duration:  12 hours Timing:  Constant Progression:  Worsening Chronicity:  New Similar to prior headaches: no   Context: bright light   Relieved by:  Nothing Worsened by:  Activity and light Ineffective treatments:  None tried Associated symptoms: nausea   Associated symptoms: no cough, no fever and no neck pain     Past Medical History  Diagnosis Date  . VITAMIN B12 DEFICIENCY 09/2009 dx  . Unspecified hypothyroidism 1982/2000    surgical resection of benign growth - partial then complete  . GERD (gastroesophageal reflux disease)     per endo. - does not take any meds.    Marland Kitchen. MIGRAINE HEADACHE     last Migraine 06/29/2011-   . ANEMIA, IRON DEFICIENCY   . Kidney stones 1998, 2005    x 2  . Depression     therapy  . Fibromyalgia 1999    no meds   Past Surgical History  Procedure Laterality Date  . Total thyroidectomy  2000    Had thyroid nodule, nonmalignant ([id number](1982-2000) resection  . Cholecystectomy  2009  . Tubal ligation  1989  . Diagnostic laparoscopy   1982    diagnostic  . Thyroid surgery  1983    partial   . Posterior fusion lumbar spine  06/2011   Family History  Problem Relation Age of Onset  . Hyperlipidemia Mother   . Heart disease Mother   . Hyperlipidemia Father   . Breast cancer Maternal Grandmother   . Hyperlipidemia Maternal Grandmother   . Ovarian cancer Other   . Anesthesia problems Neg Hx   . Malignant hyperthermia Neg Hx   . Pseudochol deficiency Neg Hx   . Hypotension Neg Hx    History  Substance Use Topics  . Smoking status: Never Smoker   . Smokeless tobacco: Not on file     Comment: Married, lives with spouse. 3 children. Employed at [employer], swing shift  . Alcohol Use: No   OB History    No data available     Review of Systems  Constitutional: Negative for fever.  Respiratory: Negative for cough.   Gastrointestinal: Positive for nausea.  Musculoskeletal: Negative for neck pain.  Neurological: Positive for headaches.  All other systems reviewed and are negative.     Allergies  Compazine; Metoclopramide hcl; Gluten meal; and Morphine and related  Home Medications   Prior to Admission medications   Medication Sig Start Date End Date Taking? Authorizing Provider  amoxicillin (AMOXIL) 500 MG capsule Take 1 capsule (500 mg total) by mouth 3 (three) times daily. Fill the  prescription for antibiotic it there is not dramatic improvement in the next  48-72 hours. 04/27/14   Pecola LawlessWilliam F Hopper, MD  cholecalciferol (VITAMIN D) 1000 UNITS tablet Take 1-2 tablets (1,000-2,000 Units total) by mouth daily. 12/27/13   Newt LukesValerie A Leschber, MD  cyanocobalamin (,VITAMIN B-12,) 1000 MCG/ML injection Inject 1 mL (1,000 mcg total) into the muscle every 30 (thirty) days. 12/28/13   Newt LukesValerie A Leschber, MD  levothyroxine (SYNTHROID, LEVOTHROID) 150 MCG tablet Take 1 tablet (150 mcg total) by mouth daily. On Tues, Wed, Thurs, Sat and Sun 12/28/13   Newt LukesValerie A Leschber, MD  levothyroxine (SYNTHROID, LEVOTHROID)  175 MCG tablet Take 1 tablet (175 mcg total) by mouth 2 (two) times a week. On Monday and Friday 12/28/13   Newt LukesValerie A Leschber, MD  Multiple Vitamin (MULTIVITAMIN) tablet Take 1 tablet by mouth daily.     Historical Provider, MD  nortriptyline (PAMELOR) 50 MG capsule Take 2 capsules (100 mg total) by mouth at bedtime. 04/26/14   Newt LukesValerie A Leschber, MD   BP 161/97 mmHg  Pulse 112  Temp(Src) 97.5 F (36.4 C) (Oral)  Resp 16  Ht 5\' 3"  (1.6 m)  Wt 132 lb (59.875 kg)  BMI 23.39 kg/m2  SpO2 100% Physical Exam  Constitutional: She is oriented to person, place, and time. She appears well-developed and well-nourished.  Patient is a 53 year old female who appears uncomfortable.  HENT:  Head: Normocephalic and atraumatic.  Eyes: EOM are normal. Pupils are equal, round, and reactive to light.  Neck: Normal range of motion. Neck supple.  Cardiovascular: Normal rate and regular rhythm.  Exam reveals no gallop and no friction rub.   No murmur heard. Pulmonary/Chest: Effort normal and breath sounds normal. No respiratory distress. She has no wheezes.  Abdominal: Soft. Bowel sounds are normal. She exhibits no distension. There is no tenderness.  Musculoskeletal: Normal range of motion. She exhibits no edema.  Neurological: She is alert and oriented to person, place, and time. No cranial nerve deficit. She exhibits normal muscle tone. Coordination normal.  Skin: Skin is warm and dry.  The 2 areas on her thoracic and lumbar region where the procedures were performed appear clean and without erythema or drainage.  Nursing note and vitals reviewed.   ED Course  Procedures (including critical care time) Labs Review Labs Reviewed - No data to display  Imaging Review No results found.   EKG Interpretation None      MDM   Final diagnoses:  None    Patient feels significantly improved with IV fluids and medications given in the ER. Her neuro exam is nonfocal and I believe she is appropriate for  discharge. She is to follow-up with her interventional neurologist if not improving in the next 2 days.    Geoffery Lyonsouglas Waylen Depaolo, MD 06/10/14 [phone]

## 2014-06-20 ENCOUNTER — Ambulatory Visit: Payer: Self-pay | Admitting: Internal Medicine

## 2014-08-12 ENCOUNTER — Other Ambulatory Visit: Payer: Self-pay | Admitting: Internal Medicine

## 2014-08-29 ENCOUNTER — Ambulatory Visit (INDEPENDENT_AMBULATORY_CARE_PROVIDER_SITE_OTHER): Payer: 59 | Admitting: Internal Medicine

## 2014-08-29 ENCOUNTER — Encounter: Payer: Self-pay | Admitting: Internal Medicine

## 2014-08-29 VITALS — BP 132/82 | HR 94 | Temp 97.8°F | Ht 63.0 in | Wt 139.5 lb

## 2014-08-29 DIAGNOSIS — E538 Deficiency of other specified B group vitamins: Secondary | ICD-10-CM

## 2014-08-29 DIAGNOSIS — E039 Hypothyroidism, unspecified: Secondary | ICD-10-CM

## 2014-08-29 DIAGNOSIS — Z0000 Encounter for general adult medical examination without abnormal findings: Secondary | ICD-10-CM

## 2014-08-29 DIAGNOSIS — Z23 Encounter for immunization: Secondary | ICD-10-CM

## 2014-08-29 MED ORDER — LEVOTHYROXINE SODIUM 175 MCG PO TABS: 175.0000 ug | ORAL_TABLET | ORAL | Status: DC

## 2014-08-29 MED ORDER — B-12 1000 MCG SL SUBL: 1000.0000 ug | SUBLINGUAL_TABLET | Freq: Every day | SUBLINGUAL | Status: DC

## 2014-08-29 NOTE — Progress Notes (Signed)
Pre visit review using our clinic review tool, if applicable. No additional management support is needed unless otherwise documented below in the visit note. 

## 2014-08-29 NOTE — Assessment & Plan Note (Signed)
S/p total thyroidectomy 2000 Last labs and dose changes reviewed Recheck now and adjust as needed The current medical regimen is effective;  continue present plan and medications.   Lab Results  Component Value Date   TSH 0.09* 12/27/2013

## 2014-08-29 NOTE — Patient Instructions (Addendum)
It was good to see you today.  We have reviewed your prior records including labs and tests today  Health Maintenance reviewed - Tdap given today (good for 10 years) - will do "egg free flu shot" in Fall 2016 - all otherl recommended immunizations and age-appropriate screenings are up-to-date.  Test(s) ordered today. Return when you are fasting. Your results will be released to [place] (or called to you) after review, usually within 72hours after test completion. If any changes need to be made, you will be notified at that same time.  Medications reviewed and updated, no changes recommended at this time. Your prescription(s) have been submitted to your pharmacy. Please take as directed and contact our office if you believe you are having problem(s) with the medication(s).  Please schedule followup in 12 months for annual exam and labs, call sooner if problems.  Health Maintenance Adopting a healthy lifestyle and getting preventive care can go a long way to promote health and wellness. Talk with your health care provider about what schedule of regular examinations is right for you. This is a good chance for you to check in with your provider about disease prevention and staying healthy. In between checkups, there are plenty of things you can do on your own. Experts have done a lot of research about which lifestyle changes and preventive measures are most likely to keep you healthy. Ask your health care provider for more information. WEIGHT AND DIET  Eat a healthy diet  Be sure to include plenty of vegetables, fruits, low-fat dairy products, and lean protein.  Do not eat a lot of foods high in solid fats, added sugars, or salt.  Get regular exercise. This is one of the most important things you can do for your health.  Most adults should exercise for at least 150 minutes each week. The exercise should increase your heart rate and make you sweat (moderate-intensity exercise).  Most adults  should also do strengthening exercises at least twice a week. This is in addition to the moderate-intensity exercise.  Maintain a healthy weight  Body mass index (BMI) is a measurement that can be used to identify possible weight problems. It estimates body fat based on height and weight. Your health care provider can help determine your BMI and help you achieve or maintain a healthy weight.  For females 60 years of age and older:   A BMI below 18.5 is considered underweight.  A BMI of 18.5 to 24.9 is normal.  A BMI of 25 to 29.9 is considered overweight.  A BMI of 30 and above is considered obese.  Watch levels of cholesterol and blood lipids  You should start having your blood tested for lipids and cholesterol at 53 years of age, then have this test every 5 years.  You may need to have your cholesterol levels checked more often if:  Your lipid or cholesterol levels are high.  You are older than 53 years of age.  You are at high risk for heart disease.  CANCER SCREENING   Lung Cancer  Lung cancer screening is recommended for adults 74-22 years old who are at high risk for lung cancer because of a history of smoking.  A yearly low-dose CT scan of the lungs is recommended for people who:  Currently smoke.  Have quit within the past 15 years.  Have at least a 30-pack-year history of smoking. A pack year is smoking an average of one pack of cigarettes a day for 1 year.  Yearly screening should continue until it has been 15 years since you quit.  Yearly screening should stop if you develop a health problem that would prevent you from having lung cancer treatment.  Breast Cancer  Practice breast self-awareness. This means understanding how your breasts normally appear and feel.  It also means doing regular breast self-exams. Let your health care provider know about any changes, no matter how small.  If you are in your 20s or 30s, you should have a clinical breast exam  (CBE) by a health care provider every 1-3 years as part of a regular health exam.  If you are 57 or older, have a CBE every year. Also consider having a breast X-ray (mammogram) every year.  If you have a family history of breast cancer, talk to your health care provider about genetic screening.  If you are at high risk for breast cancer, talk to your health care provider about having an MRI and a mammogram every year.  Breast cancer gene (BRCA) assessment is recommended for women who have family members with BRCA-related cancers. BRCA-related cancers include:  Breast.  Ovarian.  Tubal.  Peritoneal cancers.  Results of the assessment will determine the need for genetic counseling and BRCA1 and BRCA2 testing. Cervical Cancer Routine pelvic examinations to screen for cervical cancer are no longer recommended for nonpregnant women who are considered low risk for cancer of the pelvic organs (ovaries, uterus, and vagina) and who do not have symptoms. A pelvic examination may be necessary if you have symptoms including those associated with pelvic infections. Ask your health care provider if a screening pelvic exam is right for you.   The Pap test is the screening test for cervical cancer for women who are considered at risk.  If you had a hysterectomy for a problem that was not cancer or a condition that could lead to cancer, then you no longer need Pap tests.  If you are older than 65 years, and you have had normal Pap tests for the past 10 years, you no longer need to have Pap tests.  If you have had past treatment for cervical cancer or a condition that could lead to cancer, you need Pap tests and screening for cancer for at least 20 years after your treatment.  If you no longer get a Pap test, assess your risk factors if they change (such as having a new sexual partner). This can affect whether you should start being screened again.  Some women have medical problems that increase their  chance of getting cervical cancer. If this is the case for you, your health care provider may recommend more frequent screening and Pap tests.  The human papillomavirus (HPV) test is another test that may be used for cervical cancer screening. The HPV test looks for the virus that can cause cell changes in the cervix. The cells collected during the Pap test can be tested for HPV.  The HPV test can be used to screen women 6 years of age and older. Getting tested for HPV can extend the interval between normal Pap tests from three to five years.  An HPV test also should be used to screen women of any age who have unclear Pap test results.  After 53 years of age, women should have HPV testing as often as Pap tests.  Colorectal Cancer  This type of cancer can be detected and often prevented.  Routine colorectal cancer screening usually begins at 53 years of age and continues  through 53 years of age.  Your health care provider may recommend screening at an earlier age if you have risk factors for colon cancer.  Your health care provider may also recommend using home test kits to check for hidden blood in the stool.  A small camera at the end of a tube can be used to examine your colon directly (sigmoidoscopy or colonoscopy). This is done to check for the earliest forms of colorectal cancer.  Routine screening usually begins at age 20.  Direct examination of the colon should be repeated every 5-10 years through 53 years of age. However, you may need to be screened more often if early forms of precancerous polyps or small growths are found. Skin Cancer  Check your skin from head to toe regularly.  Tell your health care provider about any new moles or changes in moles, especially if there is a change in a mole's shape or color.  Also tell your health care provider if you have a mole that is larger than the size of a pencil eraser.  Always use sunscreen. Apply sunscreen liberally and  repeatedly throughout the day.  Protect yourself by wearing long sleeves, pants, a wide-brimmed hat, and sunglasses whenever you are outside. HEART DISEASE, DIABETES, AND HIGH BLOOD PRESSURE   Have your blood pressure checked at least every 1-2 years. High blood pressure causes heart disease and increases the risk of stroke.  If you are between 57 years and 50 years old, ask your health care provider if you should take aspirin to prevent strokes.  Have regular diabetes screenings. This involves taking a blood sample to check your fasting blood sugar level.  If you are at a normal weight and have a low risk for diabetes, have this test once every three years after 53 years of age.  If you are overweight and have a high risk for diabetes, consider being tested at a younger age or more often. PREVENTING INFECTION  Hepatitis B  If you have a higher risk for hepatitis B, you should be screened for this virus. You are considered at high risk for hepatitis B if:  You were born in a country where hepatitis B is common. Ask your health care provider which countries are considered high risk.  Your parents were born in a high-risk country, and you have not been immunized against hepatitis B (hepatitis B vaccine).  You have HIV or AIDS.  You use needles to inject street drugs.  You live with someone who has hepatitis B.  You have had sex with someone who has hepatitis B.  You get hemodialysis treatment.  You take certain medicines for conditions, including cancer, organ transplantation, and autoimmune conditions. Hepatitis C  Blood testing is recommended for:  Everyone born from [birth year] through 1964.  Anyone with known risk factors for hepatitis C. Sexually transmitted infections (STIs)  You should be screened for sexually transmitted infections (STIs) including gonorrhea and chlamydia if:  You are sexually active and are younger than 53 years of age.  You are older than 53 years of  age and your health care provider tells you that you are at risk for this type of infection.  Your sexual activity has changed since you were last screened and you are at an increased risk for chlamydia or gonorrhea. Ask your health care provider if you are at risk.  If you do not have HIV, but are at risk, it may be recommended that you take a prescription medicine daily  to prevent HIV infection. This is called pre-exposure prophylaxis (PrEP). You are considered at risk if:  You are sexually active and do not regularly use condoms or know the HIV status of your partner(s).  You take drugs by injection.  You are sexually active with a partner who has HIV. Talk with your health care provider about whether you are at high risk of being infected with HIV. If you choose to begin PrEP, you should first be tested for HIV. You should then be tested every 3 months for as long as you are taking PrEP.  PREGNANCY   If you are premenopausal and you may become pregnant, ask your health care provider about preconception counseling.  If you may become pregnant, take 400 to 800 micrograms (mcg) of folic acid every day.  If you want to prevent pregnancy, talk to your health care provider about birth control (contraception). OSTEOPOROSIS AND MENOPAUSE   Osteoporosis is a disease in which the bones lose minerals and strength with aging. This can result in serious bone fractures. Your risk for osteoporosis can be identified using a bone density scan.  If you are 45 years of age or older, or if you are at risk for osteoporosis and fractures, ask your health care provider if you should be screened.  Ask your health care provider whether you should take a calcium or vitamin D supplement to lower your risk for osteoporosis.  Menopause may have certain physical symptoms and risks.  Hormone replacement therapy may reduce some of these symptoms and risks. Talk to your health care provider about whether hormone  replacement therapy is right for you.  HOME CARE INSTRUCTIONS   Schedule regular health, dental, and eye exams.  Stay current with your immunizations.   Do not use any tobacco products including cigarettes, chewing tobacco, or electronic cigarettes.  If you are pregnant, do not drink alcohol.  If you are breastfeeding, limit how much and how often you drink alcohol.  Limit alcohol intake to no more than 1 drink per day for nonpregnant women. One drink equals 12 ounces of beer, 5 ounces of wine, or 1 ounces of hard liquor.  Do not use street drugs.  Do not share needles.  Ask your health care provider for help if you need support or information about quitting drugs.  Tell your health care provider if you often feel depressed.  Tell your health care provider if you have ever been abused or do not feel safe at home. Document Released: 01/20/2011 Document Revised: 11/21/2013 Document Reviewed: 06/08/2013 [facility] Patient Information 2015 [place], [place]. This information is not intended to replace advice given to you by your health care provider. Make sure you discuss any questions you have with your health care provider.

## 2014-08-29 NOTE — Progress Notes (Signed)
Subjective:    Patient ID: Melissa Kirby, female    DOB: Apr 30, 1961, 54 y.o.   MRN: [id number]  HPI  patient is here today for annual physical. Patient feels well and has no complaints.   Past Medical History  Diagnosis Date  . VITAMIN B12 DEFICIENCY 09/2009 dx  . Unspecified hypothyroidism 1982/2000    surgical resection of benign growth - partial then complete  . GERD (gastroesophageal reflux disease)     per endo. - does not take any meds.    Marland Kitchen MIGRAINE HEADACHE     last Migraine 06/29/2011-   . ANEMIA, IRON DEFICIENCY   . Kidney stones 1998, 2005    x 2  . Depression     therapy  . Fibromyalgia 1999    no meds  . Cerebrospinal fluid leak from spinal puncture     improved with 7th patch at [facility] 05/2014   Family History  Problem Relation Age of Onset  . Hyperlipidemia Mother   . Heart disease Mother 57    massive MI  . Hyperlipidemia Father   . Breast cancer Maternal Grandmother   . Hyperlipidemia Maternal Grandmother   . Ovarian cancer Other   . Anesthesia problems Neg Hx   . Malignant hyperthermia Neg Hx   . Pseudochol deficiency Neg Hx   . Hypotension Neg Hx   . COPD Father     severe, smoker  . Coronary artery disease Maternal Grandmother 78    CABG   History  Substance Use Topics  . Smoking status: Never Smoker   . Smokeless tobacco: Not on file  . Alcohol Use: No    Review of Systems  Constitutional: Negative for fatigue and unexpected weight change.  Respiratory: Negative for cough, shortness of breath and wheezing.   Cardiovascular: Negative for chest pain, palpitations and leg swelling.  Gastrointestinal: Negative for nausea, abdominal pain and diarrhea.  Neurological: Negative for dizziness, weakness, light-headedness and headaches.  Psychiatric/Behavioral: Negative for dysphoric mood. The patient is not nervous/anxious.   All other systems reviewed and are negative.      Objective:    Physical Exam  Constitutional: She is oriented to  person, place, and time. She appears well-developed and well-nourished. No distress.  HENT:  Head: Normocephalic and atraumatic.  Right Ear: External ear normal.  Left Ear: External ear normal.  Nose: Nose normal.  Mouth/Throat: Oropharynx is clear and moist. No oropharyngeal exudate.  Eyes: EOM are normal. Pupils are equal, round, and reactive to light. Right eye exhibits no discharge. Left eye exhibits no discharge. No scleral icterus.  Neck: Normal range of motion. Neck supple. No JVD present. No tracheal deviation present. No thyromegaly present.  Cardiovascular: Normal rate, regular rhythm, normal heart sounds and intact distal pulses.  Exam reveals no friction rub.   No murmur heard. Pulmonary/Chest: Effort normal and breath sounds normal. No respiratory distress. She has no wheezes. She has no rales. She exhibits no tenderness.  Abdominal: Soft. Bowel sounds are normal. She exhibits no distension and no mass. There is no tenderness. There is no rebound and no guarding.  Genitourinary:  Defer to gyn  Musculoskeletal: Normal range of motion.  No gross deformities  Lymphadenopathy:    She has no cervical adenopathy.  Neurological: She is alert and oriented to person, place, and time. She has normal reflexes. No cranial nerve deficit.  Skin: Skin is warm and dry. No rash noted. She is not diaphoretic. No erythema.  Psychiatric: She has a normal  mood and affect. Her behavior is normal. Judgment and thought content normal.  Nursing note and vitals reviewed.   BP 132/82 mmHg  Pulse 94  Temp(Src) 97.8 F (36.6 C) (Oral)  Ht 5\' 3"  (1.6 m)  Wt 139 lb 8 oz (63.277 kg)  BMI 24.72 kg/m2  SpO2 98% Wt Readings from Last 3 Encounters:  08/29/14 139 lb 8 oz (63.277 kg)  06/10/14 132 lb (59.875 kg)  05/03/14 135 lb 7 oz (61.434 kg)     Lab Results  Component Value Date   WBC 5.9 10/13/2013   HGB 12.3 10/13/2013   HCT 36.7 10/13/2013   PLT 254.0 10/13/2013   GLUCOSE 93 10/13/2013    CHOL 214* 06/29/2013   TRIG 118.0 06/29/2013   HDL 55.80 06/29/2013   LDLDIRECT 145.1 06/29/2013   LDLCALC 93 10/03/2009   ALT 18 10/13/2013   AST 24 10/13/2013   NA 136 10/13/2013   K 4.4 10/13/2013   CL 104 10/13/2013   CREATININE 0.8 10/13/2013   BUN 8 10/13/2013   CO2 25 10/13/2013   TSH 0.09* 12/27/2013   INR 1.02 07/04/2011    No results found.     Assessment & Plan:   CPX/z00.00 - Patient has been counseled on age-appropriate routine health concerns for screening and prevention. These are reviewed and up-to-date. Immunizations are up-to-date or declined. Labs ordered and reviewed. Tdap today, flu in fall 2016 - reviewed prior "hot rash" at injection site followin flu immunization 10y ago - will do egg free in fall  Problem List Items Addressed This Visit    Hypothyroidism    S/p total thyroidectomy 2000 Last labs and dose changes reviewed Recheck now and adjust as needed The current medical regimen is effective;  continue present plan and medications.   Lab Results  Component Value Date   TSH 0.09* 12/27/2013        Relevant Medications   levothyroxine (SYNTHROID, LEVOTHROID) tablet   Vitamin B12 deficiency (non anemic)    Re-started B12 sl in 04/2012 -  recheck now and consider resuming IM if needed Lab Results  Component Value Date   VITAMINB12 307 12/27/2013         Other Visit Diagnoses    Routine general medical examination at a health care facility    -  Primary    Relevant Orders    Basic metabolic panel    CBC with Differential/Platelet    Hepatic function panel    Lipid panel    TSH    Urinalysis, Routine w reflex microscopic        Rene PaciValerie Antwyne Pingree, MD

## 2014-08-29 NOTE — Assessment & Plan Note (Signed)
Re-started B12 sl in 04/2012 -  recheck now and consider resuming IM if needed Lab Results  Component Value Date   VITAMINB12 307 12/27/2013

## 2014-09-01 ENCOUNTER — Other Ambulatory Visit (INDEPENDENT_AMBULATORY_CARE_PROVIDER_SITE_OTHER): Payer: 59

## 2014-09-01 DIAGNOSIS — Z0000 Encounter for general adult medical examination without abnormal findings: Secondary | ICD-10-CM

## 2014-09-01 LAB — CBC WITH DIFFERENTIAL/PLATELET
Basophils Absolute: 0 10*3/uL (ref 0.0–0.1)
Basophils Relative: 0.6 % (ref 0.0–3.0)
Eosinophils Absolute: 0.1 10*3/uL (ref 0.0–0.7)
Eosinophils Relative: 2.8 % (ref 0.0–5.0)
HCT: 35.7 % — ABNORMAL LOW (ref 36.0–46.0)
Hemoglobin: 12.1 g/dL (ref 12.0–15.0)
Lymphocytes Relative: 41.5 % (ref 12.0–46.0)
Lymphs Abs: 2 10*3/uL (ref 0.7–4.0)
MCHC: 33.9 g/dL (ref 30.0–36.0)
MCV: 87.6 fl (ref 78.0–100.0)
Monocytes Absolute: 0.6 10*3/uL (ref 0.1–1.0)
Monocytes Relative: 12 % (ref 3.0–12.0)
Neutro Abs: 2.1 10*3/uL (ref 1.4–7.7)
Neutrophils Relative %: 43.1 % (ref 43.0–77.0)
Platelets: 251 10*3/uL (ref 150.0–400.0)
RBC: 4.07 Mil/uL (ref 3.87–5.11)
RDW: 13.3 % (ref 11.5–15.5)
WBC: 4.8 10*3/uL (ref 4.0–10.5)

## 2014-09-01 LAB — HEPATIC FUNCTION PANEL
ALT: 14 U/L (ref 0–35)
AST: 18 U/L (ref 0–37)
Albumin: 4 g/dL (ref 3.5–5.2)
Alkaline Phosphatase: 157 U/L — ABNORMAL HIGH (ref 39–117)
Bilirubin, Direct: 0.1 mg/dL (ref 0.0–0.3)
Total Bilirubin: 0.5 mg/dL (ref 0.2–1.2)
Total Protein: 7.8 g/dL (ref 6.0–8.3)

## 2014-09-01 LAB — URINALYSIS, ROUTINE W REFLEX MICROSCOPIC
Bilirubin Urine: NEGATIVE
Hgb urine dipstick: NEGATIVE
Ketones, ur: NEGATIVE
Nitrite: NEGATIVE
Specific Gravity, Urine: 1.01 (ref 1.000–1.030)
Total Protein, Urine: NEGATIVE
Urine Glucose: NEGATIVE
Urobilinogen, UA: 0.2 (ref 0.0–1.0)
pH: 7 (ref 5.0–8.0)

## 2014-09-01 LAB — BASIC METABOLIC PANEL
BUN: 7 mg/dL (ref 6–23)
CO2: 30 mEq/L (ref 19–32)
Calcium: 9.7 mg/dL (ref 8.4–10.5)
Chloride: 104 mEq/L (ref 96–112)
Creatinine, Ser: 0.89 mg/dL (ref 0.40–1.20)
GFR: 70.68 mL/min (ref >60.00)
Glucose, Bld: 82 mg/dL (ref 70–99)
Potassium: 4.2 mEq/L (ref 3.5–5.1)
Sodium: 139 mEq/L (ref 135–145)

## 2014-09-01 LAB — LIPID PANEL
Cholesterol: 182 mg/dL (ref 0–200)
HDL: 51.3 mg/dL (ref >39.00)
LDL Cholesterol: 112 mg/dL — ABNORMAL HIGH (ref 0–99)
NonHDL: 130.7
Total CHOL/HDL Ratio: 4
Triglycerides: 96 mg/dL (ref 0.0–149.0)
VLDL: 19.2 mg/dL (ref 0.0–40.0)

## 2014-09-01 LAB — TSH: TSH: 1.78 u[IU]/mL (ref 0.35–4.50)

## 2014-09-13 ENCOUNTER — Other Ambulatory Visit: Payer: Self-pay | Admitting: Internal Medicine

## 2014-11-12 ENCOUNTER — Other Ambulatory Visit: Payer: Self-pay | Admitting: Internal Medicine

## 2014-11-28 ENCOUNTER — Other Ambulatory Visit: Payer: Self-pay

## 2014-11-28 MED ORDER — NORTRIPTYLINE HCL 50 MG PO CAPS: 100.0000 mg | ORAL_CAPSULE | Freq: Every day | ORAL | Status: DC

## 2015-02-05 ENCOUNTER — Telehealth: Payer: Self-pay | Admitting: Geriatric Medicine

## 2015-02-05 NOTE — Telephone Encounter (Signed)
Patient had a mammogram last year. She had it done at [facility]. She said she gets hers done every two years, so it is not due yet.

## 2015-03-13 ENCOUNTER — Encounter: Payer: Self-pay | Admitting: Internal Medicine

## 2015-03-13 ENCOUNTER — Ambulatory Visit (INDEPENDENT_AMBULATORY_CARE_PROVIDER_SITE_OTHER): Payer: 59 | Admitting: Internal Medicine

## 2015-03-13 ENCOUNTER — Other Ambulatory Visit (INDEPENDENT_AMBULATORY_CARE_PROVIDER_SITE_OTHER): Payer: 59

## 2015-03-13 VITALS — BP 104/80 | HR 120 | Temp 98.4°F | Ht 63.0 in | Wt 137.5 lb

## 2015-03-13 DIAGNOSIS — E039 Hypothyroidism, unspecified: Secondary | ICD-10-CM | POA: Diagnosis not present

## 2015-03-13 DIAGNOSIS — F329 Major depressive disorder, single episode, unspecified: Secondary | ICD-10-CM

## 2015-03-13 DIAGNOSIS — F32A Depression, unspecified: Secondary | ICD-10-CM

## 2015-03-13 LAB — TSH: TSH: 0.18 u[IU]/mL — ABNORMAL LOW (ref 0.35–4.50)

## 2015-03-13 MED ORDER — FLUOXETINE HCL 10 MG PO CAPS: 10.0000 mg | ORAL_CAPSULE | Freq: Every day | ORAL | Status: DC

## 2015-03-13 NOTE — Progress Notes (Signed)
Subjective:    Patient ID: Melissa Kirby, female    DOB: 07-08-1961, 54 y.o.   MRN: [id number]  HPI  Patient here for followup - divorce nearly final - pt unhappy and depressed about same as soon-to-be ex has "moved on"  Past Medical History  Diagnosis Date  . VITAMIN B12 DEFICIENCY 09/2009 dx  . Unspecified hypothyroidism 1982/2000    surgical resection of benign growth - partial then complete  . GERD (gastroesophageal reflux disease)     per endo. - does not take any meds.    Marland Kitchen MIGRAINE HEADACHE     last Migraine 06/29/2011-   . ANEMIA, IRON DEFICIENCY   . Kidney stones 1998, 2005    x 2  . Depression     therapy  . Fibromyalgia 1999    no meds  . Cerebrospinal fluid leak from spinal puncture     improved with 7th patch at [facility] 05/2014    Review of Systems  Constitutional: Positive for fatigue. Negative for unexpected weight change.  Respiratory: Negative for cough and shortness of breath.   Cardiovascular: Negative for chest pain and leg swelling.  Psychiatric/Behavioral: Positive for sleep disturbance, dysphoric mood and decreased concentration. Negative for suicidal ideas and self-injury. The patient is nervous/anxious.        Objective:    Physical Exam  Constitutional: She appears well-developed and well-nourished. No distress.  Cardiovascular: Normal rate, regular rhythm and normal heart sounds.   No murmur heard. Pulmonary/Chest: Effort normal and breath sounds normal. No respiratory distress.  Musculoskeletal: She exhibits no edema.  Psychiatric:  Tearful, depressed    BP 104/80 mmHg  Pulse 120  Temp(Src) 98.4 F (36.9 C) (Oral)  Ht 5\' 3"  (1.6 m)  Wt 137 lb 8 oz (62.37 kg)  BMI 24.36 kg/m2  SpO2 98% Wt Readings from Last 3 Encounters:  03/13/15 137 lb 8 oz (62.37 kg)  08/29/14 139 lb 8 oz (63.277 kg)  06/10/14 132 lb (59.875 kg)    Lab Results  Component Value Date   WBC 4.8 09/01/2014   HGB 12.1 09/01/2014   HCT 35.7* 09/01/2014   PLT 251.0  09/01/2014   GLUCOSE 82 09/01/2014   CHOL 182 09/01/2014   TRIG 96.0 09/01/2014   HDL 51.30 09/01/2014   LDLDIRECT 145.1 06/29/2013   LDLCALC 112* 09/01/2014   ALT 14 09/01/2014   AST 18 09/01/2014   NA 139 09/01/2014   K 4.2 09/01/2014   CL 104 09/01/2014   CREATININE 0.89 09/01/2014   BUN 7 09/01/2014   CO2 30 09/01/2014   TSH 1.78 09/01/2014   INR 1.02 07/04/2011    No results found.     Assessment & Plan:   Problem List Items Addressed This Visit    Depression - Primary    chronic symptoms, in the exacerbated by emotional stressors Previously treated with SNRI venlafaxine but discontinued because of potential increase in headache symptoms exacerbation of depression with tearfulness at this time related to ongoing divorce process nearing finalization Plans to resume counseling with Judithe Modest in the works [employer] resume generic antidepression at this time, selecting SSRI Prozac -begin 10 mg daily for one or 2 weeks, then increase to 20 mg daily we reviewed potential risk/benefit and possible side effects - pt understands and agrees to same  Patient verifies no suicidal or homicidal ideation ollow-up in 6 weeks to review, patient agrees to call sooner if worse Emotional support today      Relevant Medications  FLUoxetine (PROZAC) 10 MG capsule   Other Relevant Orders   Ambulatory referral to Psychology   Hypothyroidism    S/p total thyroidectomy 2000 Last labs and dose changes reviewed Recheck now and adjust as needed The current medical regimen is effective;  continue present plan and medications.   Lab Results  Component Value Date   TSH 1.78 09/01/2014        Relevant Orders   TSH       Rene Paci, MD

## 2015-03-13 NOTE — Assessment & Plan Note (Signed)
chronic symptoms, in the exacerbated by emotional stressors Previously treated with SNRI venlafaxine but discontinued because of potential increase in headache symptoms exacerbation of depression with tearfulness at this time related to ongoing divorce process nearing finalization Plans to resume counseling with Judithe Modest in the works [employer] resume generic antidepression at this time, selecting SSRI Prozac -begin 10 mg daily for one or 2 weeks, then increase to 20 mg daily we reviewed potential risk/benefit and possible side effects - pt understands and agrees to same  Patient verifies no suicidal or homicidal ideation ollow-up in 6 weeks to review, patient agrees to call sooner if worse Emotional support today

## 2015-03-13 NOTE — Assessment & Plan Note (Signed)
S/p total thyroidectomy 2000 Last labs and dose changes reviewed Recheck now and adjust as needed The current medical regimen is effective;  continue present plan and medications.   Lab Results  Component Value Date   TSH 1.78 09/01/2014

## 2015-03-13 NOTE — Progress Notes (Signed)
Pre visit review using our clinic review tool, if applicable. No additional management support is needed unless otherwise documented below in the visit note. 

## 2015-03-13 NOTE — Patient Instructions (Signed)
It was good to see you today.  We have reviewed your prior records including labs and tests today  Test(s) ordered today. Your results will be released to MyChart (or called to you) after review, usually within 72hours after test completion. If any changes need to be made, you will be notified at that same time.  Medications reviewed and updated start low-dose generic Prozac and increase after one week to 20 mg daily as discussed for depression symptoms No other changes recommended at this time.  we'll make referral to Judithe Modest for counseling as discussed. Our office will contact you regarding appointment(s) once made.  Please schedule followup in 6 weeks, call sooner if problems.

## 2015-04-11 ENCOUNTER — Other Ambulatory Visit: Payer: Self-pay | Admitting: Internal Medicine

## 2015-05-27 ENCOUNTER — Other Ambulatory Visit: Payer: Self-pay | Admitting: Internal Medicine

## 2015-06-27 ENCOUNTER — Encounter: Payer: Self-pay | Admitting: Gastroenterology

## 2015-07-02 ENCOUNTER — Other Ambulatory Visit: Payer: Self-pay | Admitting: Internal Medicine

## 2015-08-03 ENCOUNTER — Telehealth: Payer: Self-pay

## 2015-08-03 MED ORDER — FLUOXETINE HCL 10 MG PO CAPS: 10.0000 mg | ORAL_CAPSULE | Freq: Two times a day (BID) | ORAL | Status: DC

## 2015-08-03 NOTE — Telephone Encounter (Signed)
Recd refill request for fluoxetine, patient has scheduled office visit with new pcp--dr burns for feb 20th--small amt of rx sent to patient pharm

## 2015-08-20 ENCOUNTER — Encounter: Payer: Self-pay | Admitting: Neurology

## 2015-08-20 ENCOUNTER — Ambulatory Visit (INDEPENDENT_AMBULATORY_CARE_PROVIDER_SITE_OTHER): Payer: 59 | Admitting: Neurology

## 2015-08-20 VITALS — BP 112/70 | HR 122 | Ht 63.0 in | Wt 136.0 lb

## 2015-08-20 DIAGNOSIS — R296 Repeated falls: Secondary | ICD-10-CM | POA: Diagnosis not present

## 2015-08-20 DIAGNOSIS — R55 Syncope and collapse: Secondary | ICD-10-CM | POA: Diagnosis not present

## 2015-08-20 DIAGNOSIS — G9389 Other specified disorders of brain: Secondary | ICD-10-CM | POA: Diagnosis not present

## 2015-08-20 DIAGNOSIS — M5417 Radiculopathy, lumbosacral region: Secondary | ICD-10-CM | POA: Diagnosis not present

## 2015-08-20 DIAGNOSIS — G96810 Intracranial hypotension, unspecified: Secondary | ICD-10-CM

## 2015-08-20 NOTE — Patient Instructions (Signed)
1.  We will get MRI of brain with and without contrast 2.  We will schedule EEG 3.  As per [place] law, no driving for six months after episode of unexplained loss of consciousness 4.  Leg pain as per pain management/orthopedist 5.  Follow up after testing.  If unrevealing, would probably want you to see [facility] again.

## 2015-08-20 NOTE — Progress Notes (Signed)
Chart forwarded.  

## 2015-08-20 NOTE — Progress Notes (Signed)
NEUROLOGY FOLLOW UP OFFICE NOTE  Melissa Kirby [id number]  HISTORY OF PRESENT ILLNESS: Melissa Kirby is a 55 year old right-handed woman with history of migraine, B12 deficiency, hypothyroidism, kidney stones, depression, and history of idiopathic intracranial hypotension and lumbar laminectomy and fusion who now presents for falls.  History obtained by patient and orthopedist's note.  Labs reviewed.  UPDATE: For the past 2 months, she has had 3 episodes of falls.  It occurs spontaneously.  She is not sure what happens.  She does not know if she passes out but she cannot recall actually falling.  She is standing or walking and the next thing she knows, she is on the ground.  It lasts no more than a minute.  She denies dizziness, visual disturbance, or focal weakness.  One of these falls, she fell and hit her left hip.  She then developed pain radiating down the left leg.  She has history of laminectomy and fusion at L4-5.  She followed up with orthopedics, who gave her a shot, which helped.  Last fall was around 07/30/15.  She also reports tingling of her scalp and pressure in her ears, similar to prior hypotensive headache, but no accompanying headache.  It is relieved when laying supine.  She has been dealing with depression.  She separated from her husband a few months ago, and she now lives by herself. TSH from August was 0.18.  HISTORY: In January-February 2015, she developed a stabbing headache several times daily.  Valsalva, bending forward, coughing and sneezing would make it worse.  Laying supine helped.    MRI of the brain was performed on 10/13/13, which revealed inferior displacement of the cerebellum and brainstem, with flattening of the ventral pons and diminished suprasellar cistern volume, as well as mild circumferential dural thickening, findings which may be seen in intracranial hypotension.  She underwent lumbar puncture with opening pressure of 11 and closing pressure less than 9.   CSF cell count 6, glucose 58, protein 35, gram stain and culture negative.  MRI of the cervical, thoracic and lumbar spines with and without contrast on 11/23/13 did not reveal any post-contrast signal abnormality or location for CSF leak.  CT myelogram revealed possible epidural leak on the right at T7-T8.  She underwent a blood patch on 01/13/14, and she was headache-free for almost 3 months but then the pain recurred.  She was referred to Dr. Sonia Baller, neuroradiologist at [facility] for evaluation and possible intervention.  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   She underwent gluing.  PAST MEDICAL HISTORY: Past Medical History  Diagnosis Date  . VITAMIN B12 DEFICIENCY 09/2009 dx  . Unspecified hypothyroidism 1982/2000    surgical resection of benign growth - partial then complete  . GERD (gastroesophageal reflux disease)     per endo. - does not take any meds.    Marland Kitchen MIGRAINE HEADACHE     last Migraine 06/29/2011-   . ANEMIA, IRON DEFICIENCY   . Kidney stones 1998, 2005    x 2  . Depression     therapy  . Fibromyalgia 1999    no meds  . Cerebrospinal fluid leak from spinal puncture     improved with 7th patch at [facility] 05/2014    MEDICATIONS: Current Outpatient Prescriptions on File Prior to Visit  Medication Sig Dispense Refill  . cholecalciferol (VITAMIN D) 1000  UNITS tablet Take 1-2 tablets (1,000-2,000 Units total) by mouth daily.    . Cyanocobalamin (B-12) 1000 MCG SUBL Place 1,000 mcg under the tongue daily. 30 each   . FLUoxetine (PROZAC) 10 MG capsule Take 1 capsule (10 mg total) by mouth 2 (two) times daily. 90 capsule 0  . levothyroxine (SYNTHROID, LEVOTHROID) 150 MCG tablet Take 1 tablet (150 mcg total) by mouth every Tuesday, Wednesday, Thursday, Saturday, and Sunday at 6 PM. 90 tablet 3  . levothyroxine (SYNTHROID, LEVOTHROID) 175 MCG tablet  Take 1 tablet (175 mcg total) by mouth 2 (two) times a week. On Monday and Friday 30 tablet 3  . Multiple Vitamin (MULTIVITAMIN) tablet Take 1 tablet by mouth daily.     . nortriptyline (PAMELOR) 50 MG capsule TAKE 2 CAPSULES (100 MG TOTAL) BY MOUTH AT BEDTIME. 90 capsule 1   No current facility-administered medications on file prior to visit.    ALLERGIES: Allergies  Allergen Reactions  . Compazine Anaphylaxis    Hypotension  . Metoclopramide Hcl Anaphylaxis    Hypotension  . Gluten Meal Other (See Comments)    Vitamin B irregulaties  . Morphine And Related Nausea Only    FAMILY HISTORY: Family History  Problem Relation Age of Onset  . Hyperlipidemia Mother   . Heart disease Mother 76    massive MI  . Hyperlipidemia Father   . Breast cancer Maternal Grandmother   . Hyperlipidemia Maternal Grandmother   . Ovarian cancer Other   . Anesthesia problems Neg Hx   . Malignant hyperthermia Neg Hx   . Pseudochol deficiency Neg Hx   . Hypotension Neg Hx   . COPD Father     severe, smoker  . Coronary artery disease Maternal Grandmother 27    CABG    SOCIAL HISTORY: Social History   Social History  . Marital Status: Married    Spouse Name: N/A  . Number of Children: N/A  . Years of Education: N/A   Occupational History  . Not on file.   Social History Main Topics  . Smoking status: Never Smoker   . Smokeless tobacco: Not on file  . Alcohol Use: No  . Drug Use: No  . Sexual Activity: Yes    Birth Control/ Protection: Surgical     Comment: Married lives with spouse, 3 children. 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 Not on file   Social History Narrative   Ongoing divorce from husband, separated since 05/2014. Lives alone     Works at [employer] and [employer].  Education: high school 12th grade.       REVIEW OF SYSTEMS: Constitutional: No fevers, chills, or sweats, no generalized fatigue, change in appetite Eyes: No visual  changes, double vision, eye pain Ear, nose and throat: No hearing loss, ear pain, nasal congestion, sore throat Cardiovascular: No chest pain, palpitations Respiratory:  No shortness of breath at rest or with exertion, wheezes GastrointestinaI: No nausea, vomiting, diarrhea, abdominal pain, fecal incontinence Genitourinary:  No dysuria, urinary retention or frequency Musculoskeletal:  No neck pain, back pain Integumentary: No rash, pruritus, skin lesions Neurological: as above Psychiatric: depression Endocrine: No palpitations, fatigue, diaphoresis, mood swings, change in appetite, change in weight, increased thirst Hematologic/Lymphatic:  No anemia, purpura, petechiae. Allergic/Immunologic: no itchy/runny eyes, nasal congestion, recent allergic reactions, rashes  PHYSICAL EXAM: Filed Vitals:   08/20/15 0909  BP: 112/70  Pulse: 122   General: No acute distress.  Patient appears well-groomed.  normal body habitus. Head:  Normocephalic/atraumatic Eyes:  Fundoscopic exam unremarkable without vessel changes, exudates, hemorrhages or papilledema. Neck: supple, no paraspinal tenderness, full range of motion Heart:  Regular rate and rhythm Lungs:  Clear to auscultation bilaterally Back: No paraspinal tenderness Neurological Exam: alert and oriented to person, place, and time. Attention span and concentration intact, recent and remote memory intact, fund of knowledge intact.  Speech fluent and not dysarthric, language intact.  CN II-XII intact. Fundoscopic exam unremarkable without vessel changes, exudates, hemorrhages or papilledema.  Bulk and tone normal, muscle strength 5/5 throughout.  Sensation to light touch, temperature and vibration intact.  Deep tendon reflexes 2+ throughout, toes downgoing.  Finger to nose and heel to shin testing intact.  Gait normal, Romberg negative.  IMPRESSION: Frequent falls, possibly with loss of consciousness Lumbosacral radiculopathy History of spontaneous  intracranial hypotension  PLAN: 1.  Check MRI of brain with and without contrast 2.  EEG 3.  Radicular pain as per pain management/orthopedists 4.  I would have her contact Dr. Wallace Cullens at [facility] regarding possibility of another CSF leak.  We will perform my testing first to try and sort some of this out. 5.  Also informed her of [place] law stating that she should not drive for 6 months after unexplained loss of consciousness. 6.  Follow up after testing.  Shon Millet, DO  CC:  Rene Paci, MD  Margarita Rana, MD

## 2015-08-22 ENCOUNTER — Telehealth: Payer: Self-pay

## 2015-08-22 ENCOUNTER — Ambulatory Visit (INDEPENDENT_AMBULATORY_CARE_PROVIDER_SITE_OTHER): Payer: 59 | Admitting: Neurology

## 2015-08-22 DIAGNOSIS — R55 Syncope and collapse: Secondary | ICD-10-CM

## 2015-08-22 DIAGNOSIS — R296 Repeated falls: Secondary | ICD-10-CM | POA: Diagnosis not present

## 2015-08-22 NOTE — Procedures (Signed)
ELECTROENCEPHALOGRAM REPORT  Date of Study: 08/22/2015  Patient's Name: Melissa Kirby MRN: [id number] Date of Birth: 1960-12-06  Indication: Frequent falls with questionable loss of consciousness  Medications: Fluoxetine Nortriptyline Levothyroxine  Technical Summary: This is a multichannel digital EEG recording, using the international 10-20 placement system with electrodes applied with paste and impedances below 5000 ohms.    Description: The EEG background is symmetric, with a well-developed posterior dominant rhythm of 9.5 Hz, which is reactive to eye opening and closing.  Diffuse beta activity is seen, with a bilateral frontal preponderance.  No focal or generalized abnormalities are seen.  No focal or generalized epileptiform discharges are seen.  Stage II sleep is not seen.  Hyperventilation and photic stimulation were performed, and produced no abnormalities.  ECG revealed normal cardiac rate and rhythm.  Impression: This is a normal routine EEG of the awake and drowsy states, with activating procedures.  A normal study does not rule out the possibility of a seizure disorder in this patient.  Rayhaan Huster R. Everlena Cooper, DO

## 2015-08-22 NOTE — Telephone Encounter (Signed)
-----   Message from Drema Dallas, DO sent at 08/22/2015  8:48 AM EST ----- EEG normal

## 2015-08-22 NOTE — Telephone Encounter (Signed)
Message relayed to patient. Verbalized understanding and denied questions.   

## 2015-08-24 ENCOUNTER — Ambulatory Visit
Admission: RE | Admit: 2015-08-24 | Discharge: 2015-08-24 | Disposition: A | Discharge status: Home / Self Care | Payer: 59 | Source: Ambulatory Visit | Attending: Neurology | Admitting: Neurology

## 2015-08-24 ENCOUNTER — Telehealth: Payer: Self-pay

## 2015-08-24 DIAGNOSIS — R55 Syncope and collapse: Secondary | ICD-10-CM

## 2015-08-24 DIAGNOSIS — R296 Repeated falls: Secondary | ICD-10-CM

## 2015-08-24 MED ORDER — GADOBENATE DIMEGLUMINE 529 MG/ML IV SOLN
12.0000 mL | Freq: Once | INTRAVENOUS | Status: AC | PRN
  Administered 2015-08-24: 12 mL via INTRAVENOUS

## 2015-08-24 NOTE — Telephone Encounter (Signed)
Message relayed to patient. Verbalized understanding and denied questions.   

## 2015-08-24 NOTE — Telephone Encounter (Signed)
VM-PT left message she was returning your call/Dawn CB# [phone]

## 2015-08-24 NOTE — Telephone Encounter (Signed)
Left message on machine for pt to return call to the office.  

## 2015-08-24 NOTE — Telephone Encounter (Signed)
-----   Message from Drema Dallas, DO sent at 08/24/2015 11:23 AM EST ----- The MRI of the brain is unremarkable.  There is no evidence of low-pressure headache, as seen on prior imaging.  If the headaches are similar, I would first have her see Dr. Wallace Cullens.  She may follow up with me afterwards.

## 2015-09-10 ENCOUNTER — Ambulatory Visit (INDEPENDENT_AMBULATORY_CARE_PROVIDER_SITE_OTHER): Payer: 59 | Admitting: Internal Medicine

## 2015-09-10 ENCOUNTER — Encounter: Payer: Self-pay | Admitting: Internal Medicine

## 2015-09-10 VITALS — BP 140/94 | HR 128 | Temp 99.7°F | Resp 18 | Ht 64.0 in | Wt 141.0 lb

## 2015-09-10 DIAGNOSIS — J209 Acute bronchitis, unspecified: Secondary | ICD-10-CM

## 2015-09-10 DIAGNOSIS — Z13820 Encounter for screening for osteoporosis: Secondary | ICD-10-CM

## 2015-09-10 DIAGNOSIS — Z0000 Encounter for general adult medical examination without abnormal findings: Secondary | ICD-10-CM

## 2015-09-10 DIAGNOSIS — E559 Vitamin D deficiency, unspecified: Secondary | ICD-10-CM

## 2015-09-10 DIAGNOSIS — G43809 Other migraine, not intractable, without status migrainosus: Secondary | ICD-10-CM | POA: Diagnosis not present

## 2015-09-10 DIAGNOSIS — E039 Hypothyroidism, unspecified: Secondary | ICD-10-CM

## 2015-09-10 MED ORDER — DOXYCYCLINE HYCLATE 100 MG PO TABS: 100.0000 mg | ORAL_TABLET | Freq: Two times a day (BID) | ORAL | Status: DC

## 2015-09-10 MED ORDER — HYDROCOD POLST-CPM POLST ER 10-8 MG/5ML PO SUER: 5.0000 mL | Freq: Two times a day (BID) | ORAL | Status: DC | PRN

## 2015-09-10 MED ORDER — THERA VITAL M PO TABS: 1.0000 | ORAL_TABLET | Freq: Every day | ORAL | Status: DC

## 2015-09-10 MED ORDER — FLUTICASONE PROPIONATE HFA 110 MCG/ACT IN AERO: 1.0000 | INHALATION_SPRAY | Freq: Two times a day (BID) | RESPIRATORY_TRACT | Status: DC

## 2015-09-10 MED ORDER — ALBUTEROL SULFATE HFA 108 (90 BASE) MCG/ACT IN AERS: 2.0000 | INHALATION_SPRAY | Freq: Four times a day (QID) | RESPIRATORY_TRACT | Status: DC | PRN

## 2015-09-10 NOTE — Patient Instructions (Signed)
We have reviewed your prior records including labs and tests today.  Test(s) ordered today. Your results will be released to [place] (or called to you) after review, usually within 72hours after test completion. If any changes need to be made, you will be notified at that same time.  All other Health Maintenance issues reviewed.   All recommended immunizations and age-appropriate screenings are up-to-date.  No immunizations administered today.   Medications reviewed and updated.  Changes include the antibiotic, cough syrup and inhaler for your cold symptoms.  Your other medications have not changed.  A dexa scan was ordered.     Health Maintenance, Female Adopting a healthy lifestyle and getting preventive care can go a long way to promote health and wellness. Talk with your health care provider about what schedule of regular examinations is right for you. This is a good chance for you to check in with your provider about disease prevention and staying healthy. In between checkups, there are plenty of things you can do on your own. Experts have done a lot of research about which lifestyle changes and preventive measures are most likely to keep you healthy. Ask your health care provider for more information. WEIGHT AND DIET  Eat a healthy diet  Be sure to include plenty of vegetables, fruits, low-fat dairy products, and lean protein.  Do not eat a lot of foods high in solid fats, added sugars, or salt.  Get regular exercise. This is one of the most important things you can do for your health.  Most adults should exercise for at least 150 minutes each week. The exercise should increase your heart rate and make you sweat (moderate-intensity exercise).  Most adults should also do strengthening exercises at least twice a week. This is in addition to the moderate-intensity exercise.  Maintain a healthy weight  Body mass index (BMI) is a measurement that can be used to identify possible  weight problems. It estimates body fat based on height and weight. Your health care provider can help determine your BMI and help you achieve or maintain a healthy weight.  For females 11 years of age and older:   A BMI below 18.5 is considered underweight.  A BMI of 18.5 to 24.9 is normal.  A BMI of 25 to 29.9 is considered overweight.  A BMI of 30 and above is considered obese.  Watch levels of cholesterol and blood lipids  You should start having your blood tested for lipids and cholesterol at 54 years of age, then have this test every 5 years.  You may need to have your cholesterol levels checked more often if:  Your lipid or cholesterol levels are high.  You are older than 54 years of age.  You are at high risk for heart disease.  CANCER SCREENING   Lung Cancer  Lung cancer screening is recommended for adults 76-43 years old who are at high risk for lung cancer because of a history of smoking.  A yearly low-dose CT scan of the lungs is recommended for people who:  Currently smoke.  Have quit within the past 15 years.  Have at least a 30-pack-year history of smoking. A pack year is smoking an average of one pack of cigarettes a day for 1 year.  Yearly screening should continue until it has been 15 years since you quit.  Yearly screening should stop if you develop a health problem that would prevent you from having lung cancer treatment.  Breast Cancer  Practice breast  self-awareness. This means understanding how your breasts normally appear and feel.  It also means doing regular breast self-exams. Let your health care provider know about any changes, no matter how small.  If you are in your 20s or 30s, you should have a clinical breast exam (CBE) by a health care provider every 1-3 years as part of a regular health exam.  If you are 39 or older, have a CBE every year. Also consider having a breast X-ray (mammogram) every year.  If you have a family history of  breast cancer, talk to your health care provider about genetic screening.  If you are at high risk for breast cancer, talk to your health care provider about having an MRI and a mammogram every year.  Breast cancer gene (BRCA) assessment is recommended for women who have family members with BRCA-related cancers. BRCA-related cancers include:  Breast.  Ovarian.  Tubal.  Peritoneal cancers.  Results of the assessment will determine the need for genetic counseling and BRCA1 and BRCA2 testing. Cervical Cancer Your health care provider may recommend that you be screened regularly for cancer of the pelvic organs (ovaries, uterus, and vagina). This screening involves a pelvic examination, including checking for microscopic changes to the surface of your cervix (Pap test). You may be encouraged to have this screening done every 3 years, beginning at age 11.  For women ages 35-65, health care providers may recommend pelvic exams and Pap testing every 3 years, or they may recommend the Pap and pelvic exam, combined with testing for human papilloma virus (HPV), every 5 years. Some types of HPV increase your risk of cervical cancer. Testing for HPV may also be done on women of any age with unclear Pap test results.  Other health care providers may not recommend any screening for nonpregnant women who are considered low risk for pelvic cancer and who do not have symptoms. Ask your health care provider if a screening pelvic exam is right for you.  If you have had past treatment for cervical cancer or a condition that could lead to cancer, you need Pap tests and screening for cancer for at least 20 years after your treatment. If Pap tests have been discontinued, your risk factors (such as having a new sexual partner) need to be reassessed to determine if screening should resume. Some women have medical problems that increase the chance of getting cervical cancer. In these cases, your health care provider may  recommend more frequent screening and Pap tests. Colorectal Cancer  This type of cancer can be detected and often prevented.  Routine colorectal cancer screening usually begins at 54 years of age and continues through 54 years of age.  Your health care provider may recommend screening at an earlier age if you have risk factors for colon cancer.  Your health care provider may also recommend using home test kits to check for hidden blood in the stool.  A small camera at the end of a tube can be used to examine your colon directly (sigmoidoscopy or colonoscopy). This is done to check for the earliest forms of colorectal cancer.  Routine screening usually begins at age 49.  Direct examination of the colon should be repeated every 5-10 years through 54 years of age. However, you may need to be screened more often if early forms of precancerous polyps or small growths are found. Skin Cancer  Check your skin from head to toe regularly.  Tell your health care provider about any new moles  or changes in moles, especially if there is a change in a mole's shape or color.  Also tell your health care provider if you have a mole that is larger than the size of a pencil eraser.  Always use sunscreen. Apply sunscreen liberally and repeatedly throughout the day.  Protect yourself by wearing long sleeves, pants, a wide-brimmed hat, and sunglasses whenever you are outside. HEART DISEASE, DIABETES, AND HIGH BLOOD PRESSURE   High blood pressure causes heart disease and increases the risk of stroke. High blood pressure is more likely to develop in:  People who have blood pressure in the high end of the normal range (130-139/85-89 mm Hg).  People who are overweight or obese.  People who are African American.  If you are 20-14 years of age, have your blood pressure checked every 3-5 years. If you are 27 years of age or older, have your blood pressure checked every year. You should have your blood  pressure measured twice--once when you are at a hospital or clinic, and once when you are not at a hospital or clinic. Record the average of the two measurements. To check your blood pressure when you are not at a hospital or clinic, you can use:  An automated blood pressure machine at a pharmacy.  A home blood pressure monitor.  If you are between 14 years and 46 years old, ask your health care provider if you should take aspirin to prevent strokes.  Have regular diabetes screenings. This involves taking a blood sample to check your fasting blood sugar level.  If you are at a normal weight and have a low risk for diabetes, have this test once every three years after 54 years of age.  If you are overweight and have a high risk for diabetes, consider being tested at a younger age or more often. PREVENTING INFECTION  Hepatitis B  If you have a higher risk for hepatitis B, you should be screened for this virus. You are considered at high risk for hepatitis B if:  You were born in a country where hepatitis B is common. Ask your health care provider which countries are considered high risk.  Your parents were born in a high-risk country, and you have not been immunized against hepatitis B (hepatitis B vaccine).  You have HIV or AIDS.  You use needles to inject street drugs.  You live with someone who has hepatitis B.  You have had sex with someone who has hepatitis B.  You get hemodialysis treatment.  You take certain medicines for conditions, including cancer, organ transplantation, and autoimmune conditions. Hepatitis C  Blood testing is recommended for:  Everyone born from [birth year] through 1964.  Anyone with known risk factors for hepatitis C. Sexually transmitted infections (STIs)  You should be screened for sexually transmitted infections (STIs) including gonorrhea and chlamydia if:  You are sexually active and are younger than 54 years of age.  You are older than 54 years  of age and your health care provider tells you that you are at risk for this type of infection.  Your sexual activity has changed since you were last screened and you are at an increased risk for chlamydia or gonorrhea. Ask your health care provider if you are at risk.  If you do not have HIV, but are at risk, it may be recommended that you take a prescription medicine daily to prevent HIV infection. This is called pre-exposure prophylaxis (PrEP). You are considered at risk if:  You are sexually active and do not regularly use condoms or know the HIV status of your partner(s).  You take drugs by injection.  You are sexually active with a partner who has HIV. Talk with your health care provider about whether you are at high risk of being infected with HIV. If you choose to begin PrEP, you should first be tested for HIV. You should then be tested every 3 months for as long as you are taking PrEP.  PREGNANCY   If you are premenopausal and you may become pregnant, ask your health care provider about preconception counseling.  If you may become pregnant, take 400 to 800 micrograms (mcg) of folic acid every day.  If you want to prevent pregnancy, talk to your health care provider about birth control (contraception). OSTEOPOROSIS AND MENOPAUSE   Osteoporosis is a disease in which the bones lose minerals and strength with aging. This can result in serious bone fractures. Your risk for osteoporosis can be identified using a bone density scan.  If you are 59 years of age or older, or if you are at risk for osteoporosis and fractures, ask your health care provider if you should be screened.  Ask your health care provider whether you should take a calcium or vitamin D supplement to lower your risk for osteoporosis.  Menopause may have certain physical symptoms and risks.  Hormone replacement therapy may reduce some of these symptoms and risks. Talk to your health care provider about whether hormone  replacement therapy is right for you.  HOME CARE INSTRUCTIONS   Schedule regular health, dental, and eye exams.  Stay current with your immunizations.   Do not use any tobacco products including cigarettes, chewing tobacco, or electronic cigarettes.  If you are pregnant, do not drink alcohol.  If you are breastfeeding, limit how much and how often you drink alcohol.  Limit alcohol intake to no more than 1 drink per day for nonpregnant women. One drink equals 12 ounces of beer, 5 ounces of wine, or 1 ounces of hard liquor.  Do not use street drugs.  Do not share needles.  Ask your health care provider for help if you need support or information about quitting drugs.  Tell your health care provider if you often feel depressed.  Tell your health care provider if you have ever been abused or do not feel safe at home.   This information is not intended to replace advice given to you by your health care provider. Make sure you discuss any questions you have with your health care provider.   Document Released: 01/20/2011 Document Revised: 07/28/2014 Document Reviewed: 06/08/2013 Elsevier Interactive Patient Education [employer].

## 2015-09-10 NOTE — Assessment & Plan Note (Signed)
Check tsh  Titrate med dose if needed  

## 2015-09-10 NOTE — Progress Notes (Signed)
Subjective:    Patient ID: Melissa Kirby, female    DOB: 1961/06/15, 55 y.o.   MRN: [id number]  HPI She is here to establish with a new pcp.  She is here for a physical exam.  Cold symptoms: 2 days ago she was raking leaves and started to feel sick. Her symptoms progressed very quickly. Over the past couple of days she has had a fever ranging from 99-102. She has nasal congestion with initially green nasal discharge that has improved or cleared slightly. She has productive cough with green phlegm, chest tightness, and shortness of breath . She denies a history of asthma, but does develop asthma-like symptoms when she gets colds. She denies any ear pain, sinus pain, stomach symptoms, muscle aches and headaches. She is taking Mucinex, cough syrup and Tylenol.  Hypothyroidism:  She is taking her medication daily.  She denies any recent changes in energy or weight that are unexplained.   Migraine headaches: She is gluten sensitive and coming off her migraines have improved. She also takes nortriptyline nightly. She feels her migraines are well controlled.  She is taking all her medication daily as prescribed. She denies any changes in her health except for her recent illness since she was here last.  She is no longer taking the Prozac. She was placed on this during her divorce and currently denies depression and anxiety.  Medications and allergies reviewed with patient and updated if appropriate.  Patient Active Problem List   Diagnosis Date Noted  . Intracranial hypotension 01/24/2014  . Unspecified vitamin D deficiency   . Abnormal brain MRI 10/15/2013  . LBP (low back pain) 05/10/2012  . Fibromyalgia   . Atrophic vaginitis 10/17/2010  . Depression 10/17/2010  . GASTRITIS 11/12/2009  . Vitamin B12 deficiency (non anemic) 10/04/2009  . Hypothyroidism 10/03/2009  . ANEMIA-NOS 10/03/2009  . MIGRAINE HEADACHE 10/03/2009    Current Outpatient Prescriptions on File Prior to Visit    Medication Sig Dispense Refill  . levothyroxine (SYNTHROID, LEVOTHROID) 150 MCG tablet Take 1 tablet (150 mcg total) by mouth every Tuesday, Wednesday, Thursday, Saturday, and Sunday at 6 PM. 90 tablet 3  . levothyroxine (SYNTHROID, LEVOTHROID) 175 MCG tablet Take 1 tablet (175 mcg total) by mouth 2 (two) times a week. On Monday and Friday 30 tablet 3  . Multiple Vitamin (MULTIVITAMIN) tablet Take 1 tablet by mouth daily.     . nortriptyline (PAMELOR) 50 MG capsule TAKE 2 CAPSULES (100 MG TOTAL) BY MOUTH AT BEDTIME. 90 capsule 1   No current facility-administered medications on file prior to visit.    Past Medical History  Diagnosis Date  . VITAMIN B12 DEFICIENCY 09/2009 dx  . Unspecified hypothyroidism 1982/2000    surgical resection of benign growth - partial then complete  . GERD (gastroesophageal reflux disease)     per endo. - does not take any meds.    Marland Kitchen MIGRAINE HEADACHE     last Migraine 06/29/2011-   . ANEMIA, IRON DEFICIENCY   . Kidney stones 1998, 2005    x 2  . Depression     therapy  . Fibromyalgia 1999    no meds  . Cerebrospinal fluid leak from spinal puncture     improved with 7th patch at [facility] 05/2014    Past Surgical History  Procedure Laterality Date  . Total thyroidectomy  2000    Had thyroid nodule, nonmalignant ([id number]) resection  . Cholecystectomy  2009  . Tubal ligation  1989  .  Diagnostic laparoscopy  1982    diagnostic  . Thyroid surgery  1983    partial   . Posterior fusion lumbar spine  06/2011    Social History   Social History  . Marital Status: Married    Spouse Name: N/A  . Number of Children: N/A  . Years of Education: N/A   Social History Main Topics  . Smoking status: Never Smoker   . Smokeless tobacco: None  . Alcohol Use: No  . Drug Use: No  . Sexual Activity: Yes    Birth Control/ Protection: Surgical     Comment: Married lives with spouse, 3 children. employed at [employer], swing shift   Other Topics  Concern  . None   Social History Narrative   Ongoing divorce from husband, separated since 05/2014. Lives alone     Works at [employer] and [employer].  Education: high school 12th grade.       Family History  Problem Relation Age of Onset  . Hyperlipidemia Mother   . Heart disease Mother 32    massive MI  . Hyperlipidemia Father   . Breast cancer Maternal Grandmother   . Hyperlipidemia Maternal Grandmother   . Ovarian cancer Other   . Anesthesia problems Neg Hx   . Malignant hyperthermia Neg Hx   . Pseudochol deficiency Neg Hx   . Hypotension Neg Hx   . COPD Father     severe, smoker  . Coronary artery disease Maternal Grandmother 78    CABG    Review of Systems  Constitutional: Positive for fever and fatigue.  HENT: Positive for congestion. Negative for ear pain, sinus pressure and sore throat.   Respiratory: Positive for cough, chest tightness and shortness of breath. Negative for wheezing.   Cardiovascular: Negative for chest pain, palpitations and leg swelling.  Gastrointestinal: Negative for nausea, abdominal pain, diarrhea, constipation and blood in stool.       No GERD  Endocrine: Negative for polydipsia and polyuria.  Genitourinary: Negative for dysuria and hematuria.  Musculoskeletal: Positive for back pain (near surgical site). Negative for myalgias and arthralgias.  Skin: Negative for color change and rash.  Neurological: Negative for dizziness, light-headedness and headaches.  Psychiatric/Behavioral: Negative for dysphoric mood. The patient is not nervous/anxious.        Objective:   Filed Vitals:   09/10/15 1046  BP: 140/94  Pulse: 128  Temp: 99.7 F (37.6 C)  Resp: 18   Filed Weights   09/10/15 1046  Weight: 141 lb (63.957 kg)   Body mass index is 24.19 kg/(m^2).   Physical Exam Constitutional: She appears well-developed and well-nourished. In mild distress from acute upper respiratory illness.  HENT:  Head: Normocephalic and atraumatic.  Right  Ear: External ear normal. Normal ear canal and TM Left Ear: External ear normal.  Normal ear canal and TM Mouth/Throat: Oropharynx is erythematous. No exudate   Normal bilateral ear canals and tympanic membranes  Eyes: Conjunctivae and EOM are normal.  Neck: Neck supple. No tracheal deviation present. No thyromegaly present-scar present from thyroidectomy.  No carotid bruit  Cardiovascular: Tachycardic, regular rhythm and normal heart sounds.   No murmur heard.  No edema. Pulmonary/Chest: Effort normal and breath sounds normal. No respiratory distress. She has no wheezes. She has no rales.  Breast: deferred to Gyn Abdominal: Soft. She exhibits no distension. There is no tenderness.  Lymphadenopathy: She has mild bilateral cervical adenopathy.  Skin: Skin is warm and dry. She is not diaphoretic.  Psychiatric: She has a normal mood and affect. Her behavior is normal.       Assessment & Plan:   Physical exam: Screening blood work ordered Immunizations up to date Mammogram  Up to date Gyn  Up to date Dexa-ordered. She is postmenopausal. She also has elevated alkaline phosphatase. Exercising regularly. Encouraged her to continue regular exercise Weight-normal BMI Skin -no skin concerns or changes Substance abuse, no concern for substance abuse  Acute bronchitis Likely a bacterial Her oxygen saturation is good and have a low suspicion for pneumonia We will prescribe doxycycline twice daily for 10 days Tussionex cough syrup Albuterol inhaler as needed Flovent inhaler sample given to be used twice daily Note given for work-no work for 3 days Call if no improvement or any worsening  Tachycardia Her rhythm is normal on exam Likely sinus tachycardia related to acute upper respiratory illness Her oxygenation is good and since her rhythm is normal and we'll defer an EKG Basic blood work ordered for her physical exam  See Problem List for Assessment and Plan of chronic medical  problems.   Follow-up annually

## 2015-09-10 NOTE — Progress Notes (Signed)
Pre visit review using our clinic review tool, if applicable. No additional management support is needed unless otherwise documented below in the visit note. 

## 2015-09-10 NOTE — Assessment & Plan Note (Signed)
Taking a multivitamin daily Check vitamin D level

## 2015-09-10 NOTE — Assessment & Plan Note (Signed)
Takes nortriptyline daily Also on a gluten free diet

## 2015-09-12 ENCOUNTER — Telehealth: Payer: Self-pay | Admitting: Internal Medicine

## 2015-09-12 NOTE — Telephone Encounter (Signed)
Pt called in said that she still has a fever of 102.1 and could not go to work to[place].  She needs a work note for today and tomorrow.

## 2015-09-12 NOTE — Telephone Encounter (Signed)
Ok to give note - she needs to follow up if she is not feeling better by the end of the week

## 2015-09-12 NOTE — Telephone Encounter (Signed)
Please advise 

## 2015-09-13 ENCOUNTER — Encounter: Payer: Self-pay | Admitting: Emergency Medicine

## 2015-09-13 NOTE — Telephone Encounter (Signed)
Spoke with pt. Pt stated that her son would come by the office to pick up her work note.

## 2015-09-14 ENCOUNTER — Encounter: Payer: Self-pay | Admitting: Nurse Practitioner

## 2015-09-14 ENCOUNTER — Ambulatory Visit (INDEPENDENT_AMBULATORY_CARE_PROVIDER_SITE_OTHER): Payer: 59 | Admitting: Nurse Practitioner

## 2015-09-14 VITALS — BP 112/82 | HR 113 | Temp 98.5°F | Wt 142.0 lb

## 2015-09-14 DIAGNOSIS — J209 Acute bronchitis, unspecified: Secondary | ICD-10-CM

## 2015-09-14 NOTE — Progress Notes (Signed)
Patient ID: Melissa Kirby, female    DOB: 1961/06/01  Age: 55 y.o. MRN: [id number]  CC: No chief complaint on file.   HPI Melissa Kirby presents for CC of follow up from Monday.   1) patient was seen for her preventative healthcare on 09/10/2015 by her PCP.  Coughing and right side neck pain has persisted: See below Doxy x 10 days Tussionex cough syrup Albuterol and Flovent Needs note for work Fever broke last night   Patient reports she is still having some coughing and has right-sided neck pain with turning head She feels this is due to sleeping position  History Cordell has a past medical history of VITAMIN B12 DEFICIENCY (09/2009 dx); Unspecified hypothyroidism (1982/2000); GERD (gastroesophageal reflux disease); MIGRAINE HEADACHE; ANEMIA, IRON DEFICIENCY; Kidney stones (1998, 2005); Depression; Fibromyalgia (1999); Cerebrospinal fluid leak from spinal puncture; and GASTRITIS (11/12/2009).   She has past surgical history that includes Total thyroidectomy (2000); Cholecystectomy (2009); Tubal ligation (1989); Diagnostic laparoscopy (4098); Thyroid surgery (1983); and Posterior fusion lumbar spine (06/2011).   Her family history includes Breast cancer in her maternal grandmother; COPD in her father; Coronary artery disease (age of onset: 22) in her maternal grandmother; Heart disease (age of onset: 50) in her mother; Hyperlipidemia in her father, maternal grandmother, and mother; Ovarian cancer in her other. There is no history of Anesthesia problems, Malignant hyperthermia, Pseudochol deficiency, or Hypotension.She reports that she has never smoked. She has never used smokeless tobacco. She reports that she does not drink alcohol or use illicit drugs.  Outpatient Prescriptions Prior to Visit  Medication Sig Dispense Refill  . albuterol (PROVENTIL HFA;VENTOLIN HFA) 108 (90 Base) MCG/ACT inhaler Inhale 2 puffs into the lungs every 6 (six) hours as needed for wheezing or shortness of breath. 1  Inhaler 2  . chlorpheniramine-HYDROcodone (TUSSIONEX PENNKINETIC ER) 10-8 MG/5ML SUER Take 5 mLs by mouth every 12 (twelve) hours as needed for cough. 100 mL 0  . doxycycline (VIBRA-TABS) 100 MG tablet Take 1 tablet (100 mg total) by mouth 2 (two) times daily. 20 tablet 0  . fluticasone (FLOVENT HFA) 110 MCG/ACT inhaler Inhale 1 puff into the lungs 2 (two) times daily. 1 Inhaler 12  . levothyroxine (SYNTHROID, LEVOTHROID) 150 MCG tablet Take 1 tablet (150 mcg total) by mouth every Tuesday, Wednesday, Thursday, Saturday, and Sunday at 6 PM. 90 tablet 3  . levothyroxine (SYNTHROID, LEVOTHROID) 175 MCG tablet Take 1 tablet (175 mcg total) by mouth 2 (two) times a week. On Monday and Friday 30 tablet 3  . Multiple Vitamin (MULTIVITAMIN) tablet Take 1 tablet by mouth daily.     . Multiple Vitamins-Minerals (MULTIVITAMIN) tablet Take 1 tablet by mouth daily.    . nortriptyline (PAMELOR) 50 MG capsule TAKE 2 CAPSULES (100 MG TOTAL) BY MOUTH AT BEDTIME. 90 capsule 1   No facility-administered medications prior to visit.    ROS Review of Systems  Constitutional: Positive for fever and fatigue. Negative for chills and diaphoresis.  HENT: Positive for congestion and sore throat.   Respiratory: Positive for cough. Negative for chest tightness, shortness of breath and wheezing.   Cardiovascular: Negative for chest pain, palpitations and leg swelling.  Gastrointestinal: Negative for nausea, vomiting and diarrhea.  Musculoskeletal: Positive for myalgias and neck pain.  Skin: Negative for rash.  Neurological: Negative for dizziness and headaches.    Objective:  BP 112/82 mmHg  Pulse 113  Temp(Src) 98.5 F (36.9 C) (Oral)  Wt 142 lb (64.411 kg)  SpO2 97% Repeat  104  Physical Exam  Constitutional: She is oriented to person, place, and time. She appears well-developed and well-nourished. No distress.  HENT:  Head: Normocephalic and atraumatic.  Right Ear: External ear normal.  Left Ear: External  ear normal.  Mouth/Throat: Oropharynx is clear and moist. No oropharyngeal exudate.  Eyes: EOM are normal. Pupils are equal, round, and reactive to light. Right eye exhibits no discharge. Left eye exhibits no discharge. No scleral icterus.  Neck: Normal range of motion.  Trapezius tightness > on right side versus left  Cardiovascular: Regular rhythm and normal heart sounds.  Exam reveals no gallop and no friction rub.   No murmur heard. Tachycardic  Pulmonary/Chest: Effort normal and breath sounds normal. No respiratory distress. She has no wheezes. She has no rales. She exhibits no tenderness.  Neurological: She is alert and oriented to person, place, and time. Coordination normal.  Skin: Skin is warm and dry. No rash noted. She is not diaphoretic.  Psychiatric: She has a normal mood and affect. Her behavior is normal. Judgment and thought content normal.   Assessment & Plan:   Diagnoses and all orders for this visit:  Acute bronchitis, unspecified organism   I am having Ms. Hardie Pulley maintain her multivitamin, levothyroxine, levothyroxine, nortriptyline, multivitamin, chlorpheniramine-HYDROcodone, doxycycline, albuterol, and fluticasone.  No orders of the defined types were placed in this encounter.     Follow-up: Return if symptoms worsen or fail to improve.

## 2015-09-14 NOTE — Patient Instructions (Signed)
Chloraseptic spray and lots of water   Acute Bronchitis Bronchitis is inflammation of the airways that extend from the windpipe into the lungs (bronchi). The inflammation often causes mucus to develop. This leads to a cough, which is the most common symptom of bronchitis.  In acute bronchitis, the condition usually develops suddenly and goes away over time, usually in a couple weeks. Smoking, allergies, and asthma can make bronchitis worse. Repeated episodes of bronchitis may cause further lung problems.  CAUSES Acute bronchitis is most often caused by the same virus that causes a cold. The virus can spread from person to person (contagious) through coughing, sneezing, and touching contaminated objects. SIGNS AND SYMPTOMS   Cough.   Fever.   Coughing up mucus.   Body aches.   Chest congestion.   Chills.   Shortness of breath.   Sore throat.  DIAGNOSIS  Acute bronchitis is usually diagnosed through a physical exam. Your health care provider will also ask you questions about your medical history. Tests, such as chest X-rays, are sometimes done to rule out other conditions.  TREATMENT  Acute bronchitis usually goes away in a couple weeks. Oftentimes, no medical treatment is necessary. Medicines are sometimes given for relief of fever or cough. Antibiotic medicines are usually not needed but may be prescribed in certain situations. In some cases, an inhaler may be recommended to help reduce shortness of breath and control the cough. A cool mist vaporizer may also be used to help thin bronchial secretions and make it easier to clear the chest.  HOME CARE INSTRUCTIONS  Get plenty of rest.   Drink enough fluids to keep your urine clear or pale yellow (unless you have a medical condition that requires fluid restriction). Increasing fluids may help thin your respiratory secretions (sputum) and reduce chest congestion, and it will prevent dehydration.   Take medicines only as  directed by your health care provider.  If you were prescribed an antibiotic medicine, finish it all even if you start to feel better.  Avoid smoking and secondhand smoke. Exposure to cigarette smoke or irritating chemicals will make bronchitis worse. If you are a smoker, consider using nicotine gum or skin patches to help control withdrawal symptoms. Quitting smoking will help your lungs heal faster.   Reduce the chances of another bout of acute bronchitis by washing your hands frequently, avoiding people with cold symptoms, and trying not to touch your hands to your mouth, nose, or eyes.   Keep all follow-up visits as directed by your health care provider.  SEEK MEDICAL CARE IF: Your symptoms do not improve after 1 week of treatment.  SEEK IMMEDIATE MEDICAL CARE IF:  You develop an increased fever or chills.   You have chest pain.   You have severe shortness of breath.  You have bloody sputum.   You develop dehydration.  You faint or repeatedly feel like you are going to pass out.  You develop repeated vomiting.  You develop a severe headache. MAKE SURE YOU:   Understand these instructions.  Will watch your condition.  Will get help right away if you are not doing well or get worse.   This information is not intended to replace advice given to you by your health care provider. Make sure you discuss any questions you have with your health care provider.   Document Released: 08/14/2004 Document Revised: 07/28/2014 Document Reviewed: 12/28/2012 Elsevier Interactive Patient Education [employer]

## 2015-09-14 NOTE — Progress Notes (Signed)
Pre visit review using our clinic review tool, if applicable. No additional management support is needed unless otherwise documented below in the visit note. 

## 2015-09-21 ENCOUNTER — Telehealth: Payer: Self-pay

## 2015-09-21 ENCOUNTER — Other Ambulatory Visit (INDEPENDENT_AMBULATORY_CARE_PROVIDER_SITE_OTHER): Payer: 59

## 2015-09-21 DIAGNOSIS — Z7689 Persons encountering health services in other specified circumstances: Secondary | ICD-10-CM

## 2015-09-21 DIAGNOSIS — Z0000 Encounter for general adult medical examination without abnormal findings: Secondary | ICD-10-CM

## 2015-09-21 DIAGNOSIS — E559 Vitamin D deficiency, unspecified: Secondary | ICD-10-CM

## 2015-09-21 LAB — LIPID PANEL
Cholesterol: 233 mg/dL — ABNORMAL HIGH (ref 0–200)
HDL: 63.2 mg/dL (ref >39.00)
LDL Cholesterol: 154 mg/dL — ABNORMAL HIGH (ref 0–99)
NonHDL: 169.61
Total CHOL/HDL Ratio: 4
Triglycerides: 76 mg/dL (ref 0.0–149.0)
VLDL: 15.2 mg/dL (ref 0.0–40.0)

## 2015-09-21 LAB — COMPREHENSIVE METABOLIC PANEL
ALT: 13 U/L (ref 0–35)
AST: 18 U/L (ref 0–37)
Albumin: 4.2 g/dL (ref 3.5–5.2)
Alkaline Phosphatase: 160 U/L — ABNORMAL HIGH (ref 39–117)
BUN: 10 mg/dL (ref 6–23)
CO2: 27 mEq/L (ref 19–32)
Calcium: 9.8 mg/dL (ref 8.4–10.5)
Chloride: 104 mEq/L (ref 96–112)
Creatinine, Ser: 0.71 mg/dL (ref 0.40–1.20)
GFR: 91.37 mL/min (ref >60.00)
Glucose, Bld: 91 mg/dL (ref 70–99)
Potassium: 4.1 mEq/L (ref 3.5–5.1)
Sodium: 139 mEq/L (ref 135–145)
Total Bilirubin: 0.5 mg/dL (ref 0.2–1.2)
Total Protein: 8.1 g/dL (ref 6.0–8.3)

## 2015-09-21 LAB — CBC WITH DIFFERENTIAL/PLATELET
Basophils Absolute: 0 10*3/uL (ref 0.0–0.1)
Basophils Relative: 0.5 % (ref 0.0–3.0)
Eosinophils Absolute: 0.1 10*3/uL (ref 0.0–0.7)
Eosinophils Relative: 1.8 % (ref 0.0–5.0)
HCT: 37.3 % (ref 36.0–46.0)
Hemoglobin: 12.4 g/dL (ref 12.0–15.0)
Lymphocytes Relative: 34.1 % (ref 12.0–46.0)
Lymphs Abs: 2.7 10*3/uL (ref 0.7–4.0)
MCHC: 33.1 g/dL (ref 30.0–36.0)
MCV: 87.4 fl (ref 78.0–100.0)
Monocytes Absolute: 0.9 10*3/uL (ref 0.1–1.0)
Monocytes Relative: 10.9 % (ref 3.0–12.0)
Neutro Abs: 4.2 10*3/uL (ref 1.4–7.7)
Neutrophils Relative %: 52.7 % (ref 43.0–77.0)
Platelets: 356 10*3/uL (ref 150.0–400.0)
RBC: 4.27 Mil/uL (ref 3.87–5.11)
RDW: 14 % (ref 11.5–15.5)
WBC: 8 10*3/uL (ref 4.0–10.5)

## 2015-09-21 LAB — TSH: TSH: 0.12 u[IU]/mL — ABNORMAL LOW (ref 0.35–4.50)

## 2015-09-21 NOTE — Telephone Encounter (Signed)
FMLA signed by PCP. Pt informed and mailed copy to her.   Copy sent to scan and copy placed in Dahlia's office with charge capture sheet

## 2015-09-21 NOTE — Telephone Encounter (Signed)
Received FMLA from [facility] for pt.  Called pt to ask the reason for the FMLA. Pt stated that it was for the acute bronchitis pt had 09/10/15 to 09/16/15.   Form completed filled out and given to PCP to sign.

## 2015-09-23 ENCOUNTER — Encounter: Payer: Self-pay | Admitting: Nurse Practitioner

## 2015-09-23 DIAGNOSIS — J209 Acute bronchitis, unspecified: Secondary | ICD-10-CM | POA: Insufficient documentation

## 2015-09-23 NOTE — Assessment & Plan Note (Addendum)
Established problem slightly improving Patient needed another note for work to remain out due to slowly improving symptoms. Patient is still completing doxycycline and has Tussionex cough syrup. No further prescription meds were given. Advised lots of hydration from non-caffeinated sources and OTC measures for supportive care of muscle complaints of right-sided neck. Follow-up with PCP as needed

## 2015-09-24 LAB — VITAMIN D 1,25 DIHYDROXY
Vitamin D 1, 25 (OH)2 Total: 33 pg/mL (ref 18–72)
Vitamin D2 1, 25 (OH)2: <8 pg/mL
Vitamin D3 1, 25 (OH)2: 33 pg/mL

## 2015-09-25 ENCOUNTER — Other Ambulatory Visit: Payer: Self-pay | Admitting: Internal Medicine

## 2015-09-25 DIAGNOSIS — E039 Hypothyroidism, unspecified: Secondary | ICD-10-CM

## 2015-09-25 MED ORDER — LEVOTHYROXINE SODIUM 150 MCG PO TABS: 150.0000 ug | ORAL_TABLET | Freq: Every day | ORAL | Status: DC

## 2015-10-22 ENCOUNTER — Other Ambulatory Visit: Payer: Self-pay | Admitting: Internal Medicine

## 2015-12-04 ENCOUNTER — Other Ambulatory Visit: Payer: Self-pay | Admitting: Internal Medicine

## 2016-01-09 ENCOUNTER — Other Ambulatory Visit: Payer: Self-pay | Admitting: Internal Medicine

## 2016-04-20 ENCOUNTER — Encounter: Payer: Self-pay | Admitting: Student

## 2016-05-04 NOTE — Patient Instructions (Addendum)
Blood work, urine tests and a Ct scan of your abdomen/pelvis were ordered.   Test(s) ordered today. Your results will be released to MyChart (or called to you) after review, usually within 72hours after test completion. If any changes need to be made, you will be notified at that same time.   No immunizations administered today.   Medications reviewed and updated.  No changes recommended at this time.   [facility]

## 2016-05-04 NOTE — Assessment & Plan Note (Signed)
Check tsh  Titrate med dose if needed  

## 2016-05-04 NOTE — Progress Notes (Signed)
Subjective:    Patient ID: Melissa Kirby, female    DOB: August 04, 1960, 55 y.o.   MRN: [id number]  HPI She is here for follow up.  Intracranial hypotension:  Years ago she had a spontaneous CSF leak at T7-8.  Recently, for one month, she is having symptoms she had with her prior CSF leak.  She has tingling in her head, zaps for one second where her equilibrium is off, and pressure in the back of her head.    Left side abdominal pain:  She started having pain 5 days ago and it was initially intermittent.  The pain is there all the time now.  Years ago she had ovarian cysts.  She went through menopause about 10 years ago.  She denies urinary symptoms or changes in her bowels.  She denies fever, nausea.    Medications and allergies reviewed with patient and updated if appropriate.  Patient Active Problem List   Diagnosis Date Noted  . Intracranial hypotension 01/24/2014  . Vitamin D deficiency   . Abnormal brain MRI 10/15/2013  . LBP (low back pain) 05/10/2012  . Fibromyalgia   . Atrophic vaginitis 10/17/2010  . Depression 10/17/2010  . Vitamin B12 deficiency (non anemic) 10/04/2009  . Hypothyroidism 10/03/2009  . Migraine headache 10/03/2009    Current Outpatient Prescriptions on File Prior to Visit  Medication Sig Dispense Refill  . levothyroxine (SYNTHROID, LEVOTHROID) 150 MCG tablet TAKE 1 TABLET BY MOUTH EVERY TUESDAY,WEDNESDAY,THURSDAY,SATURDAY AND SUNDAY AT 6PM 90 tablet 2  . nortriptyline (PAMELOR) 50 MG capsule TAKE 2 CAPSULES (100 MG TOTAL) BY MOUTH AT BEDTIME. 180 capsule 1   No current facility-administered medications on file prior to visit.     Past Medical History:  Diagnosis Date  . ANEMIA, IRON DEFICIENCY   . Cerebrospinal fluid leak from spinal puncture    [facility]  . Depression    therapy  . Fibromyalgia 1999   no meds  . GASTRITIS 11/12/2009   Qualifier: Diagnosis of  By: Theadora Ramaellis RN, Silvio PateShelia    . GERD (gastroesophageal reflux  disease)    per endo. - does not take any meds.    . Kidney stones 1998, 2005   x 2  . MIGRAINE HEADACHE    last Migraine 06/29/2011-   . Unspecified hypothyroidism 1982/2000   surgical resection of benign growth - partial then complete  . VITAMIN B12 DEFICIENCY 09/2009 dx    Past Surgical History:  Procedure Laterality Date  . CHOLECYSTECTOMY  2009  . DIAGNOSTIC LAPAROSCOPY  1982   diagnostic  . POSTERIOR FUSION LUMBAR SPINE  06/2011  . THYROID SURGERY  1983   partial   . TOTAL THYROIDECTOMY  2000   Had thyroid nodule, nonmalignant (4098-1191(1982-2000) resection  . TUBAL LIGATION  1989    Social History   Social History  . Marital status: Married    Spouse name: N/A  . Number of children: N/A  . Years of education: N/A   Social History Main Topics  . Smoking status: Never Smoker  . Smokeless tobacco: Never Used  . Alcohol use No  . Drug use: No  . Sexual activity: Yes    Birth control/ protection: Surgical     Comment: Married lives with spouse, 3 children. employed at [employer], swing shift   Other Topics Concern  . Not on file   Social History Narrative   Ongoing divorce from husband, separated since 05/2014. Lives alone  Works at [employer].  Education: high school 12th grade.      Exercises regularly       Family History  Problem Relation Age of Onset  . Hyperlipidemia Mother   . Heart disease Mother 36    massive MI  . Hyperlipidemia Father   . COPD Father     severe, smoker  . Breast cancer Maternal Grandmother   . Hyperlipidemia Maternal Grandmother   . Coronary artery disease Maternal Grandmother 78    CABG  . Ovarian cancer Other   . Anesthesia problems Neg Hx   . Malignant hyperthermia Neg Hx   . Pseudochol deficiency Neg Hx   . Hypotension Neg Hx     Review of Systems  Constitutional: Negative for chills and fever.  Gastrointestinal: Positive for abdominal pain. Negative for blood in stool, constipation, diarrhea and  nausea.       No gerd  Genitourinary: Negative for difficulty urinating, dysuria, frequency and hematuria.  Neurological: Positive for numbness (tingling in face) and headaches. Negative for dizziness and light-headedness.       Objective:   Vitals:   05/05/16 1303  BP: (!) 128/92  Pulse: (!) 107  Resp: 16  Temp: 98.5 F (36.9 C)   Filed Weights   05/05/16 1303  Weight: 142 lb (64.4 kg)   Body mass index is 24.37 kg/m.   Physical Exam  Constitutional: She is oriented to person, place, and time. She appears well-developed and well-nourished. No distress.  HENT:  Head: Normocephalic and atraumatic.  Eyes: Conjunctivae are normal.  Abdominal: Soft. Bowel sounds are normal. She exhibits no distension and no mass. There is tenderness (LLQ). There is guarding. There is no rebound.  Musculoskeletal: She exhibits no edema.  Neurological: She is alert and oriented to person, place, and time.  Skin: Skin is warm and dry. She is not diaphoretic.       Assessment & Plan:   See Problem List for Assessment and Plan of chronic medical problems.

## 2016-05-05 ENCOUNTER — Other Ambulatory Visit (INDEPENDENT_AMBULATORY_CARE_PROVIDER_SITE_OTHER): Payer: 59

## 2016-05-05 ENCOUNTER — Ambulatory Visit (INDEPENDENT_AMBULATORY_CARE_PROVIDER_SITE_OTHER): Payer: 59 | Admitting: Internal Medicine

## 2016-05-05 ENCOUNTER — Encounter: Payer: Self-pay | Admitting: Internal Medicine

## 2016-05-05 ENCOUNTER — Other Ambulatory Visit: Payer: Self-pay | Admitting: Internal Medicine

## 2016-05-05 VITALS — BP 128/92 | HR 107 | Temp 98.5°F | Resp 16 | Wt 142.0 lb

## 2016-05-05 DIAGNOSIS — E039 Hypothyroidism, unspecified: Secondary | ICD-10-CM | POA: Diagnosis not present

## 2016-05-05 DIAGNOSIS — G96810 Intracranial hypotension, unspecified: Secondary | ICD-10-CM

## 2016-05-05 DIAGNOSIS — R1032 Left lower quadrant pain: Secondary | ICD-10-CM

## 2016-05-05 DIAGNOSIS — G9389 Other specified disorders of brain: Secondary | ICD-10-CM

## 2016-05-05 LAB — CBC WITH DIFFERENTIAL/PLATELET
Basophils Absolute: 0 10*3/uL (ref 0.0–0.1)
Basophils Relative: 0.5 % (ref 0.0–3.0)
Eosinophils Absolute: 0.2 10*3/uL (ref 0.0–0.7)
Eosinophils Relative: 1.7 % (ref 0.0–5.0)
HCT: 37.3 % (ref 36.0–46.0)
Hemoglobin: 12.5 g/dL (ref 12.0–15.0)
Lymphocytes Relative: 33.2 % (ref 12.0–46.0)
Lymphs Abs: 2.9 10*3/uL (ref 0.7–4.0)
MCHC: 33.5 g/dL (ref 30.0–36.0)
MCV: 87.5 fl (ref 78.0–100.0)
Monocytes Absolute: 0.8 10*3/uL (ref 0.1–1.0)
Monocytes Relative: 9.2 % (ref 3.0–12.0)
Neutro Abs: 4.9 10*3/uL (ref 1.4–7.7)
Neutrophils Relative %: 55.4 % (ref 43.0–77.0)
Platelets: 272 10*3/uL (ref 150.0–400.0)
RBC: 4.26 Mil/uL (ref 3.87–5.11)
RDW: 13.3 % (ref 11.5–15.5)
WBC: 8.8 10*3/uL (ref 4.0–10.5)

## 2016-05-05 LAB — URINALYSIS, ROUTINE W REFLEX MICROSCOPIC
Bilirubin Urine: NEGATIVE
Hgb urine dipstick: NEGATIVE
Ketones, ur: NEGATIVE
Nitrite: NEGATIVE
Specific Gravity, Urine: <=1.005 — AB
Total Protein, Urine: NEGATIVE
Urine Glucose: NEGATIVE
Urobilinogen, UA: 0.2
pH: 6 (ref 5.0–8.0)

## 2016-05-05 LAB — COMPREHENSIVE METABOLIC PANEL WITH GFR
ALT: 12 U/L (ref 0–35)
AST: 16 U/L (ref 0–37)
Albumin: 4.5 g/dL (ref 3.5–5.2)
Alkaline Phosphatase: 139 U/L — ABNORMAL HIGH (ref 39–117)
BUN: 8 mg/dL (ref 6–23)
CO2: 27 meq/L (ref 19–32)
Calcium: 9.9 mg/dL (ref 8.4–10.5)
Chloride: 103 meq/L (ref 96–112)
Creatinine, Ser: 0.86 mg/dL (ref 0.40–1.20)
GFR: 73.07 mL/min
Glucose, Bld: 96 mg/dL (ref 70–99)
Potassium: 4.4 meq/L (ref 3.5–5.1)
Sodium: 139 meq/L (ref 135–145)
Total Bilirubin: 0.4 mg/dL (ref 0.2–1.2)
Total Protein: 8.4 g/dL — ABNORMAL HIGH (ref 6.0–8.3)

## 2016-05-05 LAB — TSH: TSH: 0.08 u[IU]/mL — ABNORMAL LOW (ref 0.35–4.50)

## 2016-05-05 MED ORDER — NITROFURANTOIN MONOHYD MACRO 100 MG PO CAPS: 100.0000 mg | ORAL_CAPSULE | Freq: Two times a day (BID) | ORAL | 0 refills | Status: DC

## 2016-05-05 MED ORDER — LEVOTHYROXINE SODIUM 100 MCG PO TABS: 100.0000 ug | ORAL_TABLET | Freq: Every day | ORAL | 3 refills | Status: DC

## 2016-05-05 NOTE — Assessment & Plan Note (Signed)
Started 5 days ago And has progressed and is now constant and she is very tender on exam with evidence of guarding No other associated symptoms including changes in bowels, urinary symptoms History of ovarian cysts, but is postmenopausal Will check urinalysis, urine culture, CBC, CMP and CT of the abdomen and pelvis

## 2016-05-05 NOTE — Assessment & Plan Note (Addendum)
Having symptoms consistent with a possible csf leak, which had in the past. The leak was at T7-T8 with spontaneous We [facility]-we can do imaging prior to her seeing him, but will await to see what he prefers

## 2016-05-05 NOTE — Progress Notes (Signed)
Pre visit review using our clinic review tool, if applicable. No additional management support is needed unless otherwise documented below in the visit note. 

## 2016-05-06 ENCOUNTER — Ambulatory Visit (INDEPENDENT_AMBULATORY_CARE_PROVIDER_SITE_OTHER)
Admission: RE | Admit: 2016-05-06 | Discharge: 2016-05-06 | Disposition: A | Discharge status: Home / Self Care | Payer: 59 | Source: Ambulatory Visit | Attending: Internal Medicine | Admitting: Internal Medicine

## 2016-05-06 DIAGNOSIS — R1032 Left lower quadrant pain: Secondary | ICD-10-CM | POA: Diagnosis not present

## 2016-05-06 LAB — URINE CULTURE: Organism ID, Bacteria: NO GROWTH

## 2016-05-06 MED ORDER — IOPAMIDOL (ISOVUE-300) INJECTION 61%
100.0000 mL | Freq: Once | INTRAVENOUS | Status: AC | PRN
  Administered 2016-05-06: 100 mL via INTRAVENOUS

## 2016-07-06 IMAGING — MR MR HEAD WO/W CM
11 of 12 series · 42 of 48 positions shown · IV contrast (multihance)
Comparison: MRI brain 10/13/2013.

CLINICAL DATA: Recurrent headaches and pressure in head. Personal
history of intracranial hypotension. The patient symptoms previously
improved with thoracic and lumbar blood patch is.

EXAM:
MRI HEAD WITHOUT AND WITH CONTRAST
TECHNIQUE: Multiplanar, multiecho pulse sequences of the brain and surrounding
structures were obtained without and with intravenous contrast.
CONTRAST:  12mL MULTIHANCE GADOBENATE DIMEGLUMINE 529 MG/ML IV SOLN

[Series 2: T1 · sagittal · 5.0mm · 0.45mm/px · 1 of 20 slices shown]
[im 1/20]
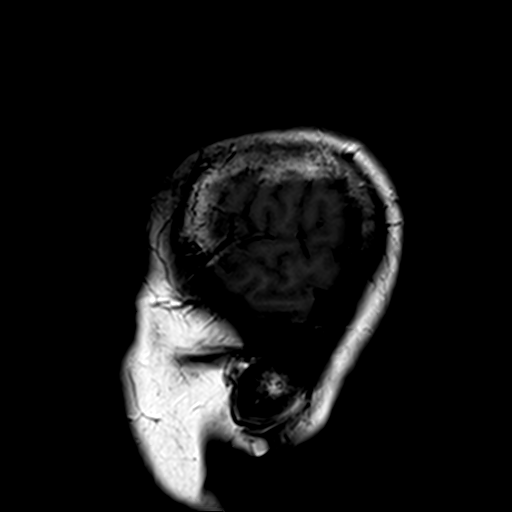

[Series 3: DWI · axial · 5.0mm · 1.80mm/px · z∈[-68,+74]mm · 4 of 46 slices shown (1 of 4)]
[im 1/46]
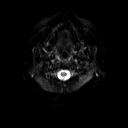
[im 16/46]
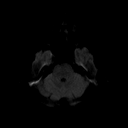
[im 31/46]
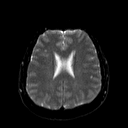
[im 46/46]
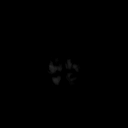

[Series 4: DWI · axial · 5.0mm · 1.80mm/px · z∈[-68,+74]mm · 2 of 23 slices shown (2 of 4)]
[im 1/23]
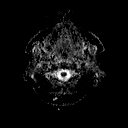
[im 23/23]
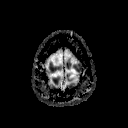

[Series 6: swi_images · axial · 2.0mm · 0.90mm/px · z∈[-77,+81]mm · 8 of 80 slices shown]
[im 1/80]
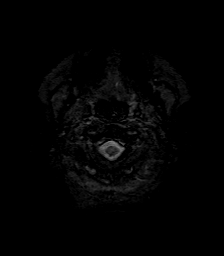
[im 12/80]
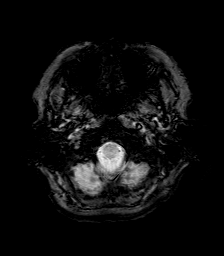
[im 23/80]
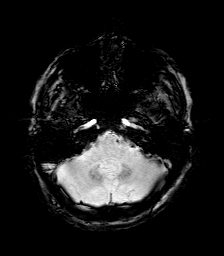
[im 34/80]
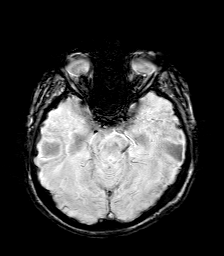
[im 46/80]
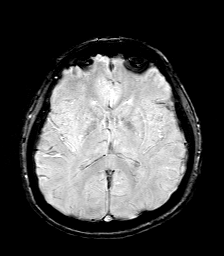
[im 57/80]
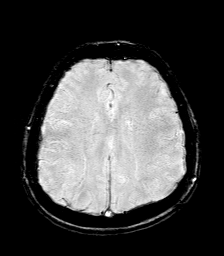
[im 68/80]
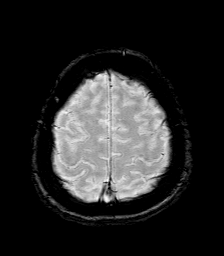
[im 80/80]
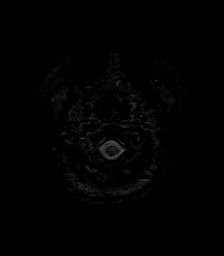

[Series 7: DWI · coronal · 5.0mm · 1.80mm/px · 6 of 60 slices shown (3 of 4)]
[im 1/60]
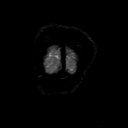
[im 12/60]
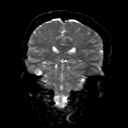
[im 24/60]
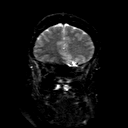
[im 36/60]
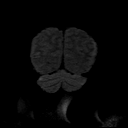
[im 48/60]
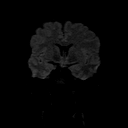
[im 60/60]
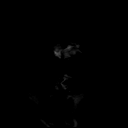

[Series 8: DWI · coronal · 5.0mm · 1.80mm/px · 3 of 30 slices shown (4 of 4)]
[im 1/30]
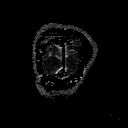
[im 15/30]
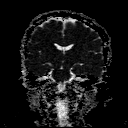
[im 30/30]
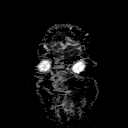

[Series 9: T2 · axial · 5.0mm · 0.45mm/px · z∈[-68,+75]mm · 2 of 23 slices shown (1 of 2)]
[im 1/23]
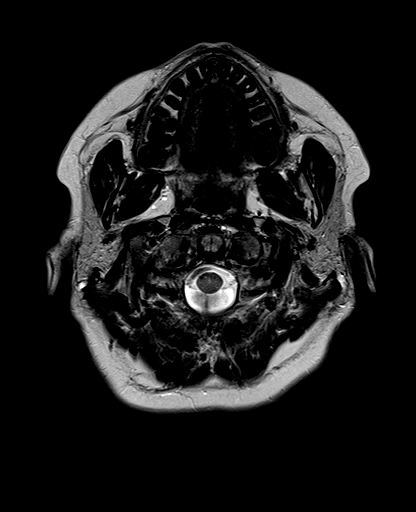
[im 23/23]
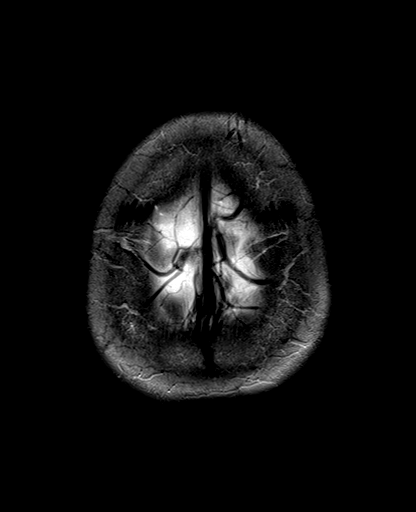

[Series 10: FLAIR · axial · 5.0mm · 0.45mm/px · z∈[-68,+75]mm · 2 of 23 slices shown]
[im 1/23]
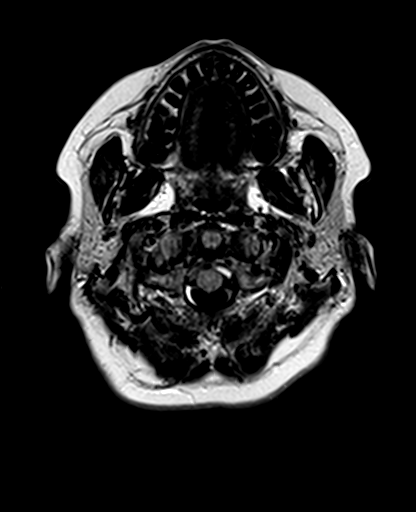
[im 23/23]
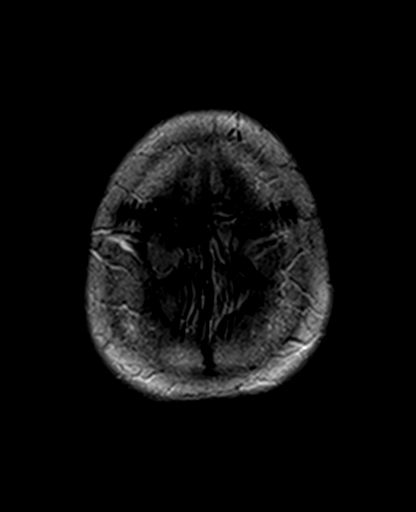

[Series 11: mpr tra · axial · 2.0mm · 0.45mm/px · z∈[-76,+82]mm · 8 of 80 slices shown]
[im 1/80]
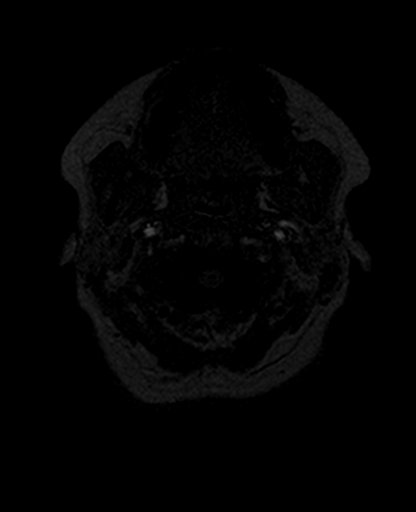
[im 12/80]
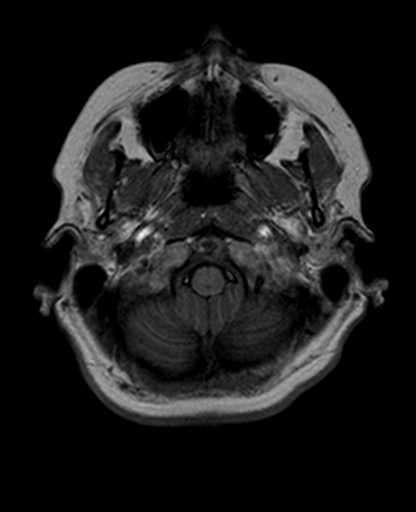
[im 23/80]
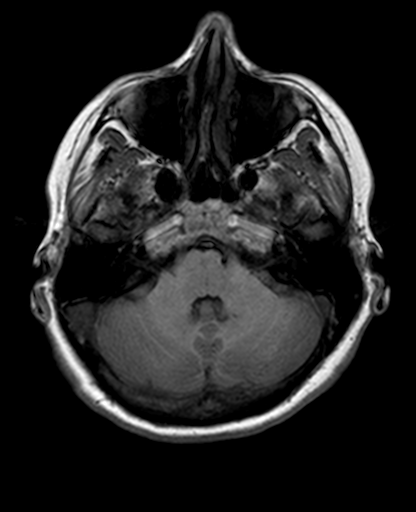
[im 34/80]
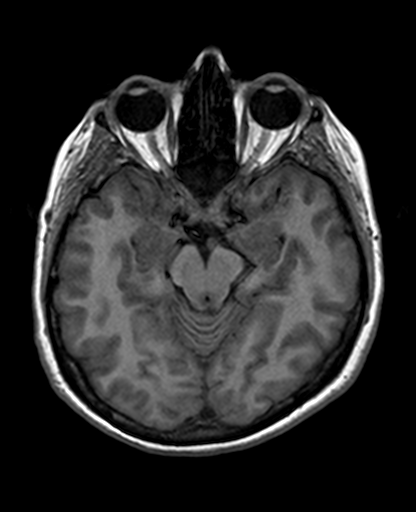
[im 46/80]
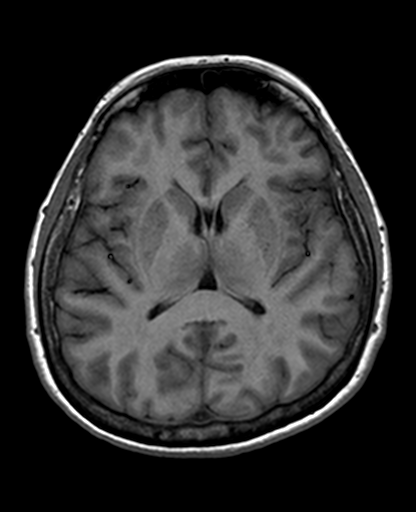
[im 57/80]
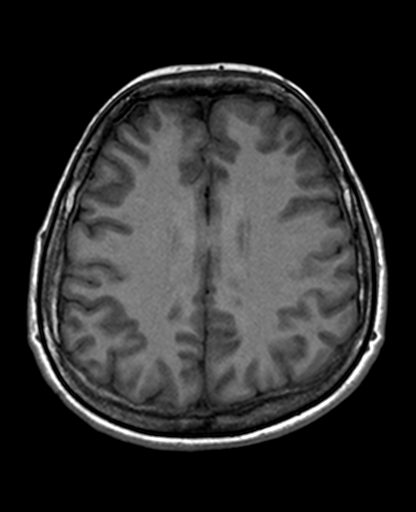
[im 68/80]
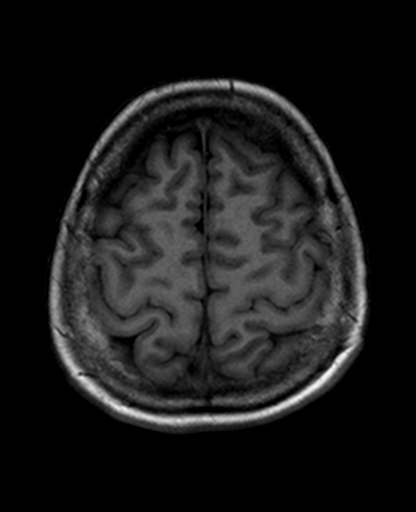
[im 80/80]
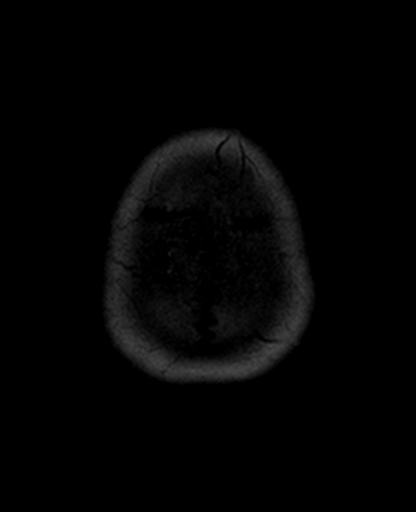

[Series 12: T2 · coronal · 5.0mm · 0.45mm/px · 2 of 22 slices shown (2 of 2)]
[im 1/22]
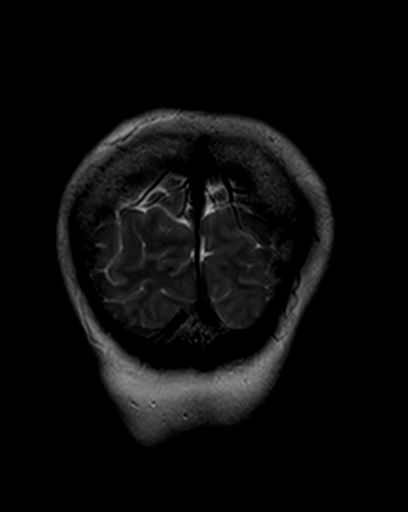
[im 22/22]
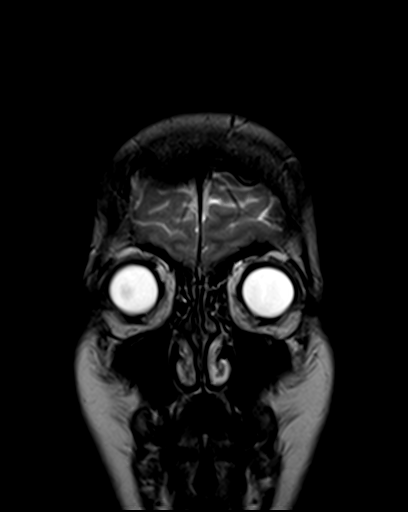

[Series 13: post mpr tra · axial · 2.0mm · 0.45mm/px · z∈[-76,-10]mm · 4 of 80 slices shown]
[im 1/80]
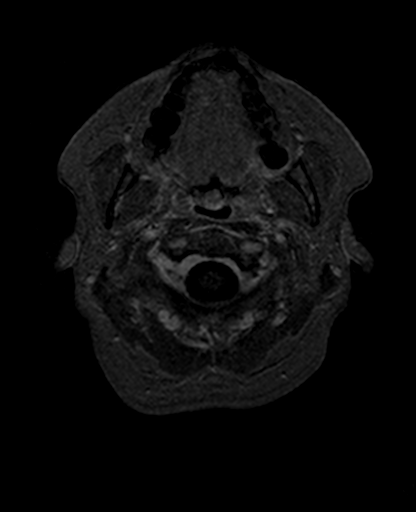
[im 12/80]
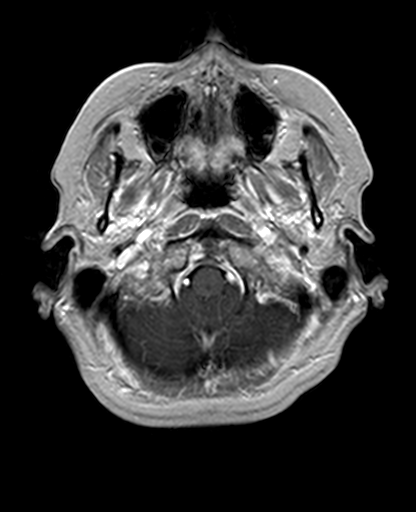
[im 23/80]
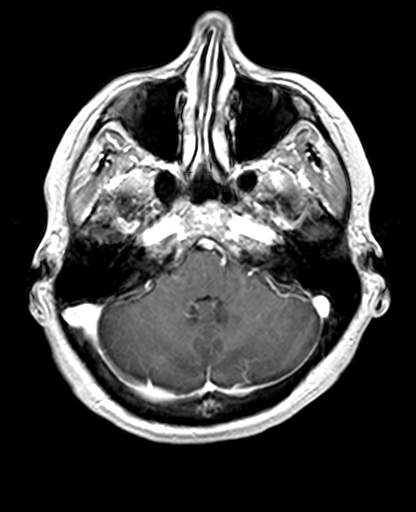
[im 34/80]
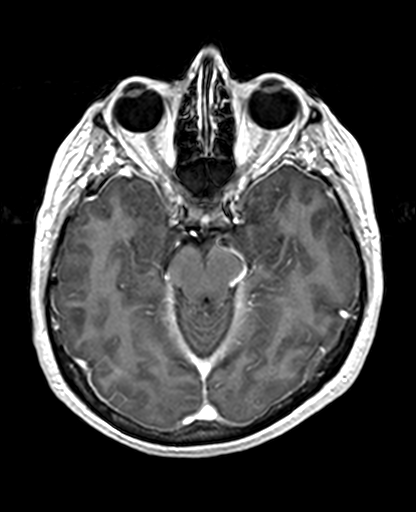

[42 of 48 positions shown; findings below may reference images not displayed]

FINDINGS: The cerebellar tonsils again extend slightly below the foramen
magnum. There is some distortion of the fourth ventricle.
Postcontrast images demonstrate mild diffuse dural enhancement.
There is flattening of the ventral pons.

No acute infarct, hemorrhage, or mass lesion is present. The
ventricles are of normal size. Insert pass fluid. Previously seen
FLAIR signal abnormality along the dura is not evident on today's
study. Scattered subcortical T2 hyperintensities are stable.

Flow is present in the major intracranial arteries. The globes and
orbits are intact. The paranasal sinuses and mastoid air cells are
clear.
IMPRESSION: 1. Aligned cerebellar tonsils and mild diffuse dural enhancement
compatible with recurrent or residual intracranial hypotension.
2. No acute intracranial abnormality.
3. Stable scattered subcortical T2 hyperintensities bilaterally. The
finding is nonspecific but can be seen in the setting of chronic
microvascular ischemia, a demyelinating process such as multiple
sclerosis, vasculitis, complicated migraine headaches, or as the
sequelae of a prior infectious or inflammatory process.

## 2016-08-20 ENCOUNTER — Ambulatory Visit (INDEPENDENT_AMBULATORY_CARE_PROVIDER_SITE_OTHER)
Admission: RE | Admit: 2016-08-20 | Discharge: 2016-08-20 | Disposition: A | Discharge status: Home / Self Care | Payer: 59 | Source: Ambulatory Visit | Attending: Internal Medicine | Admitting: Internal Medicine

## 2016-08-20 ENCOUNTER — Ambulatory Visit (INDEPENDENT_AMBULATORY_CARE_PROVIDER_SITE_OTHER): Payer: 59 | Admitting: Internal Medicine

## 2016-08-20 DIAGNOSIS — R05 Cough: Secondary | ICD-10-CM

## 2016-08-20 DIAGNOSIS — R062 Wheezing: Secondary | ICD-10-CM | POA: Diagnosis not present

## 2016-08-20 DIAGNOSIS — R059 Cough, unspecified: Secondary | ICD-10-CM

## 2016-08-20 DIAGNOSIS — J90 Pleural effusion, not elsewhere classified: Secondary | ICD-10-CM | POA: Diagnosis not present

## 2016-08-20 DIAGNOSIS — J9811 Atelectasis: Secondary | ICD-10-CM | POA: Diagnosis not present

## 2016-08-20 MED ORDER — HYDROCODONE-HOMATROPINE 5-1.5 MG/5ML PO SYRP: 5.0000 mL | ORAL_SOLUTION | Freq: Four times a day (QID) | ORAL | 0 refills | Status: AC | PRN

## 2016-08-20 MED ORDER — PREDNISONE 10 MG PO TABS: ORAL_TABLET | ORAL | 0 refills | Status: DC

## 2016-08-20 MED ORDER — ALBUTEROL SULFATE HFA 108 (90 BASE) MCG/ACT IN AERS: 2.0000 | INHALATION_SPRAY | Freq: Four times a day (QID) | RESPIRATORY_TRACT | 2 refills | Status: DC | PRN

## 2016-08-20 MED ORDER — LEVOFLOXACIN 500 MG PO TABS: 500.0000 mg | ORAL_TABLET | Freq: Every day | ORAL | 0 refills | Status: AC

## 2016-08-20 NOTE — Patient Instructions (Addendum)
Please take all new medication as prescribed - the antibiotic, cough medicine if needed, prednisone, and inhaler if needed  Please continue all other medications as before, and refills have been done if requested.  Please have the pharmacy call with any other refills you may need.  Please keep your appointments with your specialists as you may have planned  Your Chest xray was done today  You will be contacted by phone if any changes need to be made immediately.  Otherwise, you will receive a letter about your results with an explanation, but please check with MyChart first.  Please remember to sign up for MyChart if you have not done so, as this will be important to you in the future with finding out test results, communicating by private email, and scheduling acute appointments online when needed.  You are given the work note as well

## 2016-08-20 NOTE — Progress Notes (Signed)
Subjective:    Patient ID: Melissa Kirby, female    DOB: 1960-11-19, 56 y.o.   MRN: [id number]  HPI  Here with acute onset mild to mod 5 days ST, HA, general weakness and malaise, with prod cough greenish sputum, but Pt denies chest pain, increased sob or doe, wheezing, orthopnea, PND, increased LE swelling, palpitations, dizziness or syncope, exept for onset mild wheezing, chest tightness and incresaed WOB since last PM.  Pt denies new neurological symptoms such as new headache, or facial or extremity weakness or numbness   Pt denies polydipsia, polyuria, Past Medical History:  Diagnosis Date  . ANEMIA, IRON DEFICIENCY   . Cerebrospinal fluid leak from spinal puncture    improved with 7th patch at [facility] 05/2014  . Depression    therapy  . Fibromyalgia 1999   no meds  . GASTRITIS 11/12/2009   Qualifier: Diagnosis of  By: Theadora Rama RN, Silvio Pate    . GERD (gastroesophageal reflux disease)    per endo. - does not take any meds.    . Kidney stones 1998, 2005   x 2  . MIGRAINE HEADACHE    last Migraine 06/29/2011-   . Unspecified hypothyroidism 1982/2000   surgical resection of benign growth - partial then complete  . VITAMIN B12 DEFICIENCY 09/2009 dx   Past Surgical History:  Procedure Laterality Date  . CHOLECYSTECTOMY  2009  . DIAGNOSTIC LAPAROSCOPY  1982   diagnostic  . POSTERIOR FUSION LUMBAR SPINE  06/2011  . THYROID SURGERY  1983   partial   . TOTAL THYROIDECTOMY  2000   Had thyroid nodule, nonmalignant (4098-1191) resection  . TUBAL LIGATION  1989    reports that she has never smoked. She has never used smokeless tobacco. She reports that she does not drink alcohol or use drugs. family history includes Breast cancer in her maternal grandmother; COPD in her father; Coronary artery disease (age of onset: 58) in her maternal grandmother; Heart disease (age of onset: 63) in her mother; Hyperlipidemia in her father, maternal grandmother, and mother; Ovarian cancer in her  other. Allergies  Allergen Reactions  . Compazine Anaphylaxis    Hypotension  . Metoclopramide Hcl Anaphylaxis    Hypotension  . Prochlorperazine Edisylate     Other reaction(s): Other (See Comments) Hemodynamic instability  . Gluten Meal Other (See Comments)    Vitamin B irregulaties  . Morphine And Related Nausea Only   Current Outpatient Prescriptions on File Prior to Visit  Medication Sig Dispense Refill  . levothyroxine (SYNTHROID, LEVOTHROID) 100 MCG tablet Take 1 tablet (100 mcg total) by mouth daily. 90 tablet 3  . nitrofurantoin, macrocrystal-monohydrate, (MACROBID) 100 MG capsule Take 1 capsule (100 mg total) by mouth 2 (two) times daily. 14 capsule 0  . nortriptyline (PAMELOR) 50 MG capsule TAKE 2 CAPSULES (100 MG TOTAL) BY MOUTH AT BEDTIME. 180 capsule 1   No current facility-administered medications on file prior to visit.    Review of Systems  All other system neg per pt    Objective:   Physical Exam BP 128/72   Pulse (!) 104   Temp 98.8 F (37.1 C) (Oral)   Resp 20   Wt 143 lb (64.9 kg)   SpO2 97%   BMI 24.55 kg/m  VS noted, mild il Constitutional: Pt appears in no apparent distress HENT: Head: NCAT.  Right Ear: External ear normal.  Left Ear: External ear normal.  Bilat tm's with mild erythema.  Max sinus areas mild tender.  Pharynx  with mild erythema, no exudate Eyes: . Pupils are equal, round, and reactive to light. Conjunctivae and EOM are normal Neck: Normal range of motion. Neck supple.  Cardiovascular: Normal rate and regular rhythm.   Pulmonary/Chest: Effort normal and breath sounds decreased without rales but with few scattered wheezing. and decresaed RLL BS Neurological: Pt is alert. Not confused , motor grossly intact Skin: Skin is warm. No rash, no LE edema Psychiatric: Pt behavior is normal. No agitation.         Assessment & Plan:

## 2016-08-20 NOTE — Assessment & Plan Note (Signed)
Mild to mod, c/w bornchitis vs pna, for cxr,  for antibx course, cough med prn,  to f/u any worsening symptoms or concerns °

## 2016-08-20 NOTE — Assessment & Plan Note (Signed)
Mild to mod, for depomedrol IM 80, predpac asd, inhaler prn  to f/u any worsening symptoms or concerns

## 2016-08-20 NOTE — Progress Notes (Signed)
Pre visit review using our clinic review tool, if applicable. No additional management support is needed unless otherwise documented below in the visit note. 

## 2016-08-21 ENCOUNTER — Telehealth: Payer: Self-pay | Admitting: Internal Medicine

## 2016-08-21 NOTE — Telephone Encounter (Signed)
Patient is requesting a letter to be excused from work for yesterday, today and tomorrow to return to work on Monday 08/25/16.

## 2016-08-22 ENCOUNTER — Telehealth: Payer: Self-pay | Admitting: Internal Medicine

## 2016-08-22 NOTE — Telephone Encounter (Signed)
Patient states she was recently seen by Dr. Jonny RuizJohn.  He has prescribed her four scripts.  Patient would like to know what she can add to this to assist in this area.

## 2016-08-22 NOTE — Telephone Encounter (Signed)
Letter done

## 2016-08-22 NOTE — Progress Notes (Signed)
Letter done

## 2016-08-23 NOTE — Telephone Encounter (Signed)
She can take over the counter cold medications as needed.  Tylenol, mucinex, saline nasal spray or flonase.

## 2016-08-23 NOTE — Telephone Encounter (Signed)
Patient aware.

## 2016-08-26 ENCOUNTER — Telehealth: Payer: Self-pay | Admitting: Internal Medicine

## 2016-08-26 NOTE — Telephone Encounter (Signed)
Patient dropped by office stating that she was seen by Dr. Jonny RuizJohn last week and she's still not getting any better. She no longer has a fever, but her chest issues are not improving. She was wondering if there was something else that could be given, since the Prednisone is keeping her up all night. She also was hoping to get approval to work 1/2 days. Patient also dropped off FMLA papers to be filled out regarding her absences last week. Forms can be faxed once completed and left in Jamie's box to be picked up by CMA.

## 2016-08-26 NOTE — Telephone Encounter (Signed)
She should be taking the prednisone in the morning - if it is causing insomnia it is ok to stop.   What symptoms is she still having? Tightness?  Wheeze? Cough?  She may need to be re-evaluated - she was treated with a very strong antibiotic and high dose steroids - I would expect her to be feeling better.

## 2016-08-27 ENCOUNTER — Ambulatory Visit (INDEPENDENT_AMBULATORY_CARE_PROVIDER_SITE_OTHER): Payer: 59 | Admitting: Adult Health

## 2016-08-27 ENCOUNTER — Encounter: Payer: Self-pay | Admitting: Adult Health

## 2016-08-27 VITALS — BP 124/70 | Temp 98.2°F | Ht 64.0 in | Wt 144.8 lb

## 2016-08-27 DIAGNOSIS — M940 Chondrocostal junction syndrome [Tietze]: Secondary | ICD-10-CM

## 2016-08-27 MED ORDER — HYDROCODONE-ACETAMINOPHEN 5-325 MG PO TABS: 1.0000 | ORAL_TABLET | Freq: Three times a day (TID) | ORAL | 0 refills | Status: DC | PRN

## 2016-08-27 NOTE — Telephone Encounter (Signed)
Got patient scheduled at [facility] today.

## 2016-08-27 NOTE — Patient Instructions (Addendum)
Costochondritis Costochondritis is swelling and irritation (inflammation) of the tissue (cartilage) that connects your ribs to your breastbone (sternum). This causes pain in the front of your chest. Usually, the pain:  Starts gradually.  Is in more than one rib. This condition usually goes away on its own over time. Follow these instructions at home:  Do not do anything that makes your pain worse.  If directed, put ice on the painful area:  Put ice in a plastic bag.  Place a towel between your skin and the bag.  Leave the ice on for 20 minutes, 2-3 times a day.  If directed, put heat on the affected area as often as told by your doctor. Use the heat source that your doctor tells you to use, such as a moist heat pack or a heating pad.  Place a towel between your skin and the heat source.  Leave the heat on for 20-30 minutes.  Take off the heat if your skin turns bright red. This is very important if you cannot feel pain, heat, or cold. You may have a greater risk of getting burned.  Take over-the-counter and prescription medicines only as told by your doctor.  Return to your normal activities as told by your doctor. Ask your doctor what activities are safe for you.  Keep all follow-up visits as told by your doctor. This is important. Contact a doctor if:  You have chills or a fever.  Your pain does not go away or it gets worse.  You have a cough that does not go away. Get help right away if:  You are short of breath. This information is not intended to replace advice given to you by your health care provider. Make sure you discuss any questions you have with your health care provider. Document Released: 12/24/2007 Document Revised: 01/25/2016 Document Reviewed: 10/31/2015 Elsevier Interactive Patient Education  2017 Elsevier Inc.  

## 2016-08-27 NOTE — Progress Notes (Signed)
Subjective:    Patient ID: Melissa Kirby, female    DOB: 1960-08-01, 56 y.o.   MRN: [id number]  HPI  She was seen by Dr. Jonny Ruiz on 08/21/2015 and treated with Levaquin and prednisone as well as had a prednisone shot in the office. She was being ruled for either bronchitis or pneumonia. Chest x ray done on the same day   Showed:  Mild bibasilar subsegmental atelectasis. Small left pleural effusion  She reports that she has finished her antibiotics and has one day of prednisone therapy. She reports that she is unable to sleep due to the prednisone. She does report that her coughing has improved but she continues to feel SOB and have chest discomfort, especially with deep breath.   She denies any fevers.   She has been using Motrin and Sudafed.   Review of Systems  Constitutional: Positive for activity change and fatigue.  Respiratory: Positive for chest tightness and shortness of breath. Negative for cough.   Cardiovascular: Negative.   Gastrointestinal: Negative.   Genitourinary: Negative.   Musculoskeletal: Positive for arthralgias and myalgias. Negative for neck stiffness.  Neurological: Positive for headaches.  Psychiatric/Behavioral: Positive for sleep disturbance.  All other systems reviewed and are negative.  Past Medical History:  Diagnosis Date  . ANEMIA, IRON DEFICIENCY   . Cerebrospinal fluid leak from spinal puncture    improved with 7th patch at [facility] 05/2014  . Depression    therapy  . Fibromyalgia 1999   no meds  . GASTRITIS 11/12/2009   Qualifier: Diagnosis of  By: Theadora Rama RN, Silvio Pate    . GERD (gastroesophageal reflux disease)    per endo. - does not take any meds.    . Kidney stones 1998, 2005   x 2  . MIGRAINE HEADACHE    last Migraine 06/29/2011-   . Unspecified hypothyroidism 1982/2000   surgical resection of benign growth - partial then complete  . VITAMIN B12 DEFICIENCY 09/2009 dx    Social History   Social History  . Marital status: Married   Spouse name: N/A  . Number of children: N/A  . Years of education: N/A   Occupational History  . Not on file.   Social History Main Topics  . Smoking status: Never Smoker  . Smokeless tobacco: Never Used  . Alcohol use No  . Drug use: No  . Sexual activity: Yes    Birth control/ protection: Surgical     Comment: Married lives with spouse, 3 children. 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 Not on file   Social History Narrative   Ongoing divorce from husband, separated since 05/2014. Lives alone     Works at [employer] and [employer].  Education: high school 12th grade.      Exercises regularly       Past Surgical History:  Procedure Laterality Date  . CHOLECYSTECTOMY  2009  . DIAGNOSTIC LAPAROSCOPY  1982   diagnostic  . POSTERIOR FUSION LUMBAR SPINE  06/2011  . THYROID SURGERY  1983   partial   . TOTAL THYROIDECTOMY  2000   Had thyroid nodule, nonmalignant (4098-1191) resection  . TUBAL LIGATION  1989    Family History  Problem Relation Age of Onset  . Hyperlipidemia Mother   . Heart disease Mother 60    massive MI  . Hyperlipidemia Father   . COPD Father     severe, smoker  . Breast cancer Maternal Grandmother   .  Hyperlipidemia Maternal Grandmother   . Coronary artery disease Maternal Grandmother 78    CABG  . Ovarian cancer Other   . Anesthesia problems Neg Hx   . Malignant hyperthermia Neg Hx   . Pseudochol deficiency Neg Hx   . Hypotension Neg Hx     Allergies  Allergen Reactions  . Compazine Anaphylaxis    Hypotension  . Metoclopramide Hcl Anaphylaxis    Hypotension  . Prochlorperazine Edisylate     Other reaction(s): Other (See Comments) Hemodynamic instability  . Gluten Meal Other (See Comments)    Vitamin B irregulaties  . Morphine And Related Nausea Only    Current Outpatient Prescriptions on File Prior to Visit  Medication Sig Dispense Refill  . albuterol (PROVENTIL HFA;VENTOLIN HFA) 108 (90 Base)  MCG/ACT inhaler Inhale 2 puffs into the lungs every 6 (six) hours as needed for wheezing or shortness of breath. 1 Inhaler 2  . HYDROcodone-homatropine (HYCODAN) 5-1.5 MG/5ML syrup Take 5 mLs by mouth every 6 (six) hours as needed for cough. 180 mL 0  . levofloxacin (LEVAQUIN) 500 MG tablet Take 1 tablet (500 mg total) by mouth daily. 10 tablet 0  . levothyroxine (SYNTHROID, LEVOTHROID) 100 MCG tablet Take 1 tablet (100 mcg total) by mouth daily. 90 tablet 3  . nortriptyline (PAMELOR) 50 MG capsule TAKE 2 CAPSULES (100 MG TOTAL) BY MOUTH AT BEDTIME. 180 capsule 1  . predniSONE (DELTASONE) 10 MG tablet 3 tabs by mouth per day for 3 days,2tabs per day for 3 days,1tab per day for 3 days 18 tablet 0   No current facility-administered medications on file prior to visit.     BP 124/70   Temp 98.2 F (36.8 C) (Oral)   Ht 5\' 4"  (1.626 m)   Wt 144 lb 12.8 oz (65.7 kg)   BMI 24.85 kg/m       Objective:   Physical Exam  Constitutional: She is oriented to person, place, and time. She appears well-developed and well-nourished. No distress.  Cardiovascular: Normal rate, regular rhythm, normal heart sounds and intact distal pulses.  Exam reveals no gallop and no friction rub.   No murmur heard. Pulmonary/Chest: Effort normal and breath sounds normal. No respiratory distress. She has no wheezes. She has no rales. She exhibits no tenderness.  Musculoskeletal: She exhibits tenderness (pain reproducable with palpation to sternum and upper back ).  Neurological: She is alert and oriented to person, place, and time.  Skin: Skin is warm and dry. No rash noted. She is not diaphoretic. No erythema. No pallor.  Psychiatric: She has a normal mood and affect. Her behavior is normal. Judgment and thought content normal.  Nursing note and vitals reviewed.     Assessment & Plan:  1. Costochondritis - HYDROcodone-acetaminophen (NORCO) 5-325 MG tablet; Take 1 tablet by mouth every 8 (eight) hours as needed for  moderate pain.  Dispense: 6 tablet; Refill: 0 - I do not feel as though she needs more antibiotics or chest x ray  - Follow up as needed - Work note given for two days   Shirline Freesory Keyuna Cuthrell, NP

## 2016-09-02 ENCOUNTER — Encounter: Payer: Self-pay | Admitting: Internal Medicine

## 2016-09-02 ENCOUNTER — Other Ambulatory Visit (INDEPENDENT_AMBULATORY_CARE_PROVIDER_SITE_OTHER): Payer: 59

## 2016-09-02 ENCOUNTER — Ambulatory Visit (INDEPENDENT_AMBULATORY_CARE_PROVIDER_SITE_OTHER)
Admission: RE | Admit: 2016-09-02 | Discharge: 2016-09-02 | Disposition: A | Discharge status: Home / Self Care | Payer: 59 | Source: Ambulatory Visit | Attending: Internal Medicine | Admitting: Internal Medicine

## 2016-09-02 ENCOUNTER — Other Ambulatory Visit: Payer: Self-pay | Admitting: Internal Medicine

## 2016-09-02 ENCOUNTER — Ambulatory Visit (INDEPENDENT_AMBULATORY_CARE_PROVIDER_SITE_OTHER): Payer: 59 | Admitting: Internal Medicine

## 2016-09-02 VITALS — BP 130/94 | HR 96 | Temp 98.2°F | Resp 16 | Wt 146.0 lb

## 2016-09-02 DIAGNOSIS — R0781 Pleurodynia: Secondary | ICD-10-CM

## 2016-09-02 DIAGNOSIS — R5383 Other fatigue: Secondary | ICD-10-CM | POA: Diagnosis not present

## 2016-09-02 DIAGNOSIS — E039 Hypothyroidism, unspecified: Secondary | ICD-10-CM | POA: Diagnosis not present

## 2016-09-02 DIAGNOSIS — R0602 Shortness of breath: Secondary | ICD-10-CM

## 2016-09-02 DIAGNOSIS — R748 Abnormal levels of other serum enzymes: Secondary | ICD-10-CM

## 2016-09-02 DIAGNOSIS — Z0289 Encounter for other administrative examinations: Secondary | ICD-10-CM

## 2016-09-02 LAB — COMPREHENSIVE METABOLIC PANEL
ALT: 15 U/L (ref 0–35)
AST: 19 U/L (ref 0–37)
Albumin: 4.3 g/dL (ref 3.5–5.2)
Alkaline Phosphatase: 148 U/L — ABNORMAL HIGH (ref 39–117)
BUN: 13 mg/dL (ref 6–23)
CO2: 28 mEq/L (ref 19–32)
Calcium: 9.8 mg/dL (ref 8.4–10.5)
Chloride: 101 mEq/L (ref 96–112)
Creatinine, Ser: 0.73 mg/dL (ref 0.40–1.20)
GFR: 88.17 mL/min (ref >60.00)
Glucose, Bld: 90 mg/dL (ref 70–99)
Potassium: 4.1 mEq/L (ref 3.5–5.1)
Sodium: 134 mEq/L — ABNORMAL LOW (ref 135–145)
Total Bilirubin: 0.3 mg/dL (ref 0.2–1.2)
Total Protein: 7.9 g/dL (ref 6.0–8.3)

## 2016-09-02 LAB — CBC WITH DIFFERENTIAL/PLATELET
Basophils Absolute: 0.1 10*3/uL (ref 0.0–0.1)
Basophils Relative: 0.7 % (ref 0.0–3.0)
Eosinophils Absolute: 0.2 10*3/uL (ref 0.0–0.7)
Eosinophils Relative: 1.7 % (ref 0.0–5.0)
HCT: 38.3 % (ref 36.0–46.0)
Hemoglobin: 12.6 g/dL (ref 12.0–15.0)
Lymphocytes Relative: 33.1 % (ref 12.0–46.0)
Lymphs Abs: 3.6 10*3/uL (ref 0.7–4.0)
MCHC: 33 g/dL (ref 30.0–36.0)
MCV: 90.2 fl (ref 78.0–100.0)
Monocytes Absolute: 0.9 10*3/uL (ref 0.1–1.0)
Monocytes Relative: 8.5 % (ref 3.0–12.0)
Neutro Abs: 6.2 10*3/uL (ref 1.4–7.7)
Neutrophils Relative %: 56 % (ref 43.0–77.0)
Platelets: 252 10*3/uL (ref 150.0–400.0)
RBC: 4.25 Mil/uL (ref 3.87–5.11)
RDW: 13.8 % (ref 11.5–15.5)
WBC: 11 10*3/uL — ABNORMAL HIGH (ref 4.0–10.5)

## 2016-09-02 LAB — TSH: TSH: 44.91 u[IU]/mL — ABNORMAL HIGH (ref 0.35–4.50)

## 2016-09-02 MED ORDER — LEVOTHYROXINE SODIUM 125 MCG PO TABS: 125.0000 ug | ORAL_TABLET | Freq: Every day | ORAL | 3 refills | Status: DC

## 2016-09-02 MED ORDER — MELOXICAM 15 MG PO TABS: 15.0000 mg | ORAL_TABLET | Freq: Every day | ORAL | 3 refills | Status: DC

## 2016-09-02 NOTE — Assessment & Plan Note (Signed)
Check tsh to evaluate fatigue Titrate med dose if needed

## 2016-09-02 NOTE — Patient Instructions (Addendum)
Have a chest xray and blood work today.  Test(s) ordered today. Your results will be released to MyChart (or called to you) after review, usually within 72hours after test completion. If any changes need to be made, you will be notified at that same time.  Medications reviewed and updated.  We will try a different anti-inflammatory for your pain - meloxicam once daily. Stop taking the advil.    Your prescription(s) have been submitted to your pharmacy. Please take as directed and contact our office if you believe you are having problem(s) with the medication(s).

## 2016-09-02 NOTE — Assessment & Plan Note (Signed)
Likely musculoskeletal in nature, possible costochondritis, pleuritis Unlikely rib contusion/fracture Will check cxr/rib xray meloxicam 15 mg daily - hold advil

## 2016-09-02 NOTE — Progress Notes (Signed)
Pre visit review using our clinic review tool, if applicable. No additional management support is needed unless otherwise documented below in the visit note. 

## 2016-09-02 NOTE — Assessment & Plan Note (Signed)
Likely related to musculoskeletal chest pain Less likely PNA or effusion Will check Cxr, rib xray

## 2016-09-02 NOTE — Assessment & Plan Note (Signed)
Check labs Likely due to not sleeping well and pain

## 2016-09-02 NOTE — Progress Notes (Signed)
Subjective:    Patient ID: Melissa Kirby, female    DOB: Dec 15, 1960, 56 y.o.   MRN: [id number]  HPI She is here for an acute visit.   08/20/16 - she was seen here for cold symptoms.  She was given a depo medrol 80 Im injection, predpac, inhaler and antibiotic.  The wheeze and cough got  better.  She was seen 08/27/16 for  SOB and chest discomfort that was worse with deep breaths.  She was diagnosed with costochondritis and prescribed norco.  The norco helped her sleep a little,but did not help with the pain.  She is taking advil now.    She she does not feel like she can get a deep breath and has SOB with exertion. She has left sided posterior rib pain.  The pain is constant.  It is worse with deep breaths.  She denies fever, cough, wheeze.    She taking ibuprofen 600 mg every 6 hours.  She has not been sleeping well and is tired.  She is concerned about the chest pain.  There has been no improvement since last week.   Medications and allergies reviewed with patient and updated if appropriate.  Patient Active Problem List   Diagnosis Date Noted  . Cough 08/20/2016  . Wheezing 08/20/2016  . LLQ pain 05/05/2016  . Intracranial hypotension 01/24/2014  . Vitamin D deficiency   . Abnormal brain MRI 10/15/2013  . LBP (low back pain) 05/10/2012  . Fibromyalgia   . Atrophic vaginitis 10/17/2010  . Depression 10/17/2010  . Vitamin B12 deficiency (non anemic) 10/04/2009  . Hypothyroidism 10/03/2009  . Migraine headache 10/03/2009    Current Outpatient Prescriptions on File Prior to Visit  Medication Sig Dispense Refill  . albuterol (PROVENTIL HFA;VENTOLIN HFA) 108 (90 Base) MCG/ACT inhaler Inhale 2 puffs into the lungs every 6 (six) hours as needed for wheezing or shortness of breath. 1 Inhaler 2  . HYDROcodone-acetaminophen (NORCO) 5-325 MG tablet Take 1 tablet by mouth every 8 (eight) hours as needed for moderate pain. 6 tablet 0  . levothyroxine (SYNTHROID, LEVOTHROID) 100 MCG tablet  Take 1 tablet (100 mcg total) by mouth daily. 90 tablet 3  . nortriptyline (PAMELOR) 50 MG capsule TAKE 2 CAPSULES (100 MG TOTAL) BY MOUTH AT BEDTIME. 180 capsule 1   No current facility-administered medications on file prior to visit.     Past Medical History:  Diagnosis Date  . ANEMIA, IRON DEFICIENCY   . Cerebrospinal fluid leak from spinal puncture    improved with 7[facility] patch [facility] 05/2014  . Depression    therapy  . Fibromyalgia 1999   no meds  . GASTRITIS 11/12/2009   Qualifier: Diagnosis of  By: Theadora Ramaellis RN, Silvio PateShelia    . GERD (gastroesophageal reflux disease)    per endo. - does not take any meds.    . Kidney stones 1998, 2005   x 2  . MIGRAINE HEADACHE    last Migraine 06/29/2011-   . Unspecified hypothyroidism 1982/2000   surgical resection of benign growth - partial then complete  . VITAMIN B12 DEFICIENCY 09/2009 dx    Past Surgical History:  Procedure Laterality Date  . CHOLECYSTECTOMY  2009  . DIAGNOSTIC LAPAROSCOPY  1982   diagnostic  . POSTERIOR FUSION LUMBAR SPINE  06/2011  . THYROID SURGERY  1983   partial   . TOTAL THYROIDECTOMY  2000   Had thyroid nodule, nonmalignant (4098-1191(1982-2000) resection  . TUBAL LIGATION  1989  Social History   Social History  . Marital status: Married    Spouse name: N/A  . Number of children: N/A  . Years of education: N/A   Social History Main Topics  . Smoking status: Never Smoker  . Smokeless tobacco: Never Used  . Alcohol use No  . Drug use: No  . Sexual activity: Yes    Birth control/ protection: Surgical     Comment: Married lives with spouse, 3 children. 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 None   Social History Narrative   Ongoing divorce from husband, separated since 05/2014. Lives alone     Works at [employer] and [employer].  Education: high school 12th grade.      Exercises regularly       Family History  Problem Relation Age of Onset  . Hyperlipidemia Mother   .  Heart disease Mother 62    massive MI  . Hyperlipidemia Father   . COPD Father     severe, smoker  . Breast cancer Maternal Grandmother   . Hyperlipidemia Maternal Grandmother   . Coronary artery disease Maternal Grandmother 78    CABG  . Ovarian cancer Other   . Anesthesia problems Neg Hx   . Malignant hyperthermia Neg Hx   . Pseudochol deficiency Neg Hx   . Hypotension Neg Hx     Review of Systems  Constitutional: Negative for fever.  HENT: Negative for congestion, ear pain and sore throat.   Respiratory: Positive for shortness of breath. Negative for cough, chest tightness and wheezing.   Cardiovascular: Positive for chest pain.       Objective:   Vitals:   09/02/16 1330  BP: (!) 130/94  Pulse: 96  Resp: 16  Temp: 98.2 F (36.8 C)   Filed Weights   09/02/16 1330  Weight: 146 lb (66.2 kg)   Body mass index is 25.06 kg/m.  Wt Readings from Last 3 Encounters:  09/02/16 146 lb (66.2 kg)  08/27/16 144 lb 12.8 oz (65.7 kg)  08/20/16 143 lb (64.9 kg)     Physical Exam Constitutional: Appears well-developed and well-nourished. No distress.  HENT:  Head: Normocephalic and atraumatic.  Neck: Neck supple. No tracheal deviation present. No thyromegaly present.  No cervical lymphadenopathy Cardiovascular: Normal rate, regular rhythm and normal heart sounds.   No murmur heard. No carotid bruit .  No edema Pulmonary/Chest: tenderness with palpation left posterior lower ribs - more severe pain is localized, but larger area of moderate pain with palpation, pain with deep breaths.  Effort normal and breath sounds normal. No respiratory distress. No has no wheezes. No rales.  Skin: Skin is warm and dry. Not diaphoretic.  Psychiatric: Normal mood and affect. Behavior is normal.       Assessment & Plan:   See Problem List for Assessment and Plan of chronic medical problems.

## 2016-09-17 ENCOUNTER — Telehealth: Payer: Self-pay | Admitting: Internal Medicine

## 2016-09-17 NOTE — Telephone Encounter (Signed)
Patient believes her medication is making her sick. She would like it changed. She has an appointment on March 19th that was the first thing I could get her in. Would you be wiling to see her at 4 one day? Please advise. I did explain to her that she may not get in sooner. She understood.

## 2016-09-17 NOTE — Telephone Encounter (Signed)
Which medication ? The meloxicam ?  If it is the meloxicam, which can cause stomach upset - she should stop the medication.

## 2016-09-17 NOTE — Telephone Encounter (Signed)
It takes 6 weeks for the medication to start working and her thyroid function to hopefully return to the normal range.   It will not help to check her thyroid function too early- we can not adjust her medication until after 6 weeks   Has she seen any improvement in the past two weeks.

## 2016-09-17 NOTE — Telephone Encounter (Signed)
Please advise 

## 2016-09-17 NOTE — Telephone Encounter (Signed)
Spoke with pt, she states that it is not a stomach ache, she is exhausted, and has no energy at all. States it is all she can do to get to work. She feels that it is her thyroid. She has only been on a different dose for two weeks, please advise.

## 2016-09-18 NOTE — Telephone Encounter (Signed)
LVM informing pt of MDs response.  

## 2016-10-06 ENCOUNTER — Ambulatory Visit: Payer: 59 | Admitting: Internal Medicine

## 2016-10-20 ENCOUNTER — Other Ambulatory Visit (INDEPENDENT_AMBULATORY_CARE_PROVIDER_SITE_OTHER): Payer: 59

## 2016-10-20 ENCOUNTER — Encounter: Payer: Self-pay | Admitting: Internal Medicine

## 2016-10-20 ENCOUNTER — Ambulatory Visit (INDEPENDENT_AMBULATORY_CARE_PROVIDER_SITE_OTHER): Payer: 59 | Admitting: Internal Medicine

## 2016-10-20 VITALS — BP 114/84 | HR 102 | Temp 98.9°F | Resp 16 | Ht 64.0 in | Wt 139.0 lb

## 2016-10-20 DIAGNOSIS — E039 Hypothyroidism, unspecified: Secondary | ICD-10-CM

## 2016-10-20 DIAGNOSIS — R030 Elevated blood-pressure reading, without diagnosis of hypertension: Secondary | ICD-10-CM

## 2016-10-20 LAB — T4, FREE: Free T4: 0.99 ng/dL (ref 0.60–1.60)

## 2016-10-20 LAB — TSH: TSH: 2.01 u[IU]/mL (ref 0.35–4.50)

## 2016-10-20 NOTE — Assessment & Plan Note (Signed)
BP elevated after lunch at work - well controlled the rest of the day Limit sodium intake Increase exercise Weight loss Continue to monitor No need for treatment at this time

## 2016-10-20 NOTE — Patient Instructions (Addendum)
  Test(s) ordered today. Your results will be released to MyChart (or called to you) after review, usually within 72hours after test completion. If any changes need to be made, you will be notified at that same time.   Medications reviewed and updated.  No changes recommended at this time.   Please followup in one year   

## 2016-10-20 NOTE — Progress Notes (Signed)
Subjective:    Patient ID: Melissa Kirby, female    DOB: 07-07-1961, 56 y.o.   MRN: [id number]  HPI The patient is here for follow up.  Hypothyroidism:  She is taking her medication daily.  Her energy level is better, but still not great.  Elevated blood pressure:  Her BP at work after lunch has been 146/89.  Other times of the day it is normal 120/70's.  Both her parents had heart disease, her grandparents had heart disease and her brothers have hypertension.  She is worried about her risk of heart disease.   Medications and allergies reviewed with patient and updated if appropriate.  Patient Active Problem List   Diagnosis Date Noted  . Rib pain on left side 09/02/2016  . SOB (shortness of breath) 09/02/2016  . Cough 08/20/2016  . LLQ pain 05/05/2016  . Intracranial hypotension 01/24/2014  . Vitamin D deficiency   . Abnormal brain MRI 10/15/2013  . LBP (low back pain) 05/10/2012  . Fibromyalgia   . Fatigue 01/17/2011  . Atrophic vaginitis 10/17/2010  . Depression 10/17/2010  . Vitamin B12 deficiency (non anemic) 10/04/2009  . Hypothyroidism 10/03/2009  . Migraine headache 10/03/2009    Current Outpatient Prescriptions on File Prior to Visit  Medication Sig Dispense Refill  . levothyroxine (SYNTHROID, LEVOTHROID) 125 MCG tablet Take 1 tablet (125 mcg total) by mouth daily. 90 tablet 3  . nortriptyline (PAMELOR) 50 MG capsule TAKE 2 CAPSULES (100 MG TOTAL) BY MOUTH AT BEDTIME. 180 capsule 1   No current facility-administered medications on file prior to visit.     Past Medical History:  Diagnosis Date  . ANEMIA, IRON DEFICIENCY   . Cerebrospinal fluid leak from spinal puncture    improved with 7th patch at [facility] 05/2014  . Depression    therapy  . Fibromyalgia 1999   no meds  . GASTRITIS 11/12/2009   Qualifier: Diagnosis of  By: Theadora Rama RN, Silvio Pate    . GERD (gastroesophageal reflux disease)    per endo. - does not take any meds.    . Kidney stones 1998, 2005   x  2  . MIGRAINE HEADACHE    last Migraine 06/29/2011-   . Unspecified hypothyroidism 1982/2000   surgical resection of benign growth - partial then complete  . VITAMIN B12 DEFICIENCY 09/2009 dx    Past Surgical History:  Procedure Laterality Date  . CHOLECYSTECTOMY  2009  . DIAGNOSTIC LAPAROSCOPY  1982   diagnostic  . POSTERIOR FUSION LUMBAR SPINE  06/2011  . THYROID SURGERY  1983   partial   . TOTAL THYROIDECTOMY  2000   Had thyroid nodule, nonmalignant (4098-1191) resection  . TUBAL LIGATION  1989    Social History   Social History  . Marital status: Married    Spouse name: N/A  . Number of children: N/A  . Years of education: N/A   Social History Main Topics  . Smoking status: Never Smoker  . Smokeless tobacco: Never Used  . Alcohol use No  . Drug use: No  . Sexual activity: Yes    Birth control/ protection: Surgical     Comment: Married lives with spouse, 3 children. 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 None   Social History Narrative   Ongoing divorce from husband, separated since 05/2014. Lives alone     Works at [employer] and [employer].  Education: high school 12th grade.      Exercises  regularly       Family History  Problem Relation Age of Onset  . Hyperlipidemia Mother   . Heart disease Mother 32    massive MI  . Hyperlipidemia Father   . COPD Father     severe, smoker  . Breast cancer Maternal Grandmother   . Hyperlipidemia Maternal Grandmother   . Coronary artery disease Maternal Grandmother 78    CABG  . Ovarian cancer Other   . Anesthesia problems Neg Hx   . Malignant hyperthermia Neg Hx   . Pseudochol deficiency Neg Hx   . Hypotension Neg Hx     Review of Systems  Constitutional: Negative for fever.  Respiratory: Negative for shortness of breath.   Cardiovascular: Negative for chest pain, palpitations and leg swelling.  Neurological: Positive for headaches (migraines). Negative for light-headedness.        Objective:   Vitals:   10/20/16 1511  BP: 114/84  Pulse: (!) 102  Resp: 16  Temp: 98.9 F (37.2 C)   Wt Readings from Last 3 Encounters:  10/20/16 139 lb (63 kg)  09/02/16 146 lb (66.2 kg)  08/27/16 144 lb 12.8 oz (65.7 kg)   Body mass index is 23.86 kg/m.   Physical Exam    Constitutional: Appears well-developed and well-nourished. No distress.  HENT:  Head: Normocephalic and atraumatic.  Neck: Neck supple. No tracheal deviation present. No thyromegaly present.  No cervical lymphadenopathy Cardiovascular: Normal rate, regular rhythm and normal heart sounds.   No murmur heard. No carotid bruit .  No edema Pulmonary/Chest: Effort normal and breath sounds normal. No respiratory distress. No has no wheezes. No rales.  Skin: Skin is warm and dry. Not diaphoretic.  Psychiatric: Normal mood and affect. Behavior is normal.      Assessment & Plan:    See Problem List for Assessment and Plan of chronic medical problems.

## 2016-10-20 NOTE — Assessment & Plan Note (Signed)
Check tsh  Titrate med dose if needed  

## 2016-10-20 NOTE — Progress Notes (Signed)
Pre visit review using our clinic review tool, if applicable. No additional management support is needed unless otherwise documented below in the visit note. 

## 2016-10-21 ENCOUNTER — Encounter: Payer: Self-pay | Admitting: Internal Medicine

## 2016-10-21 ENCOUNTER — Other Ambulatory Visit: Payer: Self-pay | Admitting: Internal Medicine

## 2016-10-21 DIAGNOSIS — E039 Hypothyroidism, unspecified: Secondary | ICD-10-CM

## 2016-11-05 ENCOUNTER — Encounter: Payer: Self-pay | Admitting: Internal Medicine

## 2016-11-05 DIAGNOSIS — E559 Vitamin D deficiency, unspecified: Secondary | ICD-10-CM

## 2016-11-05 DIAGNOSIS — E039 Hypothyroidism, unspecified: Secondary | ICD-10-CM

## 2016-11-05 DIAGNOSIS — E538 Deficiency of other specified B group vitamins: Secondary | ICD-10-CM

## 2016-11-05 NOTE — Telephone Encounter (Signed)
Please advise, last TSH was 10/20/16

## 2016-11-10 ENCOUNTER — Other Ambulatory Visit (INDEPENDENT_AMBULATORY_CARE_PROVIDER_SITE_OTHER): Payer: 59

## 2016-11-10 DIAGNOSIS — E039 Hypothyroidism, unspecified: Secondary | ICD-10-CM

## 2016-11-10 DIAGNOSIS — E538 Deficiency of other specified B group vitamins: Secondary | ICD-10-CM

## 2016-11-10 DIAGNOSIS — E559 Vitamin D deficiency, unspecified: Secondary | ICD-10-CM

## 2016-11-10 LAB — TSH: TSH: 1.26 u[IU]/mL (ref 0.35–4.50)

## 2016-11-10 LAB — VITAMIN B12: Vitamin B-12: 239 pg/mL (ref 211–911)

## 2016-11-10 LAB — VITAMIN D 25 HYDROXY (VIT D DEFICIENCY, FRACTURES): VITD: 26.11 ng/mL — ABNORMAL LOW (ref 30.00–100.00)

## 2016-11-11 ENCOUNTER — Encounter: Payer: Self-pay | Admitting: Internal Medicine

## 2016-12-02 ENCOUNTER — Encounter: Payer: Self-pay | Admitting: Internal Medicine

## 2016-12-30 ENCOUNTER — Other Ambulatory Visit: Payer: Self-pay | Admitting: Emergency Medicine

## 2016-12-30 MED ORDER — NORTRIPTYLINE HCL 50 MG PO CAPS: ORAL_CAPSULE | ORAL | 1 refills | Status: DC

## 2017-03-24 ENCOUNTER — Ambulatory Visit (INDEPENDENT_AMBULATORY_CARE_PROVIDER_SITE_OTHER): Payer: 59 | Admitting: Family Medicine

## 2017-03-24 ENCOUNTER — Encounter: Payer: Self-pay | Admitting: Family Medicine

## 2017-03-24 VITALS — BP 118/82 | HR 114 | Temp 98.4°F | Ht 64.0 in | Wt 144.0 lb

## 2017-03-24 DIAGNOSIS — M797 Fibromyalgia: Secondary | ICD-10-CM | POA: Diagnosis not present

## 2017-03-24 DIAGNOSIS — G44209 Tension-type headache, unspecified, not intractable: Secondary | ICD-10-CM | POA: Insufficient documentation

## 2017-03-24 MED ORDER — GABAPENTIN 100 MG PO CAPS: 100.0000 mg | ORAL_CAPSULE | Freq: Three times a day (TID) | ORAL | 1 refills | Status: DC

## 2017-03-24 NOTE — Progress Notes (Signed)
Melissa Kirby - 56 y.o. female MRN [id number]  Date of birth: 01-Jun-1961  SUBJECTIVE:  Including CC & ROS.  Chief Complaint  Patient presents with  . Headache    patient ststes that she has had a headache for a couple of day. states she is prone to migraines and this is not that. claims that yesterday her whole body started to ache that if she had a fever or a cough she would swear she has the flu    Melissa Kirby is a 56 yo F that is presenting with body aches and headache. Pain has been present for three days. She denies any specific trigger to her pain. She reports the pain and mainly in her joints. Describes is stopping in nature. Has tried ibuprofen with no improvement. Does not seem to have any fevers or chills. Denies any rash. Denies any more stress than usual. Pain seems to be localized in each spot. Does have history of viral myalgia but does not take any medication.   Review of blood work from 09/02/16 shows mild hyponatremia but normal kidney function. B-12 from 11/10/16 shows normal and TSH is normal with mild vitamin D deficiency.  Review of Systems  Constitutional: Negative for fever.  Respiratory: Negative for cough.   Cardiovascular: Negative for leg swelling.  Musculoskeletal: Positive for arthralgias. Negative for back pain and gait problem.  Skin: Negative for color change.  Neurological: Negative for weakness and numbness.  Hematological: Negative for adenopathy.   otherwise negative  HISTORY: Past Medical, Surgical, Social, and Family History Reviewed & Updated per EMR.   Pertinent Historical Findings include:  Past Medical History:  Diagnosis Date  . ANEMIA, IRON DEFICIENCY   . Cerebrospinal fluid leak from spinal puncture    improved with 7th patch at [facility] 05/2014  . Depression    therapy  . Fibromyalgia 1999   no meds  . GASTRITIS 11/12/2009   Qualifier: Diagnosis of  By: Dicie Beam RN, Adela Lank    . GERD (gastroesophageal reflux disease)    per endo. - does not take  any meds.    . Kidney stones 1998, 2005   x 2  . MIGRAINE HEADACHE    last Migraine 06/29/2011-   . Unspecified hypothyroidism 1982/2000   surgical resection of benign growth - partial then complete  . VITAMIN B12 DEFICIENCY 09/2009 dx    Past Surgical History:  Procedure Laterality Date  . CHOLECYSTECTOMY  2009  . DIAGNOSTIC LAPAROSCOPY  1982   diagnostic  . POSTERIOR FUSION LUMBAR SPINE  06/2011  . THYROID SURGERY  1983   partial   . TOTAL THYROIDECTOMY  2000   Had thyroid nodule, nonmalignant ([id number]) resection  . TUBAL LIGATION  1989    Allergies  Allergen Reactions  . Compazine Anaphylaxis    Hypotension  . Metoclopramide Hcl Anaphylaxis    Hypotension  . Prochlorperazine Edisylate     Other reaction(s): Other (See Comments) Hemodynamic instability  . Gluten Meal Other (See Comments)    Vitamin B irregulaties  . Morphine And Related Nausea Only    Family History  Problem Relation Age of Onset  . Hyperlipidemia Mother   . Heart disease Mother 5       massive MI  . Hyperlipidemia Father   . COPD Father        severe, smoker  . Breast cancer Maternal Grandmother   . Hyperlipidemia Maternal Grandmother   . Coronary artery disease Maternal Grandmother 78  CABG  . Ovarian cancer Other   . Anesthesia problems Neg Hx   . Malignant hyperthermia Neg Hx   . Pseudochol deficiency Neg Hx   . Hypotension Neg Hx      Social History   Social History  . Marital status: Married    Spouse name: N/A  . Number of children: N/A  . Years of education: N/A   Occupational History  . Not on file.   Social History Main Topics  . Smoking status: Never Smoker  . Smokeless tobacco: Never Used  . Alcohol use No  . Drug use: No  . Sexual activity: Yes    Birth control/ protection: Surgical     Comment: Married lives with spouse, 3 children. 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 Not on file   Social History Narrative    Ongoing divorce from husband, separated since 05/2014. Lives alone     Works at [employer] and [employer].  Education: high school 12th grade.      Exercises regularly        PHYSICAL EXAM:  VS: BP 118/82 (BP Location: Left Arm, Patient Position: Sitting, Cuff Size: Normal)   Pulse (!) 114   Temp 98.4 F (36.9 C) (Oral)   Ht '5\' 4"'  (1.626 m)   Wt 144 lb (65.3 kg)   SpO2 99%   BMI 24.72 kg/m  Physical Exam Gen: NAD, alert, cooperative with exam, well-appearing ENT: normal lips, normal nasal mucosa,  Eye: normal EOM, normal conjunctiva and lids CV:  no edema, +2 pedal pulses, S1-S2,   Resp: no accessory muscle use, non-labored, clear to auscultation bilaterally, no crackles or wheezes Skin: no rashes, no areas of induration  Neuro: normal tone, normal sensation to touch, cranial nerves II through XII intact Psych:  normal insight, alert and oriented MSK:  Tender to palpation at the base of her school, trapezius, and rhomboids. Tenderness to palpation in the elbows and her wrist and fingers. Normal range of motion upper extremities and normal strength resistance. No obvious swelling or redness of any of the joints. Normal hip internal and external rotation. Normal knee flexion and extension. Normal strength in lower extremities. Normal plantar dorsiflexion. Normal gait. Neurovascular intact.    ASSESSMENT & PLAN:   Fibromyalgia Pain seems to be consistent with fibromyalgia. She does not appear to have any infection. She is tachycardic today but her last TSH was within normal limits. - Initiate gabapentin - If symptoms do not resolve then we will check a TSH as well as a CRP, ESR and could consider other rheum workup.  Acute non intractable tension-type headache Pain seems be related to a tension type headache versus exacerbation of her fibromyalgia. She does have a history of intracranial hypertension but has had this corrected. No deficits on exam today. - Can continue  ibuprofen - Given indications to return.

## 2017-03-24 NOTE — Patient Instructions (Addendum)
Thank you for coming in,   Please start the gabapentin at 100 mg at night and then you can increase to three time per day as you tolerate.   Exercise will help with the pain.   Please follow up if your symptoms do not improve.    Please feel free to call with any questions or concerns at any time, at [phone]. --Dr. Jordan LikesSchmitz

## 2017-03-24 NOTE — Assessment & Plan Note (Addendum)
Pain seems be related to a tension type headache versus exacerbation of her fibromyalgia. She does have a history of intracranial hypertension but has had this corrected. No deficits on exam today. - Can continue ibuprofen - Given indications to return.

## 2017-03-24 NOTE — Assessment & Plan Note (Signed)
Pain seems to be consistent with fibromyalgia. She does not appear to have any infection. She is tachycardic today but her last TSH was within normal limits. - Initiate gabapentin - If symptoms do not resolve then we will check a TSH as well as a CRP, ESR and could consider other rheum workup.

## 2017-03-25 ENCOUNTER — Telehealth: Payer: Self-pay | Admitting: Family Medicine

## 2017-03-25 ENCOUNTER — Telehealth: Payer: Self-pay | Admitting: Internal Medicine

## 2017-03-25 NOTE — Telephone Encounter (Signed)
Pt called stating she cannot take gabapentin (NEURONTIN) 100 MG capsule  It is causing her to be very nauseous. She also stated she has a temperature of 102 and has chills, I transferred her to [facility].  Please advise can something else be sent in  For her?

## 2017-03-25 NOTE — Telephone Encounter (Signed)
[facility] - Client TELEPHONE ADVICE RECORD [facility] Patient Name: Melissa Kirby DOB: [birth year] Initial Comment Caller was in the office yesterday, on a medication that caller can't take due to be nauseas, now has a fever of 102, chills. Nurse Assessment Nurse: Ladona RidgelGaddy, RN, Felicia Date/Time (Eastern Time): 03/25/2017 4:07:56 PM Confirm and document reason for call. If symptomatic, describe symptoms. ---PT saw MD yesterday and he gave her gabapentin and she cant take it bc it makes her very nauseated. When she came she was achy all over but no cough. He gave it bc she has fibromyalgia. Now she has a fever onset today. This is only symptom other than achiness. 102 orally. Still no cough, no congestion. Does the patient have any new or worsening symptoms? ---Yes Will a triage be completed? ---Yes Related visit to physician within the last 2 weeks? ---Marlou SaYesDoes the PT have any chronic conditions? (i.e. diabetes, asthma, etc.) ---Yes List chronic conditions. ---fibromyalgia, asthma when she gets sick, hashamotos d. Is the patient pregnant or possibly pregnant? (Ask all females between the ages of 4212-55) ---No Is this a behavioral health or substance abuse call? ---No Guidelines Guideline Title Affirmed Question Affirmed Notes Fever [1] Fever > 100.0 F (37.8 C) AND [2] diabetes mellitus or weak immune system (e.g., HIV positive, cancer chemo, splenectomy, chronic steroids) Final Disposition User See Physician within 4 Hours (or PCP triage) Ladona RidgelGaddy, RN, Felicia Comments mild HA for a few dHA 1st, then body aches came 102 oral now Referrals GO TO FACILITY OTHER - SPECIFY Disagree/Comply: Comply Call Id: [phone]

## 2017-03-27 ENCOUNTER — Encounter: Payer: Self-pay | Admitting: Family Medicine

## 2017-03-27 NOTE — Telephone Encounter (Signed)
Left VM for patient. If she calls back please have her speak with a nurse/CMA and let her know that she can be seen if she is still febrile.   If any questions then please take the best time and phone number to call and I will try to call her back.   Myra RudeSchmitz, Jeremy E, MD [facility] 03/27/2017, 8:55 AM

## 2017-03-29 ENCOUNTER — Encounter (HOSPITAL_COMMUNITY): Payer: Self-pay | Admitting: Emergency Medicine

## 2017-03-29 ENCOUNTER — Emergency Department (HOSPITAL_COMMUNITY): Payer: 59

## 2017-03-29 ENCOUNTER — Emergency Department (HOSPITAL_COMMUNITY)
Admission: EM | Admit: 2017-03-29 | Discharge: 2017-03-29 | Disposition: A | Discharge status: Home / Self Care | Payer: 59 | Attending: Emergency Medicine | Admitting: Emergency Medicine

## 2017-03-29 DIAGNOSIS — R740 Nonspecific elevation of levels of transaminase and lactic acid dehydrogenase [LDH]: Secondary | ICD-10-CM | POA: Diagnosis not present

## 2017-03-29 DIAGNOSIS — R7989 Other specified abnormal findings of blood chemistry: Secondary | ICD-10-CM | POA: Diagnosis not present

## 2017-03-29 DIAGNOSIS — R072 Precordial pain: Secondary | ICD-10-CM | POA: Insufficient documentation

## 2017-03-29 DIAGNOSIS — R7401 Elevation of levels of liver transaminase levels: Secondary | ICD-10-CM

## 2017-03-29 DIAGNOSIS — E039 Hypothyroidism, unspecified: Secondary | ICD-10-CM | POA: Diagnosis not present

## 2017-03-29 DIAGNOSIS — Z79899 Other long term (current) drug therapy: Secondary | ICD-10-CM | POA: Diagnosis not present

## 2017-03-29 DIAGNOSIS — R079 Chest pain, unspecified: Secondary | ICD-10-CM | POA: Diagnosis not present

## 2017-03-29 LAB — CBC WITH DIFFERENTIAL/PLATELET
Basophils Absolute: 0.2 10*3/uL — ABNORMAL HIGH (ref 0.0–0.1)
Basophils Relative: 3 %
Eosinophils Absolute: 0.3 10*3/uL (ref 0.0–0.7)
Eosinophils Relative: 3 %
HCT: 37.6 % (ref 36.0–46.0)
Hemoglobin: 12.3 g/dL (ref 12.0–15.0)
Lymphocytes Relative: 50 %
Lymphs Abs: 4 10*3/uL (ref 0.7–4.0)
MCH: 29.3 pg (ref 26.0–34.0)
MCHC: 32.7 g/dL (ref 30.0–36.0)
MCV: 89.5 fL (ref 78.0–100.0)
Monocytes Absolute: 0.5 10*3/uL (ref 0.1–1.0)
Monocytes Relative: 7 %
Neutro Abs: 3 10*3/uL (ref 1.7–7.7)
Neutrophils Relative %: 38 %
Platelets: 206 10*3/uL (ref 150–400)
RBC: 4.2 MIL/uL (ref 3.87–5.11)
RDW: 13.5 % (ref 11.5–15.5)
WBC: 8 10*3/uL (ref 4.0–10.5)

## 2017-03-29 LAB — COMPREHENSIVE METABOLIC PANEL
ALT: 253 U/L — ABNORMAL HIGH (ref 14–54)
AST: 329 U/L — ABNORMAL HIGH (ref 15–41)
Albumin: 3.8 g/dL (ref 3.5–5.0)
Alkaline Phosphatase: 484 U/L — ABNORMAL HIGH (ref 38–126)
Anion gap: 12 (ref 5–15)
BUN: 8 mg/dL (ref 6–20)
CO2: 20 mmol/L — ABNORMAL LOW (ref 22–32)
Calcium: 9.4 mg/dL (ref 8.9–10.3)
Chloride: 104 mmol/L (ref 101–111)
Creatinine, Ser: 0.68 mg/dL (ref 0.44–1.00)
GFR calc Af Amer: >60 mL/min (ref >60)
GFR calc non Af Amer: >60 mL/min (ref >60)
Glucose, Bld: 97 mg/dL (ref 65–99)
Potassium: 3.8 mmol/L (ref 3.5–5.1)
Sodium: 136 mmol/L (ref 135–145)
Total Bilirubin: 0.7 mg/dL (ref 0.3–1.2)
Total Protein: 7.5 g/dL (ref 6.5–8.1)

## 2017-03-29 LAB — TROPONIN I: Troponin I: <0.03 ng/mL (ref <0.03)

## 2017-03-29 LAB — D-DIMER, QUANTITATIVE: D-Dimer, Quant: 3.13 ug/mL-FEU — ABNORMAL HIGH (ref 0.00–0.50)

## 2017-03-29 MED ORDER — ONDANSETRON HCL 4 MG/2ML IJ SOLN
4.0000 mg | Freq: Once | INTRAMUSCULAR | Status: AC
  Administered 2017-03-29: 4 mg via INTRAVENOUS
  Filled 2017-03-29: qty 2

## 2017-03-29 MED ORDER — IBUPROFEN 600 MG PO TABS: 600.0000 mg | ORAL_TABLET | Freq: Four times a day (QID) | ORAL | 0 refills | Status: DC | PRN

## 2017-03-29 MED ORDER — FENTANYL CITRATE (PF) 100 MCG/2ML IJ SOLN
100.0000 ug | Freq: Once | INTRAMUSCULAR | Status: AC
  Administered 2017-03-29: 100 ug via INTRAVENOUS
  Filled 2017-03-29: qty 2

## 2017-03-29 MED ORDER — SODIUM CHLORIDE 0.9 % IV BOLUS (SEPSIS)
1000.0000 mL | Freq: Once | INTRAVENOUS | Status: AC
  Administered 2017-03-29: 1000 mL via INTRAVENOUS

## 2017-03-29 MED ORDER — DOXYCYCLINE HYCLATE 100 MG PO CAPS: 100.0000 mg | ORAL_CAPSULE | Freq: Two times a day (BID) | ORAL | 0 refills | Status: DC

## 2017-03-29 MED ORDER — DOXYCYCLINE HYCLATE 100 MG PO TABS
100.0000 mg | ORAL_TABLET | Freq: Once | ORAL | Status: AC
  Administered 2017-03-29: 100 mg via ORAL
  Filled 2017-03-29: qty 1

## 2017-03-29 MED ORDER — ONDANSETRON 4 MG PO TBDP: 4.0000 mg | ORAL_TABLET | Freq: Three times a day (TID) | ORAL | 0 refills | Status: DC | PRN

## 2017-03-29 MED ORDER — IOPAMIDOL (ISOVUE-370) INJECTION 76%
100.0000 mL | Freq: Once | INTRAVENOUS | Status: AC | PRN
  Administered 2017-03-29: 100 mL via INTRAVENOUS

## 2017-03-29 NOTE — Discharge Instructions (Signed)
Your testing today revealed that your liver tests were high, this can be from multiple different causes including liver infections, inflammatory processes.  Your x-ray did not show definite pneumonia is however your CT scan suggested that there may be an early pulmonary infection. Please take doxycycline 100 mg twice a day for the next 10 days.  Your blood tests regarding her liver and hepatitis testing will be back within the next 1-2 days, please have your family Dr. Follow-up these results, call the office in the morning for an appointment. She should be seen within the next 2 days.  If any point you should develop any of the following symptoms she should return to the emergency department  Chest pain Shortness of breath Worsening fevers Uncontrolled vomiting  For chest pain please take ibuprofen, for nausea please take Zofran.

## 2017-03-29 NOTE — ED Provider Notes (Signed)
[facility]-EMERGENCY DEPT Provider Note   CSN: [id number] Arrival date & time: 03/29/17  1454     History   Chief Complaint Chief Complaint  Patient presents with  . Chest Pain    HPI Melissa Kirby is a 56 y.o. female.  HPI  The patient is a 56 year old female, history of migraine headaches, idiopathic intracranial hypertension, also reports that she has fibromyalgia and has Hashimoto's thyroiditis status post thyroidectomy on Synthroid replacement. The patient reports that approximately 5 or 6 days ago she was seen at her family doctor's office, she was having headaches and body aches, she was told this is probably fibromyalgia and given Neurontin, this did not help and in fact made her nauseated. She then over the next several days became febrile to 102. She took medications to help with the fever but continued to feel poorly and over the last 24 hours presents with increasing amounts of chest pain which is midsternal, feels like her chest is being torn open, there is no radiation to the shoulders and neck or the back. She has no swelling of the legs cancer surgery tobacco use or hormone use. She has no other risk factors for pulmonary embolus. She went to the urgent care where they did a chest x-ray and an EKG, the chest x-ray was read as negative, the EKG was unremarkable and she was sent to the emergency department for further evaluation. She does not have a fever today, she denies any headaches, stiff neck, rashes, tick bites, sore throat and in fact she has no coughing or shortness of breath.  Past Medical History:  Diagnosis Date  . ANEMIA, IRON DEFICIENCY   . Cerebrospinal fluid leak from spinal puncture    improved with 7th patch at [facility] 05/2014  . Depression    therapy  . Fibromyalgia 1999   no meds  . GASTRITIS 11/12/2009   Qualifier: Diagnosis of  By: Theadora Rama RN, Silvio Pate    . GERD (gastroesophageal reflux disease)    per endo. - does not take any meds.    . Kidney stones 1998,  2005   x 2  . MIGRAINE HEADACHE    last Migraine 06/29/2011-   . Unspecified hypothyroidism 1982/2000   surgical resection of benign growth - partial then complete  . VITAMIN B12 DEFICIENCY 09/2009 dx    Patient Active Problem List   Diagnosis Date Noted  . Acute non intractable tension-type headache 03/24/2017  . Rib pain on left side 09/02/2016  . SOB (shortness of breath) 09/02/2016  . Cough 08/20/2016  . LLQ pain 05/05/2016  . Intracranial hypotension 01/24/2014  . Vitamin D deficiency   . Abnormal brain MRI 10/15/2013  . Elevated blood pressure reading 10/13/2013  . LBP (low back pain) 05/10/2012  . Fibromyalgia   . Fatigue 01/17/2011  . Atrophic vaginitis 10/17/2010  . Depression 10/17/2010  . Vitamin B12 deficiency (non anemic) 10/04/2009  . Hypothyroidism 10/03/2009  . Migraine headache 10/03/2009    Past Surgical History:  Procedure Laterality Date  . CHOLECYSTECTOMY  2009  . DIAGNOSTIC LAPAROSCOPY  1982   diagnostic  . POSTERIOR FUSION LUMBAR SPINE  06/2011  . THYROID SURGERY  1983   partial   . TOTAL THYROIDECTOMY  2000   Had thyroid nodule, nonmalignant ([id number]) resection  . TUBAL LIGATION  1989    OB History    No data available       Home Medications    Prior to Admission medications   Medication Sig Start  Date End Date Taking? Authorizing Provider  doxycycline (VIBRAMYCIN) 100 MG capsule Take 1 capsule (100 mg total) by mouth 2 (two) times daily. 03/29/17   Eber HongMiller, Aarian Cleaver, MD  gabapentin (NEURONTIN) 100 MG capsule Take 1 capsule (100 mg total) by mouth 3 (three) times daily. 03/24/17   Myra RudeSchmitz, Jeremy E, MD  ibuprofen (ADVIL,MOTRIN) 600 MG tablet Take 1 tablet (600 mg total) by mouth every 6 (six) hours as needed. 03/29/17   Eber HongMiller, Kaleel Schmieder, MD  levothyroxine (SYNTHROID, LEVOTHROID) 125 MCG tablet Take 1 tablet (125 mcg total) by mouth daily. 09/02/16   Pincus SanesBurns, Stacy J, MD  nortriptyline (PAMELOR) 50 MG capsule TAKE 2 CAPSULES (100 MG TOTAL) BY MOUTH AT  BEDTIME. 12/30/16   Burns, Bobette MoStacy J, MD  ondansetron (ZOFRAN ODT) 4 MG disintegrating tablet Take 1 tablet (4 mg total) by mouth every 8 (eight) hours as needed for nausea. 03/29/17   Eber HongMiller, Axel Meas, MD    Family History Family History  Problem Relation Age of Onset  . Hyperlipidemia Mother   . Heart disease Mother 6664       massive MI  . Hyperlipidemia Father   . COPD Father        severe, smoker  . Breast cancer Maternal Grandmother   . Hyperlipidemia Maternal Grandmother   . Coronary artery disease Maternal Grandmother 78       CABG  . Ovarian cancer Other   . Anesthesia problems Neg Hx   . Malignant hyperthermia Neg Hx   . Pseudochol deficiency Neg Hx   . Hypotension Neg Hx     Social History Social History  Substance Use Topics  . Smoking status: Never Smoker  . Smokeless tobacco: Never Used  . Alcohol use No     Allergies   Compazine; Metoclopramide hcl; Prochlorperazine edisylate; Gluten meal; and Morphine and related   Review of Systems Review of Systems  All other systems reviewed and are negative.    Physical Exam Updated Vital Signs BP 131/89   Pulse (!) 105   Temp 98.4 F (36.9 C) (Oral)   Resp 20   Ht 5\' 4"  (1.626 m)   Wt 66.2 kg (146 lb)   SpO2 100%   BMI 25.06 kg/m   Physical Exam  Constitutional: She appears well-developed and well-nourished. No distress.  HENT:  Head: Normocephalic and atraumatic.  Mouth/Throat: Oropharynx is clear and moist. No oropharyngeal exudate.  Eyes: Pupils are equal, round, and reactive to light. Conjunctivae and EOM are normal. Right eye exhibits no discharge. Left eye exhibits no discharge. No scleral icterus.  Neck: Normal range of motion. Neck supple. No JVD present. No thyromegaly present.  Cardiovascular: Regular rhythm, normal heart sounds and intact distal pulses.  Exam reveals no gallop and no friction rub.   No murmur heard. Mild tachycardia present  Pulmonary/Chest: Effort normal and breath sounds  normal. No respiratory distress. She has no wheezes. She has no rales. She exhibits no tenderness ( there is no tenderness over the chest wall or either breast, chaperone present for exam, no rashes over the chest wall.).  Abdominal: Soft. Bowel sounds are normal. She exhibits no distension and no mass. There is no tenderness.  No epigastric tenderness, no distention  Musculoskeletal: Normal range of motion. She exhibits no edema or tenderness.  Lymphadenopathy:    She has no cervical adenopathy.  Neurological: She is alert. Coordination normal.  Skin: Skin is warm and dry. No rash noted. No erythema.  Psychiatric: She has a normal mood and  affect. Her behavior is normal.  Nursing note and vitals reviewed.    ED Treatments / Results  Labs (all labs ordered are listed, but only abnormal results are displayed) Labs Reviewed  COMPREHENSIVE METABOLIC PANEL - Abnormal; Notable for the following:       Result Value   CO2 20 (*)    AST 329 (*)    ALT 253 (*)    Alkaline Phosphatase 484 (*)    All other components within normal limits  CBC WITH DIFFERENTIAL/PLATELET - Abnormal; Notable for the following:    Basophils Absolute 0.2 (*)    All other components within normal limits  D-DIMER, QUANTITATIVE (NOT AT [facility]) - Abnormal; Notable for the following:    D-Dimer, Quant 3.13 (*)    All other components within normal limits  TROPONIN I  HEPATITIS PANEL, ACUTE    EKG  EKG Interpretation  Date/Time:  Sunday March 29 2017 14:56:52 EDT Ventricular Rate:  110 PR Interval:    QRS Duration: 79 QT Interval:  336 QTC Calculation: 451 R Axis:   33 Text Interpretation:  Sinus tachycardia Since last tracing rate slower Confirmed by Eber Hong ([id number]) on 03/29/2017 3:21:14 PM       Radiology Ct Angio Chest Pe W And/or Wo Contrast  Result Date: 03/29/2017 CLINICAL DATA:  Chest pain.  Elevated D-dimer. EXAM: CT ANGIOGRAPHY CHEST WITH CONTRAST TECHNIQUE: Multidetector CT imaging of the  chest was performed using the standard protocol during bolus administration of intravenous contrast. Multiplanar CT image reconstructions and MIPs were obtained to evaluate the vascular anatomy. CONTRAST:  100 mL Isovue 370 COMPARISON:  Chest radiographs earlier today.  Chest CTA 10/04/2009. FINDINGS: Cardiovascular: Pulmonary arterial opacification is adequate without evidence of emboli. There is no evidence of thoracic aortic dissection or aneurysm. The heart is borderline enlarged. There is no pericardial effusion. Mediastinum/Nodes: No enlarged axillary, mediastinal, or hilar lymph nodes. Unremarkable esophagus. Lungs/Pleura: No pleural effusion or pneumothorax. Evaluation of the lung parenchyma is limited by respiratory motion artifact. There is bandlike opacity in the right lower lobe with patchy dependent subpleural opacity in both lower lobes. Minimal dependent opacity is present in the upper lobes and lingula. The lungs were clear on the chest radiographs earlier today which were obtained with a greater degree of lung inflation, and current findings are favored to reflect atelectasis. Upper Abdomen: Prior cholecystectomy. Musculoskeletal: No acute osseous abnormality or suspicious osseous lesion. Review of the MIP images confirms the above findings. IMPRESSION: 1. No evidence of pulmonary emboli. 2. Respiratory motion artifact with predominantly dependent opacities in both lungs favored to reflect atelectasis. Electronically Signed   By: Sebastian Ache M.D.   On: 03/29/2017 17:38    Procedures Procedures (including critical care time)  Medications Ordered in ED Medications  doxycycline (VIBRA-TABS) tablet 100 mg (not administered)  fentaNYL (SUBLIMAZE) injection 100 mcg (100 mcg Intravenous Given 03/29/17 1534)  ondansetron (ZOFRAN) injection 4 mg (4 mg Intravenous Given 03/29/17 1534)  sodium chloride 0.9 % bolus 1,000 mL (0 mLs Intravenous Stopped 03/29/17 1715)  iopamidol (ISOVUE-370) 76 % injection  100 mL (100 mLs Intravenous Contrast Given 03/29/17 1652)     Initial Impression / Assessment and Plan / ED Course  I have reviewed the triage vital signs and the nursing notes.  Pertinent labs & imaging results that were available during my care of the patient were reviewed by me and considered in my medical decision making (see chart for details).     The patient does not  have any specific findings other than tachycardia, with her fever and chest pain there is some concern for myocarditis though she is normotensive she is tachycardic. She will need a troponin as well as a d-dimer to look for pulmonary embolism as she does not meet perc criteria. This does not seem to be exertional or positional chest pain.  No signs of pulmonary embolism or pneumonia on the x-ray. No leukocytosis, no renal dysfunction. She does have some elevated liver function, she was informed of this, acute hepatitis panel was ordered. She is artery had a cholecystectomy.  I do not see any signs of hemodynamic compromise or elevated troponin to suggest that she has myocarditis. EKG was nonischemic, we'll start on doxycycline, anti-inflammatories and Zofran as needed for nausea. Patient is agreeable.  She has pulse of 102 every time I go in the room.  Not concerning as it is at the borderline for abnormal but does not seem to be assocaited with pathological findings otherwise.  Pt informed of all her results Will f/u in next 2 days ER if worsened - informed of indications for return, expressed her understanding.  Vitals:   03/29/17 1500 03/29/17 1600 03/29/17 1630 03/29/17 1754  BP: 134/88 123/86 131/89   Pulse: (!) 108 (!) 103 (!) 105   Resp: (!) Temp: 98.4 F (36.9 C)   98.4 F (36.9 C)  TempSrc: Oral   Oral  SpO2: 100% 99% 100%   Weight:      Height:         Final Clinical Impressions(s) / ED Diagnoses   Final diagnoses:  Precordial pain  Transaminitis    New Prescriptions New Prescriptions    DOXYCYCLINE (VIBRAMYCIN) 100 MG CAPSULE    Take 1 capsule (100 mg total) by mouth 2 (two) times daily.   IBUPROFEN (ADVIL,MOTRIN) 600 MG TABLET    Take 1 tablet (600 mg total) by mouth every 6 (six) hours as needed.   ONDANSETRON (ZOFRAN ODT) 4 MG DISINTEGRATING TABLET    Take 1 tablet (4 mg total) by mouth every 8 (eight) hours as needed for nausea.     Eber Hong, MD 03/29/17 [phone]

## 2017-03-29 NOTE — ED Notes (Signed)
Pt returned from CT at this time.  

## 2017-03-29 NOTE — ED Triage Notes (Signed)
Pt states she began having a headache last Saturday and then on Tuesday developed body aches and fatigue.  Friday began running a temp of 102.  Pt c/o chest pain in center of chest straight through to back.  [facility]

## 2017-03-30 ENCOUNTER — Encounter: Payer: Self-pay | Admitting: Family Medicine

## 2017-03-30 ENCOUNTER — Ambulatory Visit (INDEPENDENT_AMBULATORY_CARE_PROVIDER_SITE_OTHER): Payer: 59 | Admitting: Family Medicine

## 2017-03-30 ENCOUNTER — Other Ambulatory Visit (HOSPITAL_COMMUNITY)
Admission: RE | Admit: 2017-03-30 | Discharge: 2017-03-30 | Disposition: A | Discharge status: Home / Self Care | Payer: 59 | Source: Ambulatory Visit | Attending: Family Medicine | Admitting: Family Medicine

## 2017-03-30 ENCOUNTER — Other Ambulatory Visit (INDEPENDENT_AMBULATORY_CARE_PROVIDER_SITE_OTHER): Payer: 59

## 2017-03-30 VITALS — BP 128/84 | HR 111 | Temp 99.7°F | Ht 64.0 in | Wt 146.0 lb

## 2017-03-30 DIAGNOSIS — R079 Chest pain, unspecified: Secondary | ICD-10-CM | POA: Diagnosis not present

## 2017-03-30 DIAGNOSIS — R5381 Other malaise: Secondary | ICD-10-CM

## 2017-03-30 DIAGNOSIS — R509 Fever, unspecified: Secondary | ICD-10-CM

## 2017-03-30 DIAGNOSIS — Z124 Encounter for screening for malignant neoplasm of cervix: Secondary | ICD-10-CM

## 2017-03-30 DIAGNOSIS — R748 Abnormal levels of other serum enzymes: Secondary | ICD-10-CM

## 2017-03-30 DIAGNOSIS — N870 Mild cervical dysplasia: Secondary | ICD-10-CM | POA: Diagnosis not present

## 2017-03-30 LAB — URINALYSIS
Bilirubin Urine: NEGATIVE
Hgb urine dipstick: NEGATIVE
Ketones, ur: NEGATIVE
Leukocytes, UA: NEGATIVE
Nitrite: NEGATIVE
Specific Gravity, Urine: 1.02 (ref 1.000–1.030)
Total Protein, Urine: NEGATIVE
Urine Glucose: NEGATIVE
Urobilinogen, UA: 0.2 (ref 0.0–1.0)
pH: 8 (ref 5.0–8.0)

## 2017-03-30 LAB — WET PREP, GENITAL
Trich, Wet Prep: NONE SEEN — AB
Yeast Wet Prep HPF POC: NONE SEEN — AB

## 2017-03-30 LAB — LIPASE: Lipase: 42 U/L (ref 11.0–59.0)

## 2017-03-30 LAB — AMYLASE: Amylase: 22 U/L — ABNORMAL LOW (ref 27–131)

## 2017-03-30 NOTE — Progress Notes (Signed)
Melissa Kirby - 56 y.o. female MRN [id number]  Date of birth: 01/19/1961  SUBJECTIVE:  Including Melissa Kirby Chief Complaint  Patient presents with  . Hospitalization Follow-up    Patient is here today to F/U hospital visit yesterday from [facility].  Urgent Care in [place] sent her there by EMS for fever and chest pains then went through to her back.  Started her on Doxycycline, Motrin and Zofran.  She was given Gabapentin last week but it cause her to feel nauseated so she is unable to take that.    Melissa Kirby is a 56 year old female is following up for fever, malaise and body aches. She reports that her symptoms have not improved. She reports a temperature that has been as high as 102 . She reports being sexually active and her last encounter was a month ago. She reports her symptoms started last Tuesday. She continues to feel terrible despite different interventions. Her lab work has been relatively normal except for some elevated liver enzymes. She denies any nausea, vomiting, diarrhea, constipation. She feels some pain with urination but this is very intermittent and mild in nature.   She was seen in the emergency department on 9/9 for chest pain. She was prescribed doxycycline, Motrin and Zofran. A CT angiogram chest was performed which showed no evidence of pulmonary emboli and respiratory motion artifact with predominant dependent opacities in both lungs that favored atelectasis. Her metabolic panel showed normal kidney function with some elevated liver enzymes. A complete blood count panel showed no anemia and normal white count. An EKG showed sinus tachycardia.   A hepatitis panel is pending.  Review of Systems  Constitutional: Positive for fever.  Respiratory: Negative for cough.   Cardiovascular: Positive for chest pain.  Endocrine: Negative for polyuria.  Genitourinary: Negative for genital sores.  Musculoskeletal: Negative for gait problem.  Neurological: Positive for weakness.  Negative for numbness.  Hematological: Negative for adenopathy.    HISTORY: Past Medical, Surgical, Social, and Family History Reviewed & Updated per EMR.   Pertinent Historical Findings include:  Past Medical History:  Diagnosis Date  . ANEMIA, IRON DEFICIENCY   . Cerebrospinal fluid leak from spinal puncture    improved with 7th patch at [facility] 05/2014  . Depression    therapy  . Fibromyalgia 1999   no meds  . GASTRITIS 11/12/2009   Qualifier: Diagnosis of  By: Theadora Rama RN, Silvio Pate    . GERD (gastroesophageal reflux disease)    per endo. - does not take any meds.    . Kidney stones 1998, 2005   x 2  . MIGRAINE HEADACHE    last Migraine 06/29/2011-   . Unspecified hypothyroidism 1982/2000   surgical resection of benign growth - partial then complete  . VITAMIN B12 DEFICIENCY 09/2009 dx    Past Surgical History:  Procedure Laterality Date  . CHOLECYSTECTOMY  2009  . DIAGNOSTIC LAPAROSCOPY  1982   diagnostic  . POSTERIOR FUSION LUMBAR SPINE  06/2011  . THYROID SURGERY  1983   partial   . TOTAL THYROIDECTOMY  2000   Had thyroid nodule, nonmalignant (4098-1191) resection  . TUBAL LIGATION  1989    Allergies  Allergen Reactions  . Compazine Anaphylaxis    Hypotension  . Metoclopramide Hcl Anaphylaxis    Hypotension  . Prochlorperazine Edisylate     Other reaction(s): Other (See Comments) Hemodynamic instability  . Gluten Meal Other (See Comments)    Vitamin B irregulaties  . Morphine And Related  Nausea Only    Family History  Problem Relation Age of Onset  . Hyperlipidemia Mother   . Heart disease Mother 52       massive MI  . Hyperlipidemia Father   . COPD Father        severe, smoker  . Breast cancer Maternal Grandmother   . Hyperlipidemia Maternal Grandmother   . Coronary artery disease Maternal Grandmother 78       CABG  . Ovarian cancer Other   . Anesthesia problems Neg Hx   . Malignant hyperthermia Neg Hx   . Pseudochol deficiency Neg Hx   .  Hypotension Neg Hx      Social History   Social History  . Marital status: Married    Spouse name: N/A  . Number of children: N/A  . Years of education: N/A   Occupational History  . Not on file.   Social History Main Topics  . Smoking status: Never Smoker  . Smokeless tobacco: Never Used  . Alcohol use No  . Drug use: No  . Sexual activity: Yes    Birth control/ protection: Surgical     Comment: Married lives with spouse, 3 children. 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 Not on file   Social History Narrative   Ongoing divorce from husband, separated since 05/2014. Lives alone     Works at [employer] and [employer].  Education: high school 12th grade.      Exercises regularly        PHYSICAL EXAM:  VS: BP 128/84 (BP Location: Left Arm, Patient Position: Sitting, Cuff Size: Normal)   Pulse (!) 111   Temp 99.7 F (37.6 C) (Oral)   Ht  (1.626 m)   Wt 146 lb (66.2 kg)   SpO2 98%   BMI 25.06 kg/m  Physical Exam Gen: NAD, alert, cooperative with exam,  ENT: normal lips, normal nasal mucosa,  Eye: normal EOM, normal conjunctiva and lids CV:  no edema, +2 pedal pulses, tachycardic, S1-S2   Resp: no accessory muscle use, non-labored, clear to auscultation bilaterally GI: no masses or tenderness, no hernia, soft  GU: No external lesions, no obvious discharge, cervix is normal-appearing Skin: no rashes, no areas of induration  Neuro: normal tone, normal sensation to touch Psych:  normal insight, alert and oriented MSK: Normal gait, normal strength      ASSESSMENT & PLAN:   I spent 25 minutes with this patient, greater than 50% was face-to-face time counseling regarding the below diagnosis.   Fever Unclear she has no infectious cause is the source of her symptoms. She was started on doxycycline. White count has been normal which would not suggest an infectious cause. Has been sexually active several tests for those sources. -  HIV, RPR, wet prep, gonococcal - Blood culture, urinalysis - Advised to follow up on Friday. If no improvement may need to consider the elevated livers enzymes as more of a source and may need to consider an ultrasound  Elevated liver enzymes This was noted during her emergency room visit. Significant elevated from baseline. No history of alcohol abuse. - Hepatitis panel is pending - May need to perform an ultrasound - May need to consider an autoimmune workup if hepatitis is normal  Chest pain She reports sharp sternal chest pain that radiates to her back. CTA June was negative for PE. EKG has been normal but does show sinus tachycardia but appears that she is  tachycardic on several visits previously. - Amylase and lipase - Has been started on Motrin for a possible myocarditis

## 2017-03-30 NOTE — Patient Instructions (Signed)
Thank you for coming in,   We will call you with the results from today.    Please feel free to call with any questions or concerns at any time, at [phone]. --Dr. Schmitz  

## 2017-03-30 NOTE — Assessment & Plan Note (Addendum)
This was noted during her emergency room visit. Significant elevated from baseline. No history of alcohol abuse. - Hepatitis panel is pending - May need to perform an ultrasound - May need to consider an autoimmune workup if hepatitis is normal

## 2017-03-30 NOTE — Assessment & Plan Note (Signed)
She reports sharp sternal chest pain that radiates to her back. CTA June was negative for PE. EKG has been normal but does show sinus tachycardia but appears that she is tachycardic on several visits previously. - Amylase and lipase - Has been started on Motrin for a possible myocarditis

## 2017-03-30 NOTE — Assessment & Plan Note (Signed)
Unclear she has no infectious cause is the source of her symptoms. She was started on doxycycline. White count has been normal which would not suggest an infectious cause. Has been sexually active several tests for those sources. - HIV, RPR, wet prep, gonococcal - Blood culture, urinalysis - Advised to follow up on Friday. If no improvement may need to consider the elevated livers enzymes as more of a source and may need to consider an ultrasound

## 2017-03-31 LAB — HEPATITIS PANEL, ACUTE
HCV Ab: <0.1 s/co ratio (ref 0.0–0.9)
Hep A IgM: NEGATIVE
Hep B C IgM: NEGATIVE
Hepatitis B Surface Ag: NEGATIVE

## 2017-03-31 LAB — RPR: RPR Ser Ql: NONREACTIVE

## 2017-03-31 NOTE — Telephone Encounter (Signed)
Informed patient of results.   Myra RudeSchmitz, Kenadi Miltner E, MD [facility] 03/31/2017, 9:10 AM

## 2017-04-01 ENCOUNTER — Telehealth: Payer: Self-pay | Admitting: Internal Medicine

## 2017-04-01 DIAGNOSIS — R945 Abnormal results of liver function studies: Principal | ICD-10-CM

## 2017-04-01 DIAGNOSIS — R509 Fever, unspecified: Secondary | ICD-10-CM

## 2017-04-01 DIAGNOSIS — R7989 Other specified abnormal findings of blood chemistry: Secondary | ICD-10-CM

## 2017-04-01 NOTE — Telephone Encounter (Signed)
Pt seen Dr Jordan Likesschmitz, she said that she has been taken the meds that he gave her but she has spiked a fever today again and she would like to know what she needs to do?

## 2017-04-01 NOTE — Telephone Encounter (Signed)
She needs additional blood work and an abd ultrasound - all ordered - please have her get blood work asap.  [place] ordered stat.  Continue doxy for now

## 2017-04-02 ENCOUNTER — Other Ambulatory Visit (INDEPENDENT_AMBULATORY_CARE_PROVIDER_SITE_OTHER): Payer: 59

## 2017-04-02 DIAGNOSIS — R7989 Other specified abnormal findings of blood chemistry: Secondary | ICD-10-CM

## 2017-04-02 DIAGNOSIS — R509 Fever, unspecified: Secondary | ICD-10-CM

## 2017-04-02 DIAGNOSIS — R945 Abnormal results of liver function studies: Principal | ICD-10-CM

## 2017-04-02 LAB — COMPREHENSIVE METABOLIC PANEL
ALT: 285 U/L — ABNORMAL HIGH (ref 0–35)
AST: 260 U/L — ABNORMAL HIGH (ref 0–37)
Albumin: 3.6 g/dL (ref 3.5–5.2)
Alkaline Phosphatase: 779 U/L — ABNORMAL HIGH (ref 39–117)
BUN: 13 mg/dL (ref 6–23)
CO2: 26 mEq/L (ref 19–32)
Calcium: 9.8 mg/dL (ref 8.4–10.5)
Chloride: 100 mEq/L (ref 96–112)
Creatinine, Ser: 0.86 mg/dL (ref 0.40–1.20)
GFR: 72.82 mL/min (ref >60.00)
Glucose, Bld: 104 mg/dL — ABNORMAL HIGH (ref 70–99)
Potassium: 4.6 mEq/L (ref 3.5–5.1)
Sodium: 135 mEq/L (ref 135–145)
Total Bilirubin: 0.7 mg/dL (ref 0.2–1.2)
Total Protein: 7.4 g/dL (ref 6.0–8.3)

## 2017-04-02 LAB — SEDIMENTATION RATE: Sed Rate: 39 mm/hr — ABNORMAL HIGH (ref 0–30)

## 2017-04-02 LAB — C-REACTIVE PROTEIN: CRP: 3.6 mg/dL (ref 0.5–20.0)

## 2017-04-02 NOTE — Telephone Encounter (Signed)
Spoke with pt to inform. She will have someone bring her to lab for blood work.

## 2017-04-03 ENCOUNTER — Ambulatory Visit (INDEPENDENT_AMBULATORY_CARE_PROVIDER_SITE_OTHER): Payer: 59 | Admitting: Family Medicine

## 2017-04-03 ENCOUNTER — Other Ambulatory Visit: Payer: 59

## 2017-04-03 ENCOUNTER — Ambulatory Visit (HOSPITAL_COMMUNITY)
Admission: RE | Admit: 2017-04-03 | Discharge: 2017-04-03 | Disposition: A | Discharge status: Home / Self Care | Payer: 59 | Source: Ambulatory Visit | Attending: Internal Medicine | Admitting: Internal Medicine

## 2017-04-03 ENCOUNTER — Encounter: Payer: Self-pay | Admitting: Family Medicine

## 2017-04-03 VITALS — BP 118/84 | HR 119 | Temp 98.5°F | Ht 64.0 in | Wt 145.0 lb

## 2017-04-03 DIAGNOSIS — R509 Fever, unspecified: Secondary | ICD-10-CM

## 2017-04-03 DIAGNOSIS — R748 Abnormal levels of other serum enzymes: Secondary | ICD-10-CM

## 2017-04-03 DIAGNOSIS — R945 Abnormal results of liver function studies: Secondary | ICD-10-CM

## 2017-04-03 DIAGNOSIS — R7989 Other specified abnormal findings of blood chemistry: Secondary | ICD-10-CM | POA: Diagnosis not present

## 2017-04-03 DIAGNOSIS — Z9049 Acquired absence of other specified parts of digestive tract: Secondary | ICD-10-CM | POA: Insufficient documentation

## 2017-04-03 LAB — POC INFLUENZA TEST
Negative: NEGATIVE
Positive: NEGATIVE

## 2017-04-03 NOTE — Assessment & Plan Note (Addendum)
She has continued symptoms that lab work and imaging have not explained - Blood cultures, HIV, and Epstein-Barr today - If no improvement or worsens then advised to schedule to the emergency department. - The symptoms still remained and may need a referral to GI versus infectious disease - Flu test was negative today - Work note provided today. Advised follow-up next Wednesday.

## 2017-04-03 NOTE — Progress Notes (Signed)
Melissa Kirby - 56 y.o. female MRN [id number]  Date of birth: [birth year]  SUBJECTIVE:  Including CC & ROS.  Chief Complaint  Patient presents with  . Follow-up    follow up from 03/30/2017--bodyache and running fever--headache is better--alternate between ibuprofen and tylenol     Melissa Kirby is a 56 year old female that is following up for her ongoing fever. She has had improvement of her headache but her other symptoms still remain the same. She is continue taking her antibiotic. She has fevers usually at night and up to 103. She does not feel much improvement with the doxycycline.   She was seen in the emergency department on 9/9 for chest pain. She was prescribed doxycycline Motrin and Zofran. A CT angiogram chest performed which showed no evidence of pulmonary emboli and respiratory motion artifact with predominant dependent opacities in both lungs that favored atelectasis. Her metabolic panel showed normal kidney function with an elevated liver enzymes. A complete blood count showed no anemia and a normal white count. EKG showed showed sinus tachycardia. Hepatitis panel from that visit was normal.  She was seen by myself on 9/10. A urinalysis was negative for infection. Amylase and lipase were normal. A wet prep showed some suggestion of bacterial vaginosis but she was asymptomatic. RPR was nonreactive.  Lab work from 9/13 shows continued elevated liver enzymes.  Review of Systems  Constitutional: Positive for chills, fatigue and fever.  Respiratory: Negative for shortness of breath.   Cardiovascular: Positive for chest pain.  Gastrointestinal: Negative for abdominal pain, nausea and vomiting.  Musculoskeletal: Negative for gait problem.  Skin: Negative for color change.  Neurological: Positive for weakness. Negative for numbness.  Hematological: Negative for adenopathy.    HISTORY: Past Medical, Surgical, Social, and Family History Reviewed & Updated per EMR.   Pertinent Historical  Findings include:  Past Medical History:  Diagnosis Date  . ANEMIA, IRON DEFICIENCY   . Cerebrospinal fluid leak from spinal puncture    improved with 7th patch at [facility] 05/2014  . Depression    therapy  . Fibromyalgia 1999   no meds  . GASTRITIS 11/12/2009   Qualifier: Diagnosis of  By: Theadora Rama RN, Silvio Pate    . GERD (gastroesophageal reflux disease)    per endo. - does not take any meds.    . Kidney stones 1998, 2005   x 2  . MIGRAINE HEADACHE    last Migraine 06/29/2011-   . Unspecified hypothyroidism 1982/2000   surgical resection of benign growth - partial then complete  . VITAMIN B12 DEFICIENCY 09/2009 dx    Past Surgical History:  Procedure Laterality Date  . CHOLECYSTECTOMY  2009  . DIAGNOSTIC LAPAROSCOPY  1982   diagnostic  . POSTERIOR FUSION LUMBAR SPINE  06/2011  . THYROID SURGERY  1983   partial   . TOTAL THYROIDECTOMY  2000   Had thyroid nodule, nonmalignant (4098-1191) resection  . TUBAL LIGATION  1989    Allergies  Allergen Reactions  . Compazine Anaphylaxis    Hypotension  . Metoclopramide Hcl Anaphylaxis    Hypotension  . Prochlorperazine Edisylate     Other reaction(s): Other (See Comments) Hemodynamic instability  . Gluten Meal Other (See Comments)    Vitamin B irregulaties  . Morphine And Related Nausea Only    Family History  Problem Relation Age of Onset  . Hyperlipidemia Mother   . Heart disease Mother 67       massive MI  . Hyperlipidemia Father   . COPD  Father        severe, smoker  . Breast cancer Maternal Grandmother   . Hyperlipidemia Maternal Grandmother   . Coronary artery disease Maternal Grandmother 78       CABG  . Ovarian cancer Other   . Anesthesia problems Neg Hx   . Malignant hyperthermia Neg Hx   . Pseudochol deficiency Neg Hx   . Hypotension Neg Hx      Social History   Social History  . Marital status: Married    Spouse name: N/A  . Number of children: N/A  . Years of education: N/A   Occupational History    . Not on file.   Social History Main Topics  . Smoking status: Never Smoker  . Smokeless tobacco: Never Used  . Alcohol use No  . Drug use: No  . Sexual activity: Yes    Birth control/ protection: Surgical     Comment: Married lives with spouse, 3 children. 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 Not on file   Social History Narrative   Ongoing divorce from husband, separated since 05/2014. Lives alone     Works at [employer] and [employer].  Education: high school 12th grade.      Exercises regularly        PHYSICAL EXAM:  VS: BP 118/84   Pulse (!) 119   Temp 98.5 F (36.9 C)   Ht  (1.626 m)   Wt 145 lb (65.8 kg)   SpO2 97%   BMI 24.89 kg/m  Physical Exam Gen: NAD, alert, cooperative with exam,  ENT: normal lips, normal nasal mucosa,  Eye: normal EOM, normal conjunctiva and lids CV:  no edema, +2 pedal pulses, tachycardia, S1-S2   Resp: no accessory muscle use, non-labored, clear to auscultation bilaterally, nonlabored GI: no masses or tenderness, no hernia  Skin: no rashes, no areas of induration  Neuro: normal tone, normal sensation to touch Psych:  normal insight, alert and oriented MSK: Normal gait, normal strength      ASSESSMENT & PLAN:   Fever She has continued symptoms that lab work and imaging have not explained - Blood cultures, HIV, and Epstein-Barr today - If no improvement or worsens then advised to schedule to the emergency department. - The symptoms still remained and may need a referral to GI versus infectious disease - Flu test was negative today - Work note provided today. Advised follow-up next Wednesday.  Elevated liver enzymes Hepatitis panel was normal. Enzymes continued to be elevated. - Ultrasound later this afternoon. - May need referral to GI.

## 2017-04-03 NOTE — Patient Instructions (Signed)
Thank you for coming in,   We will call you with the results from today.   If you symptoms get worse then please be seen in the emergency room.    Please feel free to call with any questions or concerns at any time, at [phone]. --Dr. Jordan Likes

## 2017-04-03 NOTE — Assessment & Plan Note (Signed)
Hepatitis panel was normal. Enzymes continued to be elevated. - Ultrasound later this afternoon. - May need referral to GI.

## 2017-04-06 ENCOUNTER — Ambulatory Visit (INDEPENDENT_AMBULATORY_CARE_PROVIDER_SITE_OTHER): Payer: 59 | Admitting: Internal Medicine

## 2017-04-06 ENCOUNTER — Other Ambulatory Visit (INDEPENDENT_AMBULATORY_CARE_PROVIDER_SITE_OTHER): Payer: 59

## 2017-04-06 ENCOUNTER — Ambulatory Visit (INDEPENDENT_AMBULATORY_CARE_PROVIDER_SITE_OTHER)
Admission: RE | Admit: 2017-04-06 | Discharge: 2017-04-06 | Disposition: A | Discharge status: Home / Self Care | Payer: 59 | Source: Ambulatory Visit | Attending: Internal Medicine | Admitting: Internal Medicine

## 2017-04-06 ENCOUNTER — Encounter: Payer: Self-pay | Admitting: Internal Medicine

## 2017-04-06 VITALS — BP 132/98 | HR 130 | Temp 99.9°F | Resp 16

## 2017-04-06 DIAGNOSIS — M791 Myalgia, unspecified site: Secondary | ICD-10-CM | POA: Insufficient documentation

## 2017-04-06 DIAGNOSIS — R509 Fever, unspecified: Secondary | ICD-10-CM

## 2017-04-06 DIAGNOSIS — R7989 Other specified abnormal findings of blood chemistry: Secondary | ICD-10-CM | POA: Diagnosis not present

## 2017-04-06 DIAGNOSIS — R945 Abnormal results of liver function studies: Secondary | ICD-10-CM

## 2017-04-06 LAB — COMPREHENSIVE METABOLIC PANEL
ALT: 139 U/L — ABNORMAL HIGH (ref 0–35)
AST: 82 U/L — ABNORMAL HIGH (ref 0–37)
Albumin: 3.6 g/dL (ref 3.5–5.2)
Alkaline Phosphatase: 681 U/L — ABNORMAL HIGH (ref 39–117)
BUN: 12 mg/dL (ref 6–23)
CO2: 24 mEq/L (ref 19–32)
Calcium: 9.7 mg/dL (ref 8.4–10.5)
Chloride: 98 mEq/L (ref 96–112)
Creatinine, Ser: 0.77 mg/dL (ref 0.40–1.20)
GFR: 82.72 mL/min (ref >60.00)
Glucose, Bld: 98 mg/dL (ref 70–99)
Potassium: 4.3 mEq/L (ref 3.5–5.1)
Sodium: 132 mEq/L — ABNORMAL LOW (ref 135–145)
Total Bilirubin: 0.6 mg/dL (ref 0.2–1.2)
Total Protein: 8.4 g/dL — ABNORMAL HIGH (ref 6.0–8.3)

## 2017-04-06 LAB — GAMMA GT: GGT: 283 U/L — ABNORMAL HIGH (ref 7–51)

## 2017-04-06 LAB — ANA: Anti Nuclear Antibody(ANA): POSITIVE — AB

## 2017-04-06 LAB — EPSTEIN-BARR VIRUS VCA, IGM: EBV VCA IgM: <36 U/mL

## 2017-04-06 LAB — B. BURGDORFI ANTIBODIES: B burgdorferi Ab IgG+IgM: <0.9 index

## 2017-04-06 LAB — EPSTEIN-BARR VIRUS VCA, IGG: EBV VCA IgG: 239 U/mL — ABNORMAL HIGH

## 2017-04-06 LAB — ANTI-NUCLEAR AB-TITER (ANA TITER): ANA Titer 1: 1:40 {titer} — ABNORMAL HIGH

## 2017-04-06 LAB — HIV ANTIBODY (ROUTINE TESTING W REFLEX): HIV 1&2 Ab, 4th Generation: NONREACTIVE

## 2017-04-06 MED ORDER — IOPAMIDOL (ISOVUE-300) INJECTION 61%
80.0000 mL | Freq: Once | INTRAVENOUS | Status: AC | PRN
  Administered 2017-04-06: 80 mL via INTRAVENOUS

## 2017-04-06 NOTE — Patient Instructions (Signed)
Blood work and a ct of your abdomen was ordered.   Test(s) ordered today. Your results will be released to MyChart (or called to you) after review, usually within 72hours after test completion. If any changes need to be made, you will be notified at that same time.  Stop the doxycyline.     A referral was ordered for ID and GI.

## 2017-04-06 NOTE — Assessment & Plan Note (Addendum)
Persistent fever 99-103 Taking Tylenol and Motrin ? Infectious disease or autoimmune Continue to treat symptomatically We'll refer to ID No travel or obvious tick bites, but she states she does have but bites on occasion Will check ehrlichia and babesia panels, but she has been on doxycycline and has been no improvement

## 2017-04-06 NOTE — Progress Notes (Signed)
Subjective:    Patient ID: Melissa Kirby, female    DOB: 12/04/1960, 56 y.o.   MRN: [id number]  HPI The patient is here for follow up.  Her symptoms started the day after labor day - she woke up and was achy all over in her muscles and joints.  She also had headaches.  She was not having fever or chills at that time.  She was seen here and it was thought she may be having a fibromyalgia flare.  She was started on gabapentin but did not tolerate it due to nausea.  She did have a fever the next day of 102.  She developed chest pain/tighteness and went to the ED on 9/9.  She had a Ct Angio of her chest which was normal.  Her LFTs were elevated. topronin was negative. Her d-dimer was elevated.  She had IVF, pain medication, anti-nausea medication.  She was discharged home on doxycycline.  She followed up here and further testing has been negative.   She continues to have a fever that ranges from 99-103.  She is taking tylenols and advil.  She States decreased appetite and is not able to eat much, fatigue, continued chest pain, palpitations, some constipation, vomiting the past couple of nights after laying down, joint pain, muscle pain, back pain, neck pain, headaches and lightheadedness.  No travel in last 6 months.  No antibotics except doxycycline.  No known tick bites.    Medications and allergies reviewed with patient and updated if appropriate.  Patient Active Problem List   Diagnosis Date Noted  . Fever 03/30/2017  . Elevated liver enzymes 03/30/2017  . Chest pain 03/30/2017  . Acute non intractable tension-type headache 03/24/2017  . Rib pain on left side 09/02/2016  . SOB (shortness of breath) 09/02/2016  . Cough 08/20/2016  . LLQ pain 05/05/2016  . Intracranial hypotension 01/24/2014  . Vitamin D deficiency   . Abnormal brain MRI 10/15/2013  . Elevated blood pressure reading 10/13/2013  . LBP (low back pain) 05/10/2012  . Fibromyalgia   . Fatigue 01/17/2011  . Atrophic  vaginitis 10/17/2010  . Depression 10/17/2010  . Vitamin B12 deficiency (non anemic) 10/04/2009  . Hypothyroidism 10/03/2009  . Migraine headache 10/03/2009    Current Outpatient Prescriptions on File Prior to Visit  Medication Sig Dispense Refill  . doxycycline (VIBRAMYCIN) 100 MG capsule Take 1 capsule (100 mg total) by mouth 2 (two) times daily. 20 capsule 0  . ibuprofen (ADVIL,MOTRIN) 600 MG tablet Take 1 tablet (600 mg total) by mouth every 6 (six) hours as needed. 30 tablet 0  . levothyroxine (SYNTHROID, LEVOTHROID) 125 MCG tablet Take 1 tablet (125 mcg total) by mouth daily. 90 tablet 3  . nortriptyline (PAMELOR) 50 MG capsule TAKE 2 CAPSULES (100 MG TOTAL) BY MOUTH AT BEDTIME. 180 capsule 1  . ondansetron (ZOFRAN ODT) 4 MG disintegrating tablet Take 1 tablet (4 mg total) by mouth every 8 (eight) hours as needed for nausea. 10 tablet 0   No current facility-administered medications on file prior to visit.     Past Medical History:  Diagnosis Date  . ANEMIA, IRON DEFICIENCY   . Cerebrospinal fluid leak from spinal puncture    improved with 7th patch at [facility] 05/2014  . Depression    therapy  . Fibromyalgia 1999   no meds  . GASTRITIS 11/12/2009   Qualifier: Diagnosis of  By: Theadora Rama RN, Silvio Pate    . GERD (gastroesophageal reflux disease)  per endo. - does not take any meds.    . Kidney stones 1998, 2005   x 2  . MIGRAINE HEADACHE    last Migraine 06/29/2011-   . Unspecified hypothyroidism 1982/2000   surgical resection of benign growth - partial then complete  . VITAMIN B12 DEFICIENCY 09/2009 dx    Past Surgical History:  Procedure Laterality Date  . CHOLECYSTECTOMY  2009  . DIAGNOSTIC LAPAROSCOPY  1982   diagnostic  . POSTERIOR FUSION LUMBAR SPINE  06/2011  . THYROID SURGERY  1983   partial   . TOTAL THYROIDECTOMY  2000   Had thyroid nodule, nonmalignant (1610-9604) resection  . TUBAL LIGATION  1989    Social History   Social History  . Marital status:  Married    Spouse name: N/A  . Number of children: N/A  . Years of education: N/A   Social History Main Topics  . Smoking status: Never Smoker  . Smokeless tobacco: Never Used  . Alcohol use No  . Drug use: No  . Sexual activity: Yes    Birth control/ protection: Surgical     Comment: Married lives with spouse, 3 children. 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 Not on file   Social History Narrative   Ongoing divorce from husband, separated since 05/2014. Lives alone     Works at [employer] and [employer].  Education: high school 12th grade.      Exercises regularly       Family History  Problem Relation Age of Onset  . Hyperlipidemia Mother   . Heart disease Mother 15       massive MI  . Hyperlipidemia Father   . COPD Father        severe, smoker  . Breast cancer Maternal Grandmother   . Hyperlipidemia Maternal Grandmother   . Coronary artery disease Maternal Grandmother 78       CABG  . Ovarian cancer Other   . Anesthesia problems Neg Hx   . Malignant hyperthermia Neg Hx   . Pseudochol deficiency Neg Hx   . Hypotension Neg Hx     Review of Systems  Constitutional: Positive for appetite change (decreased), chills, diaphoresis, fatigue and fever (99-103.4).  HENT: Negative for congestion, ear pain and sore throat.   Respiratory: Negative for cough, shortness of breath and wheezing.   Cardiovascular: Positive for chest pain (substernal - goes through to back) and palpitations. Negative for leg swelling.  Gastrointestinal: Positive for constipation and vomiting (last two nights when she has laid down). Negative for abdominal pain, blood in stool and nausea.  Genitourinary: Negative for dysuria, frequency, hematuria and vaginal discharge.  Musculoskeletal: Positive for arthralgias, back pain (across upper and middle back), myalgias and neck pain.  Skin: Negative for rash.  Neurological: Positive for light-headedness and headaches.    Hematological: Negative for adenopathy.       Objective:   Vitals:   04/06/17 0934  BP: (!) 132/98  Pulse: (!) 130  Resp: 16  Temp: 99.9 F (37.7 C)  SpO2: 94%   Wt Readings from Last 3 Encounters:  04/03/17 145 lb (65.8 kg)  03/30/17 146 lb (66.2 kg)  03/29/17 146 lb (66.2 kg)   There is no height or weight on file to calculate BMI.   Physical Exam    Constitutional: Appears ill. No distress.  HENT:  Head: Normocephalic and atraumatic.  Neck: Bilateral ear canals and tympanic membranes normal. Dry mucous membranes,  no oropharynx erythema or exudate. Neck supple. No tracheal deviation present. No thyromegaly present.  No cervical lymphadenopathy Cardiovascular: Normal rate, regular rhythm and normal heart sounds.   No murmur heard. No carotid bruit .  No edema Pulmonary/Chest: Effort normal and breath sounds normal. No respiratory distress. No has no wheezes. No rales.  Abdomen: Soft, nondistended, nontender, no hepatosplenomegaly, no masses  Skin: Skin is warm and dry. Not diaphoretic.  Psychiatric: Normal mood and affect. Behavior is normal.      Assessment & Plan:    See Problem List for Assessment and Plan of chronic medical problems.

## 2017-04-06 NOTE — Assessment & Plan Note (Signed)
Diffuse, significant joint and muscle pain Pain is above the knee on what fibromyalgia cause Continue symptomatic treatment until cause is found

## 2017-04-06 NOTE — Assessment & Plan Note (Signed)
Elevated liver enzymes ? Related to infectious cause for autoimmune hepatitis ANA pending, will check mitochondrial antibodies and anti-smooth muscle antibody Ultrasound of the abdomen was normal-she is not having pain, but liver tests still elevated so I will order a CT of the abdomen Refer to GI

## 2017-04-07 ENCOUNTER — Encounter: Payer: Self-pay | Admitting: Physician Assistant

## 2017-04-07 ENCOUNTER — Other Ambulatory Visit (INDEPENDENT_AMBULATORY_CARE_PROVIDER_SITE_OTHER): Payer: 59

## 2017-04-07 ENCOUNTER — Ambulatory Visit (INDEPENDENT_AMBULATORY_CARE_PROVIDER_SITE_OTHER): Payer: 59 | Admitting: Physician Assistant

## 2017-04-07 VITALS — BP 100/66 | HR 100 | Ht 63.0 in | Wt 147.0 lb

## 2017-04-07 DIAGNOSIS — R509 Fever, unspecified: Secondary | ICD-10-CM

## 2017-04-07 DIAGNOSIS — R1013 Epigastric pain: Secondary | ICD-10-CM | POA: Diagnosis not present

## 2017-04-07 DIAGNOSIS — R7989 Other specified abnormal findings of blood chemistry: Secondary | ICD-10-CM | POA: Diagnosis not present

## 2017-04-07 DIAGNOSIS — R945 Abnormal results of liver function studies: Principal | ICD-10-CM

## 2017-04-07 DIAGNOSIS — R110 Nausea: Secondary | ICD-10-CM | POA: Diagnosis not present

## 2017-04-07 LAB — IBC PANEL
Iron: 26 ug/dL — ABNORMAL LOW (ref 42–145)
Saturation Ratios: 7.3 % — ABNORMAL LOW (ref 20.0–50.0)
Transferrin: 254 mg/dL (ref 212.0–360.0)

## 2017-04-07 LAB — FERRITIN: Ferritin: 269.2 ng/mL (ref 10.0–291.0)

## 2017-04-07 MED ORDER — OMEPRAZOLE 40 MG PO CPDR: 40.0000 mg | DELAYED_RELEASE_CAPSULE | Freq: Every day | ORAL | 2 refills | Status: DC

## 2017-04-07 NOTE — Patient Instructions (Signed)
Your physician has requested that you go to the basement for lab work before leaving today.  We have sent the following medications to your pharmacy for you to pick up at your convenience: Omeprazole 40 mg daily 30-60 mins before breakfast.

## 2017-04-07 NOTE — Progress Notes (Signed)
Chief Complaint: Elevated LFTs, fatigue, fever, nausea, epigastric pain  HPI:  Melissa Kirby is a 56 year old Caucasian female with a past medical history as listed below including fibromyalgia  who was referred to me by Binnie Rail, MD for a complaint of elevated LFTs, fever, fatigue, nausea and epigastric pain.    Patient has previously followed in our clinic with Dr. Sharlett Iles with an EGD and colonoscopy in 2011. Colonoscopy normal, repeat recommended in 10 years. EGD moderate gastritis and hiatal hernia.    Recent CMP 9 days ago showed elevated LFTs with an alkaline phosphatase of 484, AST 329, ALT 253, next draw 5 days ago showed an alkaline phosphatase of 779, AST 260, ALT 285, yesterday alkaline phosphatase 681, AST 82 and ALT 139. Patient had testing for Lyme disease which was negative. ESR elevated 5 days ago at 39. CRP normal. ANA positive in a nucleolar pattern 1:40. Influenza  Negative 04/03/17. HIV nonreactive. Epstein-Barr IgG elevated at 239. Epstein-Barr IgM normal. Blood cultures collected 04/03/17 were negative. Yesterday patient had AMA and ASMA  as well as Ehrlichia-these are pending. GGT was elevated at 283.    Ultrasound of the abdomen on 04/03/17 showed no cause for the patient's symptoms. The patient is status post cholecystectomy and no other abnormalities. CT abdomen and pelvis 04/06/17 showed no acute findings or other significant abnormality within the abdomen. Mild bibasilar atelectasis.   Today, the patient presents to clinic and tells me that on 03/14/17 she started with a headache which lasted for 3 days and then she started to "ache all over". She went to her PCP but at that time had no fever and he thought this was just her fibromyalgia "acting up". She was put on Ibuprofen. She continued with a fever over the next week and in fact could not work on 1 or 2 days due to increased fatigue. She stayed in the bed over the weekend then developed a fever of 103.4. She went to the  urgent care and was sent to the ER for possible "blood clot", she had a CT and labs which were normal. She was started on Doxycycline and given 600 mg ibuprofen every 6 hours. Doxycycline was stopped yesterday by PCP at appt.   Patient describes today that over this whole time she has also had a "chest/epigastric" pain that radiates through to her back. This is worse when she lays flat or eats anything. She has also had associated nausea and 2 episodes of vomiting over the past 2 weeks. Most recently, she has had a decrease in her bowel movements and has developed anorexia as well as increasing fatigue. Currently, she is drinking water and some Gatorade but "everything tastes bad". She is cycling Tylenol and Ibuprofen to avoid a fever.   Patient denies blood in her stool, melena, weight loss, jaundice, heartburn, reflux or family history of liver disease or blood transfusions.  Past Medical History:  Diagnosis Date  . ANEMIA, IRON DEFICIENCY   . Cerebrospinal fluid leak from spinal puncture    improved with 7th patch at [facility] 05/2014  . Depression    therapy  . Fibromyalgia 1999   no meds  . GASTRITIS 11/12/2009   Qualifier: Diagnosis of  By: Dicie Beam RN, Adela Lank    . GERD (gastroesophageal reflux disease)    per endo. - does not take any meds.    . Kidney stones 1998, 2005   x 2  . MIGRAINE HEADACHE    last Migraine 06/29/2011-   .  Unspecified hypothyroidism 1982/2000   surgical resection of benign growth - partial then complete  . VITAMIN B12 DEFICIENCY 09/2009 dx    Past Surgical History:  Procedure Laterality Date  . CHOLECYSTECTOMY  2009  . DIAGNOSTIC LAPAROSCOPY  1982   diagnostic  . POSTERIOR FUSION LUMBAR SPINE  06/2011  . THYROID SURGERY  1983   partial   . TOTAL THYROIDECTOMY  2000   Had thyroid nodule, nonmalignant ([id number]) resection  . TUBAL LIGATION  1989    Current Outpatient Prescriptions  Medication Sig Dispense Refill  . ibuprofen (ADVIL,MOTRIN) 600 MG tablet  Take 1 tablet (600 mg total) by mouth every 6 (six) hours as needed. 30 tablet 0  . levothyroxine (SYNTHROID, LEVOTHROID) 125 MCG tablet Take 1 tablet (125 mcg total) by mouth daily. 90 tablet 3  . nortriptyline (PAMELOR) 50 MG capsule TAKE 2 CAPSULES (100 MG TOTAL) BY MOUTH AT BEDTIME. 180 capsule 1  . omeprazole (PRILOSEC) 40 MG capsule Take 1 capsule (40 mg total) by mouth daily. 30-60 mins before breakfast 30 capsule 2   No current facility-administered medications for this visit.     Allergies as of 04/07/2017 - Review Complete 04/07/2017  Allergen Reaction Noted  . Compazine Anaphylaxis 06/29/2011  . Metoclopramide hcl Anaphylaxis 06/29/2011  . Prochlorperazine edisylate  09/14/2015  . Gluten meal Other (See Comments) 07/09/2011  . Morphine and related Nausea Only 07/04/2011    Family History  Problem Relation Age of Onset  . Hyperlipidemia Mother   . Heart disease Mother 41       massive MI  . Hyperlipidemia Father   . COPD Father        severe, smoker  . Breast cancer Maternal Grandmother   . Hyperlipidemia Maternal Grandmother   . Coronary artery disease Maternal Grandmother 78       CABG  . Ovarian cancer Other   . Anesthesia problems Neg Hx   . Malignant hyperthermia Neg Hx   . Pseudochol deficiency Neg Hx   . Hypotension Neg Hx     Social History   Social History  . Marital status: Married    Spouse name: N/A  . Number of children: 3  . Years of education: N/A   Occupational History  . Not on file.   Social History Main Topics  . Smoking status: Never Smoker  . Smokeless tobacco: Never Used  . Alcohol use No  . Drug use: No  . Sexual activity: Yes    Birth control/ protection: Surgical     Comment: Married lives with spouse, 3 children. 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 Not on file   Social History Narrative   Ongoing divorce from husband, separated since 05/2014. Lives alone     Works at [employer] and  [employer].  Education: high school 12th grade.      Exercises regularly       Review of Systems:    Constitutional: No weight loss Skin: No rash  Cardiovascular:See HPI Respiratory: No SOB Gastrointestinal: See HPI and otherwise negative Genitourinary: No dysuria  Neurological: No  syncope Musculoskeletal: Positive for muscle aches and pains Hematologic: No bleeding Psychiatric: Positive history of depression   Physical Exam:  Vital signs: BP 100/66   Pulse 100   Ht '5\' 3"'  (1.6 m)   Wt 147 lb (66.7 kg)   BMI 26.04 kg/m   Constitutional:   Pleasant Caucasian female appears to be in Mild distress and lethargic,  alert and cooperative Head:  Normocephalic and atraumatic. Eyes:   PEERL, EOMI. No icterus. Conjunctiva pink. Ears:  Normal auditory acuity. Neck:  Supple Throat: Oral cavity and pharynx without inflammation, swelling or lesion.  Respiratory: Respirations even and unlabored. Lungs clear to auscultation bilaterally.   No wheezes, crackles, or rhonchi.  Cardiovascular: Normal S1, S2. No MRG. Regular rate and rhythm. No peripheral edema, cyanosis or pallor.  Gastrointestinal:  Soft, nondistended, nontender. No rebound or guarding. Normal bowel sounds. No appreciable masses or hepatomegaly. Rectal:  Not performed.  Msk:  Symmetrical without gross deformities. Without edema, no deformity or joint abnormality.  Neurologic:  Alert and  oriented x4;  grossly normal neurologically.  Skin:   Dry and intact without significant lesions or rashes. Psychiatric: Demonstrates good judgement and reason without abnormal affect or behaviors.  MOST RECENT LABS AND IMAGING (see HPI as well): CBC    Component Value Date/Time   WBC 8.0 03/29/2017 1519   RBC 4.20 03/29/2017 1519   HGB 12.3 03/29/2017 1519   HCT 37.6 03/29/2017 1519   PLT 206 03/29/2017 1519   MCV 89.5 03/29/2017 1519   MCH 29.3 03/29/2017 1519   MCHC 32.7 03/29/2017 1519   RDW 13.5 03/29/2017 1519   LYMPHSABS 4.0  03/29/2017 1519   MONOABS 0.5 03/29/2017 1519   EOSABS 0.3 03/29/2017 1519   BASOSABS 0.2 (H) 03/29/2017 1519    CMP     Component Value Date/Time   NA 132 (L) 04/06/2017 1037   K 4.3 04/06/2017 1037   CL 98 04/06/2017 1037   CO2 24 04/06/2017 1037   GLUCOSE 98 04/06/2017 1037   BUN 12 04/06/2017 1037   CREATININE 0.77 04/06/2017 1037   CALCIUM 9.7 04/06/2017 1037   PROT 8.4 (H) 04/06/2017 1037   ALBUMIN 3.6 04/06/2017 1037   AST 82 (H) 04/06/2017 1037   ALT 139 (H) 04/06/2017 1037   ALKPHOS 681 (H) 04/06/2017 1037   BILITOT 0.6 04/06/2017 1037   GFRNONAA >60 03/29/2017 1519   GFRAA >60 03/29/2017 1519   ABDOMEN ULTRASOUND COMPLETE 04/03/17  COMPARISON:  None.  FINDINGS: Gallbladder: Surgically absent  Common bile duct: Diameter: 4 mm  Liver: No focal lesion identified. Within normal limits in parenchymal echogenicity. Portal vein is patent on color Doppler imaging with normal direction of blood flow towards the liver.  IVC: No abnormality visualized.  Pancreas: Visualized portion unremarkable.  Spleen: Size and appearance within normal limits.  Right Kidney: Length: 11.1 cm.  1.3 cm cyst  Left Kidney: Length: 11.1 cm. Echogenicity within normal limits. No mass or hydronephrosis visualized.  Abdominal aorta: No aneurysm visualized.  Other findings: None.  IMPRESSION: No cause for the patient's symptoms identified. The patient is status post cholecystectomy. No other abnormalities.   Electronically Signed   By: Dorise Bullion III M.D   On: 04/03/2017 12:18  CT ABDOMEN WITH CONTRAST 04/06/17  TECHNIQUE: Multidetector CT imaging of the abdomen was performed using the standard protocol following bolus administration of intravenous contrast.  CONTRAST:  34m ISOVUE-300 IOPAMIDOL (ISOVUE-300) INJECTION 61%  COMPARISON:  05/06/2016  FINDINGS: Lower chest: Mild bibasilar atelectasis.  Hepatobiliary: No hepatic masses identified.  Prior cholecystectomy. No evidence of biliary dilatation.  Pancreas:  No mass or inflammatory changes.  Spleen:  Within normal limits in size and appearance.  Adrenals/Urinary Tract: No masses identified. Small simple cyst in upper pole of right kidney. No evidence of hydronephrosis.  Stomach/Bowel: Visualized portions within the abdomen are unremarkable.  Vascular/Lymphatic: No pathologically enlarged  lymph nodes identified. No abdominal aortic aneurysm.  Other:  None.  Musculoskeletal:  No suspicious bone lesions identified.  IMPRESSION: No acute findings or other significant abnormality within the abdomen.  Mild bibasilar atelectasis.   Electronically Signed   By: Earle Gell M.D.   On: 04/06/2017 12:02   Assessment: 1. Elevated LFTs: Over the past 9 days with acute illness including epigastric pain, nausea and fever as well as weakness; concern for autoimmune hepatitis versus infectious source versus other 2. Epigastric pain: With all of above, consider relation to gastritis, possibly exacerbated from recent ibuprofen use 3. Nausea: With above 4. Fever of unknown origin: Lyme testing negative, EBV negative, HIV negative, others pending  Plan: 1. Discussed case with Dr. Havery Moros at time of patient's appointment. He recommended further lab testing. 2. Ordered iron studies, total IgG, CMV, Ceruloplasmin, alpha-1 antitrypsin 3. Ordered repeat liver function test in one week, if this is still elevated recommend a liver biopsy 4. Patient was instructed to stop Ibuprofen and continued just Tylenol for fevers 5. Start omeprazole 40 mg once daily, 36 minutes for eating breakfast. 6. Patient to follow in clinic in 2-3 weeks with Dr. Havery Moros, or as otherwise advised after testing above  Melissa Newer, PA-C [facility] 04/07/2017, 3:17 PM  Cc: Binnie Rail, MD

## 2017-04-07 NOTE — Progress Notes (Signed)
Agree with assessment and plan as outlined.  Labs reviewed - patient has a chronic AP elevation for > 5 years on review of labs, now with acute marked rise in AP as well as transaminases. It appears both AP and AST/ALT have peaked and downtrending which is good news so far. Elevated pre-dates use of doxycycline. Infectious / autoimmune would be highest on differential to cause this presentation, drug induced seems unlikely - she denies any supplement use or new medication prior to onset of symptoms. Agree with testing for rickesettial etiologies. Would send IgG and ensure AMA, SMA are done given positive ANA, and also send additional viral serologies. Imaging is reassuring so far. Repeat LFTs in one week. If enzymes continue to trend down and normalize, no further workup will be needed. If her enzyme elevation persists, may need to consider a liver biopsy pending her lab workup and / or consider MRCP as well. She should avoid NSAIDs for now, use tylenol as needed. We will follow her closely. Victorino Dike please forward results of her labs to me. Thanks

## 2017-04-08 ENCOUNTER — Telehealth: Payer: Self-pay | Admitting: Internal Medicine

## 2017-04-08 ENCOUNTER — Encounter: Payer: Self-pay | Admitting: Emergency Medicine

## 2017-04-08 LAB — BABESIA MICROTI ANTIBODY PANEL
Babesia microti IgG: <1:10 {titer}
Babesia microti IgM: <1:10 {titer}

## 2017-04-08 NOTE — Telephone Encounter (Signed)
Yes I will extend her time out of work. We can extend this for an additional week and obviously she can return sooner if she feels better. So far her blood work has not revealed a cause. Some blood work is still pending and not she did see GI yesterday.

## 2017-04-08 NOTE — Telephone Encounter (Signed)
Pt called stating that she is still running a fever and is waiting on all of her tests to come back. Dr Jordan Likes wrote her a letter to get her out of work until tomorrow but she would like for this to be extended. Can Dr Lawerance Bach write a letter to extend this?  She said this can be emailed to her at [email or web address] or she can have someone come pick it up if needed.

## 2017-04-08 NOTE — Telephone Encounter (Signed)
Spoke with pt to inform. Letter written and pt will print via MyChart.

## 2017-04-09 ENCOUNTER — Encounter: Payer: Self-pay | Admitting: Internal Medicine

## 2017-04-09 LAB — CULTURE, BLOOD (SINGLE)
MICRO NUMBER:: 81017282
MICRO NUMBER:: 81017283
Result:: NO GROWTH
Result:: NO GROWTH
SPECIMEN QUALITY:: ADEQUATE
SPECIMEN QUALITY:: ADEQUATE

## 2017-04-09 LAB — EHRLICHIA ANTIBODY PANEL
E. CHAFFEENSIS AB IGG: <1:64 {titer}
E. CHAFFEENSIS AB IGM: <1:20 {titer}

## 2017-04-09 LAB — ANTI-SMOOTH MUSCLE ANTIBODY, IGG: Actin (Smooth Muscle) Antibody (IGG): <20 U

## 2017-04-09 LAB — MITOCHONDRIAL ANTIBODIES: Mitochondrial M2 Ab, IgG: <=20 U

## 2017-04-10 LAB — ALPHA-1-ANTITRYPSIN: A-1 Antitrypsin, Ser: 226 mg/dL — ABNORMAL HIGH (ref 83–199)

## 2017-04-10 LAB — CMV IGM: CMV IgM: 165 AU/mL — ABNORMAL HIGH

## 2017-04-10 LAB — CERULOPLASMIN: Ceruloplasmin: 37 mg/dL (ref 18–53)

## 2017-04-10 LAB — CYTOMEGALOVIRUS ANTIBODY, IGG: Cytomegalovirus Ab-IgG: 0.63 U/mL — ABNORMAL HIGH

## 2017-04-10 LAB — HIV ANTIBODY (ROUTINE TESTING W REFLEX): HIV 1&2 Ab, 4th Generation: NONREACTIVE

## 2017-04-10 LAB — IGG: IgG (Immunoglobin G), Serum: 1333 mg/dL (ref 694–1618)

## 2017-04-13 ENCOUNTER — Telehealth: Payer: Self-pay | Admitting: Physician Assistant

## 2017-04-13 ENCOUNTER — Other Ambulatory Visit: Payer: Self-pay

## 2017-04-13 DIAGNOSIS — R945 Abnormal results of liver function studies: Principal | ICD-10-CM

## 2017-04-13 DIAGNOSIS — R7989 Other specified abnormal findings of blood chemistry: Secondary | ICD-10-CM

## 2017-04-14 ENCOUNTER — Other Ambulatory Visit: Payer: 59

## 2017-04-14 ENCOUNTER — Other Ambulatory Visit: Payer: Self-pay

## 2017-04-14 ENCOUNTER — Encounter: Payer: Self-pay | Admitting: Emergency Medicine

## 2017-04-14 ENCOUNTER — Other Ambulatory Visit (INDEPENDENT_AMBULATORY_CARE_PROVIDER_SITE_OTHER): Payer: 59

## 2017-04-14 DIAGNOSIS — R7989 Other specified abnormal findings of blood chemistry: Secondary | ICD-10-CM | POA: Diagnosis not present

## 2017-04-14 DIAGNOSIS — R5383 Other fatigue: Secondary | ICD-10-CM

## 2017-04-14 DIAGNOSIS — R945 Abnormal results of liver function studies: Principal | ICD-10-CM

## 2017-04-14 DIAGNOSIS — B2790 Infectious mononucleosis, unspecified without complication: Secondary | ICD-10-CM

## 2017-04-14 LAB — CBC WITH DIFFERENTIAL/PLATELET
Basophils Absolute: 0.1 10*3/uL (ref 0.0–0.1)
Basophils Relative: 0.4 % (ref 0.0–3.0)
Eosinophils Absolute: 0.3 10*3/uL (ref 0.0–0.7)
Eosinophils Relative: 2.4 % (ref 0.0–5.0)
HCT: 35.7 % — ABNORMAL LOW (ref 36.0–46.0)
Hemoglobin: 11.7 g/dL — ABNORMAL LOW (ref 12.0–15.0)
Lymphocytes Relative: 68.4 % — ABNORMAL HIGH (ref 12.0–46.0)
Lymphs Abs: 8.7 10*3/uL — ABNORMAL HIGH (ref 0.7–4.0)
MCHC: 32.9 g/dL (ref 30.0–36.0)
MCV: 89.3 fl (ref 78.0–100.0)
Monocytes Absolute: 1.5 10*3/uL — ABNORMAL HIGH (ref 0.1–1.0)
Monocytes Relative: 11.7 % (ref 3.0–12.0)
Neutro Abs: 2.2 10*3/uL (ref 1.4–7.7)
Neutrophils Relative %: 17.1 % — ABNORMAL LOW (ref 43.0–77.0)
Platelets: 403 10*3/uL — ABNORMAL HIGH (ref 150.0–400.0)
RBC: 4 Mil/uL (ref 3.87–5.11)
RDW: 14.2 % (ref 11.5–15.5)
WBC: 12.7 10*3/uL — ABNORMAL HIGH (ref 4.0–10.5)

## 2017-04-14 LAB — HEPATIC FUNCTION PANEL
ALT: 40 U/L — ABNORMAL HIGH (ref 0–35)
AST: 45 U/L — ABNORMAL HIGH (ref 0–37)
Albumin: 3.5 g/dL (ref 3.5–5.2)
Alkaline Phosphatase: 400 U/L — ABNORMAL HIGH (ref 39–117)
Bilirubin, Direct: 0.2 mg/dL (ref 0.0–0.3)
Total Bilirubin: 0.5 mg/dL (ref 0.2–1.2)
Total Protein: 8 g/dL (ref 6.0–8.3)

## 2017-04-14 NOTE — Telephone Encounter (Signed)
[phone] medical records (FMLA) number given to Clydie Braun at [facility] for records needed.

## 2017-04-14 NOTE — Telephone Encounter (Signed)
Completed.

## 2017-04-14 NOTE — Telephone Encounter (Signed)
Pt called stating that she is needing a note stating that she can return to work with no restrictions as of tomorrow (04/15/17).  She will be here around 9:30 or 10:00 for lab work if it would be ready by then for her to pick it up.

## 2017-04-15 LAB — PATHOLOGIST SMEAR REVIEW

## 2017-04-21 ENCOUNTER — Other Ambulatory Visit (INDEPENDENT_AMBULATORY_CARE_PROVIDER_SITE_OTHER): Payer: 59

## 2017-04-21 DIAGNOSIS — B2790 Infectious mononucleosis, unspecified without complication: Secondary | ICD-10-CM | POA: Diagnosis not present

## 2017-04-21 LAB — CBC WITH DIFFERENTIAL/PLATELET
Basophils Absolute: 0.1 10*3/uL (ref 0.0–0.1)
Basophils Relative: 0.7 % (ref 0.0–3.0)
Eosinophils Absolute: 0.2 10*3/uL (ref 0.0–0.7)
Eosinophils Relative: 2.6 % (ref 0.0–5.0)
HCT: 33.6 % — ABNORMAL LOW (ref 36.0–46.0)
Hemoglobin: 10.9 g/dL — ABNORMAL LOW (ref 12.0–15.0)
Lymphocytes Relative: 60.9 % — ABNORMAL HIGH (ref 12.0–46.0)
Lymphs Abs: 5.5 10*3/uL — ABNORMAL HIGH (ref 0.7–4.0)
MCHC: 32.4 g/dL (ref 30.0–36.0)
MCV: 89.9 fl (ref 78.0–100.0)
Monocytes Absolute: 1.2 10*3/uL — ABNORMAL HIGH (ref 0.1–1.0)
Monocytes Relative: 13.3 % — ABNORMAL HIGH (ref 3.0–12.0)
Neutro Abs: 2 10*3/uL (ref 1.4–7.7)
Neutrophils Relative %: 22.5 % — ABNORMAL LOW (ref 43.0–77.0)
Platelets: 361 10*3/uL (ref 150.0–400.0)
RBC: 3.74 Mil/uL — ABNORMAL LOW (ref 3.87–5.11)
RDW: 14.6 % (ref 11.5–15.5)
WBC: 8.9 10*3/uL (ref 4.0–10.5)

## 2017-04-21 LAB — COMPREHENSIVE METABOLIC PANEL
ALT: 29 U/L (ref 0–35)
AST: 33 U/L (ref 0–37)
Albumin: 3.9 g/dL (ref 3.5–5.2)
Alkaline Phosphatase: 221 U/L — ABNORMAL HIGH (ref 39–117)
BUN: 8 mg/dL (ref 6–23)
CO2: 27 mEq/L (ref 19–32)
Calcium: 9.5 mg/dL (ref 8.4–10.5)
Chloride: 102 mEq/L (ref 96–112)
Creatinine, Ser: 0.84 mg/dL (ref 0.40–1.20)
GFR: 74.81 mL/min (ref >60.00)
Glucose, Bld: 97 mg/dL (ref 70–99)
Potassium: 3.8 mEq/L (ref 3.5–5.1)
Sodium: 135 mEq/L (ref 135–145)
Total Bilirubin: 0.6 mg/dL (ref 0.2–1.2)
Total Protein: 8.1 g/dL (ref 6.0–8.3)

## 2017-04-22 ENCOUNTER — Other Ambulatory Visit: Payer: Self-pay

## 2017-04-22 DIAGNOSIS — B2790 Infectious mononucleosis, unspecified without complication: Secondary | ICD-10-CM

## 2017-04-27 ENCOUNTER — Encounter: Payer: Self-pay | Admitting: Internal Medicine

## 2017-04-28 ENCOUNTER — Encounter: Payer: Self-pay | Admitting: Internal Medicine

## 2017-04-29 MED ORDER — TRIAMCINOLONE ACETONIDE 0.5 % EX OINT: 1.0000 "application " | TOPICAL_OINTMENT | Freq: Two times a day (BID) | CUTANEOUS | 0 refills | Status: DC

## 2017-05-06 ENCOUNTER — Encounter: Payer: Self-pay | Admitting: Internal Medicine

## 2017-05-15 ENCOUNTER — Other Ambulatory Visit (INDEPENDENT_AMBULATORY_CARE_PROVIDER_SITE_OTHER): Payer: 59

## 2017-05-15 DIAGNOSIS — B2790 Infectious mononucleosis, unspecified without complication: Secondary | ICD-10-CM

## 2017-05-15 LAB — HEPATIC FUNCTION PANEL
ALT: 16 U/L (ref 0–35)
AST: 21 U/L (ref 0–37)
Albumin: 4.1 g/dL (ref 3.5–5.2)
Alkaline Phosphatase: 159 U/L — ABNORMAL HIGH (ref 39–117)
Bilirubin, Direct: 0 mg/dL (ref 0.0–0.3)
Total Bilirubin: 0.4 mg/dL (ref 0.2–1.2)
Total Protein: 7.7 g/dL (ref 6.0–8.3)

## 2017-06-05 ENCOUNTER — Other Ambulatory Visit: Payer: Self-pay | Admitting: Emergency Medicine

## 2017-06-05 MED ORDER — OMEPRAZOLE 40 MG PO CPDR: 40.0000 mg | DELAYED_RELEASE_CAPSULE | Freq: Every day | ORAL | 2 refills | Status: DC

## 2017-06-05 NOTE — Telephone Encounter (Signed)
Pts pharmacy requested 90 day refill for Omeprazole. Refill sent to pharmacy.

## 2017-06-23 ENCOUNTER — Other Ambulatory Visit: Payer: Self-pay | Admitting: Internal Medicine

## 2017-06-30 ENCOUNTER — Encounter: Payer: 59 | Admitting: Internal Medicine

## 2017-07-20 ENCOUNTER — Encounter: Payer: 59 | Admitting: Internal Medicine

## 2017-07-25 ENCOUNTER — Other Ambulatory Visit: Payer: Self-pay | Admitting: Internal Medicine

## 2017-08-12 ENCOUNTER — Ambulatory Visit (INDEPENDENT_AMBULATORY_CARE_PROVIDER_SITE_OTHER): Payer: 59 | Admitting: Internal Medicine

## 2017-08-12 ENCOUNTER — Encounter: Payer: Self-pay | Admitting: Internal Medicine

## 2017-08-12 VITALS — BP 126/86 | HR 87 | Temp 98.2°F | Resp 16 | Ht 63.0 in | Wt 147.0 lb

## 2017-08-12 DIAGNOSIS — R5383 Other fatigue: Secondary | ICD-10-CM | POA: Diagnosis not present

## 2017-08-12 DIAGNOSIS — J45990 Exercise induced bronchospasm: Secondary | ICD-10-CM | POA: Insufficient documentation

## 2017-08-12 DIAGNOSIS — E039 Hypothyroidism, unspecified: Secondary | ICD-10-CM

## 2017-08-12 DIAGNOSIS — Z0000 Encounter for general adult medical examination without abnormal findings: Secondary | ICD-10-CM

## 2017-08-12 DIAGNOSIS — K9041 Non-celiac gluten sensitivity: Secondary | ICD-10-CM

## 2017-08-12 DIAGNOSIS — N760 Acute vaginitis: Secondary | ICD-10-CM

## 2017-08-12 DIAGNOSIS — Z13820 Encounter for screening for osteoporosis: Secondary | ICD-10-CM | POA: Diagnosis not present

## 2017-08-12 DIAGNOSIS — G43809 Other migraine, not intractable, without status migrainosus: Secondary | ICD-10-CM | POA: Diagnosis not present

## 2017-08-12 DIAGNOSIS — E2839 Other primary ovarian failure: Secondary | ICD-10-CM | POA: Diagnosis not present

## 2017-08-12 DIAGNOSIS — B9689 Other specified bacterial agents as the cause of diseases classified elsewhere: Secondary | ICD-10-CM | POA: Diagnosis not present

## 2017-08-12 MED ORDER — ALBUTEROL SULFATE HFA 108 (90 BASE) MCG/ACT IN AERS: 2.0000 | INHALATION_SPRAY | Freq: Four times a day (QID) | RESPIRATORY_TRACT | 8 refills | Status: DC | PRN

## 2017-08-12 MED ORDER — METRONIDAZOLE 500 MG PO TABS: 500.0000 mg | ORAL_TABLET | Freq: Three times a day (TID) | ORAL | 0 refills | Status: DC

## 2017-08-12 NOTE — Assessment & Plan Note (Signed)
Spurring is seen significant fatigue-somewhat chronic, but more acute after CMV infection in September 2018 We will check basic blood work Continue supplementation Continue regular exercise

## 2017-08-12 NOTE — Patient Instructions (Addendum)
Schedule a dermatology appointment and GYN visit. Schedule your mammogram and dexa scan.    Test(s) ordered today. Your results will be released to [place] (or called to you) after review, usually within 72hours after test completion. If any changes need to be made, you will be notified at that same time.  All other Health Maintenance issues reviewed.   All recommended immunizations and age-appropriate screenings are up-to-date or discussed.  No immunizations administered today.   Medications reviewed and updated.  Changes include starting an antibiotic for the vaginal infection.   Your prescription(s) have been submitted to your pharmacy. Please take as directed and contact our office if you believe you are having problem(s) with the medication(s).  Please followup in one year   Health Maintenance, Female Adopting a healthy lifestyle and getting preventive care can go a long way to promote health and wellness. Talk with your health care provider about what schedule of regular examinations is right for you. This is a good chance for you to check in with your provider about disease prevention and staying healthy. In between checkups, there are plenty of things you can do on your own. Experts have done a lot of research about which lifestyle changes and preventive measures are most likely to keep you healthy. Ask your health care provider for more information. Weight and diet Eat a healthy diet  Be sure to include plenty of vegetables, fruits, low-fat dairy products, and lean protein.  Do not eat a lot of foods high in solid fats, added sugars, or salt.  Get regular exercise. This is one of the most important things you can do for your health. ? Most adults should exercise for at least 150 minutes each week. The exercise should increase your heart rate and make you sweat (moderate-intensity exercise). ? Most adults should also do strengthening exercises at least twice a week. This is in  addition to the moderate-intensity exercise.  Maintain a healthy weight  Body mass index (BMI) is a measurement that can be used to identify possible weight problems. It estimates body fat based on height and weight. Your health care provider can help determine your BMI and help you achieve or maintain a healthy weight.  For females 8 years of age and older: ? A BMI below 18.5 is considered underweight. ? A BMI of 18.5 to 24.9 is normal. ? A BMI of 25 to 29.9 is considered overweight. ? A BMI of 30 and above is considered obese.  Watch levels of cholesterol and blood lipids  You should start having your blood tested for lipids and cholesterol at 56 years of age, then have this test every 5 years.  You may need to have your cholesterol levels checked more often if: ? Your lipid or cholesterol levels are high. ? You are older than 56 years of age. ? You are at high risk for heart disease.  Cancer screening Lung Cancer  Lung cancer screening is recommended for adults 32-32 years old who are at high risk for lung cancer because of a history of smoking.  A yearly low-dose CT scan of the lungs is recommended for people who: ? Currently smoke. ? Have quit within the past 15 years. ? Have at least a 30-pack-year history of smoking. A pack year is smoking an average of one pack of cigarettes a day for 1 year.  Yearly screening should continue until it has been 15 years since you quit.  Yearly screening should stop if you develop  a health problem that would prevent you from having lung cancer treatment.  Breast Cancer  Practice breast self-awareness. This means understanding how your breasts normally appear and feel.  It also means doing regular breast self-exams. Let your health care provider know about any changes, no matter how small.  If you are in your 20s or 30s, you should have a clinical breast exam (CBE) by a health care provider every 1-3 years as part of a regular health  exam.  If you are 1 or older, have a CBE every year. Also consider having a breast X-ray (mammogram) every year.  If you have a family history of breast cancer, talk to your health care provider about genetic screening.  If you are at high risk for breast cancer, talk to your health care provider about having an MRI and a mammogram every year.  Breast cancer gene (BRCA) assessment is recommended for women who have family members with BRCA-related cancers. BRCA-related cancers include: ? Breast. ? Ovarian. ? Tubal. ? Peritoneal cancers.  Results of the assessment will determine the need for genetic counseling and BRCA1 and BRCA2 testing.  Cervical Cancer Your health care provider may recommend that you be screened regularly for cancer of the pelvic organs (ovaries, uterus, and vagina). This screening involves a pelvic examination, including checking for microscopic changes to the surface of your cervix (Pap test). You may be encouraged to have this screening done every 3 years, beginning at age 35.  For women ages 66-65, health care providers may recommend pelvic exams and Pap testing every 3 years, or they may recommend the Pap and pelvic exam, combined with testing for human papilloma virus (HPV), every 5 years. Some types of HPV increase your risk of cervical cancer. Testing for HPV may also be done on women of any age with unclear Pap test results.  Other health care providers may not recommend any screening for nonpregnant women who are considered low risk for pelvic cancer and who do not have symptoms. Ask your health care provider if a screening pelvic exam is right for you.  If you have had past treatment for cervical cancer or a condition that could lead to cancer, you need Pap tests and screening for cancer for at least 20 years after your treatment. If Pap tests have been discontinued, your risk factors (such as having a new sexual partner) need to be reassessed to determine if  screening should resume. Some women have medical problems that increase the chance of getting cervical cancer. In these cases, your health care provider may recommend more frequent screening and Pap tests.  Colorectal Cancer  This type of cancer can be detected and often prevented.  Routine colorectal cancer screening usually begins at 56 years of age and continues through 56 years of age.  Your health care provider may recommend screening at an earlier age if you have risk factors for colon cancer.  Your health care provider may also recommend using home test kits to check for hidden blood in the stool.  A small camera at the end of a tube can be used to examine your colon directly (sigmoidoscopy or colonoscopy). This is done to check for the earliest forms of colorectal cancer.  Routine screening usually begins at age 73.  Direct examination of the colon should be repeated every 5-10 years through 56 years of age. However, you may need to be screened more often if early forms of precancerous polyps or small growths are found.  Skin Cancer  Check your skin from head to toe regularly.  Tell your health care provider about any new moles or changes in moles, especially if there is a change in a mole's shape or color.  Also tell your health care provider if you have a mole that is larger than the size of a pencil eraser.  Always use sunscreen. Apply sunscreen liberally and repeatedly throughout the day.  Protect yourself by wearing long sleeves, pants, a wide-brimmed hat, and sunglasses whenever you are outside.  Heart disease, diabetes, and high blood pressure  High blood pressure causes heart disease and increases the risk of stroke. High blood pressure is more likely to develop in: ? People who have blood pressure in the high end of the normal range (130-139/85-89 mm Hg). ? People who are overweight or obese. ? People who are African American.  If you are 81-18 years of age, have  your blood pressure checked every 3-5 years. If you are 46 years of age or older, have your blood pressure checked every year. You should have your blood pressure measured twice-once when you are at a hospital or clinic, and once when you are not at a hospital or clinic. Record the average of the two measurements. To check your blood pressure when you are not at a hospital or clinic, you can use: ? An automated blood pressure machine at a pharmacy. ? A home blood pressure monitor.  If you are between 56 years and 52 years old, ask your health care provider if you should take aspirin to prevent strokes.  Have regular diabetes screenings. This involves taking a blood sample to check your fasting blood sugar level. ? If you are at a normal weight and have a low risk for diabetes, have this test once every three years after 56 years of age. ? If you are overweight and have a high risk for diabetes, consider being tested at a younger age or more often. Preventing infection Hepatitis B  If you have a higher risk for hepatitis B, you should be screened for this virus. You are considered at high risk for hepatitis B if: ? You were born in a country where hepatitis B is common. Ask your health care provider which countries are considered high risk. ? Your parents were born in a high-risk country, and you have not been immunized against hepatitis B (hepatitis B vaccine). ? You have HIV or AIDS. ? You use needles to inject street drugs. ? You live with someone who has hepatitis B. ? You have had sex with someone who has hepatitis B. ? You get hemodialysis treatment. ? You take certain medicines for conditions, including cancer, organ transplantation, and autoimmune conditions.  Hepatitis C  Blood testing is recommended for: ? Everyone born from [birth year] through 1964. ? Anyone with known risk factors for hepatitis C.  Sexually transmitted infections (STIs)  You should be screened for sexually  transmitted infections (STIs) including gonorrhea and chlamydia if: ? You are sexually active and are younger than 56 years of age. ? You are older than 56 years of age and your health care provider tells you that you are at risk for this type of infection. ? Your sexual activity has changed since you were last screened and you are at an increased risk for chlamydia or gonorrhea. Ask your health care provider if you are at risk.  If you do not have HIV, but are at risk, it may be recommended that you  take a prescription medicine daily to prevent HIV infection. This is called pre-exposure prophylaxis (PrEP). You are considered at risk if: ? You are sexually active and do not regularly use condoms or know the HIV status of your partner(s). ? You take drugs by injection. ? You are sexually active with a partner who has HIV.  Talk with your health care provider about whether you are at high risk of being infected with HIV. If you choose to begin PrEP, you should first be tested for HIV. You should then be tested every 3 months for as long as you are taking PrEP. Pregnancy  If you are premenopausal and you may become pregnant, ask your health care provider about preconception counseling.  If you may become pregnant, take 400 to 800 micrograms (mcg) of folic acid every day.  If you want to prevent pregnancy, talk to your health care provider about birth control (contraception). Osteoporosis and menopause  Osteoporosis is a disease in which the bones lose minerals and strength with aging. This can result in serious bone fractures. Your risk for osteoporosis can be identified using a bone density scan.  If you are 36 years of age or older, or if you are at risk for osteoporosis and fractures, ask your health care provider if you should be screened.  Ask your health care provider whether you should take a calcium or vitamin D supplement to lower your risk for osteoporosis.  Menopause may have  certain physical symptoms and risks.  Hormone replacement therapy may reduce some of these symptoms and risks. Talk to your health care provider about whether hormone replacement therapy is right for you. Follow these instructions at home:  Schedule regular health, dental, and eye exams.  Stay current with your immunizations.  Do not use any tobacco products including cigarettes, chewing tobacco, or electronic cigarettes.  If you are pregnant, do not drink alcohol.  If you are breastfeeding, limit how much and how often you drink alcohol.  Limit alcohol intake to no more than 1 drink per day for nonpregnant women. One drink equals 12 ounces of beer, 5 ounces of wine, or 1 ounces of hard liquor.  Do not use street drugs.  Do not share needles.  Ask your health care provider for help if you need support or information about quitting drugs.  Tell your health care provider if you often feel depressed.  Tell your health care provider if you have ever been abused or do not feel safe at home. This information is not intended to replace advice given to you by your health care provider. Make sure you discuss any questions you have with your health care provider. Document Released: 01/20/2011 Document Revised: 12/13/2015 Document Reviewed: 04/10/2015 Elsevier Interactive Patient Education  [employer].

## 2017-08-12 NOTE — Progress Notes (Signed)
Subjective:    Patient ID: Melissa Kirby, female    DOB: 03/05/1961, 57 y.o.   MRN: [id number]  HPI She is here for a physical exam.   She always feels tired, foggy headed, can sleep all the time.  She has always felt this way since being diagnosed with hashimoto's in her 38s, but after her CMV infection in September of 2018 she feels 10 times worse.  She wonders if she should go on a thyroid diet or take some other supplements she is not taking.  She is currently taking calcium, magnesium, vitamin B12 and vitamin D supplementation.  It is not helped her feel better.   She has chest tightness and wheezing along with some shortness of breath when she overexerts herself or is walking/hiking up an incline.  For exercise she has been hiking on the weekends.  She has used her inhaler and this helps and she wondered if it was exercise-induced asthma.  She has noticed this since October when the weather has been colder.     Medications and allergies reviewed with patient and updated if appropriate.  Patient Active Problem List   Diagnosis Date Noted  . Myalgia 04/06/2017  . Abnormal results of liver function studies 04/06/2017  . Fever 03/30/2017  . Elevated liver enzymes 03/30/2017  . Chest pain 03/30/2017  . Acute non intractable tension-type headache 03/24/2017  . Rib pain on left side 09/02/2016  . SOB (shortness of breath) 09/02/2016  . Cough 08/20/2016  . LLQ pain 05/05/2016  . Intracranial hypotension 01/24/2014  . Vitamin D deficiency   . Abnormal brain MRI 10/15/2013  . Elevated blood pressure reading 10/13/2013  . LBP (low back pain) 05/10/2012  . Fibromyalgia   . Fatigue 01/17/2011  . Atrophic vaginitis 10/17/2010  . Depression 10/17/2010  . Vitamin B12 deficiency (non anemic) 10/04/2009  . Hypothyroidism 10/03/2009  . Migraine headache 10/03/2009    Current Outpatient Medications on File Prior to Visit  Medication Sig Dispense Refill  . levothyroxine (SYNTHROID,  LEVOTHROID) 125 MCG tablet Take 1 tablet (125 mcg total) by mouth daily. 90 tablet 3  . nortriptyline (PAMELOR) 50 MG capsule TAKE 2 CAPSULES (100 MG TOTAL) BY MOUTH AT BEDTIME. 180 capsule 0  . omeprazole (PRILOSEC) 40 MG capsule Take 1 capsule (40 mg total) daily by mouth. 30-60 mins before breakfast 90 capsule 2   No current facility-administered medications on file prior to visit.     Past Medical History:  Diagnosis Date  . ANEMIA, IRON DEFICIENCY   . Cerebrospinal fluid leak from spinal puncture    improved with 7th patch at [facility] 05/2014  . Depression    therapy  . Fibromyalgia 1999   no meds  . GASTRITIS 11/12/2009   Qualifier: Diagnosis of  By: Theadora Rama RN, Silvio Pate    . GERD (gastroesophageal reflux disease)    per endo. - does not take any meds.    . Kidney stones 1998, 2005   x 2  . MIGRAINE HEADACHE    last Migraine 06/29/2011-   . Unspecified hypothyroidism 1982/2000   surgical resection of benign growth - partial then complete  . VITAMIN B12 DEFICIENCY 09/2009 dx    Past Surgical History:  Procedure Laterality Date  . CHOLECYSTECTOMY  2009  . DIAGNOSTIC LAPAROSCOPY  1982   diagnostic  . POSTERIOR FUSION LUMBAR SPINE  06/2011  . THYROID SURGERY  1983   partial   . TOTAL THYROIDECTOMY  2000   Had thyroid nodule,  nonmalignant ([id number]) resection  . TUBAL LIGATION  1989    Social History   Socioeconomic History  . Marital status: Married    Spouse name: None  . Number of children: 3  . Years of education: None  . Highest education level: None  Social Needs  . Financial resource strain: None  . Food insecurity - worry: None  . Food insecurity - inability: None  . Transportation needs - medical: None  . Transportation needs - non-medical: None  Occupational History  . None  Tobacco Use  . Smoking status: Never Smoker  . Smokeless tobacco: Never Used  Substance and Sexual Activity  . Alcohol use: No    Alcohol/week: 0.0 oz  . Drug use: No  . Sexual  activity: Yes    Birth control/protection: Surgical    Comment: Married lives with spouse, 3 children. employed at [employer], swing shift  Other Topics Concern  . None  Social History Narrative   Ongoing divorce from husband, separated since 05/2014. Lives alone     Works at [employer].  Education: high school 12th grade.      Exercises regularly    Family History  Problem Relation Age of Onset  . Hyperlipidemia Mother   . Heart disease Mother 1264       massive MI  . Hyperlipidemia Father   . COPD Father        severe, smoker  . Breast cancer Maternal Grandmother   . Hyperlipidemia Maternal Grandmother   . Coronary artery disease Maternal Grandmother 78       CABG  . Ovarian cancer Other   . Anesthesia problems Neg Hx   . Malignant hyperthermia Neg Hx   . Pseudochol deficiency Neg Hx   . Hypotension Neg Hx     Review of Systems  Constitutional: Positive for fatigue. Negative for appetite change, chills and fever.  Eyes: Negative for visual disturbance.  Respiratory: Positive for chest tightness (with moderate exertion). Negative for cough, shortness of breath and wheezing.   Cardiovascular: Negative for chest pain, palpitations and leg swelling.  Gastrointestinal: Positive for constipation (occ). Negative for abdominal pain, anal bleeding, blood in stool, diarrhea and nausea.       No gerd  Genitourinary: Positive for vaginal discharge (minimal, mild odor). Negative for dysuria and hematuria.       No vaginal/vulvar itch  Musculoskeletal: Positive for back pain (s/p back surgery). Negative for arthralgias.  Skin: Positive for color change. Negative for rash.  Neurological: Positive for headaches. Negative for light-headedness.  Psychiatric/Behavioral: Negative for dysphoric mood. The patient is not nervous/anxious.       Objective:   Vitals:   08/12/17 1449  BP: 126/86  Pulse: 87  Resp: 16  Temp: 98.2 F (36.8 C)  SpO2: 97%   Filed Weights     08/12/17 1449  Weight: 147 lb (66.7 kg)   Body mass index is 26.04 kg/m.  Wt Readings from Last 3 Encounters:  08/12/17 147 lb (66.7 kg)  04/07/17 147 lb (66.7 kg)  04/03/17 145 lb (65.8 kg)     Physical Exam Constitutional: She appears well-developed and well-nourished. No distress.  HENT:  Head: Normocephalic and atraumatic.  Right Ear: External ear normal. Normal ear canal and TM Left Ear: External ear normal.  Normal ear canal and TM Mouth/Throat: Oropharynx is clear and moist.  Eyes: Conjunctivae and EOM are normal.  Neck: Neck supple. No tracheal deviation present. No thyromegaly present.  No  carotid bruit  Cardiovascular: Normal rate, regular rhythm and normal heart sounds.   No murmur heard.  No edema. Pulmonary/Chest: Effort normal and breath sounds normal. No respiratory distress. She has no wheezes. She has no rales.  Breast: deferred   Abdominal: Soft. She exhibits no distension. There is no tenderness.  Lymphadenopathy: She has no cervical adenopathy.  Skin: Skin is warm and dry. She is not diaphoretic.  Psychiatric: She has a normal mood and affect. Her behavior is normal.        Assessment & Plan:   Physical exam: Screening blood work  ordered Immunizations  Discussed shingrix, others up to date Colonoscopy   Up to date  Mammogram  No up to date - will schedule Gyn - will establish dexa - ordered - will get done at breast center Eye exams  Up to date  Exercise  Hiking on weekends Weight  BMI is good. Skin  Couple dry bumps - recommended skin test Substance abuse  none  See Problem List for Assessment and Plan of chronic medical problems.    Follow-up annually, sooner if needed

## 2017-08-12 NOTE — Assessment & Plan Note (Signed)
Overall migraines controlled Continue current dose of nortriptyline

## 2017-08-12 NOTE — Assessment & Plan Note (Signed)
The chest tightness, heaviness and shortness of breath with exertion that is relieved with an inhaler sounds like exercise-induced asthma Continue inhaler prior to exercise or as needed Inhaler refilled

## 2017-08-12 NOTE — Assessment & Plan Note (Signed)
She had evidence of an infection back in September, will was asymptomatic at this time was not treated Currently having mild vaginal discharge and odor We will treat with Flagyl 500 mg 3 times daily times 7 days Will establish with gynecology

## 2017-08-12 NOTE — Assessment & Plan Note (Signed)
Feels fatigued, foggy headed and could sleep all the time-concerned this may be related to her thyroid Status post complete thyroidectomy in her 3420s Taking her medication daily  Wondered about extensive thyroid testing We will check TSH, thyroid antibodies, free T4, free T3 Can consider endocrine referral if needed/wanted

## 2017-08-14 ENCOUNTER — Inpatient Hospital Stay: Admission: RE | Admit: 2017-08-14 | Payer: 59 | Source: Ambulatory Visit

## 2017-08-17 ENCOUNTER — Other Ambulatory Visit (INDEPENDENT_AMBULATORY_CARE_PROVIDER_SITE_OTHER): Payer: 59

## 2017-08-17 ENCOUNTER — Ambulatory Visit (INDEPENDENT_AMBULATORY_CARE_PROVIDER_SITE_OTHER)
Admission: RE | Admit: 2017-08-17 | Discharge: 2017-08-17 | Disposition: A | Discharge status: Home / Self Care | Payer: 59 | Source: Ambulatory Visit | Attending: Internal Medicine | Admitting: Internal Medicine

## 2017-08-17 DIAGNOSIS — E2839 Other primary ovarian failure: Secondary | ICD-10-CM

## 2017-08-17 DIAGNOSIS — E039 Hypothyroidism, unspecified: Secondary | ICD-10-CM

## 2017-08-17 DIAGNOSIS — R5383 Other fatigue: Secondary | ICD-10-CM

## 2017-08-17 DIAGNOSIS — Z0000 Encounter for general adult medical examination without abnormal findings: Secondary | ICD-10-CM | POA: Diagnosis not present

## 2017-08-17 DIAGNOSIS — Z13820 Encounter for screening for osteoporosis: Secondary | ICD-10-CM

## 2017-08-17 LAB — TSH: TSH: 1.1 u[IU]/mL (ref 0.35–4.50)

## 2017-08-17 LAB — CBC WITH DIFFERENTIAL/PLATELET
Basophils Absolute: 0.1 10*3/uL (ref 0.0–0.1)
Basophils Relative: 0.9 % (ref 0.0–3.0)
Eosinophils Absolute: 0.3 10*3/uL (ref 0.0–0.7)
Eosinophils Relative: 4.7 % (ref 0.0–5.0)
HCT: 37.7 % (ref 36.0–46.0)
Hemoglobin: 12.5 g/dL (ref 12.0–15.0)
Lymphocytes Relative: 52.1 % — ABNORMAL HIGH (ref 12.0–46.0)
Lymphs Abs: 2.9 10*3/uL (ref 0.7–4.0)
MCHC: 33.2 g/dL (ref 30.0–36.0)
MCV: 88.6 fl (ref 78.0–100.0)
Monocytes Absolute: 0.6 10*3/uL (ref 0.1–1.0)
Monocytes Relative: 11.2 % (ref 3.0–12.0)
Neutro Abs: 1.7 10*3/uL (ref 1.4–7.7)
Neutrophils Relative %: 31.1 % — ABNORMAL LOW (ref 43.0–77.0)
Platelets: 256 10*3/uL (ref 150.0–400.0)
RBC: 4.26 Mil/uL (ref 3.87–5.11)
RDW: 13.1 % (ref 11.5–15.5)
WBC: 5.5 10*3/uL (ref 4.0–10.5)

## 2017-08-17 LAB — LIPID PANEL
Cholesterol: 191 mg/dL (ref 0–200)
HDL: 42.9 mg/dL (ref >39.00)
LDL Cholesterol: 118 mg/dL — ABNORMAL HIGH (ref 0–99)
NonHDL: 147.6
Total CHOL/HDL Ratio: 4
Triglycerides: 147 mg/dL (ref 0.0–149.0)
VLDL: 29.4 mg/dL (ref 0.0–40.0)

## 2017-08-17 LAB — COMPREHENSIVE METABOLIC PANEL
ALT: 18 U/L (ref 0–35)
AST: 25 U/L (ref 0–37)
Albumin: 4.1 g/dL (ref 3.5–5.2)
Alkaline Phosphatase: 157 U/L — ABNORMAL HIGH (ref 39–117)
BUN: 4 mg/dL — ABNORMAL LOW (ref 6–23)
CO2: 26 mEq/L (ref 19–32)
Calcium: 9.5 mg/dL (ref 8.4–10.5)
Chloride: 105 mEq/L (ref 96–112)
Creatinine, Ser: 0.9 mg/dL (ref 0.40–1.20)
GFR: 69 mL/min (ref >60.00)
Glucose, Bld: 95 mg/dL (ref 70–99)
Potassium: 4.5 mEq/L (ref 3.5–5.1)
Sodium: 140 mEq/L (ref 135–145)
Total Bilirubin: 0.4 mg/dL (ref 0.2–1.2)
Total Protein: 7.8 g/dL (ref 6.0–8.3)

## 2017-08-17 LAB — T4, FREE: Free T4: 1.11 ng/dL (ref 0.60–1.60)

## 2017-08-17 LAB — T3, FREE: T3, Free: 3.2 pg/mL (ref 2.3–4.2)

## 2017-08-18 LAB — THYROID ANTIBODIES
Thyroglobulin Ab: <1 IU/mL (ref <=1)
Thyroperoxidase Ab SerPl-aCnc: 1 IU/mL (ref <9)

## 2017-08-19 ENCOUNTER — Encounter: Payer: Self-pay | Admitting: Internal Medicine

## 2017-08-19 DIAGNOSIS — M8580 Other specified disorders of bone density and structure, unspecified site: Secondary | ICD-10-CM | POA: Insufficient documentation

## 2017-08-24 ENCOUNTER — Other Ambulatory Visit: Payer: Self-pay | Admitting: Emergency Medicine

## 2017-08-24 MED ORDER — LEVOTHYROXINE SODIUM 125 MCG PO TABS: 125.0000 ug | ORAL_TABLET | Freq: Every day | ORAL | 3 refills | Status: DC

## 2017-09-17 NOTE — Progress Notes (Signed)
Subjective:    Patient ID: Melissa Kirby, female    DOB: 11-12-1960, 57 y.o.   MRN: [id number]  HPI She is here for an acute visit for cold symptoms.  Her symptoms started one week ago.   She is experiencing headaches, nasal congestion, sinus pain/pressure, sore throat on left side, cough, pain in left upper back with coughing.    She denies fever, chills, GI symptoms, sob and wheeze.  She has taken multiple otc cold medications.  Nothing has helped.    Medications and allergies reviewed with patient and updated if appropriate.  Patient Active Problem List   Diagnosis Date Noted  . Osteopenia 08/19/2017  . Gluten intolerance 08/12/2017  . Exercise-induced asthma 08/12/2017  . Bacterial vaginitis 08/12/2017  . Abnormal results of liver function studies 04/06/2017  . Elevated liver enzymes 03/30/2017  . Acute non intractable tension-type headache 03/24/2017  . Intracranial hypotension 01/24/2014  . Vitamin D deficiency   . Abnormal brain MRI 10/15/2013  . LBP (low back pain) 05/10/2012  . Fibromyalgia   . Fatigue 01/17/2011  . Atrophic vaginitis 10/17/2010  . Depression 10/17/2010  . Vitamin B12 deficiency (non anemic) 10/04/2009  . Hypothyroidism 10/03/2009  . Migraine headache 10/03/2009    Current Outpatient Medications on File Prior to Visit  Medication Sig Dispense Refill  . albuterol (PROVENTIL HFA;VENTOLIN HFA) 108 (90 Base) MCG/ACT inhaler Inhale 2 puffs into the lungs every 6 (six) hours as needed for wheezing or shortness of breath. 1 Inhaler 8  . levothyroxine (SYNTHROID, LEVOTHROID) 125 MCG tablet Take 1 tablet (125 mcg total) by mouth daily. 90 tablet 3  . metroNIDAZOLE (FLAGYL) 500 MG tablet Take 1 tablet (500 mg total) by mouth 3 (three) times daily. 21 tablet 0  . nortriptyline (PAMELOR) 50 MG capsule TAKE 2 CAPSULES (100 MG TOTAL) BY MOUTH AT BEDTIME. 180 capsule 0  . omeprazole (PRILOSEC) 40 MG capsule Take 1 capsule (40 mg total) daily by mouth. 30-60  mins before breakfast 90 capsule 2   No current facility-administered medications on file prior to visit.     Past Medical History:  Diagnosis Date  . ANEMIA, IRON DEFICIENCY   . Cerebrospinal fluid leak from spinal puncture    improved with 7th patch at [facility] 05/2014  . Depression    therapy  . Fibromyalgia 1999   no meds  . GASTRITIS 11/12/2009   Qualifier: Diagnosis of  By: Theadora Ramaellis RN, Silvio PateShelia    . GERD (gastroesophageal reflux disease)    per endo. - does not take any meds.    . Kidney stones 1998, 2005   x 2  . MIGRAINE HEADACHE    last Migraine 06/29/2011-   . Unspecified hypothyroidism 1982/2000   surgical resection of benign growth - partial then complete  . VITAMIN B12 DEFICIENCY 09/2009 dx    Past Surgical History:  Procedure Laterality Date  . CHOLECYSTECTOMY  2009  . DIAGNOSTIC LAPAROSCOPY  1982   diagnostic  . POSTERIOR FUSION LUMBAR SPINE  06/2011  . THYROID SURGERY  1983   partial   . TOTAL THYROIDECTOMY  2000   Had thyroid nodule, nonmalignant (4098-1191(1982-2000) resection  . TUBAL LIGATION  1989    Social History   Socioeconomic History  . Marital status: Married    Spouse name: None  . Number of children: 3  . Years of education: None  . Highest education level: None  Social Needs  . Financial resource strain: None  . Food insecurity - worry:  None  . Food insecurity - inability: None  . Transportation needs - medical: None  . Transportation needs - non-medical: None  Occupational History  . None  Tobacco Use  . Smoking status: Never Smoker  . Smokeless tobacco: Never Used  Substance and Sexual Activity  . Alcohol use: No    Alcohol/week: 0.0 oz  . Drug use: No  . Sexual activity: Yes    Birth control/protection: Surgical    Comment: Married lives with spouse, 3 children. 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 None  Social History Narrative   Ongoing divorce from husband, separated since 05/2014. Lives alone      Works at [employer] and [employer].  Education: high school 12th grade.      Exercises regularly    Family History  Problem Relation Age of Onset  . Hyperlipidemia Mother   . Heart disease Mother 32       massive MI  . Hyperlipidemia Father   . COPD Father        severe, smoker  . Breast cancer Maternal Grandmother   . Hyperlipidemia Maternal Grandmother   . Coronary artery disease Maternal Grandmother 78       CABG  . Ovarian cancer Other   . Anesthesia problems Neg Hx   . Malignant hyperthermia Neg Hx   . Pseudochol deficiency Neg Hx   . Hypotension Neg Hx     Review of Systems  Constitutional: Negative for chills and fever.  HENT: Positive for congestion, sinus pressure, sinus pain and sore throat (left side of throat). Negative for ear pain.   Respiratory: Positive for cough. Negative for shortness of breath and wheezing.   Cardiovascular: Negative for chest pain (with deep breaths on left upper side in back).  Gastrointestinal: Negative for diarrhea and nausea.  Musculoskeletal: Negative for myalgias.  Neurological: Positive for headaches.       Objective:   Vitals:   09/18/17 0808  BP: 124/84  Pulse: (!) 108  Resp: 16  Temp: 98 F (36.7 C)  SpO2: 98%   Filed Weights   09/18/17 0808  Weight: 146 lb (66.2 kg)   Body mass index is 25.86 kg/m.  Wt Readings from Last 3 Encounters:  09/18/17 146 lb (66.2 kg)  08/12/17 147 lb (66.7 kg)  04/07/17 147 lb (66.7 kg)     Physical Exam GENERAL APPEARANCE: Appears stated age, well appearing, NAD EYES: conjunctiva clear, no icterus HEENT: bilateral tympanic membranes and ear canals normal, oropharynx with mild erythema, no thyromegaly, trachea midline, no cervical or supraclavicular lymphadenopathy LUNGS: Clear to auscultation without wheeze or crackles, unlabored breathing, good air entry bilaterally; pain in left upper back with deep breaths CARDIOVASCULAR: Normal S1,S2 without murmurs, no edema SKIN: warm,  dry        Assessment & Plan:   See Problem List for Assessment and Plan of chronic medical problems.

## 2017-09-18 ENCOUNTER — Encounter: Payer: Self-pay | Admitting: Internal Medicine

## 2017-09-18 ENCOUNTER — Ambulatory Visit: Payer: 59 | Admitting: Internal Medicine

## 2017-09-18 ENCOUNTER — Ambulatory Visit (INDEPENDENT_AMBULATORY_CARE_PROVIDER_SITE_OTHER)
Admission: RE | Admit: 2017-09-18 | Discharge: 2017-09-18 | Disposition: A | Discharge status: Home / Self Care | Payer: 59 | Source: Ambulatory Visit | Attending: Internal Medicine | Admitting: Internal Medicine

## 2017-09-18 VITALS — BP 124/84 | HR 108 | Temp 98.0°F | Resp 16 | Wt 146.0 lb

## 2017-09-18 DIAGNOSIS — R05 Cough: Secondary | ICD-10-CM

## 2017-09-18 DIAGNOSIS — R0781 Pleurodynia: Secondary | ICD-10-CM | POA: Insufficient documentation

## 2017-09-18 DIAGNOSIS — J0100 Acute maxillary sinusitis, unspecified: Secondary | ICD-10-CM

## 2017-09-18 DIAGNOSIS — R059 Cough, unspecified: Secondary | ICD-10-CM

## 2017-09-18 DIAGNOSIS — J019 Acute sinusitis, unspecified: Secondary | ICD-10-CM | POA: Insufficient documentation

## 2017-09-18 DIAGNOSIS — R079 Chest pain, unspecified: Secondary | ICD-10-CM | POA: Diagnosis not present

## 2017-09-18 MED ORDER — HYDROCOD POLST-CPM POLST ER 10-8 MG/5ML PO SUER: 5.0000 mL | Freq: Two times a day (BID) | ORAL | 0 refills | Status: DC | PRN

## 2017-09-18 MED ORDER — DOXYCYCLINE HYCLATE 100 MG PO TABS: 100.0000 mg | ORAL_TABLET | Freq: Two times a day (BID) | ORAL | 0 refills | Status: DC

## 2017-09-18 NOTE — Assessment & Plan Note (Signed)
Some of her symptoms are consistent with a sinus infection Start doxycycline Discussed over-the-counter cold medications for symptom relief Call if no improvement

## 2017-09-18 NOTE — Patient Instructions (Signed)
Take the antibiotic -doxycycline, as prescribed.  Use the cough syrup as prescribed.  Continue over the counter cold medications.  Continue increased rest and fluids.   Have a chest xray today.

## 2017-09-18 NOTE — Assessment & Plan Note (Signed)
Having left posterior back/lung pain with deep breaths-may be pleuritic in nature related to an infection or strain from coughing Will get chest x-ray to rule out pneumonia Has a sinus infection and may have bronchitis, possible pneumonia We will treat with antibiotics-doxycycline Tussionex cough syrup Increase rest and fluids Can use inhaler as needed Over-the-counter cold medications Call if no improvement

## 2017-09-18 NOTE — Assessment & Plan Note (Signed)
Will get chest x-ray to rule out pneumonia given her pleuritic chest pain Has a sinus infection and may have bronchitis, possible pneumonia We will treat with antibiotics-doxycycline Tussionex cough syrup Increase rest and fluids Can use inhaler as needed Over-the-counter cold medications Call if no improvement

## 2017-10-02 ENCOUNTER — Other Ambulatory Visit: Payer: Self-pay | Admitting: Internal Medicine

## 2017-10-12 ENCOUNTER — Ambulatory Visit: Payer: 59 | Admitting: Internal Medicine

## 2017-10-12 ENCOUNTER — Encounter: Payer: Self-pay | Admitting: Internal Medicine

## 2017-10-12 VITALS — BP 140/86 | HR 112 | Temp 98.7°F | Resp 16 | Wt 147.0 lb

## 2017-10-12 DIAGNOSIS — L237 Allergic contact dermatitis due to plants, except food: Secondary | ICD-10-CM | POA: Diagnosis not present

## 2017-10-12 MED ORDER — PREDNISONE 10 MG PO TABS: ORAL_TABLET | ORAL | 0 refills | Status: DC

## 2017-10-12 MED ORDER — METHYLPREDNISOLONE ACETATE 80 MG/ML IJ SUSP
80.0000 mg | Freq: Once | INTRAMUSCULAR | Status: AC
  Administered 2017-10-12: 80 mg via INTRAMUSCULAR

## 2017-10-12 NOTE — Patient Instructions (Signed)
You received a steroid injection today.   Start the oral steroids tomorrow.   You can continue the benadryl and calamine lotion if they are helping.    Call if no improvement     Poison Oak Dermatitis Poison oak dermatitis is inflammation of the skin that is caused by contact with the allergens on the leaves of the poison oak (toxicodendron) plant. The skin reaction often includes redness, swelling, blisters, and extreme itching. What are the causes? This condition is caused by a specific chemical (urushiol) that is found in the sap of the poison oak plant. This chemical is sticky and it can be easily spread to people, animals, and objects. You can get poison oak dermatitis by:  Having direct contact with a poison oak plant.  Touching animals, other people, or objects that have come in contact with poison oak and have the chemical on them.  What increases the risk? This condition is more likely to develop in people who:  Are outdoors often.  Go outdoors without wearing protective clothing, such as closed shoes, long pants, and a long-sleeved shirt.  What are the signs or symptoms? Symptoms of this condition include:  Redness of the skin.  A rash that may develop blisters.  Extreme itching.  Swelling. This may occur if the reaction is more severe.  Symptoms usually last for 1-2 weeks. However, the first time you develop this condition, symptoms may last 3-4 weeks. How is this diagnosed? This condition may be diagnosed based on your symptoms and a physical exam. Your health care provider may also ask you about any recent outdoor activity. How is this treated? Treatment for this condition will vary depending on how severe it is. Treatment may include:  Hydrocortisone creams or calamine lotions to relieve itching.  Oatmeal baths to soothe the skin.  Over-the-counter antihistamine tablets.  Oral steroid medicine for more severe outbreaks.  Follow these instructions at  home:  Take or apply over-the-counter and prescription medicines only as told by your health care provider.  Wash exposed skin as soon as possible with soap and cold water.  Use hydrocortisone creams or calamine lotion as needed to soothe the skin and relieve itching.  Take oatmeal baths as needed. Use colloidal oatmeal. You can get this at your local pharmacy or grocery store. Follow the instructions on the packaging.  Do not scratch or rub your skin.  While you have the rash, wash clothes right after you wear them. How is this prevented?  Learn to identify the poison oak plant and avoid contact with the plant. This plant can be recognized by the number of leaves. Generally, poison oak has three leaves with flowering branches on a single stem. The leaves are often a bit fuzzy and have a toothlike edge.  If you have been exposed to poison oak, thoroughly wash with soap and water right away. You have about 30 minutes to remove the plant resin before it will cause the rash. Be sure to wash under your fingernails because any plant resin there will continue to spread the rash.  When hiking or camping, wear clothes that will help you avoid exposure on the skin. This includes long pants, a long-sleeved shirt, tall socks, and hiking boots. You can also apply preventive lotion to your skin to help limit exposure.  If you suspect that your clothes or outdoor gear came in contact with poison oak, rinse them off outside with a garden hose before bringing them inside your house. Contact a  health care provider if:  You have open sores in the rash area.  You have more redness, swelling, or pain in the affected area.  You have redness that spreads beyond the rash area.  You have fluid, blood, or pus coming from the affected area.  You have a fever.  You have a rash over a large area of your body.  You have a rash on your eyes, mouth, or genitals.  Your rash does not improve after a few  days. Get help right away if:  Your face swells or your eyes swell shut.  You have trouble breathing.  You have trouble swallowing. This information is not intended to replace advice given to you by your health care provider. Make sure you discuss any questions you have with your health care provider. Document Released: 01/11/2003 Document Revised: 12/13/2015 Document Reviewed: 12/13/2014 Elsevier Interactive Patient Education  [employer]2018 Elsevier Inc.

## 2017-10-12 NOTE — Progress Notes (Signed)
Subjective:    Patient ID: Melissa Kirby, female    DOB: 09/24/1960, 57 y.o.   MRN: [id number]  HPI The patient is here for an acute visit.  Rash face, neck and arms:  Her symptoms started Saturday afternoon after working in her yard all morning. She thinks she came in contact with poison ivy or oak. She scrubbed with poison ivy soap.   She tried calamine and benadryl. Nothing is helping and the itching is driving her crazy.  She was not able to sleep last night.   Both arms are affected.  She has itching in her left ear and right eye - she is not sure if this is from the poison ivy/oak or allergies.  She denies any rash/itch on her legs or torso.  Medications and allergies reviewed with patient and updated if appropriate.  Patient Active Problem List   Diagnosis Date Noted  . Acute non-recurrent maxillary sinusitis 09/18/2017  . Pleuritic chest pain 09/18/2017  . Cough 09/18/2017  . Osteopenia 08/19/2017  . Gluten intolerance 08/12/2017  . Exercise-induced asthma 08/12/2017  . Bacterial vaginitis 08/12/2017  . Abnormal results of liver function studies 04/06/2017  . Elevated liver enzymes 03/30/2017  . Acute non intractable tension-type headache 03/24/2017  . Intracranial hypotension 01/24/2014  . Vitamin D deficiency   . Abnormal brain MRI 10/15/2013  . LBP (low back pain) 05/10/2012  . Fibromyalgia   . Fatigue 01/17/2011  . Atrophic vaginitis 10/17/2010  . Depression 10/17/2010  . Vitamin B12 deficiency (non anemic) 10/04/2009  . Hypothyroidism 10/03/2009  . Migraine headache 10/03/2009    Current Outpatient Medications on File Prior to Visit  Medication Sig Dispense Refill  . albuterol (PROVENTIL HFA;VENTOLIN HFA) 108 (90 Base) MCG/ACT inhaler Inhale 2 puffs into the lungs every 6 (six) hours as needed for wheezing or shortness of breath. 1 Inhaler 8  . levothyroxine (SYNTHROID, LEVOTHROID) 125 MCG tablet Take 1 tablet (125 mcg total) by mouth daily. 90 tablet 3  .  nortriptyline (PAMELOR) 50 MG capsule TAKE 2 CAPSULES (100 MG TOTAL) BY MOUTH AT BEDTIME. 180 capsule 1  . omeprazole (PRILOSEC) 40 MG capsule Take 1 capsule (40 mg total) daily by mouth. 30-60 mins before breakfast 90 capsule 2   No current facility-administered medications on file prior to visit.     Past Medical History:  Diagnosis Date  . ANEMIA, IRON DEFICIENCY   . Cerebrospinal fluid leak from spinal puncture    improved with 7th patch at [facility] 05/2014  . Depression    therapy  . Fibromyalgia 1999   no meds  . GASTRITIS 11/12/2009   Qualifier: Diagnosis of  By: Theadora Rama RN, Silvio Pate    . GERD (gastroesophageal reflux disease)    per endo. - does not take any meds.    . Kidney stones 1998, 2005   x 2  . MIGRAINE HEADACHE    last Migraine 06/29/2011-   . Unspecified hypothyroidism 1982/2000   surgical resection of benign growth - partial then complete  . VITAMIN B12 DEFICIENCY 09/2009 dx    Past Surgical History:  Procedure Laterality Date  . CHOLECYSTECTOMY  2009  . DIAGNOSTIC LAPAROSCOPY  1982   diagnostic  . POSTERIOR FUSION LUMBAR SPINE  06/2011  . THYROID SURGERY  1983   partial   . TOTAL THYROIDECTOMY  2000   Had thyroid nodule, nonmalignant (4098-1191) resection  . TUBAL LIGATION  1989    Social History   Socioeconomic History  . Marital status:  Married    Spouse name: Not on file  . Number of children: 3  . Years of education: Not on file  . Highest education level: Not on file  Occupational History  . Not on file  Social Needs  . Financial resource strain: Not on file  . Food insecurity:    Worry: Not on file    Inability: Not on file  . Transportation needs:    Medical: Not on file    Non-medical: Not on file  Tobacco Use  . Smoking status: Never Smoker  . Smokeless tobacco: Never Used  Substance and Sexual Activity  . Alcohol use: No    Alcohol/week: 0.0 oz  . Drug use: No  . Sexual activity: Yes    Birth control/protection: Surgical     Comment: Married lives with spouse, 3 children. 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 Physical activity:    Days per week: Not on file    Minutes per session: Not on file  . Stress: Not on file  Relationships  . Social connections:    Talks on phone: Not on file    Gets together: Not on file    Attends religious service: Not on file    Active member of club or organization: Not on file    Attends meetings of clubs or organizations: Not on file    Relationship status: Not on file  Other Topics Concern  . Not on file  Social History Narrative   Ongoing divorce from husband, separated since 05/2014. Lives alone     Works at [employer] and [employer].  Education: high school 12th grade.      Exercises regularly    Family History  Problem Relation Age of Onset  . Hyperlipidemia Mother   . Heart disease Mother 37       massive MI  . Hyperlipidemia Father   . COPD Father        severe, smoker  . Breast cancer Maternal Grandmother   . Hyperlipidemia Maternal Grandmother   . Coronary artery disease Maternal Grandmother 78       CABG  . Ovarian cancer Other   . Anesthesia problems Neg Hx   . Malignant hyperthermia Neg Hx   . Pseudochol deficiency Neg Hx   . Hypotension Neg Hx     Review of Systems  Constitutional: Negative for chills and fever.  HENT: Negative for ear pain (left ear itching), sore throat and trouble swallowing.   Eyes: Positive for itching.  Respiratory: Negative for cough, shortness of breath and wheezing.   Skin: Positive for rash.  Neurological: Negative for light-headedness and headaches.       Objective:   Vitals:   10/12/17 1307  BP: 140/86  Pulse: (!) 112  Resp: 16  Temp: 98.7 F (37.1 C)  SpO2: 97%   Wt Readings from Last 3 Encounters:  10/12/17 147 lb (66.7 kg)  09/18/17 146 lb (66.2 kg)  08/12/17 147 lb (66.7 kg)   Body mass index is 26.04 kg/m.   Physical Exam  Constitutional: She appears well-developed and  well-nourished. No distress.  HENT:  Head: Normocephalic and atraumatic.  Right Ear: External ear normal.  Left Ear: External ear normal.  B/l ear canals normal  Eyes: Conjunctivae are normal. Right eye exhibits no discharge. Left eye exhibits no discharge. No scleral icterus.  Musculoskeletal: She exhibits no edema.  Skin: Skin is warm and dry. Rash (b/l arms with fluid filled blisters  with erythema surrounding, mild scratch marks) noted. She is not diaphoretic. There is erythema.        Assessment & Plan:    See Problem List for Assessment and Plan of chronic medical problems.

## 2017-10-12 NOTE — Assessment & Plan Note (Signed)
Rash and symptoms consistent with poison oak or ivy Itching is severe ? Involvement of right ear and left ear - no obvious rash, but intense itch Depo-medrol 80 mg IM x 1 prednisone taper starting tomorrow Discussed possible side effects of prednisone - she has tolerated prednisone in the past Call if no improvement

## 2017-10-23 ENCOUNTER — Other Ambulatory Visit: Payer: Self-pay | Admitting: Internal Medicine

## 2017-10-23 DIAGNOSIS — Z1231 Encounter for screening mammogram for malignant neoplasm of breast: Secondary | ICD-10-CM

## 2017-10-28 DIAGNOSIS — Z01419 Encounter for gynecological examination (general) (routine) without abnormal findings: Secondary | ICD-10-CM | POA: Diagnosis not present

## 2017-10-28 DIAGNOSIS — Z6825 Body mass index (BMI) 25.0-25.9, adult: Secondary | ICD-10-CM | POA: Diagnosis not present

## 2017-11-04 LAB — HM PAP SMEAR: HM Pap smear: POSITIVE

## 2017-11-16 ENCOUNTER — Ambulatory Visit
Admission: RE | Admit: 2017-11-16 | Discharge: 2017-11-16 | Disposition: A | Discharge status: Home / Self Care | Payer: 59 | Source: Ambulatory Visit | Attending: Internal Medicine | Admitting: Internal Medicine

## 2017-11-16 DIAGNOSIS — Z1231 Encounter for screening mammogram for malignant neoplasm of breast: Secondary | ICD-10-CM | POA: Diagnosis not present

## 2017-11-27 ENCOUNTER — Ambulatory Visit: Payer: 59 | Admitting: Internal Medicine

## 2017-11-27 ENCOUNTER — Encounter: Payer: Self-pay | Admitting: Internal Medicine

## 2017-11-27 VITALS — BP 136/88 | HR 119 | Temp 98.7°F | Resp 16 | Wt 149.0 lb

## 2017-11-27 DIAGNOSIS — F419 Anxiety disorder, unspecified: Secondary | ICD-10-CM | POA: Insufficient documentation

## 2017-11-27 DIAGNOSIS — G43809 Other migraine, not intractable, without status migrainosus: Secondary | ICD-10-CM

## 2017-11-27 DIAGNOSIS — F3289 Other specified depressive episodes: Secondary | ICD-10-CM | POA: Diagnosis not present

## 2017-11-27 MED ORDER — FLUOXETINE HCL 20 MG PO TABS: 20.0000 mg | ORAL_TABLET | Freq: Every day | ORAL | 5 refills | Status: DC

## 2017-11-27 NOTE — Assessment & Plan Note (Signed)
Migraines overall controlled-improved after stopping gluten ?  Need nortriptyline-we will try tapering her off so we can start an SSRI May need to add another medication

## 2017-11-27 NOTE — Progress Notes (Signed)
Subjective:    Patient ID: Melissa Kirby, female    DOB: 1960/10/07, 57 y.o.   MRN: [id number]  HPI The patient is here for an acute visit.  Stress level is much increased  - she has two new bosses and has an abnormal pap and has to have a biopsy next week.  She is having increased stress/anxiety and feels depressed.  She feels overwhelmed her sleep is not as good - she wakes up 2-3 times a night.  She is eating more and has gained weight.  She denies suicidal thoughts or thoughts of hurting anyone.     Migraine headaches;  She is on the nortriptyline for the migraines - it was also to help with her sleep.  Her migraines were much better after stopping th gluten.  She only has a rare migraine.  She is unsure if she still needs the medication.     Medications and allergies reviewed with patient and updated if appropriate.  Patient Active Problem List   Diagnosis Date Noted  . Poison oak dermatitis 10/12/2017  . Cough 09/18/2017  . Osteopenia 08/19/2017  . Gluten intolerance 08/12/2017  . Exercise-induced asthma 08/12/2017  . Bacterial vaginitis 08/12/2017  . Abnormal results of liver function studies 04/06/2017  . Elevated liver enzymes 03/30/2017  . Acute non intractable tension-type headache 03/24/2017  . Intracranial hypotension 01/24/2014  . Vitamin D deficiency   . Abnormal brain MRI 10/15/2013  . LBP (low back pain) 05/10/2012  . Fibromyalgia   . Fatigue 01/17/2011  . Atrophic vaginitis 10/17/2010  . Depression 10/17/2010  . Vitamin B12 deficiency (non anemic) 10/04/2009  . Hypothyroidism 10/03/2009  . Migraine headache 10/03/2009    Current Outpatient Medications on File Prior to Visit  Medication Sig Dispense Refill  . albuterol (PROVENTIL HFA;VENTOLIN HFA) 108 (90 Base) MCG/ACT inhaler Inhale 2 puffs into the lungs every 6 (six) hours as needed for wheezing or shortness of breath. 1 Inhaler 8  . levothyroxine (SYNTHROID, LEVOTHROID) 125 MCG tablet Take 1 tablet  (125 mcg total) by mouth daily. 90 tablet 3  . nortriptyline (PAMELOR) 50 MG capsule TAKE 2 CAPSULES (100 MG TOTAL) BY MOUTH AT BEDTIME. 180 capsule 1  . omeprazole (PRILOSEC) 40 MG capsule Take 1 capsule (40 mg total) daily by mouth. 30-60 mins before breakfast 90 capsule 2   No current facility-administered medications on file prior to visit.     Past Medical History:  Diagnosis Date  . ANEMIA, IRON DEFICIENCY   . Cerebrospinal fluid leak from spinal puncture    improved with 7th patch at [facility] 05/2014  . Depression    therapy  . Fibromyalgia 1999   no meds  . GASTRITIS 11/12/2009   Qualifier: Diagnosis of  By: Theadora Rama RN, Silvio Pate    . GERD (gastroesophageal reflux disease)    per endo. - does not take any meds.    . Kidney stones 1998, 2005   x 2  . MIGRAINE HEADACHE    last Migraine 06/29/2011-   . Unspecified hypothyroidism 1982/2000   surgical resection of benign growth - partial then complete  . VITAMIN B12 DEFICIENCY 09/2009 dx    Past Surgical History:  Procedure Laterality Date  . CHOLECYSTECTOMY  2009  . DIAGNOSTIC LAPAROSCOPY  1982   diagnostic  . POSTERIOR FUSION LUMBAR SPINE  06/2011  . THYROID SURGERY  1983   partial   . TOTAL THYROIDECTOMY  2000   Had thyroid nodule, nonmalignant ([id number]) resection  . TUBAL  LIGATION  1989    Social History   Socioeconomic History  . Marital status: Married    Spouse name: Not on file  . Number of children: 3  . Years of education: Not on file  . Highest education level: Not on file  Occupational History  . Not on file  Social Needs  . Financial resource strain: Not on file  . Food insecurity:    Worry: Not on file    Inability: Not on file  . Transportation needs:    Medical: Not on file    Non-medical: Not on file  Tobacco Use  . Smoking status: Never Smoker  . Smokeless tobacco: Never Used  Substance and Sexual Activity  . Alcohol use: No    Alcohol/week: 0.0 oz  . Drug use: No  . Sexual activity: Yes      Birth control/protection: Surgical    Comment: Married lives with spouse, 3 children. 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 Physical activity:    Days per week: Not on file    Minutes per session: Not on file  . Stress: Not on file  Relationships  . Social connections:    Talks on phone: Not on file    Gets together: Not on file    Attends religious service: Not on file    Active member of club or organization: Not on file    Attends meetings of clubs or organizations: Not on file    Relationship status: Not on file  Other Topics Concern  . Not on file  Social History Narrative   Ongoing divorce from husband, separated since 05/2014. Lives alone     Works at [employer] and [employer].  Education: high school 12th grade.      Exercises regularly    Family History  Problem Relation Age of Onset  . Hyperlipidemia Mother   . Heart disease Mother 28       massive MI  . Hyperlipidemia Father   . COPD Father        severe, smoker  . Breast cancer Maternal Grandmother   . Hyperlipidemia Maternal Grandmother   . Coronary artery disease Maternal Grandmother 78       CABG  . Ovarian cancer Other   . Anesthesia problems Neg Hx   . Malignant hyperthermia Neg Hx   . Pseudochol deficiency Neg Hx   . Hypotension Neg Hx     Review of Systems  Constitutional: Positive for appetite change (increased).  Psychiatric/Behavioral: Positive for dysphoric mood and sleep disturbance (wakes up 2-3 times a night). Negative for self-injury and suicidal ideas. The patient is nervous/anxious.        Objective:   Vitals:   11/27/17 1302  BP: 136/88  Pulse: (!) 119  Resp: 16  Temp: 98.7 F (37.1 C)  SpO2: 98%   BP Readings from Last 3 Encounters:  11/27/17 136/88  10/12/17 140/86  09/18/17 124/84   Wt Readings from Last 3 Encounters:  11/27/17 149 lb (67.6 kg)  10/12/17 147 lb (66.7 kg)  09/18/17 146 lb (66.2 kg)   Body mass index is 26.39 kg/m.   Physical  Exam  Constitutional: She appears well-developed and well-nourished. No distress.  Skin: Skin is warm and dry. She is not diaphoretic.  Psychiatric: Her behavior is normal. Judgment and thought content normal.  Anxious, close to crying on occasion           Assessment & Plan:  See Problem List for Assessment and Plan of chronic medical problems.

## 2017-11-27 NOTE — Patient Instructions (Addendum)
[facility] [phone]    Medications reviewed and updated.  Changes include decreasing nortriptyline to 50  mg nightly for one week, then every other night for one week and then discontinue.   Start fluoxetine 20 mg daily.    Your prescription(s) have been submitted to your pharmacy. Please take as directed and contact our office if you believe you are having problem(s) with the medication(s).   Please followup in 2 months

## 2017-11-27 NOTE — Assessment & Plan Note (Signed)
Increased depression and anxiety recently due to new events Taper off nortriptyline Start fluoxetine 20 mg daily She will update me via my chart and follow-up in 2 months, sooner if needed We will look into seeing a therapist

## 2017-11-27 NOTE — Assessment & Plan Note (Signed)
Increased depression and anxiety recently due to new events Taper off nortriptyline Start fluoxetine 20 mg daily She will update me via my chart and follow-up in 2 months, sooner if needed We will look into seeing a therapist 

## 2017-12-03 ENCOUNTER — Encounter: Payer: Self-pay | Admitting: Internal Medicine

## 2017-12-03 DIAGNOSIS — R87619 Unspecified abnormal cytological findings in specimens from cervix uteri: Secondary | ICD-10-CM | POA: Diagnosis not present

## 2017-12-03 DIAGNOSIS — N870 Mild cervical dysplasia: Secondary | ICD-10-CM | POA: Diagnosis not present

## 2017-12-03 DIAGNOSIS — N840 Polyp of corpus uteri: Secondary | ICD-10-CM | POA: Diagnosis not present

## 2017-12-03 DIAGNOSIS — R87610 Atypical squamous cells of undetermined significance on cytologic smear of cervix (ASC-US): Secondary | ICD-10-CM | POA: Diagnosis not present

## 2017-12-19 ENCOUNTER — Other Ambulatory Visit: Payer: Self-pay | Admitting: Internal Medicine

## 2017-12-23 ENCOUNTER — Encounter: Payer: Self-pay | Admitting: Internal Medicine

## 2017-12-24 NOTE — Progress Notes (Signed)
Subjective:    Patient ID: Melissa Kirby, female    DOB: 04/28/1961, 57 y.o.   MRN: [id number]  HPI The patient is here for follow up.  She is under a lot of stress still from work.  At her last visit we started Prozac, but she had multiple side effects and had to stop this.  We did take her off her nortriptyline, which she had been on for years for migraines.  She denies any migraines or side effects from coming off the nortriptyline.  The stress level and anxiety are still very heightened and she needs something.  Her blood pressure has been elevated and she knows it is from the stress.  She has had the following blood pressures when she has had it checked in the past few days - 152/97, 160/90, 121/92  She chest tightness, nausea and headaches and a weird discomfort or tightness in her right arm.  She is having panic attacks. She is not sleeping.   With the Prozac she felt very nervous.  She also felt like she was shaking all the time.  After one week she stopped it.  She is exhausted.   She has started seeing someone at the church for therapy, which is helping a little.  She really think she needs medication in order to continue to function.  Migraine headaches: She did taper off the nortriptyline and has not had any migraines.  Medications and allergies reviewed with patient and updated if appropriate.  Patient Active Problem List   Diagnosis Date Noted  . Anxiety 11/27/2017  . Poison oak dermatitis 10/12/2017  . Cough 09/18/2017  . Osteopenia 08/19/2017  . Gluten intolerance 08/12/2017  . Exercise-induced asthma 08/12/2017  . Bacterial vaginitis 08/12/2017  . Abnormal results of liver function studies 04/06/2017  . Elevated liver enzymes 03/30/2017  . Acute non intractable tension-type headache 03/24/2017  . Intracranial hypotension 01/24/2014  . Vitamin D deficiency   . Abnormal brain MRI 10/15/2013  . LBP (low back pain) 05/10/2012  . Fibromyalgia   . Fatigue 01/17/2011    . Atrophic vaginitis 10/17/2010  . Depression 10/17/2010  . Vitamin B12 deficiency (non anemic) 10/04/2009  . Hypothyroidism 10/03/2009  . Migraine headache 10/03/2009    Current Outpatient Medications on File Prior to Visit  Medication Sig Dispense Refill  . albuterol (PROVENTIL HFA;VENTOLIN HFA) 108 (90 Base) MCG/ACT inhaler Inhale 2 puffs into the lungs every 6 (six) hours as needed for wheezing or shortness of breath. 1 Inhaler 8  . levothyroxine (SYNTHROID, LEVOTHROID) 125 MCG tablet Take 1 tablet (125 mcg total) by mouth daily. 90 tablet 3  . omeprazole (PRILOSEC) 40 MG capsule Take 1 capsule (40 mg total) daily by mouth. 30-60 mins before breakfast 90 capsule 2  . [DISCONTINUED] nortriptyline (PAMELOR) 50 MG capsule TAKE 2 CAPSULES (100 MG TOTAL) BY MOUTH AT BEDTIME. 180 capsule 1   No current facility-administered medications on file prior to visit.     Past Medical History:  Diagnosis Date  . ANEMIA, IRON DEFICIENCY   . Cerebrospinal fluid leak from spinal puncture    [facility]  . Depression    therapy  . Fibromyalgia 1999   no meds  . GASTRITIS 11/12/2009   Qualifier: Diagnosis of  By: Theadora Ramaellis RN, Silvio PateShelia    . GERD (gastroesophageal reflux disease)    per endo. - does not take any meds.    . Kidney stones 1998, 2005   x  2  . MIGRAINE HEADACHE    last Migraine 06/29/2011-   . Unspecified hypothyroidism 1982/2000   surgical resection of benign growth - partial then complete  . VITAMIN B12 DEFICIENCY 09/2009 dx    Past Surgical History:  Procedure Laterality Date  . CHOLECYSTECTOMY  2009  . DIAGNOSTIC LAPAROSCOPY  1982   diagnostic  . POSTERIOR FUSION LUMBAR SPINE  06/2011  . THYROID SURGERY  1983   partial   . TOTAL THYROIDECTOMY  2000   Had thyroid nodule, nonmalignant ([id number]) resection  . TUBAL LIGATION  1989    Social History   Socioeconomic History  . Marital status: Married    Spouse name: Not on file  . Number of  children: 3  . Years of education: Not on file  . Highest education level: Not on file  Occupational History  . Not on file  Social Needs  . Financial resource strain: Not on file  . Food insecurity:    Worry: Not on file    Inability: Not on file  . Transportation needs:    Medical: Not on file    Non-medical: Not on file  Tobacco Use  . Smoking status: Never Smoker  . Smokeless tobacco: Never Used  Substance and Sexual Activity  . Alcohol use: No    Alcohol/week: 0.0 oz  . Drug use: No  . Sexual activity: Yes    Birth control/protection: Surgical    Comment: Married lives with spouse, 3 children. 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 Physical activity:    Days per week: Not on file    Minutes per session: Not on file  . Stress: Not on file  Relationships  . Social connections:    Talks on phone: Not on file    Gets together: Not on file    Attends religious service: Not on file    Active member of club or organization: Not on file    Attends meetings of clubs or organizations: Not on file    Relationship status: Not on file  Other Topics Concern  . Not on file  Social History Narrative   Ongoing divorce from husband, separated since 05/2014. Lives alone     Works at [employer] and [employer].  Education: high school 12th grade.      Exercises regularly    Family History  Problem Relation Age of Onset  . Hyperlipidemia Mother   . Heart disease Mother 94       massive MI  . Hyperlipidemia Father   . COPD Father        severe, smoker  . Breast cancer Maternal Grandmother   . Hyperlipidemia Maternal Grandmother   . Coronary artery disease Maternal Grandmother 78       CABG  . Ovarian cancer Other   . Anesthesia problems Neg Hx   . Malignant hyperthermia Neg Hx   . Pseudochol deficiency Neg Hx   . Hypotension Neg Hx     Review of Systems  Constitutional: Positive for fatigue.  Respiratory: Negative for shortness of breath.     Cardiovascular: Positive for chest pain and palpitations. Negative for leg swelling.  Neurological: Positive for headaches.  Psychiatric/Behavioral: Positive for dysphoric mood and sleep disturbance. Negative for self-injury and suicidal ideas. The patient is nervous/anxious.        Objective:   Vitals:   12/25/17 1414  BP: 130/82  Pulse: (!) 111  Resp: 16  Temp: 98.9 F (37.2 C)  SpO2: 98%   BP Readings from Last 3 Encounters:  12/25/17 130/82  11/27/17 136/88  10/12/17 140/86   Wt Readings from Last 3 Encounters:  12/25/17 144 lb (65.3 kg)  11/27/17 149 lb (67.6 kg)  10/12/17 147 lb (66.7 kg)   Body mass index is 25.51 kg/m.   Physical Exam  Constitutional: She appears well-developed and well-nourished. No distress.  HENT:  Head: Normocephalic and atraumatic.  Pulmonary/Chest: Effort normal.  Skin: Skin is warm and dry. She is not diaphoretic.  Psychiatric: Her behavior is normal. Judgment and thought content normal.  Anxious mood and affect           Assessment & Plan:    See Problem List for Assessment and Plan of chronic medical problems.

## 2017-12-25 ENCOUNTER — Encounter: Payer: Self-pay | Admitting: Internal Medicine

## 2017-12-25 ENCOUNTER — Ambulatory Visit: Payer: 59 | Admitting: Internal Medicine

## 2017-12-25 VITALS — BP 130/82 | HR 111 | Temp 98.9°F | Resp 16 | Wt 144.0 lb

## 2017-12-25 DIAGNOSIS — F419 Anxiety disorder, unspecified: Secondary | ICD-10-CM | POA: Diagnosis not present

## 2017-12-25 DIAGNOSIS — G4700 Insomnia, unspecified: Secondary | ICD-10-CM | POA: Insufficient documentation

## 2017-12-25 DIAGNOSIS — F3289 Other specified depressive episodes: Secondary | ICD-10-CM

## 2017-12-25 DIAGNOSIS — G43809 Other migraine, not intractable, without status migrainosus: Secondary | ICD-10-CM | POA: Diagnosis not present

## 2017-12-25 DIAGNOSIS — R030 Elevated blood-pressure reading, without diagnosis of hypertension: Secondary | ICD-10-CM

## 2017-12-25 MED ORDER — ESCITALOPRAM OXALATE 10 MG PO TABS: 10.0000 mg | ORAL_TABLET | Freq: Every day | ORAL | 5 refills | Status: DC

## 2017-12-25 MED ORDER — LORAZEPAM 0.5 MG PO TABS: 0.5000 mg | ORAL_TABLET | Freq: Every evening | ORAL | 0 refills | Status: DC | PRN

## 2017-12-25 NOTE — Assessment & Plan Note (Signed)
Was on nortriptyline for years, but we tapered her off slowly and has been fine since We will monitor for migraines

## 2017-12-25 NOTE — Assessment & Plan Note (Signed)
Still experiencing significant depression related to stress and anxiety from work Did not tolerate Prozac Will try Lexapro She will also feel better once her sleep is improved Will do Ativan at bedtime as needed, but discussed potential risk of addiction and encouraged her only to use this for the first few nights and then only as needed.  Once Lexapro becomes effective she should not need the Ativan

## 2017-12-25 NOTE — Assessment & Plan Note (Addendum)
Did not tolerate prozac - made her more anxious Will try Lexapro She will also feel better once her sleep is improved Will do Ativan at bedtime as needed, but discussed potential risk of addiction and encouraged her only to use this for the first few nights and then only as needed.  Once Lexapro becomes effective she should not need the Ativan

## 2017-12-25 NOTE — Assessment & Plan Note (Signed)
Blood pressure has typically been well controlled Her blood pressure has been elevated recently in the setting of extreme anxiety and stress I think if we get her anxiety and stress level better controlled her blood pressure will return to normal We will hold off on medication for her blood pressure at this time Medication for anxiety started She will continue to monitor her blood pressure and if it does remain elevated will be we will need to start medication

## 2017-12-25 NOTE — Patient Instructions (Addendum)
Start lexapro for your anxiety.  Use ativan at night only if needed for sleep.  Continue to monitor your BP.     Call if no improvement.

## 2017-12-25 NOTE — Assessment & Plan Note (Signed)
She is having difficulty sleeping secondary to severe anxiety/stress Started on Lexapro Given the fact that she has not been sleeping well and is having difficulty functioning will give Ativan for a few nights and then only as needed to help her sleep-discussed that this is potentially addicting and do not want to use this long-term and she agrees

## 2017-12-27 ENCOUNTER — Other Ambulatory Visit: Payer: Self-pay | Admitting: Internal Medicine

## 2017-12-31 ENCOUNTER — Ambulatory Visit: Payer: 59 | Admitting: Internal Medicine

## 2017-12-31 ENCOUNTER — Encounter: Payer: Self-pay | Admitting: Internal Medicine

## 2017-12-31 VITALS — BP 156/98 | HR 81 | Temp 98.2°F | Resp 16

## 2017-12-31 DIAGNOSIS — G43809 Other migraine, not intractable, without status migrainosus: Secondary | ICD-10-CM | POA: Diagnosis not present

## 2017-12-31 MED ORDER — NORTRIPTYLINE HCL 50 MG PO CAPS: ORAL_CAPSULE | ORAL | 1 refills | Status: DC

## 2017-12-31 MED ORDER — METHYLPREDNISOLONE ACETATE 80 MG/ML IJ SUSP
80.0000 mg | Freq: Once | INTRAMUSCULAR | Status: AC
  Administered 2017-12-31: 80 mg via INTRAMUSCULAR

## 2017-12-31 MED ORDER — PROMETHAZINE HCL 12.5 MG PO TABS: 12.5000 mg | ORAL_TABLET | Freq: Three times a day (TID) | ORAL | 5 refills | Status: DC | PRN

## 2017-12-31 NOTE — Assessment & Plan Note (Addendum)
She is experiencing a typical migraine for her and is most likely related to us discontinuing the nortriptyline Neurological exam nonfocal We will restart nortriptyline-start 50 mg nightly for 3 nights and then increase to 100 mg We will treat current intractable migraine with Depo-Medrol 80 mg IM x1 Prescription for Phenergan given-she will take this when she gets home and as needed Call if no improvement

## 2017-12-31 NOTE — Progress Notes (Signed)
Subjective:    Patient ID: Melissa Kirby, female    DOB: 1961-04-21, 57 y.o.   MRN: [id number]  HPI The patient is here for an acute visit.  Tractable migraine headache: She has a long history of migraine headaches, but has been taking nortriptyline nightly to prevent them.  We did try discontinuing the nortriptyline and not long ago and initially she was headache free, but 2 days ago developed a migraine.  The migraine started 2 nights ago and it was severe enough she was not able to get out of bed yesterday.  She tried Excedrin Migraine, quiet dark rooms, cold packs nothing seems to be helping.  She thinks she needs to go back on the nortriptyline.  This is a typical headache for her.  Pain starts in the posterior head and radiates up to the top of the head.  She has nausea, vomiting, aura.   Medications and allergies reviewed with patient and updated if appropriate.  Patient Active Problem List   Diagnosis Date Noted  . Insomnia 12/25/2017  . Anxiety 11/27/2017  . Poison oak dermatitis 10/12/2017  . Cough 09/18/2017  . Osteopenia 08/19/2017  . Gluten intolerance 08/12/2017  . Exercise-induced asthma 08/12/2017  . Bacterial vaginitis 08/12/2017  . Abnormal results of liver function studies 04/06/2017  . Elevated liver enzymes 03/30/2017  . Acute non intractable tension-type headache 03/24/2017  . Intracranial hypotension 01/24/2014  . Vitamin D deficiency   . Abnormal brain MRI 10/15/2013  . Elevated blood pressure reading 10/13/2013  . LBP (low back pain) 05/10/2012  . Fibromyalgia   . Fatigue 01/17/2011  . Atrophic vaginitis 10/17/2010  . Depression 10/17/2010  . Vitamin B12 deficiency (non anemic) 10/04/2009  . Hypothyroidism 10/03/2009  . Migraine headache 10/03/2009    Current Outpatient Medications on File Prior to Visit  Medication Sig Dispense Refill  . albuterol (PROVENTIL HFA;VENTOLIN HFA) 108 (90 Base) MCG/ACT inhaler Inhale 2 puffs into the lungs every 6  (six) hours as needed for wheezing or shortness of breath. 1 Inhaler 8  . escitalopram (LEXAPRO) 10 MG tablet Take 1 tablet (10 mg total) by mouth daily. 30 tablet 5  . levothyroxine (SYNTHROID, LEVOTHROID) 125 MCG tablet Take 1 tablet (125 mcg total) by mouth daily. 90 tablet 3  . LORazepam (ATIVAN) 0.5 MG tablet Take 1 tablet (0.5 mg total) by mouth at bedtime as needed for anxiety. 30 tablet 0  . omeprazole (PRILOSEC) 40 MG capsule Take 1 capsule (40 mg total) daily by mouth. 30-60 mins before breakfast 90 capsule 2  . [DISCONTINUED] nortriptyline (PAMELOR) 50 MG capsule TAKE 2 CAPSULES (100 MG TOTAL) BY MOUTH AT BEDTIME. 180 capsule 1   No current facility-administered medications on file prior to visit.     Past Medical History:  Diagnosis Date  . ANEMIA, IRON DEFICIENCY   . Cerebrospinal fluid leak from spinal puncture    improved with 7th patch at [facility] 05/2014  . Depression    therapy  . Fibromyalgia 1999   no meds  . GASTRITIS 11/12/2009   Qualifier: Diagnosis of  By: Theadora Rama RN, Silvio Pate    . GERD (gastroesophageal reflux disease)    per endo. - does not take any meds.    . Kidney stones 1998, 2005   x 2  . MIGRAINE HEADACHE    last Migraine 06/29/2011-   . Unspecified hypothyroidism 1982/2000   surgical resection of benign growth - partial then complete  . VITAMIN B12 DEFICIENCY 09/2009 dx  Past Surgical History:  Procedure Laterality Date  . CHOLECYSTECTOMY  2009  . DIAGNOSTIC LAPAROSCOPY  1982   diagnostic  . POSTERIOR FUSION LUMBAR SPINE  06/2011  . THYROID SURGERY  1983   partial   . TOTAL THYROIDECTOMY  2000   Had thyroid nodule, nonmalignant ([id number]) resection  . TUBAL LIGATION  1989    Social History   Socioeconomic History  . Marital status: Married    Spouse name: Not on file  . Number of children: 3  . Years of education: Not on file  . Highest education level: Not on file  Occupational History  . Not on file  Social Needs  . Financial  resource strain: Not on file  . Food insecurity:    Worry: Not on file    Inability: Not on file  . Transportation needs:    Medical: Not on file    Non-medical: Not on file  Tobacco Use  . Smoking status: Never Smoker  . Smokeless tobacco: Never Used  Substance and Sexual Activity  . Alcohol use: No    Alcohol/week: 0.0 oz  . Drug use: No  . Sexual activity: Yes    Birth control/protection: Surgical    Comment: Married lives with spouse, 3 children. 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 Physical activity:    Days per week: Not on file    Minutes per session: Not on file  . Stress: Not on file  Relationships  . Social connections:    Talks on phone: Not on file    Gets together: Not on file    Attends religious service: Not on file    Active member of club or organization: Not on file    Attends meetings of clubs or organizations: Not on file    Relationship status: Not on file  Other Topics Concern  . Not on file  Social History Narrative   Ongoing divorce from husband, separated since 05/2014. Lives alone     Works at [employer] and [employer].  Education: high school 12th grade.      Exercises regularly    Family History  Problem Relation Age of Onset  . Hyperlipidemia Mother   . Heart disease Mother 34       massive MI  . Hyperlipidemia Father   . COPD Father        severe, smoker  . Breast cancer Maternal Grandmother   . Hyperlipidemia Maternal Grandmother   . Coronary artery disease Maternal Grandmother 78       CABG  . Ovarian cancer Other   . Anesthesia problems Neg Hx   . Malignant hyperthermia Neg Hx   . Pseudochol deficiency Neg Hx   . Hypotension Neg Hx     Review of Systems  Constitutional: Positive for appetite change (Decreased) and fatigue.  Eyes: Positive for visual disturbance.  Gastrointestinal: Positive for nausea and vomiting.  Neurological: Positive for headaches. Negative for weakness, light-headedness and numbness.        Objective:   Vitals:   12/31/17 1018  BP: (!) 156/98  Pulse: 81  Resp: 16  Temp: 98.2 F (36.8 C)  SpO2: 98%   BP Readings from Last 3 Encounters:  12/31/17 (!) 156/98  12/25/17 130/82  11/27/17 136/88   Wt Readings from Last 3 Encounters:  12/25/17 144 lb (65.3 kg)  11/27/17 149 lb (67.6 kg)  10/12/17 147 lb (66.7 kg)   There is no height or weight on file  to calculate BMI.   Physical Exam  Constitutional: She is oriented to person, place, and time. She appears well-developed and well-nourished. She appears distressed (Mildly distressed secondary to pain from migraine).  HENT:  Head: Normocephalic and atraumatic.  Neurological: She is alert and oriented to person, place, and time. No cranial nerve deficit.  Nonfocal  Skin: Skin is warm and dry. She is not diaphoretic.  Psychiatric: She has a normal mood and affect. Her behavior is normal.         Assessment & Plan:    See Problem List for Assessment and Plan of chronic medical problems.

## 2017-12-31 NOTE — Patient Instructions (Signed)
Your received a steroid injection today.   Phenergan and the nortriptyline was sent to your pharmacy.

## 2018-01-05 DIAGNOSIS — N72 Inflammatory disease of cervix uteri: Secondary | ICD-10-CM | POA: Diagnosis not present

## 2018-01-05 DIAGNOSIS — N871 Moderate cervical dysplasia: Secondary | ICD-10-CM | POA: Diagnosis not present

## 2018-01-11 ENCOUNTER — Telehealth: Payer: Self-pay | Admitting: Internal Medicine

## 2018-01-11 NOTE — Telephone Encounter (Signed)
Forms have been completed & placed in providers box to sign.  °

## 2018-01-11 NOTE — Telephone Encounter (Signed)
Patient came by the office and dropped off FMLA to be completed for days missed from work (June 12th-14th). She is needing this completed by July 3rd. Patient would like a call when it is ready and she will come pick it up. Placed in Brittany's box for completion.

## 2018-01-12 DIAGNOSIS — Z0279 Encounter for issue of other medical certificate: Secondary | ICD-10-CM

## 2018-01-12 NOTE — Telephone Encounter (Signed)
Forms have been signed, faxed to [employer] [phone], copy sent to scan, &charged for.   Original up front for patient to pick up.

## 2018-01-12 NOTE — Telephone Encounter (Signed)
Thank you.  signed

## 2018-01-13 DIAGNOSIS — D509 Iron deficiency anemia, unspecified: Secondary | ICD-10-CM | POA: Diagnosis not present

## 2018-01-16 ENCOUNTER — Other Ambulatory Visit: Payer: Self-pay | Admitting: Internal Medicine

## 2018-01-26 NOTE — Progress Notes (Signed)
Subjective:    Patient ID: Melissa Kirby, female    DOB: 07-04-1961, 57 y.o.   MRN: [id number]  HPI The patient is here for follow up.  Migraine headaches:  We restarted her nortriptyline at her last visit and she is taking it nightly.  Her headaches have improved, but she still has intermittent dull headache.  Anxiety: She still has an extreme amount of stress from work.  She is taking Lexapro daily.  She does feel that that has helped slightly.  Getting back on the nortriptyline has helped.  She does not think that the lorazepam has helped at nighttime.  She is trying to deal with some of the stress at work.  She is seeing someone outside of work and that is a good distraction.  She is stopped answering her phone when she gets home from work there were calls and that is also helping.  Depression: She is taking her medication daily as prescribed. She denies any side effects from the medication. She feels her depression is adequately controlled and she is happy with her current dose of medication for now.   B12 deficiency: She has been on monthly B12 injections in the past.  She felt these work much better than taking oral supplementation.  Her last values were still on the low side.  Medications and allergies reviewed with patient and updated if appropriate.  Patient Active Problem List   Diagnosis Date Noted  . Insomnia 12/25/2017  . Anxiety 11/27/2017  . Poison oak dermatitis 10/12/2017  . Osteopenia 08/19/2017  . Gluten intolerance 08/12/2017  . Exercise-induced asthma 08/12/2017  . Abnormal results of liver function studies 04/06/2017  . Elevated liver enzymes 03/30/2017  . Intracranial hypotension 01/24/2014  . Vitamin D deficiency   . Abnormal brain MRI 10/15/2013  . Elevated blood pressure reading 10/13/2013  . LBP (low back pain) 05/10/2012  . Fibromyalgia   . Fatigue 01/17/2011  . Atrophic vaginitis 10/17/2010  . Depression 10/17/2010  . Vitamin B12 deficiency (non  anemic) 10/04/2009  . Hypothyroidism 10/03/2009  . Migraine headache 10/03/2009    Current Outpatient Medications on File Prior to Visit  Medication Sig Dispense Refill  . albuterol (PROVENTIL HFA;VENTOLIN HFA) 108 (90 Base) MCG/ACT inhaler Inhale 2 puffs into the lungs every 6 (six) hours as needed for wheezing or shortness of breath. 1 Inhaler 8  . escitalopram (LEXAPRO) 10 MG tablet TAKE 1 TABLET BY MOUTH EVERY DAY 90 tablet 1  . levothyroxine (SYNTHROID, LEVOTHROID) 125 MCG tablet Take 1 tablet (125 mcg total) by mouth daily. 90 tablet 3  . LORazepam (ATIVAN) 0.5 MG tablet Take 1 tablet (0.5 mg total) by mouth at bedtime as needed for anxiety. 30 tablet 0  . nortriptyline (PAMELOR) 50 MG capsule TAKE 2 CAPSULES (100 MG TOTAL) BY MOUTH AT BEDTIME. 180 capsule 1  . omeprazole (PRILOSEC) 40 MG capsule Take 1 capsule (40 mg total) daily by mouth. 30-60 mins before breakfast 90 capsule 2  . promethazine (PHENERGAN) 12.5 MG tablet Take 1 tablet (12.5 mg total) by mouth every 8 (eight) hours as needed for nausea or vomiting. 30 tablet 5   No current facility-administered medications on file prior to visit.     Past Medical History:  Diagnosis Date  . ANEMIA, IRON DEFICIENCY   . Cerebrospinal fluid leak from spinal puncture    [facility]  . Depression    therapy  . Fibromyalgia 1999   no meds  .  GASTRITIS 11/12/2009   Qualifier: Diagnosis of  By: Theadora Rama RN, Silvio Pate    . GERD (gastroesophageal reflux disease)    per endo. - does not take any meds.    . Kidney stones 1998, 2005   x 2  . MIGRAINE HEADACHE    last Migraine 06/29/2011-   . Unspecified hypothyroidism 1982/2000   surgical resection of benign growth - partial then complete  . VITAMIN B12 DEFICIENCY 09/2009 dx    Past Surgical History:  Procedure Laterality Date  . CHOLECYSTECTOMY  2009  . DIAGNOSTIC LAPAROSCOPY  1982   diagnostic  . POSTERIOR FUSION LUMBAR SPINE  06/2011  . THYROID SURGERY   1983   partial   . TOTAL THYROIDECTOMY  2000   Had thyroid nodule, nonmalignant ([id number]) resection  . TUBAL LIGATION  1989    Social History   Socioeconomic History  . Marital status: Married    Spouse name: Not on file  . Number of children: 3  . Years of education: Not on file  . Highest education level: Not on file  Occupational History  . Not on file  Social Needs  . Financial resource strain: Not on file  . Food insecurity:    Worry: Not on file    Inability: Not on file  . Transportation needs:    Medical: Not on file    Non-medical: Not on file  Tobacco Use  . Smoking status: Never Smoker  . Smokeless tobacco: Never Used  Substance and Sexual Activity  . Alcohol use: No    Alcohol/week: 0.0 oz  . Drug use: No  . Sexual activity: Yes    Birth control/protection: Surgical    Comment: Married lives with spouse, 3 children. 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 Physical activity:    Days per week: Not on file    Minutes per session: Not on file  . Stress: Not on file  Relationships  . Social connections:    Talks on phone: Not on file    Gets together: Not on file    Attends religious service: Not on file    Active member of club or organization: Not on file    Attends meetings of clubs or organizations: Not on file    Relationship status: Not on file  Other Topics Concern  . Not on file  Social History Narrative   Ongoing divorce from husband, separated since 05/2014. Lives alone     Works at [employer] and [employer].  Education: high school 12th grade.      Exercises regularly    Family History  Problem Relation Age of Onset  . Hyperlipidemia Mother   . Heart disease Mother 75       massive MI  . Hyperlipidemia Father   . COPD Father        severe, smoker  . Breast cancer Maternal Grandmother   . Hyperlipidemia Maternal Grandmother   . Coronary artery disease Maternal Grandmother 78       CABG  . Ovarian cancer Other   .  Anesthesia problems Neg Hx   . Malignant hyperthermia Neg Hx   . Pseudochol deficiency Neg Hx   . Hypotension Neg Hx     Review of Systems  Constitutional: Positive for fatigue. Negative for fever.  Respiratory: Negative for shortness of breath.   Cardiovascular: Negative for chest pain and palpitations.  Gastrointestinal: Positive for nausea (Intermittent with headaches).  Neurological: Positive for headaches.  Psychiatric/Behavioral: Positive  for dysphoric mood. The patient is nervous/anxious.        Objective:   Vitals:   01/27/18 1559  BP: 122/84  Pulse: (!) 110  Resp: 16  Temp: 97.9 F (36.6 C)  SpO2: 97%   BP Readings from Last 3 Encounters:  01/27/18 122/84  12/31/17 (!) 156/98  12/25/17 130/82   Wt Readings from Last 3 Encounters:  01/27/18 149 lb (67.6 kg)  12/25/17 144 lb (65.3 kg)  11/27/17 149 lb (67.6 kg)   Body mass index is 26.39 kg/m.   Physical Exam  Constitutional: She is oriented to person, place, and time. She appears well-developed and well-nourished. No distress.  HENT:  Head: Normocephalic and atraumatic.  Neurological: She is alert and oriented to person, place, and time. No cranial nerve deficit.  Nonfocal  Skin: Skin is warm and dry. She is not diaphoretic.  Psychiatric: She has a normal mood and affect. Her behavior is normal. Judgment and thought content normal.           Assessment & Plan:    See Problem List for Assessment and Plan of chronic medical problems.

## 2018-01-27 ENCOUNTER — Ambulatory Visit: Payer: 59 | Admitting: Internal Medicine

## 2018-01-27 ENCOUNTER — Encounter: Payer: Self-pay | Admitting: Internal Medicine

## 2018-01-27 VITALS — BP 122/84 | HR 110 | Temp 97.9°F | Resp 16 | Wt 149.0 lb

## 2018-01-27 DIAGNOSIS — F419 Anxiety disorder, unspecified: Secondary | ICD-10-CM

## 2018-01-27 DIAGNOSIS — G43809 Other migraine, not intractable, without status migrainosus: Secondary | ICD-10-CM | POA: Diagnosis not present

## 2018-01-27 DIAGNOSIS — E538 Deficiency of other specified B group vitamins: Secondary | ICD-10-CM

## 2018-01-27 DIAGNOSIS — F3289 Other specified depressive episodes: Secondary | ICD-10-CM

## 2018-01-27 MED ORDER — CYANOCOBALAMIN 1000 MCG/ML IJ SOLN
1000.0000 ug | Freq: Once | INTRAMUSCULAR | Status: AC
  Administered 2018-01-27: 1000 ug via INTRAMUSCULAR

## 2018-01-27 NOTE — Assessment & Plan Note (Signed)
Controlled Continue current medication Depression is more related to anxiety and stress

## 2018-01-27 NOTE — Patient Instructions (Addendum)
You had a B12 injection today and come monthly for B12 injections.   Medications reviewed and updated.  No changes recommended at this time.

## 2018-01-27 NOTE — Assessment & Plan Note (Signed)
B12 injections -monthly - more effective than oral supplementation Restart monthly injections - first injection today

## 2018-01-27 NOTE — Assessment & Plan Note (Signed)
Improved since going back on nortriptyline, but still having intermittent, dull headaches Continue nortriptyline at current dose Phenergan as needed for nausea Call if headaches do not continue to improve

## 2018-01-27 NOTE — Assessment & Plan Note (Signed)
Slightly improved Continue Lexapro and nortriptyline Will not make any medication changes at this time Continue restricting work to work hours and to good distractions outside of work Hopefully work stress will improve Follow-up in 6 months, sooner if needed

## 2018-02-26 ENCOUNTER — Ambulatory Visit: Payer: 59

## 2018-03-01 ENCOUNTER — Ambulatory Visit (INDEPENDENT_AMBULATORY_CARE_PROVIDER_SITE_OTHER)
Admission: RE | Admit: 2018-03-01 | Discharge: 2018-03-01 | Disposition: A | Discharge status: Home / Self Care | Payer: 59 | Source: Ambulatory Visit | Attending: Family | Admitting: Family

## 2018-03-01 ENCOUNTER — Ambulatory Visit: Payer: 59 | Admitting: Family

## 2018-03-01 ENCOUNTER — Encounter: Payer: Self-pay | Admitting: Family

## 2018-03-01 VITALS — BP 118/88 | HR 100 | Temp 98.2°F | Ht 63.0 in | Wt 151.0 lb

## 2018-03-01 DIAGNOSIS — M545 Low back pain, unspecified: Secondary | ICD-10-CM

## 2018-03-01 DIAGNOSIS — M5440 Lumbago with sciatica, unspecified side: Secondary | ICD-10-CM | POA: Diagnosis not present

## 2018-03-01 DIAGNOSIS — M4326 Fusion of spine, lumbar region: Secondary | ICD-10-CM | POA: Diagnosis not present

## 2018-03-01 MED ORDER — METHYLPREDNISOLONE ACETATE 80 MG/ML IJ SUSP
80.0000 mg | Freq: Once | INTRAMUSCULAR | Status: AC
  Administered 2018-03-01: 80 mg via INTRAMUSCULAR

## 2018-03-01 MED ORDER — HYDROCODONE-ACETAMINOPHEN 5-325 MG PO TABS: 1.0000 | ORAL_TABLET | Freq: Four times a day (QID) | ORAL | 0 refills | Status: DC | PRN

## 2018-03-01 MED ORDER — METHYLPREDNISOLONE 4 MG PO TBPK: ORAL_TABLET | ORAL | 0 refills | Status: DC

## 2018-03-01 NOTE — Progress Notes (Signed)
Melissa Kirby is a 57 y.o. female with the following history as recorded in Epic[facility]:  Patient Active Problem List   Diagnosis Date Noted  . Insomnia 12/25/2017  . Anxiety 11/27/2017  . Poison oak dermatitis 10/12/2017  . Osteopenia 08/19/2017  . Gluten intolerance 08/12/2017  . Exercise-induced asthma 08/12/2017  . Abnormal results of liver function studies 04/06/2017  . Elevated liver enzymes 03/30/2017  . Intracranial hypotension 01/24/2014  . Vitamin D deficiency   . Abnormal brain MRI 10/15/2013  . Elevated blood pressure reading 10/13/2013  . LBP (low back pain) 05/10/2012  . Fibromyalgia   . Fatigue 01/17/2011  . Atrophic vaginitis 10/17/2010  . Depression 10/17/2010  . Vitamin B12 deficiency (non anemic) 10/04/2009  . Hypothyroidism 10/03/2009  . Migraine headache 10/03/2009    Current Outpatient Medications  Medication Sig Dispense Refill  . albuterol (PROVENTIL HFA;VENTOLIN HFA) 108 (90 Base) MCG/ACT inhaler Inhale 2 puffs into the lungs every 6 (six) hours as needed for wheezing or shortness of breath. 1 Inhaler 8  . escitalopram (LEXAPRO) 10 MG tablet TAKE 1 TABLET BY MOUTH EVERY DAY 90 tablet 1  . levothyroxine (SYNTHROID, LEVOTHROID) 125 MCG tablet Take 1 tablet (125 mcg total) by mouth daily. 90 tablet 3  . LORazepam (ATIVAN) 0.5 MG tablet Take 1 tablet (0.5 mg total) by mouth at bedtime as needed for anxiety. 30 tablet 0  . nortriptyline (PAMELOR) 50 MG capsule TAKE 2 CAPSULES (100 MG TOTAL) BY MOUTH AT BEDTIME. 180 capsule 1  . omeprazole (PRILOSEC) 40 MG capsule Take 1 capsule (40 mg total) daily by mouth. 30-60 mins before breakfast 90 capsule 2  . promethazine (PHENERGAN) 12.5 MG tablet Take 1 tablet (12.5 mg total) by mouth every 8 (eight) hours as needed for nausea or vomiting. 30 tablet 5  . HYDROcodone-acetaminophen (NORCO) 5-325 MG tablet Take 1 tablet by mouth every 6 (six) hours as needed for moderate pain. 20 tablet 0  . methylPREDNISolone (MEDROL  DOSEPAK) 4 MG TBPK tablet Taper as directed 21 tablet 0   No current facility-administered medications for this visit.     Allergies: Compazine; Metoclopramide hcl; Prochlorperazine edisylate; Gluten meal; Prozac [fluoxetine hcl]; and Morphine and related  Past Medical History:  Diagnosis Date  . ANEMIA, IRON DEFICIENCY   . Cerebrospinal fluid leak from spinal puncture    [facility]  . Depression    therapy  . Fibromyalgia 1999   no meds  . GASTRITIS 11/12/2009   Qualifier: Diagnosis of  By: Theadora Ramaellis RN, Silvio PateShelia    . GERD (gastroesophageal reflux disease)    per endo. - does not take any meds.    . Kidney stones 1998, 2005   x 2  . MIGRAINE HEADACHE    last Migraine 06/29/2011-   . Unspecified hypothyroidism 1982/2000   surgical resection of benign growth - partial then complete  . VITAMIN B12 DEFICIENCY 09/2009 dx    Past Surgical History:  Procedure Laterality Date  . CHOLECYSTECTOMY  2009  . DIAGNOSTIC LAPAROSCOPY  1982   diagnostic  . POSTERIOR FUSION LUMBAR SPINE  06/2011  . THYROID SURGERY  1983   partial   . TOTAL THYROIDECTOMY  2000   Had thyroid nodule, nonmalignant ([id number]) resection  . TUBAL LIGATION  1989    Family History  Problem Relation Age of Onset  . Hyperlipidemia Mother   . Heart disease Mother 964       massive MI  . Hyperlipidemia Father   .  COPD Father        severe, smoker  . Breast cancer Maternal Grandmother   . Hyperlipidemia Maternal Grandmother   . Coronary artery disease Maternal Grandmother 78       CABG  . Ovarian cancer Other   . Anesthesia problems Neg Hx   . Malignant hyperthermia Neg Hx   . Pseudochol deficiency Neg Hx   . Hypotension Neg Hx     Social History   Tobacco Use  . Smoking status: Never Smoker  . Smokeless tobacco: Never Used  Substance Use Topics  . Alcohol use: No    Alcohol/week: 0.0 standard drinks    Subjective:  Patient presents with 1 week history of low back pain/  radiating down into right foot; history of fusion at L4 6 years ago; history of degenerative disc disease; no known recent injury or trauma; notes that pain is so severe she cannot sleep at night; no change in bowel or bladder habits; no relief with OTC medications;   Objective:  Vitals:   03/01/18 1402  BP: 118/88  Pulse: 100  Temp: 98.2 F (36.8 C)  TempSrc: Oral  SpO2: 98%  Weight: 151 lb 0.6 oz (68.5 kg)  Height: 5\' 3"  (1.6 m)    General: Well developed, well nourished, in no acute distress  Skin : Warm and dry.  Head: Normocephalic and atraumatic  Lungs: Respirations unlabored; Musculoskeletal: No deformities; no active joint inflammation  Extremities: No edema, cyanosis, clubbing  Vessels: Symmetric bilaterally  Neurologic: Alert and oriented; speech intact; face symmetrical; moves all extremities well; CNII-XII intact without focal deficit   Assessment:  1. Low back pain with radiation     Plan:  Update lumbar X-ray- may need to update lumbar MRI; Depo-Medrol IM 80 mg given today; start Medrol Dose pak tomorrow; Rx for Norco to use for pain- verified with patient that she can tolerate; Work note for 3 days- can extend if needed; Follow-up to be determined.   No follow-ups on file.  Orders Placed This Encounter  Procedures  . DG Lumbar Spine Complete    Standing Status:   Future    Number of Occurrences:   1    Standing Expiration Date:   05/02/2019    Order Specific Question:   Reason for Exam (SYMPTOM  OR DIAGNOSIS REQUIRED)    Answer:   low back pain    Order Specific Question:   Is patient pregnant?    Answer:   No    Order Specific Question:   Preferred imaging location?    Answer:   [address]    Order Specific Question:   Radiology Contrast Protocol - do NOT remove file path    Answer:   [email or web address]    Requested Prescriptions   Signed Prescriptions Disp Refills  . methylPREDNISolone (MEDROL DOSEPAK) 4  MG TBPK tablet 21 tablet 0    Sig: Taper as directed  . HYDROcodone-acetaminophen (NORCO) 5-325 MG tablet 20 tablet 0    Sig: Take 1 tablet by mouth every 6 (six) hours as needed for moderate pain.

## 2018-03-02 NOTE — Progress Notes (Signed)
Subjective:    Patient ID: Melissa Kirby, female    DOB: [birth year], 57 y.o.   MRN: [id number]  HPI The patient is here for an acute visit.   Back pain:  She was here two days ago for back pain that started one week prior.  The pain was in the lower back and radiated down to the right foot.  She had back surgery - fusion at L4 6 years ago.  2 days ago she had a depo-medrol 80 mg IM x1, started on medrol and norco.    There has been no change in her pain level and she actually feels her symptoms have worsened.  She has severe pain in her right hip when standing and putting weight on right leg.  The pain starts in her right lower back, goes to her right hip and down her right leg - her right leg feels like a tooth ache.  Her right toes are numb.  Pain is getting worse - it feels like it is progressing.  The numbness in the toes started mid last week and is progressing - it is more persistent.    Laying flat she has numbness in her leg and toes but no pain.  Sitting is painful but not as bad has standing and walking.   She does not take any pain medication during the day so she can function.  She takes the pain medication at night.  She is doing ice and stretches.     DG Lumbar Spine Complete CLINICAL DATA:  Lower back and right leg pain without known injury.  EXAM: LUMBAR SPINE - COMPLETE 4+ VIEW  COMPARISON:  CT scan of May 06, 2016.  FINDINGS: Status post surgical posterior fusion of L4-5 with interbody fusion. No fracture or spondylolisthesis is noted. Remaining disc spaces are intact  IMPRESSION: Status post surgical posterior fusion of L4-5. No acute abnormality seen in the lumbar spine.  Electronically Signed   By: Lupita RaiderJames  Green Jr, M.D.   On: 03/01/2018 15:12   Medications and allergies reviewed with patient and updated if appropriate.  Patient Active Problem List   Diagnosis Date Noted  . Insomnia 12/25/2017  . Anxiety 11/27/2017  . Osteopenia 08/19/2017  .  Gluten intolerance 08/12/2017  . Exercise-induced asthma 08/12/2017  . Abnormal results of liver function studies 04/06/2017  . Elevated liver enzymes 03/30/2017  . Intracranial hypotension 01/24/2014  . Vitamin D deficiency   . Abnormal brain MRI 10/15/2013  . Elevated blood pressure reading 10/13/2013  . LBP (low back pain) 05/10/2012  . Fibromyalgia   . Fatigue 01/17/2011  . Atrophic vaginitis 10/17/2010  . Depression 10/17/2010  . Vitamin B12 deficiency (non anemic) 10/04/2009  . Hypothyroidism 10/03/2009  . Migraine headache 10/03/2009    Current Outpatient Medications on File Prior to Visit  Medication Sig Dispense Refill  . albuterol (PROVENTIL HFA;VENTOLIN HFA) 108 (90 Base) MCG/ACT inhaler Inhale 2 puffs into the lungs every 6 (six) hours as needed for wheezing or shortness of breath. 1 Inhaler 8  . escitalopram (LEXAPRO) 10 MG tablet TAKE 1 TABLET BY MOUTH EVERY DAY 90 tablet 1  . HYDROcodone-acetaminophen (NORCO) 5-325 MG tablet Take 1 tablet by mouth every 6 (six) hours as needed for moderate pain. 20 tablet 0  . levothyroxine (SYNTHROID, LEVOTHROID) 125 MCG tablet Take 1 tablet (125 mcg total) by mouth daily. 90 tablet 3  . LORazepam (ATIVAN) 0.5 MG tablet Take 1 tablet (0.5 mg total) by mouth at bedtime  as needed for anxiety. 30 tablet 0  . methylPREDNISolone (MEDROL DOSEPAK) 4 MG TBPK tablet Taper as directed 21 tablet 0  . nortriptyline (PAMELOR) 50 MG capsule TAKE 2 CAPSULES (100 MG TOTAL) BY MOUTH AT BEDTIME. 180 capsule 1  . omeprazole (PRILOSEC) 40 MG capsule Take 1 capsule (40 mg total) daily by mouth. 30-60 mins before breakfast 90 capsule 2  . promethazine (PHENERGAN) 12.5 MG tablet Take 1 tablet (12.5 mg total) by mouth every 8 (eight) hours as needed for nausea or vomiting. 30 tablet 5   No current facility-administered medications on file prior to visit.     Past Medical History:  Diagnosis Date  . ANEMIA, IRON DEFICIENCY   . Cerebrospinal fluid leak from  spinal puncture    improved with 7th patch at [facility] 05/2014  . Depression    therapy  . Fibromyalgia 1999   no meds  . GASTRITIS 11/12/2009   Qualifier: Diagnosis of  By: Theadora Rama RN, Silvio Pate    . GERD (gastroesophageal reflux disease)    per endo. - does not take any meds.    . Kidney stones 1998, 2005   x 2  . MIGRAINE HEADACHE    last Migraine 06/29/2011-   . Unspecified hypothyroidism 1982/2000   surgical resection of benign growth - partial then complete  . VITAMIN B12 DEFICIENCY 09/2009 dx    Past Surgical History:  Procedure Laterality Date  . CHOLECYSTECTOMY  2009  . DIAGNOSTIC LAPAROSCOPY  1982   diagnostic  . POSTERIOR FUSION LUMBAR SPINE  06/2011  . THYROID SURGERY  1983   partial   . TOTAL THYROIDECTOMY  2000   Had thyroid nodule, nonmalignant ([id number]) resection  . TUBAL LIGATION  1989    Social History   Socioeconomic History  . Marital status: Married    Spouse name: Not on file  . Number of children: 3  . Years of education: Not on file  . Highest education level: Not on file  Occupational History  . Not on file  Social Needs  . Financial resource strain: Not on file  . Food insecurity:    Worry: Not on file    Inability: Not on file  . Transportation needs:    Medical: Not on file    Non-medical: Not on file  Tobacco Use  . Smoking status: Never Smoker  . Smokeless tobacco: Never Used  Substance and Sexual Activity  . Alcohol use: No    Alcohol/week: 0.0 standard drinks  . Drug use: No  . Sexual activity: Yes    Birth control/protection: Surgical    Comment: Married lives with spouse, 3 children. 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 Physical activity:    Days per week: Not on file    Minutes per session: Not on file  . Stress: Not on file  Relationships  . Social connections:    Talks on phone: Not on file    Gets together: Not on file    Attends religious service: Not on file    Active member of club or  organization: Not on file    Attends meetings of clubs or organizations: Not on file    Relationship status: Not on file  Other Topics Concern  . Not on file  Social History Narrative   Ongoing divorce from husband, separated since 05/2014. Lives alone     Works at [employer] and [employer].  Education: high school 12th grade.      Exercises  regularly    Family History  Problem Relation Age of Onset  . Hyperlipidemia Mother   . Heart disease Mother 2064       massive MI  . Hyperlipidemia Father   . COPD Father        severe, smoker  . Breast cancer Maternal Grandmother   . Hyperlipidemia Maternal Grandmother   . Coronary artery disease Maternal Grandmother 78       CABG  . Ovarian cancer Other   . Anesthesia problems Neg Hx   . Malignant hyperthermia Neg Hx   . Pseudochol deficiency Neg Hx   . Hypotension Neg Hx     Review of Systems  Constitutional: Negative for chills and fever.  Gastrointestinal: Negative for constipation.       No fecal incontincence  Genitourinary:       No urinary incontinence  Musculoskeletal: Positive for back pain. Negative for myalgias (no spasms).  Neurological: Positive for numbness.       Objective:   Vitals:   03/03/18 0911  BP: 124/84  Pulse: (!) 101  Resp: 16  Temp: 98.1 F (36.7 C)  SpO2: 97%   BP Readings from Last 3 Encounters:  03/03/18 124/84  03/01/18 118/88  01/27/18 122/84   Wt Readings from Last 3 Encounters:  03/03/18 150 lb (68 kg)  03/01/18 151 lb 0.6 oz (68.5 kg)  01/27/18 149 lb (67.6 kg)   Body mass index is 26.57 kg/m.   Physical Exam  Constitutional: She is oriented to person, place, and time. She appears well-developed and well-nourished. She appears distressed (in moderate pain).  HENT:  Head: Normocephalic and atraumatic.  Abdominal: Bowel sounds are normal.  Musculoskeletal: She exhibits no edema.  No lumbar or sacral deformity, tenderness with palpation of sacrum and right buttock to right hip; pain  with movement of right leg and putting pressure on right buttock or right leg with standing  Neurological: She is alert and oriented to person, place, and time. A sensory deficit (decreased sensation in RLE, normal sensation in LLE) is present. She exhibits normal muscle tone.  Gait slow and with a limp; unable to appreciate strength in LE due to severe pain  Skin: Skin is warm and dry. She is not diaphoretic.           Assessment & Plan:    See Problem List for Assessment and Plan of chronic medical problems.

## 2018-03-03 ENCOUNTER — Encounter: Payer: Self-pay | Admitting: Internal Medicine

## 2018-03-03 ENCOUNTER — Ambulatory Visit: Payer: 59 | Admitting: Internal Medicine

## 2018-03-03 VITALS — BP 124/84 | HR 101 | Temp 98.1°F | Resp 16 | Wt 150.0 lb

## 2018-03-03 DIAGNOSIS — M5416 Radiculopathy, lumbar region: Secondary | ICD-10-CM | POA: Diagnosis not present

## 2018-03-03 NOTE — Assessment & Plan Note (Signed)
Having severe lower back pain with radiculopathy that has worsened since her last visit She has numbness in her toes and at times during her leg Since her last visit her pain and numbness has worsened despite steroids and pain medication She does have decreased sensation on exam.  Weakness is difficult to appreciate secondary to severe pain Concern for herniated disc and nerve impingement MRI stat Stat referral to orthopedics Deferred further pain medication-has Norco at home as needed, but she prefers to avoid it

## 2018-03-03 NOTE — Patient Instructions (Signed)
An MRI of the lower back was ordered.    A referral for orthopedics was ordered.     A note was given for work.

## 2018-03-04 ENCOUNTER — Encounter: Payer: Self-pay | Admitting: Internal Medicine

## 2018-03-04 MED ORDER — PREDNISONE 10 MG (21) PO TBPK: ORAL_TABLET | ORAL | 0 refills | Status: DC

## 2018-03-04 NOTE — Telephone Encounter (Signed)
I spoke with patient and info given about script. In the meantime she wants to see if [place] [facility]. Can you update her order and see if Corrie DandyMary or Lawson FiscalLori can get her in sooner?  Thanks

## 2018-03-04 NOTE — Telephone Encounter (Signed)
Lawson FiscalLori - can this be changed on your end or do I need to reorder it?

## 2018-03-04 NOTE — Telephone Encounter (Signed)
Can you let her know I am sorry for the confusion about her oral steroids? I am sending in a prednisone pak for her.

## 2018-03-08 ENCOUNTER — Telehealth: Payer: Self-pay | Admitting: Internal Medicine

## 2018-03-08 NOTE — Telephone Encounter (Signed)
Patients recent OV was on 03/03/18 with you. Please advise if you are ok for me to write work note.

## 2018-03-08 NOTE — Telephone Encounter (Signed)
Copied from CRM [phone]. Topic: General - Other >> Mar 08, 2018  8:56 AM Gerrianne ScalePayne, Angela L wrote: Reason for CRM: pt calling stating that she need a out of work note from Friday 16th through Monday 26th until she see the Orthopedic Surgery that's on Monday please give her a call at [phone]

## 2018-03-08 NOTE — Telephone Encounter (Signed)
Yes, ok to write note. Thanks.

## 2018-03-08 NOTE — Telephone Encounter (Signed)
Letter done. Pt is aware. She stated that she will get letter off my chart.

## 2018-03-09 DIAGNOSIS — Z981 Arthrodesis status: Secondary | ICD-10-CM | POA: Diagnosis not present

## 2018-03-09 DIAGNOSIS — M5116 Intervertebral disc disorders with radiculopathy, lumbar region: Secondary | ICD-10-CM | POA: Diagnosis not present

## 2018-03-11 ENCOUNTER — Telehealth: Payer: Self-pay | Admitting: Internal Medicine

## 2018-03-11 ENCOUNTER — Encounter: Payer: Self-pay | Admitting: Internal Medicine

## 2018-03-11 NOTE — Telephone Encounter (Signed)
Pt aware of results. Has an appointment to see the orthopedic specialist on Monday.

## 2018-03-11 NOTE — Telephone Encounter (Signed)
MRI of her lower back shows a cetnral disc protrusion at L3-L4 with mild canal stenosis.  She has arthritic changes at L5 that is touching her L5 nerve.    She has an appointment with orthopedics - hopefully soon and they can review further.

## 2018-03-12 ENCOUNTER — Other Ambulatory Visit: Payer: 59

## 2018-03-15 DIAGNOSIS — M533 Sacrococcygeal disorders, not elsewhere classified: Secondary | ICD-10-CM | POA: Diagnosis not present

## 2018-04-07 DIAGNOSIS — M9904 Segmental and somatic dysfunction of sacral region: Secondary | ICD-10-CM | POA: Diagnosis not present

## 2018-04-23 DIAGNOSIS — M9904 Segmental and somatic dysfunction of sacral region: Secondary | ICD-10-CM | POA: Diagnosis not present

## 2018-04-23 DIAGNOSIS — Z981 Arthrodesis status: Secondary | ICD-10-CM | POA: Diagnosis not present

## 2018-04-23 DIAGNOSIS — M79605 Pain in left leg: Secondary | ICD-10-CM | POA: Diagnosis not present

## 2018-05-06 DIAGNOSIS — M79604 Pain in right leg: Secondary | ICD-10-CM | POA: Diagnosis not present

## 2018-05-10 DIAGNOSIS — M79604 Pain in right leg: Secondary | ICD-10-CM | POA: Diagnosis not present

## 2018-05-13 DIAGNOSIS — M79604 Pain in right leg: Secondary | ICD-10-CM | POA: Diagnosis not present

## 2018-05-17 DIAGNOSIS — M79604 Pain in right leg: Secondary | ICD-10-CM | POA: Diagnosis not present

## 2018-05-18 DIAGNOSIS — M9904 Segmental and somatic dysfunction of sacral region: Secondary | ICD-10-CM | POA: Diagnosis not present

## 2018-05-20 DIAGNOSIS — M79604 Pain in right leg: Secondary | ICD-10-CM | POA: Diagnosis not present

## 2018-05-20 DIAGNOSIS — M5416 Radiculopathy, lumbar region: Secondary | ICD-10-CM | POA: Diagnosis not present

## 2018-05-20 DIAGNOSIS — M961 Postlaminectomy syndrome, not elsewhere classified: Secondary | ICD-10-CM | POA: Diagnosis not present

## 2018-05-21 ENCOUNTER — Other Ambulatory Visit: Payer: Self-pay | Admitting: Orthopedic Surgery

## 2018-05-21 DIAGNOSIS — M259 Joint disorder, unspecified: Secondary | ICD-10-CM

## 2018-05-27 DIAGNOSIS — M79604 Pain in right leg: Secondary | ICD-10-CM | POA: Diagnosis not present

## 2018-05-31 ENCOUNTER — Ambulatory Visit
Admission: RE | Admit: 2018-05-31 | Discharge: 2018-05-31 | Disposition: A | Discharge status: Home / Self Care | Payer: 59 | Source: Ambulatory Visit | Attending: Orthopedic Surgery | Admitting: Orthopedic Surgery

## 2018-05-31 DIAGNOSIS — M259 Joint disorder, unspecified: Secondary | ICD-10-CM

## 2018-05-31 DIAGNOSIS — M549 Dorsalgia, unspecified: Secondary | ICD-10-CM | POA: Diagnosis not present

## 2018-06-01 DIAGNOSIS — M79604 Pain in right leg: Secondary | ICD-10-CM | POA: Diagnosis not present

## 2018-06-07 DIAGNOSIS — M5416 Radiculopathy, lumbar region: Secondary | ICD-10-CM | POA: Diagnosis not present

## 2018-06-09 NOTE — Progress Notes (Signed)
Subjective:    Patient ID: Melissa Kirby, female    DOB: [birth year], 57 y.o.   MRN: [id number]  HPI She is here for surgical clearance for an outpatient Right SI joint fusion by Dr Shon BatonBrooks.   She did PT and it did not help.  She has had two injections - one helped and one did not.  She has a third injection planned for ASAP.  She takes aleve during the day.  She takes nucynta at night and it helps.  She continues to have pain in the right lower back with radiation down the right leg.  The pain is severe. She has numbness/tingling down the legs.  She can not stand or sit for any long period of time.  She has been taken out of work until further notice.     She has had surgery in the past and done well with anesthesia.  She denies any personal or family history of complications from anesthesia.  She denies any personal or family history of bleeding or blood clot problems.  Due to her pain she is fairly inactive.  She denies any chest pain, palpitations or shortness of breath with her day-to-day activities.  Medications and allergies reviewed with patient and updated if appropriate.  Patient Active Problem List   Diagnosis Date Noted  . Lumbar back pain with radiculopathy affecting right lower extremity 03/03/2018  . Insomnia 12/25/2017  . Anxiety 11/27/2017  . Osteopenia 08/19/2017  . Gluten intolerance 08/12/2017  . Exercise-induced asthma 08/12/2017  . Abnormal results of liver function studies 04/06/2017  . Elevated liver enzymes 03/30/2017  . Intracranial hypotension 01/24/2014  . Vitamin D deficiency   . Abnormal brain MRI 10/15/2013  . Elevated blood pressure reading 10/13/2013  . LBP (low back pain) 05/10/2012  . Fibromyalgia   . Fatigue 01/17/2011  . Atrophic vaginitis 10/17/2010  . Depression 10/17/2010  . Vitamin B12 deficiency (non anemic) 10/04/2009  . Hypothyroidism 10/03/2009  . Migraine headache 10/03/2009    Current Outpatient Medications on File Prior to Visit   Medication Sig Dispense Refill  . levothyroxine (SYNTHROID, LEVOTHROID) 125 MCG tablet Take 1 tablet (125 mcg total) by mouth daily. 90 tablet 3  . nortriptyline (PAMELOR) 50 MG capsule TAKE 2 CAPSULES (100 MG TOTAL) BY MOUTH AT BEDTIME. 180 capsule 1  . tapentadol (NUCYNTA) 50 MG tablet Nucynta 50 mg tablet  Take 1 tablet 3 times a day by oral route as needed.     No current facility-administered medications on file prior to visit.     Past Medical History:  Diagnosis Date  . ANEMIA, IRON DEFICIENCY   . Cerebrospinal fluid leak from spinal puncture    im[facility] 05/2014  . Depression    therapy  . Fibromyalgia 1999   no meds  . GASTRITIS 11/12/2009   Qualifier: Diagnosis of  By: Theadora Ramaellis RN, Silvio PateShelia    . GERD (gastroesophageal reflux disease)    per endo. - does not take any meds.    . Kidney stones 1998, 2005   x 2  . MIGRAINE HEADACHE    last Migraine 06/29/2011-   . Unspecified hypothyroidism 1982/2000   surgical resection of benign growth - partial then complete  . VITAMIN B12 DEFICIENCY 09/2009 dx    Past Surgical History:  Procedure Laterality Date  . CHOLECYSTECTOMY  2009  . DIAGNOSTIC LAPAROSCOPY  1982   diagnostic  . POSTERIOR FUSION LUMBAR SPINE  06/2011  . THYROID SURGERY  1983   partial   . TOTAL THYROIDECTOMY  2000   Had thyroid nodule, nonmalignant ([id number]) resection  . TUBAL LIGATION  1989    Social History   Socioeconomic History  . Marital status: Married    Spouse name: Not on file  . Number of children: 3  . Years of education: Not on file  . Highest education level: Not on file  Occupational History  . Not on file  Social Needs  . Financial resource strain: Not on file  . Food insecurity:    Worry: Not on file    Inability: Not on file  . Transportation needs:    Medical: Not on file    Non-medical: Not on file  Tobacco Use  . Smoking status: Never Smoker  . Smokeless tobacco: Never Used  Substance and Sexual  Activity  . Alcohol use: No    Alcohol/week: 0.0 standard drinks  . Drug use: No  . Sexual activity: Yes    Birth control/protection: Surgical    Comment: Married lives with spouse, 3 children. 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 Physical activity:    Days per week: Not on file    Minutes per session: Not on file  . Stress: Not on file  Relationships  . Social connections:    Talks on phone: Not on file    Gets together: Not on file    Attends religious service: Not on file    Active member of club or organization: Not on file    Attends meetings of clubs or organizations: Not on file    Relationship status: Not on file  Other Topics Concern  . Not on file  Social History Narrative   Ongoing divorce from husband, separated since 05/2014. Lives alone     Works at [employer] and [employer].  Education: high school 12th grade.      Exercises regularly    Family History  Problem Relation Age of Onset  . Hyperlipidemia Mother   . Heart disease Mother 71       massive MI  . Hyperlipidemia Father   . COPD Father        severe, smoker  . Breast cancer Maternal Grandmother   . Hyperlipidemia Maternal Grandmother   . Coronary artery disease Maternal Grandmother 78       CABG  . Ovarian cancer Other   . Anesthesia problems Neg Hx   . Malignant hyperthermia Neg Hx   . Pseudochol deficiency Neg Hx   . Hypotension Neg Hx     Review of Systems  Constitutional: Negative for chills and fever.  Eyes: Negative for visual disturbance.  Respiratory: Negative for cough, shortness of breath and wheezing.   Cardiovascular: Negative for chest pain, palpitations and leg swelling.  Gastrointestinal: Negative for abdominal pain, blood in stool, constipation, diarrhea and nausea.       No gerd  Genitourinary: Negative for dysuria and hematuria.  Musculoskeletal: Positive for back pain.  Skin: Positive for rash (rough spot under left breast).  Neurological: Positive for  weakness (right leg and foot) and numbness (right leg and foot). Negative for dizziness, light-headedness and headaches.  Psychiatric/Behavioral: Negative for dysphoric mood. The patient is not nervous/anxious.        Objective:   Vitals:   06/10/18 0931  BP: 132/82  Pulse: 90  Resp: 16  Temp: 98 F (36.7 C)  SpO2: 97%   Filed Weights   06/10/18 0931  Weight:  150 lb 12.8 oz (68.4 kg)   Body mass index is 26.71 kg/m.  BP Readings from Last 3 Encounters:  06/10/18 132/82  03/03/18 124/84  03/01/18 118/88    Wt Readings from Last 3 Encounters:  06/10/18 150 lb 12.8 oz (68.4 kg)  03/03/18 150 lb (68 kg)  03/01/18 151 lb 0.6 oz (68.5 kg)     Physical Exam Constitutional: She appears well-developed and well-nourished. No distress.  HENT:  Head: Normocephalic and atraumatic.  Right Ear: External ear normal. Normal ear canal and TM Left Ear: External ear normal.  Normal ear canal and TM Mouth/Throat: Oropharynx is clear and moist.  Eyes: Conjunctivae and EOM are normal.  Neck: Neck supple. No tracheal deviation present. No thyromegaly present.  No carotid bruit  Cardiovascular: Normal rate, regular rhythm and normal heart sounds.   No murmur heard.  No edema. Pulmonary/Chest: Effort normal and breath sounds normal. No respiratory distress. She has no wheezes. She has no rales.  Abdominal: Soft. She exhibits no distension. There is no tenderness.  Lymphadenopathy: She has no cervical adenopathy.  Skin: Skin is warm and dry. She is not diaphoretic.  Psychiatric: She has a normal mood and affect. Her behavior is normal.        Assessment & Plan:     See Problem List for Assessment and Plan of chronic medical problems.

## 2018-06-10 ENCOUNTER — Encounter: Payer: Self-pay | Admitting: Internal Medicine

## 2018-06-10 ENCOUNTER — Ambulatory Visit: Payer: 59 | Admitting: Internal Medicine

## 2018-06-10 VITALS — BP 132/82 | HR 90 | Temp 98.0°F | Resp 16 | Ht 63.0 in | Wt 150.8 lb

## 2018-06-10 DIAGNOSIS — M5416 Radiculopathy, lumbar region: Secondary | ICD-10-CM

## 2018-06-10 DIAGNOSIS — Z01818 Encounter for other preprocedural examination: Secondary | ICD-10-CM | POA: Diagnosis not present

## 2018-06-10 NOTE — Assessment & Plan Note (Signed)
Right sided SI joint pain with radiculopathy down right leg Conservative treatment has not been effective including pain medications, PT, injections Will have Right sided SI joint fusion as an outpatient  She is low risk for a low risk procedure No history, signs/symptoms or cardiac or pulmonary disease No further evaluation or testing needed at this time She is cleared for surgery - letter sent to Dr Shon BatonBrooks

## 2018-06-10 NOTE — Assessment & Plan Note (Signed)
Right sided SI joint pain with radiculopathy down right leg Conservative treatment has not been effective including pain medications, PT, injections Will have Right sided SI joint fusion as an outpatient

## 2018-06-10 NOTE — Patient Instructions (Signed)
     Medications reviewed and updated.  Changes include :   none   Please followup in January as scheduled   

## 2018-06-14 ENCOUNTER — Other Ambulatory Visit: Payer: Self-pay | Admitting: Orthopedic Surgery

## 2018-06-14 DIAGNOSIS — M5416 Radiculopathy, lumbar region: Secondary | ICD-10-CM

## 2018-06-22 ENCOUNTER — Ambulatory Visit
Admission: RE | Admit: 2018-06-22 | Discharge: 2018-06-22 | Disposition: A | Discharge status: Home / Self Care | Payer: 59 | Source: Ambulatory Visit | Attending: Orthopedic Surgery | Admitting: Orthopedic Surgery

## 2018-06-22 DIAGNOSIS — M545 Low back pain: Secondary | ICD-10-CM | POA: Diagnosis not present

## 2018-06-22 DIAGNOSIS — M5416 Radiculopathy, lumbar region: Secondary | ICD-10-CM

## 2018-06-22 MED ORDER — IOPAMIDOL (ISOVUE-M 200) INJECTION 41%
1.0000 mL | Freq: Once | INTRAMUSCULAR | Status: AC
  Administered 2018-06-22: 1 mL via INTRA_ARTICULAR

## 2018-06-28 DIAGNOSIS — M9904 Segmental and somatic dysfunction of sacral region: Secondary | ICD-10-CM | POA: Diagnosis not present

## 2018-06-28 DIAGNOSIS — M5416 Radiculopathy, lumbar region: Secondary | ICD-10-CM | POA: Diagnosis not present

## 2018-07-09 DIAGNOSIS — M533 Sacrococcygeal disorders, not elsewhere classified: Secondary | ICD-10-CM | POA: Diagnosis not present

## 2018-07-09 DIAGNOSIS — M5416 Radiculopathy, lumbar region: Secondary | ICD-10-CM | POA: Diagnosis not present

## 2018-08-13 ENCOUNTER — Encounter: Payer: 59 | Admitting: Internal Medicine

## 2018-08-20 DIAGNOSIS — Z4889 Encounter for other specified surgical aftercare: Secondary | ICD-10-CM | POA: Diagnosis not present

## 2018-08-20 DIAGNOSIS — M5416 Radiculopathy, lumbar region: Secondary | ICD-10-CM | POA: Diagnosis not present

## 2018-08-20 DIAGNOSIS — M9904 Segmental and somatic dysfunction of sacral region: Secondary | ICD-10-CM | POA: Diagnosis not present

## 2018-08-24 DIAGNOSIS — R2689 Other abnormalities of gait and mobility: Secondary | ICD-10-CM | POA: Diagnosis not present

## 2018-08-24 DIAGNOSIS — Z4889 Encounter for other specified surgical aftercare: Secondary | ICD-10-CM | POA: Diagnosis not present

## 2018-08-24 DIAGNOSIS — M6281 Muscle weakness (generalized): Secondary | ICD-10-CM | POA: Diagnosis not present

## 2018-08-26 DIAGNOSIS — M6281 Muscle weakness (generalized): Secondary | ICD-10-CM | POA: Diagnosis not present

## 2018-08-26 DIAGNOSIS — R2689 Other abnormalities of gait and mobility: Secondary | ICD-10-CM | POA: Diagnosis not present

## 2018-08-26 DIAGNOSIS — Z4889 Encounter for other specified surgical aftercare: Secondary | ICD-10-CM | POA: Diagnosis not present

## 2018-08-27 DIAGNOSIS — M6281 Muscle weakness (generalized): Secondary | ICD-10-CM | POA: Diagnosis not present

## 2018-08-27 DIAGNOSIS — Z4889 Encounter for other specified surgical aftercare: Secondary | ICD-10-CM | POA: Diagnosis not present

## 2018-08-27 DIAGNOSIS — R2689 Other abnormalities of gait and mobility: Secondary | ICD-10-CM | POA: Diagnosis not present

## 2018-08-30 DIAGNOSIS — R2689 Other abnormalities of gait and mobility: Secondary | ICD-10-CM | POA: Diagnosis not present

## 2018-08-30 DIAGNOSIS — M6281 Muscle weakness (generalized): Secondary | ICD-10-CM | POA: Diagnosis not present

## 2018-08-30 DIAGNOSIS — Z4889 Encounter for other specified surgical aftercare: Secondary | ICD-10-CM | POA: Diagnosis not present

## 2018-09-01 DIAGNOSIS — R2689 Other abnormalities of gait and mobility: Secondary | ICD-10-CM | POA: Diagnosis not present

## 2018-09-01 DIAGNOSIS — M6281 Muscle weakness (generalized): Secondary | ICD-10-CM | POA: Diagnosis not present

## 2018-09-01 DIAGNOSIS — Z4889 Encounter for other specified surgical aftercare: Secondary | ICD-10-CM | POA: Diagnosis not present

## 2018-09-03 DIAGNOSIS — M6281 Muscle weakness (generalized): Secondary | ICD-10-CM | POA: Diagnosis not present

## 2018-09-03 DIAGNOSIS — Z4889 Encounter for other specified surgical aftercare: Secondary | ICD-10-CM | POA: Diagnosis not present

## 2018-09-03 DIAGNOSIS — R2689 Other abnormalities of gait and mobility: Secondary | ICD-10-CM | POA: Diagnosis not present

## 2018-09-06 DIAGNOSIS — M6281 Muscle weakness (generalized): Secondary | ICD-10-CM | POA: Diagnosis not present

## 2018-09-06 DIAGNOSIS — R2689 Other abnormalities of gait and mobility: Secondary | ICD-10-CM | POA: Diagnosis not present

## 2018-09-06 DIAGNOSIS — Z4889 Encounter for other specified surgical aftercare: Secondary | ICD-10-CM | POA: Diagnosis not present

## 2018-09-08 DIAGNOSIS — R2689 Other abnormalities of gait and mobility: Secondary | ICD-10-CM | POA: Diagnosis not present

## 2018-09-08 DIAGNOSIS — Z4889 Encounter for other specified surgical aftercare: Secondary | ICD-10-CM | POA: Diagnosis not present

## 2018-09-08 DIAGNOSIS — M6281 Muscle weakness (generalized): Secondary | ICD-10-CM | POA: Diagnosis not present

## 2018-09-10 DIAGNOSIS — Z4889 Encounter for other specified surgical aftercare: Secondary | ICD-10-CM | POA: Diagnosis not present

## 2018-09-10 DIAGNOSIS — M6281 Muscle weakness (generalized): Secondary | ICD-10-CM | POA: Diagnosis not present

## 2018-09-10 DIAGNOSIS — R2689 Other abnormalities of gait and mobility: Secondary | ICD-10-CM | POA: Diagnosis not present

## 2018-09-13 DIAGNOSIS — M6281 Muscle weakness (generalized): Secondary | ICD-10-CM | POA: Diagnosis not present

## 2018-09-13 DIAGNOSIS — R2689 Other abnormalities of gait and mobility: Secondary | ICD-10-CM | POA: Diagnosis not present

## 2018-09-13 DIAGNOSIS — Z4889 Encounter for other specified surgical aftercare: Secondary | ICD-10-CM | POA: Diagnosis not present

## 2018-09-15 DIAGNOSIS — Z4889 Encounter for other specified surgical aftercare: Secondary | ICD-10-CM | POA: Diagnosis not present

## 2018-09-15 DIAGNOSIS — R2689 Other abnormalities of gait and mobility: Secondary | ICD-10-CM | POA: Diagnosis not present

## 2018-09-15 DIAGNOSIS — M6281 Muscle weakness (generalized): Secondary | ICD-10-CM | POA: Diagnosis not present

## 2018-09-19 NOTE — Patient Instructions (Addendum)
Zyrtec or Xyzal are the best anti-histamines - take at night  Tests ordered today. Your results will be released to [facility] (or called to you) after review, usually within 72hours after test completion. If any changes need to be made, you will be notified at that same time.  All other Health Maintenance issues reviewed.   All recommended immunizations and age-appropriate screenings are up-to-date or discussed.  No immunizations administered today.   Medications reviewed and updated.  Changes include :   none  Your prescription(s) have been submitted to your pharmacy. Please take as directed and contact our office if you believe you are having problem(s) with the medication(s).   Please followup in one year   Health Maintenance, Female Adopting a healthy lifestyle and getting preventive care can go a long way to promote health and wellness. Talk with your health care provider about what schedule of regular examinations is right for you. This is a good chance for you to check in with your provider about disease prevention and staying healthy. In between checkups, there are plenty of things you can do on your own. Experts have done a lot of research about which lifestyle changes and preventive measures are most likely to keep you healthy. Ask your health care provider for more information. Weight and diet Eat a healthy diet  Be sure to include plenty of vegetables, fruits, low-fat dairy products, and lean protein.  Do not eat a lot of foods high in solid fats, added sugars, or salt.  Get regular exercise. This is one of the most important things you can do for your health. ? Most adults should exercise for at least 150 minutes each week. The exercise should increase your heart rate and make you sweat (moderate-intensity exercise). ? Most adults should also do strengthening exercises at least twice a week. This is in addition to the moderate-intensity exercise. Maintain a healthy  weight  Body mass index (BMI) is a measurement that can be used to identify possible weight problems. It estimates body fat based on height and weight. Your health care provider can help determine your BMI and help you achieve or maintain a healthy weight.  For females 19 years of age and older: ? A BMI below 18.5 is considered underweight. ? A BMI of 18.5 to 24.9 is normal. ? A BMI of 25 to 29.9 is considered overweight. ? A BMI of 30 and above is considered obese. Watch levels of cholesterol and blood lipids  You should start having your blood tested for lipids and cholesterol at 57 years of age, then have this test every 5 years.  You may need to have your cholesterol levels checked more often if: ? Your lipid or cholesterol levels are high. ? You are older than 57 years of age. ? You are at high risk for heart disease. Cancer screening Lung Cancer  Lung cancer screening is recommended for adults 16-75 years old who are at high risk for lung cancer because of a history of smoking.  A yearly low-dose CT scan of the lungs is recommended for people who: ? Currently smoke. ? Have quit within the past 15 years. ? Have at least a 30-pack-year history of smoking. A pack year is smoking an average of one pack of cigarettes a day for 1 year.  Yearly screening should continue until it has been 15 years since you quit.  Yearly screening should stop if you develop a health problem that would prevent you from having lung  cancer treatment. Breast Cancer  Practice breast self-awareness. This means understanding how your breasts normally appear and feel.  It also means doing regular breast self-exams. Let your health care provider know about any changes, no matter how small.  If you are in your 20s or 30s, you should have a clinical breast exam (CBE) by a health care provider every 1-3 years as part of a regular health exam.  If you are 60 or older, have a CBE every year. Also consider  having a breast X-ray (mammogram) every year.  If you have a family history of breast cancer, talk to your health care provider about genetic screening.  If you are at high risk for breast cancer, talk to your health care provider about having an MRI and a mammogram every year.  Breast cancer gene (BRCA) assessment is recommended for women who have family members with BRCA-related cancers. BRCA-related cancers include: ? Breast. ? Ovarian. ? Tubal. ? Peritoneal cancers.  Results of the assessment will determine the need for genetic counseling and BRCA1 and BRCA2 testing. Cervical Cancer Your health care provider may recommend that you be screened regularly for cancer of the pelvic organs (ovaries, uterus, and vagina). This screening involves a pelvic examination, including checking for microscopic changes to the surface of your cervix (Pap test). You may be encouraged to have this screening done every 3 years, beginning at age 24.  For women ages 12-65, health care providers may recommend pelvic exams and Pap testing every 3 years, or they may recommend the Pap and pelvic exam, combined with testing for human papilloma virus (HPV), every 5 years. Some types of HPV increase your risk of cervical cancer. Testing for HPV may also be done on women of any age with unclear Pap test results.  Other health care providers may not recommend any screening for nonpregnant women who are considered low risk for pelvic cancer and who do not have symptoms. Ask your health care provider if a screening pelvic exam is right for you.  If you have had past treatment for cervical cancer or a condition that could lead to cancer, you need Pap tests and screening for cancer for at least 20 years after your treatment. If Pap tests have been discontinued, your risk factors (such as having a new sexual partner) need to be reassessed to determine if screening should resume. Some women have medical problems that increase the  chance of getting cervical cancer. In these cases, your health care provider may recommend more frequent screening and Pap tests. Colorectal Cancer  This type of cancer can be detected and often prevented.  Routine colorectal cancer screening usually begins at 56 years of age and continues through 57 years of age.  Your health care provider may recommend screening at an earlier age if you have risk factors for colon cancer.  Your health care provider may also recommend using home test kits to check for hidden blood in the stool.  A small camera at the end of a tube can be used to examine your colon directly (sigmoidoscopy or colonoscopy). This is done to check for the earliest forms of colorectal cancer.  Routine screening usually begins at age 32.  Direct examination of the colon should be repeated every 5-10 years through 57 years of age. However, you may need to be screened more often if early forms of precancerous polyps or small growths are found. Skin Cancer  Check your skin from head to toe regularly.  Tell your  health care provider about any new moles or changes in moles, especially if there is a change in a mole's shape or color.  Also tell your health care provider if you have a mole that is larger than the size of a pencil eraser.  Always use sunscreen. Apply sunscreen liberally and repeatedly throughout the day.  Protect yourself by wearing long sleeves, pants, a wide-brimmed hat, and sunglasses whenever you are outside. Heart disease, diabetes, and high blood pressure  High blood pressure causes heart disease and increases the risk of stroke. High blood pressure is more likely to develop in: ? People who have blood pressure in the high end of the normal range (130-139/85-89 mm Hg). ? People who are overweight or obese. ? People who are African American.  If you are 13-42 years of age, have your blood pressure checked every 3-5 years. If you are 11 years of age or older,  have your blood pressure checked every year. You should have your blood pressure measured twice-once when you are at a hospital or clinic, and once when you are not at a hospital or clinic. Record the average of the two measurements. To check your blood pressure when you are not at a hospital or clinic, you can use: ? An automated blood pressure machine at a pharmacy. ? A home blood pressure monitor.  If you are between 50 years and 42 years old, ask your health care provider if you should take aspirin to prevent strokes.  Have regular diabetes screenings. This involves taking a blood sample to check your fasting blood sugar level. ? If you are at a normal weight and have a low risk for diabetes, have this test once every three years after 57 years of age. ? If you are overweight and have a high risk for diabetes, consider being tested at a younger age or more often. Preventing infection Hepatitis B  If you have a higher risk for hepatitis B, you should be screened for this virus. You are considered at high risk for hepatitis B if: ? You were born in a country where hepatitis B is common. Ask your health care provider which countries are considered high risk. ? Your parents were born in a high-risk country, and you have not been immunized against hepatitis B (hepatitis B vaccine). ? You have HIV or AIDS. ? You use needles to inject street drugs. ? You live with someone who has hepatitis B. ? You have had sex with someone who has hepatitis B. ? You get hemodialysis treatment. ? You take certain medicines for conditions, including cancer, organ transplantation, and autoimmune conditions. Hepatitis C  Blood testing is recommended for: ? Everyone born from [birth year] through 1964. ? Anyone with known risk factors for hepatitis C. Sexually transmitted infections (STIs)  You should be screened for sexually transmitted infections (STIs) including gonorrhea and chlamydia if: ? You are sexually active  and are younger than 57 years of age. ? You are older than 57 years of age and your health care provider tells you that you are at risk for this type of infection. ? Your sexual activity has changed since you were last screened and you are at an increased risk for chlamydia or gonorrhea. Ask your health care provider if you are at risk.  If you do not have HIV, but are at risk, it may be recommended that you take a prescription medicine daily to prevent HIV infection. This is called pre-exposure prophylaxis (PrEP). You are  considered at risk if: ? You are sexually active and do not regularly use condoms or know the HIV status of your partner(s). ? You take drugs by injection. ? You are sexually active with a partner who has HIV. Talk with your health care provider about whether you are at high risk of being infected with HIV. If you choose to begin PrEP, you should first be tested for HIV. You should then be tested every 3 months for as long as you are taking PrEP. Pregnancy  If you are premenopausal and you may become pregnant, ask your health care provider about preconception counseling.  If you may become pregnant, take 400 to 800 micrograms (mcg) of folic acid every day.  If you want to prevent pregnancy, talk to your health care provider about birth control (contraception). Osteoporosis and menopause  Osteoporosis is a disease in which the bones lose minerals and strength with aging. This can result in serious bone fractures. Your risk for osteoporosis can be identified using a bone density scan.  If you are 43 years of age or older, or if you are at risk for osteoporosis and fractures, ask your health care provider if you should be screened.  Ask your health care provider whether you should take a calcium or vitamin D supplement to lower your risk for osteoporosis.  Menopause may have certain physical symptoms and risks.  Hormone replacement therapy may reduce some of these symptoms  and risks. Talk to your health care provider about whether hormone replacement therapy is right for you. Follow these instructions at home:  Schedule regular health, dental, and eye exams.  Stay current with your immunizations.  Do not use any tobacco products including cigarettes, chewing tobacco, or electronic cigarettes.  If you are pregnant, do not drink alcohol.  If you are breastfeeding, limit how much and how often you drink alcohol.  Limit alcohol intake to no more than 1 drink per day for nonpregnant women. One drink equals 12 ounces of beer, 5 ounces of wine, or 1 ounces of hard liquor.  Do not use street drugs.  Do not share needles.  Ask your health care provider for help if you need support or information about quitting drugs.  Tell your health care provider if you often feel depressed.  Tell your health care provider if you have ever been abused or do not feel safe at home. This information is not intended to replace advice given to you by your health care provider. Make sure you discuss any questions you have with your health care provider. Document Released: 01/20/2011 Document Revised: 12/13/2015 Document Reviewed: 04/10/2015 Elsevier Interactive Patient Education  2019 [employer].

## 2018-09-19 NOTE — Progress Notes (Signed)
Subjective:    Patient ID: Melissa Kirby, female    DOB: July 19, 1961, 58 y.o.   MRN: [id number]  HPI She is here for a physical exam.   She had back surgery 06/2018 and she is doing PT.  She will likely be able to go back to work soon.  She will probably need to have surgery again at some point in the future, but not anytime soon.   If she sits too long her right foot and leg goes numb, so she is up and down.   Her pain is tolerable and she feels she is doing well overall.  She has no other concerns.    Medications and allergies reviewed with patient and updated if appropriate.  Patient Active Problem List   Diagnosis Date Noted  . Pre-op examination 06/10/2018  . Lumbar radiculopathy 05/20/2018  . Lumbar back pain with radiculopathy affecting right lower extremity 03/03/2018  . Insomnia 12/25/2017  . Anxiety 11/27/2017  . Osteopenia 08/19/2017  . Gluten intolerance 08/12/2017  . Exercise-induced asthma 08/12/2017  . Abnormal results of liver function studies 04/06/2017  . Elevated liver enzymes 03/30/2017  . Intracranial hypotension 01/24/2014  . Vitamin D deficiency   . Abnormal brain MRI 10/15/2013  . Elevated blood pressure reading 10/13/2013  . LBP (low back pain) 05/10/2012  . Fibromyalgia   . Fatigue 01/17/2011  . Atrophic vaginitis 10/17/2010  . Depression 10/17/2010  . Vitamin B12 deficiency (non anemic) 10/04/2009  . Hypothyroidism 10/03/2009  . Migraine headache 10/03/2009    Current Outpatient Medications on File Prior to Visit  Medication Sig Dispense Refill  . levothyroxine (SYNTHROID, LEVOTHROID) 125 MCG tablet Take 1 tablet (125 mcg total) by mouth daily. 90 tablet 3  . nortriptyline (PAMELOR) 50 MG capsule TAKE 2 CAPSULES (100 MG TOTAL) BY MOUTH AT BEDTIME. 180 capsule 1   No current facility-administered medications on file prior to visit.     Past Medical History:  Diagnosis Date  . ANEMIA, IRON DEFICIENCY   . Cerebrospinal fluid leak from spinal  puncture    improved with 7th patch at [facility] 05/2014  . Depression    therapy  . Fibromyalgia 1999   no meds  . GASTRITIS 11/12/2009   Qualifier: Diagnosis of  By: Theadora Rama RN, Silvio Pate    . GERD (gastroesophageal reflux disease)    per endo. - does not take any meds.    . Kidney stones 1998, 2005   x 2  . MIGRAINE HEADACHE    last Migraine 06/29/2011-   . Unspecified hypothyroidism 1982/2000   surgical resection of benign growth - partial then complete  . VITAMIN B12 DEFICIENCY 09/2009 dx    Past Surgical History:  Procedure Laterality Date  . CHOLECYSTECTOMY  2009  . DIAGNOSTIC LAPAROSCOPY  1982   diagnostic  . POSTERIOR FUSION LUMBAR SPINE  06/2011  . THYROID SURGERY  1983   partial   . TOTAL THYROIDECTOMY  2000   Had thyroid nodule, nonmalignant ([id number]) resection  . TUBAL LIGATION  1989    Social History   Socioeconomic History  . Marital status: Married    Spouse name: Not on file  . Number of children: 3  . Years of education: Not on file  . Highest education level: Not on file  Occupational History  . Not on file  Social Needs  . Financial resource strain: Not on file  . Food insecurity:    Worry: Not on file    Inability: Not on  file  . Transportation needs:    Medical: Not on file    Non-medical: Not on file  Tobacco Use  . Smoking status: Never Smoker  . Smokeless tobacco: Never Used  Substance and Sexual Activity  . Alcohol use: No    Alcohol/week: 0.0 standard drinks  . Drug use: No  . Sexual activity: Yes    Birth control/protection: Surgical    Comment: Married lives with spouse, 3 children. 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 Physical activity:    Days per week: Not on file    Minutes per session: Not on file  . Stress: Not on file  Relationships  . Social connections:    Talks on phone: Not on file    Gets together: Not on file    Attends religious service: Not on file    Active member of club or organization:  Not on file    Attends meetings of clubs or organizations: Not on file    Relationship status: Not on file  Other Topics Concern  . Not on file  Social History Narrative   Ongoing divorce from husband, separated since 05/2014. Lives alone     Works at [employer] and [employer].  Education: high school 12th grade.      Exercises regularly    Family History  Problem Relation Age of Onset  . Hyperlipidemia Mother   . Heart disease Mother 81       massive MI  . Hyperlipidemia Father   . COPD Father        severe, smoker  . Breast cancer Maternal Grandmother   . Hyperlipidemia Maternal Grandmother   . Coronary artery disease Maternal Grandmother 78       CABG  . Ovarian cancer Other   . Anesthesia problems Neg Hx   . Malignant hyperthermia Neg Hx   . Pseudochol deficiency Neg Hx   . Hypotension Neg Hx     Review of Systems  Constitutional: Negative for chills and fever.  HENT: Positive for postnasal drip and sneezing.   Eyes: Negative for visual disturbance.  Respiratory: Negative for cough, shortness of breath and wheezing.   Cardiovascular: Negative for chest pain, palpitations and leg swelling.  Gastrointestinal: Negative for abdominal pain, blood in stool, constipation, diarrhea and nausea.       No gerd  Genitourinary: Negative for dysuria and hematuria.  Musculoskeletal: Positive for back pain and neck pain (mild). Negative for arthralgias.  Skin: Negative for color change.  Neurological: Negative for light-headedness and headaches.  Psychiatric/Behavioral: Negative for dysphoric mood. The patient is not nervous/anxious.        Objective:   Vitals:   09/20/18 1453  BP: (!) 142/100  Pulse: 93  Resp: 16  Temp: 98.6 F (37 C)  SpO2: 98%   Filed Weights   09/20/18 1453  Weight: 150 lb 1.9 oz (68.1 kg)   Body mass index is 26.59 kg/m.  BP Readings from Last 3 Encounters:  09/20/18 (!) 142/100  06/10/18 132/82  03/03/18 124/84    Wt Readings from Last 3  Encounters:  09/20/18 150 lb 1.9 oz (68.1 kg)  06/10/18 150 lb 12.8 oz (68.4 kg)  03/03/18 150 lb (68 kg)     Physical Exam Constitutional: She appears well-developed and well-nourished. No distress.  HENT:  Head: Normocephalic and atraumatic.  Right Ear: External ear normal. Normal ear canal and TM Left Ear: External ear normal.  Normal ear canal and TM Mouth/Throat:  Oropharynx is clear and moist.  Eyes: Conjunctivae and EOM are normal.  Neck: Neck supple. No tracheal deviation present. No thyromegaly present.  No carotid bruit  Cardiovascular: Normal rate, regular rhythm and normal heart sounds.   No murmur heard.  No edema. Pulmonary/Chest: Effort normal and breath sounds normal. No respiratory distress. She has no wheezes. She has no rales.  Breast: deferred to Gyn Abdominal: Soft. She exhibits no distension. There is no tenderness.  Lymphadenopathy: She has no cervical adenopathy.  Skin: Skin is warm and dry. She is not diaphoretic.  Psychiatric: She has a normal mood and affect. Her behavior is normal.        Assessment & Plan:   Physical exam: Screening blood work  ordered Immunizations  Discussed shingrix, others up to date Colonoscopy   Up to date  Mammogram   Up to date  Gyn    Up to date  Dexa    Up to date  Eye exams  Up to date  EKG     Done 03/2017 Exercise  PT and active Weight  Will work on weight loss Skin   No concerns Substance abuse   none  See Problem List for Assessment and Plan of chronic medical problems.   Follow-up annually, sooner if needed

## 2018-09-20 ENCOUNTER — Other Ambulatory Visit (INDEPENDENT_AMBULATORY_CARE_PROVIDER_SITE_OTHER): Payer: 59

## 2018-09-20 ENCOUNTER — Ambulatory Visit (INDEPENDENT_AMBULATORY_CARE_PROVIDER_SITE_OTHER): Payer: 59 | Admitting: Internal Medicine

## 2018-09-20 ENCOUNTER — Encounter: Payer: Self-pay | Admitting: Internal Medicine

## 2018-09-20 VITALS — BP 142/100 | HR 93 | Temp 98.6°F | Resp 16 | Ht 63.0 in | Wt 150.1 lb

## 2018-09-20 DIAGNOSIS — M5416 Radiculopathy, lumbar region: Secondary | ICD-10-CM | POA: Diagnosis not present

## 2018-09-20 DIAGNOSIS — Z8619 Personal history of other infectious and parasitic diseases: Secondary | ICD-10-CM | POA: Insufficient documentation

## 2018-09-20 DIAGNOSIS — R030 Elevated blood-pressure reading, without diagnosis of hypertension: Secondary | ICD-10-CM

## 2018-09-20 DIAGNOSIS — E039 Hypothyroidism, unspecified: Secondary | ICD-10-CM

## 2018-09-20 DIAGNOSIS — Z0000 Encounter for general adult medical examination without abnormal findings: Secondary | ICD-10-CM | POA: Diagnosis not present

## 2018-09-20 DIAGNOSIS — G43809 Other migraine, not intractable, without status migrainosus: Secondary | ICD-10-CM

## 2018-09-20 DIAGNOSIS — M85852 Other specified disorders of bone density and structure, left thigh: Secondary | ICD-10-CM

## 2018-09-20 LAB — COMPREHENSIVE METABOLIC PANEL
ALT: 21 U/L (ref 0–35)
AST: 27 U/L (ref 0–37)
Albumin: 4.4 g/dL (ref 3.5–5.2)
Alkaline Phosphatase: 175 U/L — ABNORMAL HIGH (ref 39–117)
BUN: 9 mg/dL (ref 6–23)
CO2: 25 mEq/L (ref 19–32)
Calcium: 9.8 mg/dL (ref 8.4–10.5)
Chloride: 104 mEq/L (ref 96–112)
Creatinine, Ser: 0.8 mg/dL (ref 0.40–1.20)
GFR: 74.08 mL/min (ref >60.00)
Glucose, Bld: 89 mg/dL (ref 70–99)
Potassium: 3.8 mEq/L (ref 3.5–5.1)
Sodium: 138 mEq/L (ref 135–145)
Total Bilirubin: 0.6 mg/dL (ref 0.2–1.2)
Total Protein: 8 g/dL (ref 6.0–8.3)

## 2018-09-20 LAB — LIPID PANEL
Cholesterol: 207 mg/dL — ABNORMAL HIGH (ref 0–200)
HDL: 53.9 mg/dL (ref >39.00)
LDL Cholesterol: 129 mg/dL — ABNORMAL HIGH (ref 0–99)
NonHDL: 152.84
Total CHOL/HDL Ratio: 4
Triglycerides: 121 mg/dL (ref 0.0–149.0)
VLDL: 24.2 mg/dL (ref 0.0–40.0)

## 2018-09-20 LAB — CBC WITH DIFFERENTIAL/PLATELET
Basophils Absolute: 0.1 10*3/uL (ref 0.0–0.1)
Basophils Relative: 0.7 % (ref 0.0–3.0)
Eosinophils Absolute: 0.1 10*3/uL (ref 0.0–0.7)
Eosinophils Relative: 1.1 % (ref 0.0–5.0)
HCT: 37 % (ref 36.0–46.0)
Hemoglobin: 12.4 g/dL (ref 12.0–15.0)
Lymphocytes Relative: 38.4 % (ref 12.0–46.0)
Lymphs Abs: 3.8 10*3/uL (ref 0.7–4.0)
MCHC: 33.6 g/dL (ref 30.0–36.0)
MCV: 88.6 fl (ref 78.0–100.0)
Monocytes Absolute: 1 10*3/uL (ref 0.1–1.0)
Monocytes Relative: 9.8 % (ref 3.0–12.0)
Neutro Abs: 5 10*3/uL (ref 1.4–7.7)
Neutrophils Relative %: 50 % (ref 43.0–77.0)
Platelets: 282 10*3/uL (ref 150.0–400.0)
RBC: 4.17 Mil/uL (ref 3.87–5.11)
RDW: 13.3 % (ref 11.5–15.5)
WBC: 9.9 10*3/uL (ref 4.0–10.5)

## 2018-09-20 LAB — TSH: TSH: 2.6 u[IU]/mL (ref 0.35–4.50)

## 2018-09-20 MED ORDER — LEVOTHYROXINE SODIUM 125 MCG PO TABS: 125.0000 ug | ORAL_TABLET | Freq: Every day | ORAL | 3 refills | Status: DC

## 2018-09-20 MED ORDER — NORTRIPTYLINE HCL 50 MG PO CAPS
ORAL_CAPSULE | ORAL | 3 refills | Status: DC
Start: 2018-09-20 — End: 2019-09-26

## 2018-09-20 NOTE — Assessment & Plan Note (Signed)
Blood pressure slightly elevated here today, but last 2 measures besides yesterday were normal We will continue to monitor No medication indicated at this time

## 2018-09-20 NOTE — Assessment & Plan Note (Signed)
Clinically euthyroid Check tsh  Titrate med dose if needed  

## 2018-09-20 NOTE — Assessment & Plan Note (Signed)
DEXA up-to-date Will work on increasing activity-currently doing PT

## 2018-09-20 NOTE — Assessment & Plan Note (Addendum)
Had surgery in December Following with PT Pain is tolerable, but still has limitations

## 2018-09-20 NOTE — Assessment & Plan Note (Signed)
Controlled Continue nortriptyline 50 mg nightly

## 2018-09-21 DIAGNOSIS — M6281 Muscle weakness (generalized): Secondary | ICD-10-CM | POA: Diagnosis not present

## 2018-09-21 DIAGNOSIS — R2689 Other abnormalities of gait and mobility: Secondary | ICD-10-CM | POA: Diagnosis not present

## 2018-09-21 DIAGNOSIS — Z4889 Encounter for other specified surgical aftercare: Secondary | ICD-10-CM | POA: Diagnosis not present

## 2018-09-23 ENCOUNTER — Encounter: Payer: Self-pay | Admitting: Internal Medicine

## 2018-09-23 DIAGNOSIS — Z4889 Encounter for other specified surgical aftercare: Secondary | ICD-10-CM | POA: Diagnosis not present

## 2018-09-23 DIAGNOSIS — M6281 Muscle weakness (generalized): Secondary | ICD-10-CM | POA: Diagnosis not present

## 2018-09-23 DIAGNOSIS — R2689 Other abnormalities of gait and mobility: Secondary | ICD-10-CM | POA: Diagnosis not present

## 2018-09-28 DIAGNOSIS — Z4889 Encounter for other specified surgical aftercare: Secondary | ICD-10-CM | POA: Diagnosis not present

## 2018-09-29 ENCOUNTER — Encounter: Payer: Self-pay | Admitting: Internal Medicine

## 2018-10-15 ENCOUNTER — Other Ambulatory Visit: Payer: Self-pay | Admitting: Internal Medicine

## 2019-01-05 ENCOUNTER — Other Ambulatory Visit: Payer: Self-pay | Admitting: Obstetrics and Gynecology

## 2019-01-05 DIAGNOSIS — R928 Other abnormal and inconclusive findings on diagnostic imaging of breast: Secondary | ICD-10-CM

## 2019-01-12 ENCOUNTER — Other Ambulatory Visit: Payer: Self-pay | Admitting: Obstetrics and Gynecology

## 2019-01-12 ENCOUNTER — Other Ambulatory Visit: Payer: Self-pay

## 2019-01-12 ENCOUNTER — Ambulatory Visit
Admission: RE | Admit: 2019-01-12 | Discharge: 2019-01-12 | Disposition: A | Discharge status: Home / Self Care | Payer: 59 | Source: Ambulatory Visit | Attending: Obstetrics and Gynecology | Admitting: Obstetrics and Gynecology

## 2019-01-12 ENCOUNTER — Ambulatory Visit: Payer: 59

## 2019-01-12 DIAGNOSIS — R928 Other abnormal and inconclusive findings on diagnostic imaging of breast: Secondary | ICD-10-CM

## 2019-01-12 DIAGNOSIS — N6310 Unspecified lump in the right breast, unspecified quadrant: Secondary | ICD-10-CM

## 2019-01-13 ENCOUNTER — Ambulatory Visit
Admission: RE | Admit: 2019-01-13 | Discharge: 2019-01-13 | Disposition: A | Discharge status: Home / Self Care | Payer: 59 | Source: Ambulatory Visit | Attending: Obstetrics and Gynecology | Admitting: Obstetrics and Gynecology

## 2019-01-13 DIAGNOSIS — N6310 Unspecified lump in the right breast, unspecified quadrant: Secondary | ICD-10-CM

## 2019-06-27 ENCOUNTER — Ambulatory Visit: Payer: Self-pay

## 2019-06-27 NOTE — Telephone Encounter (Signed)
Incoming call with a state ment that she has taken the coivd test.  Waiting on results.  Patient states that she has a terrible headache and fever.   Encouraged Patient to drink plenty of water.  Recom mended Pt.  Take tylenol. Voiced understanding.

## 2019-06-29 ENCOUNTER — Telehealth: Payer: Self-pay

## 2019-06-29 NOTE — Telephone Encounter (Signed)
Copied from [place] [phone]. Topic: General - Other >> Jun 29, 2019  8:04 AM Keene Breath wrote: Reason for CRM: Patient called to ask the nurse to call her regarding her positive COVID test from her employer.  She stated that her throat is very sore and she wanted to know what kind of treatment she should have.  CB# [phone]

## 2019-06-29 NOTE — Telephone Encounter (Signed)
OTC treatment

## 2019-06-29 NOTE — Telephone Encounter (Signed)
Pt aware of response.  

## 2019-06-29 NOTE — Telephone Encounter (Signed)
Anything otc - lozenges, tylenol, advil, chloraseptic spray

## 2019-07-04 ENCOUNTER — Ambulatory Visit (INDEPENDENT_AMBULATORY_CARE_PROVIDER_SITE_OTHER): Payer: 59 | Admitting: Internal Medicine

## 2019-07-04 ENCOUNTER — Encounter: Payer: Self-pay | Admitting: Internal Medicine

## 2019-07-04 ENCOUNTER — Telehealth: Payer: Self-pay

## 2019-07-04 DIAGNOSIS — U071 COVID-19: Secondary | ICD-10-CM | POA: Diagnosis not present

## 2019-07-04 NOTE — Telephone Encounter (Signed)
Appointment made to get paperwork filled out.

## 2019-07-04 NOTE — Telephone Encounter (Signed)
Copied from [place] [phone]. Topic: General - Other >> Jul 01, 2019  3:06 PM Keene Breath wrote: Reason for CRM: Patient would like the nurse or doctor to send some information to her employer regarding her COVID test.  CB# [phone]

## 2019-07-04 NOTE — Telephone Encounter (Signed)
Pt had a virtual visit with Dr. Quay Burow in regards.

## 2019-07-04 NOTE — Telephone Encounter (Signed)
Pt is requesting a callback from [place] regarding her positive covid results. States she needs a letter from Dr. Quay Burow for work but also has some other questions. Please follow up with pt. She is very anxious.

## 2019-07-04 NOTE — Assessment & Plan Note (Signed)
Diagnosed 12/7 through work Most symptoms have improved Still experiencing decreased taste and smell and significant fatigue Her fatigue will limit her return to work and her ability to work without restrictions, therefore we will tentatively plan for her to return to work 12/18.  Advised to start with 4 hours only.  Follow-up approximately 2 weeks later and will increase work hours as tolerated, but given her significant fatigue and decreased stamina, which may take a while to improve we will need to go slow and resuming to her normal schedule Continue increased rest and fluids Follow-up planned for 07/25/2019

## 2019-07-04 NOTE — Progress Notes (Signed)
Virtual Visit via Video Note  I connected with Melissa Kirby on 07/04/19 at  2:15 PM EST by a video enabled telemedicine application and verified that I am speaking with the correct person using two identifiers.   I discussed the limitations of evaluation and management by telemedicine and the availability of in person appointments. The patient expressed understanding and agreed to proceed.  Present for the visit:  Myself, Dr Cheryll Cockayne, Suezanne Jacquet.  The patient is currently at home and I am in the office.    No referring provider.    History of Present Illness: This is an acute visit for recent diagnosis of COVID and paperwork for work.   She was diagnosed with Covid 06/27/2019.  She had the test done at work.  Her initial symptoms were fever, fatigue, sore throat, cough, some shortness of breath and most of her symptoms have improved.  She is no longer having any fevers.  Her cough and shortness of breath have resolved.  She is still extremely fatigued.  All she is done over the past 3 days and sleep.  She also has residual decreased taste and smell.  She does work 12-hour shifts.  Work wants to accommodate her so that she can return to work and is willing to let her return with restrictions.  Review of Systems  Constitutional: Positive for malaise/fatigue. Negative for fever.  HENT: Negative for sore throat.        Decrease smell and taste  Respiratory: Negative for cough, shortness of breath and wheezing.   Cardiovascular: Negative for chest pain.  Musculoskeletal: Negative for myalgias.  Neurological: Negative for headaches.     Social History   Socioeconomic History  . Marital status: Divorced    Spouse name: Not on file  . Number of children: 3  . Years of education: Not on file  . Highest education level: Not on file  Occupational History  . Not on file  Tobacco Use  . Smoking status: Never Smoker  . Smokeless tobacco: Never Used  Substance and Sexual Activity  .  Alcohol use: No    Alcohol/week: 0.0 standard drinks  . Drug use: No  . Sexual activity: Yes    Birth control/protection: Surgical    Comment: Married lives with spouse, 3 children. 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 Not on file  Social History Narrative   Ongoing divorce from husband, separated since 05/2014. Lives alone     Works at [employer] and [employer].  Education: high school 12th grade.      Exercises regularly   Social Determinants of Health   Financial Resource Strain:   . Difficulty of Paying Living Expenses: Not on file  Food Insecurity:   . Worried About Programme researcher, broadcasting/film/video in the Last Year: Not on file  . Ran Out of Food in the Last Year: Not on file  Transportation Needs:   . Lack of Transportation (Medical): Not on file  . Lack of Transportation (Non-Medical): Not on file  Physical Activity:   . Days of Exercise per Week: Not on file  . Minutes of Exercise per Session: Not on file  Stress:   . Feeling of Stress : Not on file  Social Connections:   . Frequency of Communication with Friends and Family: Not on file  . Frequency of Social Gatherings with Friends and Family: Not on file  . Attends Religious Services: Not on file  . Active  Member of Clubs or Organizations: Not on file  . Attends Archivist Meetings: Not on file  . Marital Status: Not on file     Observations/Objective: Appears well in NAD, appears fatigued Breathing normally Skin appears warm and dry   Assessment and Plan:  See Problem List for Assessment and Plan of chronic medical problems.   Follow Up Instructions:    I discussed the assessment and treatment plan with the patient. The patient was provided an opportunity to ask questions and all were answered. The patient agreed with the plan and demonstrated an understanding of the instructions.   The patient was advised to call back or seek an in-person evaluation if the symptoms worsen or  if the condition fails to improve as anticipated.    Binnie Rail, MD

## 2019-07-06 ENCOUNTER — Telehealth: Payer: Self-pay

## 2019-07-06 ENCOUNTER — Encounter: Payer: Self-pay | Admitting: Internal Medicine

## 2019-07-06 NOTE — Telephone Encounter (Signed)
Pt requesting CB from [facility] regarding letter for [employer]. Please advise.

## 2019-07-06 NOTE — Telephone Encounter (Signed)
Spoke with pt in regards. Needed a note sent to [facility] stating what was discussed at appointment. Letter written and faxed. Pt is aware.

## 2019-07-06 NOTE — Telephone Encounter (Signed)
New note faxed

## 2019-07-06 NOTE — Telephone Encounter (Signed)
Copied from [place] [phone]. Topic: General - Other >> Jul 06, 2019  3:26 PM Rainey Pines A wrote: [facility] is requesting telemed/office notes for appointment on 06/29/2019 be faxed to [phone]. Best contact number [phone]

## 2019-07-22 NOTE — Progress Notes (Signed)
Virtual Visit via Video Note  I connected with Melissa Kirby on 07/25/19 at  3:15 PM EST by a video enabled telemedicine application and verified that I am speaking with the correct person using two identifiers.   I discussed the limitations of evaluation and management by telemedicine and the availability of in person appointments. The patient expressed understanding and agreed to proceed.  Present for the visit:  Myself, Dr Cheryll Cockayne, Suezanne Jacquet.  The patient is currently at home and I am in the office.    No referring provider.    History of Present Illness: She is here for follow up of COVID.  Diagnosed 06/27/19.  Last virtual visit 12/14 and she had residual fatigue and decreased smell and taste.  She returned to work 12/18  - working 4 hr shifts only ( not usual 12 hr shifts).  She has a little bit of residual fatigue, but states that she is doing much better.  2 weeks ago we started her at 4-hour shifts only, but at this time she feels she is able to go back to her usual 12-hour shifts without restrictions.  She typically only works 2 days in a row and is able to rest between.  She is not experiencing any decreased taste, fevers, cough, sore throat, cold symptoms, shortness of breath or headaches.  She does need a note to work to release her without restrictions.  She has no other concerns.        Social History   Socioeconomic History  . Marital status: Divorced    Spouse name: Not on file  . Number of children: 3  . Years of education: Not on file  . Highest education level: Not on file  Occupational History  . Not on file  Tobacco Use  . Smoking status: Never Smoker  . Smokeless tobacco: Never Used  Substance and Sexual Activity  . Alcohol use: No    Alcohol/week: 0.0 standard drinks  . Drug use: No  . Sexual activity: Yes    Birth control/protection: Surgical    Comment: Married lives with spouse, 3 children. 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 Not on file  Social History Narrative   Ongoing divorce from husband, separated since 05/2014. Lives alone     Works at [employer] and [employer].  Education: high school 12th grade.      Exercises regularly   Social Determinants of Health   Financial Resource Strain:   . Difficulty of Paying Living Expenses: Not on file  Food Insecurity:   . Worried About Programme researcher, broadcasting/film/video in the Last Year: Not on file  . Ran Out of Food in the Last Year: Not on file  Transportation Needs:   . Lack of Transportation (Medical): Not on file  . Lack of Transportation (Non-Medical): Not on file  Physical Activity:   . Days of Exercise per Week: Not on file  . Minutes of Exercise per Session: Not on file  Stress:   . Feeling of Stress : Not on file  Social Connections:   . Frequency of Communication with Friends and Family: Not on file  . Frequency of Social Gatherings with Friends and Family: Not on file  . Attends Religious Services: Not on file  . Active Member of Clubs or Organizations: Not on file  . Attends Banker Meetings: Not on file  . Marital Status: Not on file     Observations/Objective:  Appears well in NAD Breathing normally Skin appears warm and dry  Assessment and Plan:  See Problem List for Assessment and Plan of chronic medical problems.   Follow Up Instructions:    I discussed the assessment and treatment plan with the patient. The patient was provided an opportunity to ask questions and all were answered. The patient agreed with the plan and demonstrated an understanding of the instructions.   The patient was advised to call back or seek an in-person evaluation if the symptoms worsen or if the condition fails to improve as anticipated.    Binnie Rail, MD

## 2019-07-25 ENCOUNTER — Ambulatory Visit (INDEPENDENT_AMBULATORY_CARE_PROVIDER_SITE_OTHER): Payer: 59 | Admitting: Internal Medicine

## 2019-07-25 ENCOUNTER — Encounter: Payer: Self-pay | Admitting: Internal Medicine

## 2019-07-25 DIAGNOSIS — U071 COVID-19: Secondary | ICD-10-CM

## 2019-07-25 NOTE — Assessment & Plan Note (Signed)
Symptoms improved, specifically fatigue No longer has decreased taste Fatigue is manageable Has been working 4-hour shifts for the past 2 weeks and feels she is able to start back to her regular 12-hour shifts She is able to get enough rest to compensate Note written for work-to return tomorrow to 12-hour shifts without restrictions

## 2019-08-09 ENCOUNTER — Encounter: Payer: Self-pay | Admitting: Internal Medicine

## 2019-08-09 ENCOUNTER — Ambulatory Visit (INDEPENDENT_AMBULATORY_CARE_PROVIDER_SITE_OTHER): Payer: 59 | Admitting: Internal Medicine

## 2019-08-09 DIAGNOSIS — I10 Essential (primary) hypertension: Secondary | ICD-10-CM | POA: Diagnosis not present

## 2019-08-09 MED ORDER — LISINOPRIL 10 MG PO TABS: 10.0000 mg | ORAL_TABLET | Freq: Every day | ORAL | 1 refills | Status: DC

## 2019-08-09 NOTE — Progress Notes (Signed)
Virtual Visit via Video Note  I connected with Melissa Kirby on 08/09/19 at  3:00 PM EST by a video enabled telemedicine application and verified that I am speaking with the correct person using two identifiers.   I discussed the limitations of evaluation and management by telemedicine and the availability of in person appointments. The patient expressed understanding and agreed to proceed.  Present for the visit:  Myself, Dr Billey Gosling, [name].  The patient is currently at home and I am in the office.    No referring provider.    History of Present Illness: This is an acute visit for elevated BP.  She was sent home from work due to her BP being high, 165/105 and she had a headache.     For the past week she could feel her BP in her ear - like a beating.  Last night she did not feel well.  Last night her BP was 145/97.  This morning she took her BP while in bed and it was 130/90.  At work she felt a littlle winded and had palpitations walking into work.  It was 164/105 at work and she went to see the nurse.  It was 160/98 by the nurse.  She took her BP this afternoon and it was in the 140's/90-100's.   Review of Systems  Constitutional: Negative for chills and fever.  HENT:       Can hear heartbeat in left ear  Respiratory: Positive for shortness of breath (today with walking into work).   Cardiovascular: Positive for palpitations (today with walking into work). Negative for chest pain (tight at times) and leg swelling (hands feet tight ).  Neurological: Positive for headaches.     Social History   Socioeconomic History  . Marital status: Divorced    Spouse name: Not on file  . Number of children: 3  . Years of education: Not on file  . Highest education level: Not on file  Occupational History  . Not on file  Tobacco Use  . Smoking status: Never Smoker  . Smokeless tobacco: Never Used  Substance and Sexual Activity  . Alcohol use: No    Alcohol/week: 0.0 standard  drinks  . Drug use: No  . Sexual activity: Yes    Birth control/protection: Surgical    Comment: Married lives with spouse, 3 children. 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 Not on file  Social History Narrative   Ongoing divorce from husband, separated since 05/2014. Lives alone     Works at [employer] and [employer].  Education: high school 12th grade.      Exercises regularly   Social Determinants of Health   Financial Resource Strain:   . Difficulty of Paying Living Expenses: Not on file  Food Insecurity:   . Worried About Charity fundraiser in the Last Year: Not on file  . Ran Out of Food in the Last Year: Not on file  Transportation Needs:   . Lack of Transportation (Medical): Not on file  . Lack of Transportation (Non-Medical): Not on file  Physical Activity:   . Days of Exercise per Week: Not on file  . Minutes of Exercise per Session: Not on file  Stress:   . Feeling of Stress : Not on file  Social Connections:   . Frequency of Communication with Friends and Family: Not on file  . Frequency of Social Gatherings with Friends and Family: Not on  file  . Attends Religious Services: Not on file  . Active Member of Clubs or Organizations: Not on file  . Attends Banker Meetings: Not on file  . Marital Status: Not on file     Observations/Objective: Appears well in NAD Breathing normally Skin appears warm and dry  Assessment and Plan:  See Problem List for Assessment and Plan of chronic medical problems.   Follow Up Instructions:    I discussed the assessment and treatment plan with the patient. The patient was provided an opportunity to ask questions and all were answered. The patient agreed with the plan and demonstrated an understanding of the instructions.   The patient was advised to call back or seek an in-person evaluation if the symptoms worsen or if the condition fails to improve as anticipated.    Pincus Sanes, MD

## 2019-08-09 NOTE — Assessment & Plan Note (Signed)
New BP consistently elevated - having symptoms of elevated BP Start lisinopril 10 mg daily Low sodium diet, regular exercise, keep weight down She will monitor BP at home and update me over the next 1-2 days Can not return to work until BP better controlled - note given for work

## 2019-08-10 ENCOUNTER — Encounter: Payer: Self-pay | Admitting: Internal Medicine

## 2019-08-11 ENCOUNTER — Encounter: Payer: Self-pay | Admitting: Internal Medicine

## 2019-08-11 DIAGNOSIS — R0602 Shortness of breath: Secondary | ICD-10-CM

## 2019-08-11 DIAGNOSIS — R0789 Other chest pain: Secondary | ICD-10-CM

## 2019-08-11 MED ORDER — LISINOPRIL 10 MG PO TABS: 5.0000 mg | ORAL_TABLET | Freq: Every day | ORAL | 1 refills | Status: DC

## 2019-08-15 ENCOUNTER — Ambulatory Visit: Payer: 59 | Admitting: Cardiology

## 2019-08-15 ENCOUNTER — Encounter: Payer: Self-pay | Admitting: Cardiology

## 2019-08-15 ENCOUNTER — Other Ambulatory Visit: Payer: Self-pay

## 2019-08-15 ENCOUNTER — Encounter: Payer: Self-pay | Admitting: *Deleted

## 2019-08-15 VITALS — BP 128/87 | HR 103 | Ht 62.5 in | Wt 142.0 lb

## 2019-08-15 DIAGNOSIS — R0789 Other chest pain: Secondary | ICD-10-CM | POA: Diagnosis not present

## 2019-08-15 DIAGNOSIS — R002 Palpitations: Secondary | ICD-10-CM | POA: Diagnosis not present

## 2019-08-15 DIAGNOSIS — R030 Elevated blood-pressure reading, without diagnosis of hypertension: Secondary | ICD-10-CM | POA: Diagnosis not present

## 2019-08-15 NOTE — Patient Instructions (Addendum)
Medication Instructions:   Hold Lisinopril for now, till further notice.   Continue all other current medications.  Labwork: none  Testing/Procedures:  Your physician has requested that you have an echocardiogram. Echocardiography is a painless test that uses sound waves to create images of your heart. It provides your doctor with information about the size and shape of your heart and how well your heart's chambers and valves are working. This procedure takes approximately one hour. There are no restrictions for this procedure. Your physician has recommended that you wear a 7 day event monitor. Event monitors are medical devices that record the heart's electrical activity. Doctors most often [place] these monitors to diagnose arrhythmias. Arrhythmias are problems with the speed or rhythm of the heartbeat. The monitor is a small, portable device. You can wear one while you do your normal daily activities. This is usually used to diagnose what is causing palpitations/syncope (passing out).  Office will contact with results via phone or letter.    Follow-Up: 6 weeks   Any Other Special Instructions Will Be Listed Below (If Applicable). Work note provided today.  If you need a refill on your cardiac medications before your next appointment, please call your pharmacy.

## 2019-08-15 NOTE — Progress Notes (Signed)
Clinical Summary Melissa Kirby is a 59 y.o.female seen today as a new consult, referred by Dr Quay Burow for the following medical problems.    1. Palpitations - ongoing symptoms since COVID diagnosis - constant feeling of heart racing, some SOB.  - + SOB with episodes.   - occasional sodas, no coffee, rare tea, no energy, no EtOH  2. Elevated  - recent high bp's, one day in particular at work SBPs in the 160s - was started on lisinopril 5mg , only took one day. Had some low bp's after taking - home bp's off meds have been reasonable   3. Chest pain - squeezing like feeling around chest  - can occur at rest or with exertion - Not positional - lasts a few minutes. Occurs daily.   - prior to covid 3 miles on bike and 3 miles on treadmill -stress test 2007/2008 - mother 71 yo when she had MI      4. COVID  - diagnosed Dec 8 - main symptoms were sore throat, cough    SH: works at [employer]  Past Medical History:  Diagnosis Date  . ANEMIA, IRON DEFICIENCY   . Cerebrospinal fluid leak from spinal puncture    improved with 7th patch at [facility] 05/2014  . Depression    therapy  . Fibromyalgia 1999   no meds  . GASTRITIS 11/12/2009   Qualifier: Diagnosis of  By: Dicie Beam RN, Adela Lank    . GERD (gastroesophageal reflux disease)    per endo. - does not take any meds.    . Kidney stones 1998, 2005   x 2  . MIGRAINE HEADACHE    last Migraine 06/29/2011-   . Unspecified hypothyroidism 1982/2000   surgical resection of benign growth - partial then complete  . VITAMIN B12 DEFICIENCY 09/2009 dx     Allergies  Allergen Reactions  . Compazine Anaphylaxis    Hypotension  . Metoclopramide Hcl Anaphylaxis    Hypotension  . Prochlorperazine Edisylate     Other reaction(s): Other (See Comments) Hemodynamic instability  . Gluten Meal Other (See Comments)    Vitamin B irregulaties  . Prozac [Fluoxetine Hcl]     Increased anxiety  . Tramadol Nausea Only  . Morphine And  Related Nausea Only     Current Outpatient Medications  Medication Sig Dispense Refill  . levothyroxine (SYNTHROID, LEVOTHROID) 125 MCG tablet Take 1 tablet (125 mcg total) by mouth daily. 90 tablet 3  . lisinopril (ZESTRIL) 10 MG tablet Take 0.5 tablets (5 mg total) by mouth daily. 45 tablet 1  . nortriptyline (PAMELOR) 50 MG capsule TAKE 2 CAPSULES (100 MG TOTAL) BY MOUTH AT BEDTIME. 180 capsule 3   No current facility-administered medications for this visit.     Past Surgical History:  Procedure Laterality Date  . CHOLECYSTECTOMY  2009  . DIAGNOSTIC LAPAROSCOPY  1982   diagnostic  . POSTERIOR FUSION LUMBAR SPINE  06/2011  . THYROID SURGERY  1983   partial   . TOTAL THYROIDECTOMY  2000   Had thyroid nodule, nonmalignant ([id number]) resection  . TUBAL LIGATION  1989     Allergies  Allergen Reactions  . Compazine Anaphylaxis    Hypotension  . Metoclopramide Hcl Anaphylaxis    Hypotension  . Prochlorperazine Edisylate     Other reaction(s): Other (See Comments) Hemodynamic instability  . Gluten Meal Other (See Comments)    Vitamin B irregulaties  . Prozac [Fluoxetine Hcl]     Increased anxiety  . Tramadol  Nausea Only  . Morphine And Related Nausea Only      Family History  Problem Relation Age of Onset  . Hyperlipidemia Mother   . Heart disease Mother 94       massive MI  . Hyperlipidemia Father   . COPD Father        severe, smoker  . Breast cancer Maternal Grandmother   . Hyperlipidemia Maternal Grandmother   . Coronary artery disease Maternal Grandmother 78       CABG  . Ovarian cancer Other   . Anesthesia problems Neg Hx   . Malignant hyperthermia Neg Hx   . Pseudochol deficiency Neg Hx   . Hypotension Neg Hx      Social History Ms. Neiss reports that she has never smoked. She has never used smokeless tobacco. Ms. Windle reports no history of alcohol use.   Review of Systems CONSTITUTIONAL: No weight loss, fever, chills, weakness or  fatigue.  HEENT: Eyes: No visual loss, blurred vision, double vision or yellow sclerae.No hearing loss, sneezing, congestion, runny nose or sore throat.  SKIN: No rash or itching.  CARDIOVASCULAR: per hpi RESPIRATORY: No shortness of breath, cough or sputum.  GASTROINTESTINAL: No anorexia, nausea, vomiting or diarrhea. No abdominal pain or blood.  GENITOURINARY: No burning on urination, no polyuria NEUROLOGICAL: No headache, dizziness, syncope, paralysis, ataxia, numbness or tingling in the extremities. No change in bowel or bladder control.  MUSCULOSKELETAL: No muscle, back pain, joint pain or stiffness.  LYMPHATICS: No enlarged nodes. No history of splenectomy.  PSYCHIATRIC: No history of depression or anxiety.  ENDOCRINOLOGIC: No reports of sweating, cold or heat intolerance. No polyuria or polydipsia.  Marland Kitchen   Physical Examination Today's Vitals   08/15/19 1451 08/15/19 1458  BP: 128/84 128/87  Pulse: 99 (!) 103  SpO2: 100% 99%  Weight: 142 lb (64.4 kg)   Height: 5' 2.5" (1.588 m)    Body mass index is 25.56 kg/m.  Gen: resting comfortably, no acute distress HEENT: no scleral icterus, pupils equal round and reactive, no palptable cervical adenopathy,  CV: Regular, tachy, no m/r/g, no jvd Resp: Clear to auscultation bilaterally GI: abdomen is soft, non-tender, non-distended, normal bowel sounds, no hepatosplenomegaly MSK: extremities are warm, no edema.  Skin: warm, no rash Neuro:  no focal deficits Psych: appropriate affect     Assessment and Plan  1. Palpitations - new symptom since COVID infection last month - EKG today shows mild sinus tach - we will obtain 7 day event monitor to further evaluate  2. Chest pain - atypical symptoms, also started after COVID infection - will obtain echo to evaluate for any cardiac dysfunction, perhaps covid induced - would not plan of stress testing at this time due to atypical symptoms  3. Elevated blood pressure - I think days  when elevated were more of a response to the other systemic symptoms she has been having (chest pain, palpitations) - her home bp's are not consistently elevated, had some low bp's on lisinopril just 5mg  - follow home bp's for now, hold lisinopril. Reevaluate bp's next visit         , M.D.

## 2019-08-17 DIAGNOSIS — R002 Palpitations: Secondary | ICD-10-CM

## 2019-08-25 ENCOUNTER — Other Ambulatory Visit: Payer: 59

## 2019-08-25 ENCOUNTER — Other Ambulatory Visit: Payer: Self-pay

## 2019-08-25 ENCOUNTER — Ambulatory Visit (INDEPENDENT_AMBULATORY_CARE_PROVIDER_SITE_OTHER): Payer: 59

## 2019-08-25 DIAGNOSIS — R0789 Other chest pain: Secondary | ICD-10-CM

## 2019-08-31 ENCOUNTER — Encounter: Payer: Self-pay | Admitting: Internal Medicine

## 2019-08-31 ENCOUNTER — Other Ambulatory Visit (INDEPENDENT_AMBULATORY_CARE_PROVIDER_SITE_OTHER): Payer: 59

## 2019-08-31 ENCOUNTER — Telehealth: Payer: Self-pay | Admitting: *Deleted

## 2019-08-31 ENCOUNTER — Ambulatory Visit (INDEPENDENT_AMBULATORY_CARE_PROVIDER_SITE_OTHER): Payer: 59

## 2019-08-31 ENCOUNTER — Ambulatory Visit (INDEPENDENT_AMBULATORY_CARE_PROVIDER_SITE_OTHER): Payer: 59 | Admitting: Internal Medicine

## 2019-08-31 ENCOUNTER — Other Ambulatory Visit: Payer: Self-pay

## 2019-08-31 ENCOUNTER — Telehealth: Payer: Self-pay

## 2019-08-31 DIAGNOSIS — R0602 Shortness of breath: Secondary | ICD-10-CM

## 2019-08-31 DIAGNOSIS — E538 Deficiency of other specified B group vitamins: Secondary | ICD-10-CM

## 2019-08-31 DIAGNOSIS — E559 Vitamin D deficiency, unspecified: Secondary | ICD-10-CM

## 2019-08-31 DIAGNOSIS — R5383 Other fatigue: Secondary | ICD-10-CM

## 2019-08-31 DIAGNOSIS — E039 Hypothyroidism, unspecified: Secondary | ICD-10-CM

## 2019-08-31 LAB — CBC WITH DIFFERENTIAL/PLATELET
Basophils Absolute: 0 10*3/uL (ref 0.0–0.1)
Basophils Relative: 0.4 % (ref 0.0–3.0)
Eosinophils Absolute: 0.1 10*3/uL (ref 0.0–0.7)
Eosinophils Relative: 1.6 % (ref 0.0–5.0)
HCT: 36.9 % (ref 36.0–46.0)
Hemoglobin: 12.3 g/dL (ref 12.0–15.0)
Lymphocytes Relative: 37.3 % (ref 12.0–46.0)
Lymphs Abs: 3 10*3/uL (ref 0.7–4.0)
MCHC: 33.4 g/dL (ref 30.0–36.0)
MCV: 90.8 fl (ref 78.0–100.0)
Monocytes Absolute: 0.8 10*3/uL (ref 0.1–1.0)
Monocytes Relative: 9.5 % (ref 3.0–12.0)
Neutro Abs: 4.1 10*3/uL (ref 1.4–7.7)
Neutrophils Relative %: 51.2 % (ref 43.0–77.0)
Platelets: 267 10*3/uL (ref 150.0–400.0)
RBC: 4.07 Mil/uL (ref 3.87–5.11)
RDW: 13.3 % (ref 11.5–15.5)
WBC: 8.1 10*3/uL (ref 4.0–10.5)

## 2019-08-31 LAB — COMPREHENSIVE METABOLIC PANEL
ALT: 13 U/L (ref 0–35)
AST: 18 U/L (ref 0–37)
Albumin: 4.1 g/dL (ref 3.5–5.2)
Alkaline Phosphatase: 156 U/L — ABNORMAL HIGH (ref 39–117)
BUN: 9 mg/dL (ref 6–23)
CO2: 26 mEq/L (ref 19–32)
Calcium: 9.2 mg/dL (ref 8.4–10.5)
Chloride: 104 mEq/L (ref 96–112)
Creatinine, Ser: 0.94 mg/dL (ref 0.40–1.20)
GFR: 61.29 mL/min (ref >60.00)
Glucose, Bld: 90 mg/dL (ref 70–99)
Potassium: 3.3 mEq/L — ABNORMAL LOW (ref 3.5–5.1)
Sodium: 137 mEq/L (ref 135–145)
Total Bilirubin: 0.4 mg/dL (ref 0.2–1.2)
Total Protein: 7.9 g/dL (ref 6.0–8.3)

## 2019-08-31 LAB — TSH: TSH: 1.03 u[IU]/mL (ref 0.35–4.50)

## 2019-08-31 LAB — VITAMIN D 25 HYDROXY (VIT D DEFICIENCY, FRACTURES): VITD: 17.28 ng/mL — ABNORMAL LOW (ref 30.00–100.00)

## 2019-08-31 LAB — VITAMIN B12: Vitamin B-12: 264 pg/mL (ref 211–911)

## 2019-08-31 MED ORDER — BUDESONIDE-FORMOTEROL FUMARATE 160-4.5 MCG/ACT IN AERO: 2.0000 | INHALATION_SPRAY | Freq: Two times a day (BID) | RESPIRATORY_TRACT | 3 refills | Status: DC

## 2019-08-31 MED ORDER — MULTIVITAMINS PO CAPS: 1.0000 | ORAL_CAPSULE | Freq: Every day | ORAL | Status: DC

## 2019-08-31 MED ORDER — VITAMIN D (ERGOCALCIFEROL) 1.25 MG (50000 UNIT) PO CAPS: 50000.0000 [IU] | ORAL_CAPSULE | ORAL | 0 refills | Status: DC

## 2019-08-31 MED ORDER — FLUTICASONE-SALMETEROL 250-50 MCG/DOSE IN AEPB: 1.0000 | INHALATION_SPRAY | Freq: Two times a day (BID) | RESPIRATORY_TRACT | 3 refills | Status: DC

## 2019-08-31 NOTE — Assessment & Plan Note (Signed)
Subacute Diagnosed with Covid 06/28/2019 and since then has had fatigue and weakness and some of her fatigue may be residual from her infection with Covid Therapy may also be also we will be checking her TSH We will also check CBC, CMP, B12 and vitamin D levels to rule out causes of fatigue

## 2019-08-31 NOTE — Progress Notes (Signed)
Virtual Visit via Video Note  I connected with Melissa Kirby on 08/31/19 at 10:45 AM EST by a video enabled telemedicine application and verified that I am speaking with the correct person using two identifiers.   I discussed the limitations of evaluation and management by telemedicine and the availability of in person appointments. The patient expressed understanding and agreed to proceed.  Present for the visit:  Myself, Dr Billey Gosling, [name].  The patient is currently at home and I am in the office.    No referring provider.    History of Present Illness: This is an acute visit for persistent SOB and tightness in chest.   She was diagnosed with covid on 06/28/19.    She still feels very weak and tired - that never improved since covid.  She thinks her breathing has gotten a little worse.  She can not take a deep breath and still has tightness in chest.  She feels SOB with exertion. Even at rest she feels like she has labored breathing. Taking a deep breath almost hurts.  She has a little cough first thing in the morning.  She denies wheeze.  She saw cardiology and her tests were normal.   She is eating well and drinking well. Supposed to go back to work on Monday.   Review of Systems  Constitutional: Positive for malaise/fatigue (persistent fatigue and weakness). Negative for chills and fever.  Respiratory: Positive for cough (a little in morning when she gets up) and shortness of breath. Negative for wheezing.        Tightness in chest  Cardiovascular: Negative for chest pain, palpitations and leg swelling.  Skin:       Hair is falling out  Neurological: Positive for dizziness (when she bends down - gets woozy).      Social History   Socioeconomic History  . Marital status: Divorced    Spouse name: Not on file  . Number of children: 3  . Years of education: Not on file  . Highest education level: Not on file  Occupational History  . Not on file  Tobacco Use  .  Smoking status: Never Smoker  . Smokeless tobacco: Never Used  Substance and Sexual Activity  . Alcohol use: No    Alcohol/week: 0.0 standard drinks  . Drug use: No  . Sexual activity: Yes    Birth control/protection: Surgical    Comment: Married lives with spouse, 3 children. 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 Not on file  Social History Narrative   Ongoing divorce from husband, separated since 05/2014. Lives alone     Works at [employer] and [employer].  Education: high school 12th grade.      Exercises regularly   Social Determinants of Health   Financial Resource Strain:   . Difficulty of Paying Living Expenses: Not on file  Food Insecurity:   . Worried About Charity fundraiser in the Last Year: Not on file  . Ran Out of Food in the Last Year: Not on file  Transportation Needs:   . Lack of Transportation (Medical): Not on file  . Lack of Transportation (Non-Medical): Not on file  Physical Activity:   . Days of Exercise per Week: Not on file  . Minutes of Exercise per Session: Not on file  Stress:   . Feeling of Stress : Not on file  Social Connections:   . Frequency of Communication with Friends and  Family: Not on file  . Frequency of Social Gatherings with Friends and Family: Not on file  . Attends Religious Services: Not on file  . Active Member of Clubs or Organizations: Not on file  . Attends Banker Meetings: Not on file  . Marital Status: Not on file     Observations/Objective: Appears well in NAD Breathing normally, speaking in full sentences Skin appears warm and dry  Assessment and Plan:  See Problem List for Assessment and Plan of chronic medical problems.   Follow Up Instructions:    I discussed the assessment and treatment plan with the patient. The patient was provided an opportunity to ask questions and all were answered. The patient agreed with the plan and demonstrated an understanding of the  instructions.   The patient was advised to call back or seek an in-person evaluation if the symptoms worsen or if the condition fails to improve as anticipated.    Pincus Sanes, MD

## 2019-08-31 NOTE — Assessment & Plan Note (Signed)
Chronic She started taking a multivitamin recently She is having increased fatigue We will check vitamin B12 level

## 2019-08-31 NOTE — Assessment & Plan Note (Signed)
Chronic Having increased fatigue-so that is likely related to Covid, but her thyroid may also be off.  In addition to fatigue she is experiencing some hair loss Check TSH we will titrate medication if needed

## 2019-08-31 NOTE — Telephone Encounter (Signed)
Patient calling and states that the budesonide-formoterol (SYMBICORT) 160-4.5 MCG/ACT inhaler Is too expensive. States that it was $75 after insurance. Would like to know if a different inhaler could be sent in?   [id number] - [place], [place] - [address] AT [place] [address]

## 2019-08-31 NOTE — Telephone Encounter (Signed)
LM to return call.

## 2019-08-31 NOTE — Telephone Encounter (Signed)
-----   Message from Antoine Poche, MD sent at 08/29/2019  7:56 AM EST ----- Echo looks good, normal heart function   Dominga Ferry MD

## 2019-08-31 NOTE — Telephone Encounter (Signed)
Pt voiced understanding - routed to pcp  

## 2019-08-31 NOTE — Assessment & Plan Note (Signed)
Chronic New multivitamin, but has not been taking her mom Has had some increasing fatigue We will check vitamin D level

## 2019-08-31 NOTE — Telephone Encounter (Signed)
Alternative sent - I can not tell what is covered -- if it is too expensive have her let us know

## 2019-08-31 NOTE — Assessment & Plan Note (Addendum)
Subacute She is experiencing shortness of breath, tightness in her chest and feeling like she is not getting enough air or able to take deep breaths History of exercise-induced asthma, but is not taking inhaler and is only needed 1 with URIs in the past Start Symbicort 160-4.52 puffs twice daily Advised to rinse her mouth after use Chest x-ray today, labs today Will refer to pulmonary for their input

## 2019-09-01 ENCOUNTER — Encounter: Payer: Self-pay | Admitting: Internal Medicine

## 2019-09-01 NOTE — Telephone Encounter (Signed)
Pt aware.

## 2019-09-02 ENCOUNTER — Encounter: Payer: Self-pay | Admitting: Internal Medicine

## 2019-09-12 ENCOUNTER — Encounter: Payer: Self-pay | Admitting: Internal Medicine

## 2019-09-16 ENCOUNTER — Other Ambulatory Visit: Payer: Self-pay | Admitting: Internal Medicine

## 2019-09-23 ENCOUNTER — Encounter: Payer: Self-pay | Admitting: *Deleted

## 2019-09-23 ENCOUNTER — Telehealth: Payer: Self-pay | Admitting: *Deleted

## 2019-09-23 NOTE — Telephone Encounter (Signed)
-----   Message from Antoine Poche, MD sent at 09/19/2019 10:01 AM EST ----- Normal heart monitor, will discuss in detail at our upcoming f/u   Dominga Ferry MD

## 2019-09-23 NOTE — Telephone Encounter (Signed)
Pt aware - routed to pcp  

## 2019-09-26 ENCOUNTER — Other Ambulatory Visit: Payer: Self-pay | Admitting: Internal Medicine

## 2019-09-28 ENCOUNTER — Encounter: Payer: Self-pay | Admitting: Cardiology

## 2019-09-28 ENCOUNTER — Telehealth (INDEPENDENT_AMBULATORY_CARE_PROVIDER_SITE_OTHER): Payer: 59 | Admitting: Cardiology

## 2019-09-28 VITALS — BP 131/92 | HR 96 | Ht 63.0 in | Wt 142.0 lb

## 2019-09-28 DIAGNOSIS — R0789 Other chest pain: Secondary | ICD-10-CM

## 2019-09-28 DIAGNOSIS — R002 Palpitations: Secondary | ICD-10-CM

## 2019-09-28 NOTE — Patient Instructions (Signed)
Your physician recommends that you schedule a follow-up appointment in: AS NEEDED WITH DR BRANCH  Your physician recommends that you continue on your current medications as directed. Please refer to the Current Medication list given to you today.  Thank you for choosing [facility]!!    

## 2019-09-28 NOTE — Progress Notes (Signed)
Virtual Visit via Telephone Note   This visit type was conducted due to national recommendations for restrictions regarding the COVID-19 Pandemic (e.g. social distancing) in an effort to limit this patient's exposure and mitigate transmission in our community.  Due to her co-morbid illnesses, this patient is at least at moderate risk for complications without adequate follow up.  This format is felt to be most appropriate for this patient at this time.  The patient did not have access to video technology/had technical difficulties with video requiring transitioning to audio format only (telephone).  All issues noted in this document were discussed and addressed.  No physical exam could be performed with this format.  Please refer to the patient's chart for her  consent to telehealth for [facility].   The patient was identified using 2 identifiers.  Date:  09/28/2019   ID:  Melissa Kirby, DOB 1961/07/10, MRN [id number]  Patient Location: Home Provider Location: Office  PCP:  Pincus Sanes, MD  Cardiologist:  Dina Rich, MD  Electrophysiologist:  None   Evaluation Performed:  Follow-Up Visit  Chief Complaint:  Follow up   History of Present Illness:    Melissa Kirby is a 59 y.o. female seen today for follow up of the following medical problems.   1. Palpitations - ongoing symptoms since COVID diagnosis - constant feeling of heart racing, some SOB.  - + SOB with episodes.   - recent benign event monitor and echo - started on inhaler by pcp, with use her SOB and palpitatiosn have imrpoved.     2. Chest pain - squeezing like feeling around chest  - can occur at rest or with exertion - Not positional - lasts a few minutes. Occurs daily.   - prior to covid 3 miles on bike and 3 miles on treadmill -stress test 2007/2008 - mother 65 yo when she had MI   - symptoms improved with inhaler, which has improved her chest pains and breathing.       3. COVID  -  diagnosed Dec 8 - main symptoms were sore throat, cough    SH: works at [employer] The patient does not have symptoms concerning for COVID-19 infection (fever, chills, cough, or new shortness of breath).    Past Medical History:  Diagnosis Date  . ANEMIA, IRON DEFICIENCY   . Cerebrospinal fluid leak from spinal puncture    improved with 7th patch at [facility] 05/2014  . Depression    therapy  . Fibromyalgia 1999   no meds  . GASTRITIS 11/12/2009   Qualifier: Diagnosis of  By: Theadora Rama RN, Silvio Pate    . GERD (gastroesophageal reflux disease)    per endo. - does not take any meds.    . Kidney stones 1998, 2005   x 2  . MIGRAINE HEADACHE    last Migraine 06/29/2011-   . Unspecified hypothyroidism 1982/2000   surgical resection of benign growth - partial then complete  . VITAMIN B12 DEFICIENCY 09/2009 dx   Past Surgical History:  Procedure Laterality Date  . CHOLECYSTECTOMY  2009  . DIAGNOSTIC LAPAROSCOPY  1982   diagnostic  . POSTERIOR FUSION LUMBAR SPINE  06/2011  . THYROID SURGERY  1983   partial   . TOTAL THYROIDECTOMY  2000   Had thyroid nodule, nonmalignant ([id number]) resection  . TUBAL LIGATION  1989     No outpatient medications have been marked as taking for the 09/28/19 encounter (Appointment) with Antoine Poche, MD.  Allergies:   Compazine, Metoclopramide hcl, Prochlorperazine edisylate, Gluten meal, Prozac [fluoxetine hcl], Tramadol, and Morphine and related   Social History   Tobacco Use  . Smoking status: Never Smoker  . Smokeless tobacco: Never Used  Substance Use Topics  . Alcohol use: No    Alcohol/week: 0.0 standard drinks  . Drug use: No     Family Hx: The patient's family history includes Breast cancer in her maternal grandmother; COPD in her father; Coronary artery disease (age of onset: 65) in her maternal grandmother; Heart disease (age of onset: 73) in her mother; Hyperlipidemia in her father, maternal grandmother, and mother; Ovarian  cancer in an other family member. There is no history of Anesthesia problems, Malignant hyperthermia, Pseudochol deficiency, or Hypotension.  ROS:   Please see the history of present illness.     All other systems reviewed and are negative.   Prior CV studies:   The following studies were reviewed today:  Jan 2021 monitor  7 day event monitor  Min HR 73, Max HR 142, Avg HR 98  No symptoms reported  Telemetry tracings show sinus rhythm and sinus tachycardia, isolated PVC  No significant arrhythmias   08/2019 echo: LVEF 60-65%, indet DDX, mild MR,   Labs/Other Tests and Data Reviewed:    EKG:  No ECG reviewed.  Recent Labs: 08/31/2019: ALT 13; BUN 9; Creatinine, Ser 0.94; Hemoglobin 12.3; Platelets 267.0; Potassium 3.3; Sodium 137; TSH 1.03   Recent Lipid Panel Lab Results  Component Value Date/Time   CHOL 207 (H) 09/20/2018 04:02 PM   TRIG 121.0 09/20/2018 04:02 PM   HDL 53.90 09/20/2018 04:02 PM   CHOLHDL 4 09/20/2018 04:02 PM   LDLCALC 129 (H) 09/20/2018 04:02 PM   LDLDIRECT 145.1 06/29/2013 10:51 AM    Wt Readings from Last 3 Encounters:  08/15/19 142 lb (64.4 kg)  09/20/18 150 lb 1.9 oz (68.1 kg)  06/10/18 150 lb 12.8 oz (68.4 kg)     Objective:    Vital Signs:   Today's Vitals   09/28/19 1037  BP: (!) 131/92  Pulse: 96  Weight: 142 lb (64.4 kg)  Height: 5\' 3"  (1.6 m)   Body mass index is 25.15 kg/m.  Normal affect. Normal speech pattern and tone. Comfortable, no apparent distress. No audible signs of SOB or wheezing.   ASSESSMENT & PLAN:    1. Palpitations - new symptom since COVID infection last month -recent benign event monitors. Symptoms improving with home inhaler - no further cardiac workup. Could consider trial of low dose diltiazem if significant recurrence.   2. Chest pain - atypical symptoms, also started after COVID infection - improved with use of inhaler. Likely some lingering hyperactive airways from recent covid infection -  normal echo - no plans for any further cardiac workup    COVID-19 Education: The signs and symptoms of COVID-19 were discussed with the patient and how to seek care for testing (follow up with PCP or arrange E-visit).  The importance of social distancing was discussed today.  Time:   Today, I have spent 15 minutes with the patient with telehealth technology discussing the above problems.     Medication Adjustments/Labs and Tests Ordered: Current medicines are reviewed at length with the patient today.  Concerns regarding medicines are outlined above.   Tests Ordered: No orders of the defined types were placed in this encounter.   Medication Changes: No orders of the defined types were placed in this encounter.   Follow Up:  F/u as needed  Signed, Carlyle Dolly, MD  09/28/2019 9:44 AM    [facility]

## 2019-10-04 NOTE — Progress Notes (Signed)
Subjective:    Patient ID: Melissa Kirby, female    DOB: 02/23/1961, 59 y.o.   MRN: [id number]  HPI The patient is here for follow up asthma, SOB since covid.  She was diagnosed with Covid 06/27/2019.  She still has no energy and feels exhausted all the time.   She still has chest heaviness that goes through to her back.  She has SOB with rest and exertion.  The advair helps a little.  She gets exhausted with showering.  She denies any wheezing.  She has occasional dry cough.  She does feel like she has some hoarseness or raspiness.   She has IBS-like symptoms, which is new since Covid- she has had this rarely in the past.  If she eats she will sometimes have to go to the bathroom right away and she has pure water.  It happened twice in one week.  There is no obvious food that causes it.  One time she does not able to make it to the bathroom.  Her blood pressure has been on the low side at home and we stopped lisinopril.  Medications and allergies reviewed with patient and updated if appropriate.  Patient Active Problem List   Diagnosis Date Noted  . Hypertension 08/09/2019  . COVID-19 07/04/2019  . History of CMV 09/20/2018  . Lumbar radiculopathy 05/20/2018  . Lumbar back pain with radiculopathy affecting right lower extremity 03/03/2018  . Insomnia 12/25/2017  . Osteopenia 08/19/2017  . Gluten intolerance 08/12/2017  . Exercise-induced asthma 08/12/2017  . SOB (shortness of breath) 09/02/2016  . Intracranial hypotension 01/24/2014  . Vitamin D deficiency   . Abnormal brain MRI 10/15/2013  . Elevated blood pressure reading 10/13/2013  . LBP (low back pain) 05/10/2012  . Fibromyalgia   . Fatigue 01/17/2011  . Atrophic vaginitis 10/17/2010  . Vitamin B12 deficiency (non anemic) 10/04/2009  . Hypothyroidism 10/03/2009  . Migraine headache 10/03/2009    Current Outpatient Medications on File Prior to Visit  Medication Sig Dispense Refill  . cyanocobalamin 2000 MCG  tablet Take 2,000 mcg by mouth daily.    . Fluticasone-Salmeterol (ADVAIR DISKUS) 250-50 MCG/DOSE AEPB Inhale 1 puff into the lungs 2 (two) times daily. 1 each 3  . levothyroxine (SYNTHROID) 125 MCG tablet TAKE 1 TABLET BY MOUTH EVERY DAY 90 tablet 3  . Multiple Vitamin (MULTIVITAMIN) capsule Take 1 capsule by mouth daily.    . nortriptyline (PAMELOR) 50 MG capsule TAKE 2 CAPSULES (100 MG TOTAL) BY MOUTH AT BEDTIME. 180 capsule 1  . Vitamin D, Ergocalciferol, (DRISDOL) 1.25 MG (50000 UNIT) CAPS capsule Take 1 capsule (50,000 Units total) by mouth every 7 (seven) days. 8 capsule 0   No current facility-administered medications on file prior to visit.    Past Medical History:  Diagnosis Date  . ANEMIA, IRON DEFICIENCY   . Cerebrospinal fluid leak from spinal puncture    improved with 7th patch at [facility] 05/2014  . Depression    therapy  . Fibromyalgia 1999   no meds  . GASTRITIS 11/12/2009   Qualifier: Diagnosis of  By: Dicie Beam RN, Adela Lank    . GERD (gastroesophageal reflux disease)    per endo. - does not take any meds.    . Kidney stones 1998, 2005   x 2  . MIGRAINE HEADACHE    last Migraine 06/29/2011-   . Unspecified hypothyroidism 1982/2000   surgical resection of benign growth - partial then complete  . VITAMIN B12 DEFICIENCY 09/2009 dx  Past Surgical History:  Procedure Laterality Date  . CHOLECYSTECTOMY  2009  . DIAGNOSTIC LAPAROSCOPY  1982   diagnostic  . POSTERIOR FUSION LUMBAR SPINE  06/2011  . THYROID SURGERY  1983   partial   . TOTAL THYROIDECTOMY  2000   Had thyroid nodule, nonmalignant ([id number]) resection  . TUBAL LIGATION  1989    Social History   Socioeconomic History  . Marital status: Divorced    Spouse name: Not on file  . Number of children: 3  . Years of education: Not on file  . Highest education level: Not on file  Occupational History  . Not on file  Tobacco Use  . Smoking status: Never Smoker  . Smokeless tobacco: Never Used  Substance  and Sexual Activity  . Alcohol use: No    Alcohol/week: 0.0 standard drinks  . Drug use: No  . Sexual activity: Yes    Birth control/protection: Surgical    Comment: Married lives with spouse, 3 children. 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 Not on file  Social History Narrative   Ongoing divorce from husband, separated since 05/2014. Lives alone     Works at [employer] and [employer].  Education: high school 12th grade.      Exercises regularly   Social Determinants of Health   Financial Resource Strain:   . Difficulty of Paying Living Expenses:   Food Insecurity:   . Worried About Programme researcher, broadcasting/film/video in the Last Year:   . Barista in the Last Year:   Transportation Needs:   . Freight forwarder (Medical):   Marland Kitchen Lack of Transportation (Non-Medical):   Physical Activity:   . Days of Exercise per Week:   . Minutes of Exercise per Session:   Stress:   . Feeling of Stress :   Social Connections:   . Frequency of Communication with Friends and Family:   . Frequency of Social Gatherings with Friends and Family:   . Attends Religious Services:   . Active Member of Clubs or Organizations:   . Attends Banker Meetings:   Marland Kitchen Marital Status:     Family History  Problem Relation Age of Onset  . Hyperlipidemia Mother   . Heart disease Mother 71       massive MI  . Hyperlipidemia Father   . COPD Father        severe, smoker  . Breast cancer Maternal Grandmother   . Hyperlipidemia Maternal Grandmother   . Coronary artery disease Maternal Grandmother 78       CABG  . Ovarian cancer Other   . Anesthesia problems Neg Hx   . Malignant hyperthermia Neg Hx   . Pseudochol deficiency Neg Hx   . Hypotension Neg Hx     Review of Systems  Constitutional: Positive for fatigue. Negative for chills and fever.  HENT: Positive for voice change. Negative for congestion, postnasal drip, sore throat and trouble swallowing.   Respiratory:  Positive for cough (rare, dry), chest tightness (chest heaviness - can't take a deep breath - feels like she cant get a deep breath) and shortness of breath. Negative for wheezing.   Gastrointestinal: Positive for diarrhea (intermittent - after eating - IBS).       No gerd  Neurological: Positive for headaches (occ after coughing, sneezing  at same time, or with bending).       Objective:   Vitals:   10/05/19 0841  BP: 128/84  Pulse: 92  Resp: 18  Temp: 98.6 F (37 C)  SpO2: 99%   BP Readings from Last 3 Encounters:  10/05/19 128/84  09/28/19 (!) 131/92  08/15/19 128/87   Wt Readings from Last 3 Encounters:  10/05/19 141 lb 6.4 oz (64.1 kg)  09/28/19 142 lb (64.4 kg)  08/15/19 142 lb (64.4 kg)   Body mass index is 25.05 kg/m.   Physical Exam    Constitutional: Appears well-developed and well-nourished. No distress.  HENT:  Head: Normocephalic and atraumatic.  Neck: Neck supple. No tracheal deviation present. No thyromegaly present.  No cervical lymphadenopathy Cardiovascular: Normal rate, regular rhythm and normal heart sounds.   No murmur heard. No carotid bruit .  No edema Pulmonary/Chest: Effort normal and breath sounds normal. No respiratory distress. No has no wheezes. No rales.  Skin: Skin is warm and dry. Not diaphoretic.  Psychiatric: Normal mood and affect. Behavior is normal.      Assessment & Plan:    See Problem List for Assessment and Plan of chronic medical problems.    This visit occurred during the SARS-CoV-2 public health emergency.  Safety protocols were in place, including screening questions prior to the visit, additional usage of staff PPE, and extensive cleaning of exam room while observing appropriate contact time as indicated for disinfecting solutions.

## 2019-10-05 ENCOUNTER — Ambulatory Visit: Payer: 59 | Admitting: Internal Medicine

## 2019-10-05 ENCOUNTER — Other Ambulatory Visit: Payer: Self-pay

## 2019-10-05 ENCOUNTER — Encounter: Payer: Self-pay | Admitting: Internal Medicine

## 2019-10-05 DIAGNOSIS — R0602 Shortness of breath: Secondary | ICD-10-CM

## 2019-10-05 DIAGNOSIS — Z8709 Personal history of other diseases of the respiratory system: Secondary | ICD-10-CM

## 2019-10-05 DIAGNOSIS — R519 Headache, unspecified: Secondary | ICD-10-CM

## 2019-10-05 DIAGNOSIS — Z8616 Personal history of COVID-19: Secondary | ICD-10-CM

## 2019-10-05 DIAGNOSIS — R197 Diarrhea, unspecified: Secondary | ICD-10-CM

## 2019-10-05 DIAGNOSIS — I10 Essential (primary) hypertension: Secondary | ICD-10-CM | POA: Diagnosis not present

## 2019-10-05 DIAGNOSIS — R5383 Other fatigue: Secondary | ICD-10-CM

## 2019-10-05 DIAGNOSIS — R0789 Other chest pain: Secondary | ICD-10-CM

## 2019-10-05 DIAGNOSIS — R05 Cough: Secondary | ICD-10-CM

## 2019-10-05 MED ORDER — ALBUTEROL SULFATE HFA 108 (90 BASE) MCG/ACT IN AERS: 2.0000 | INHALATION_SPRAY | Freq: Four times a day (QID) | RESPIRATORY_TRACT | 5 refills | Status: AC | PRN

## 2019-10-05 NOTE — Assessment & Plan Note (Signed)
We stopped lisinopril because her blood pressure was on the low side Blood pressure looks good here today without medication Continue to monitor without meds

## 2019-10-05 NOTE — Assessment & Plan Note (Signed)
Since Covid she is experiencing shortness of breath with exertion and at rest and a heaviness in her chest like she cannot take a deep breath She does have a history of exercise-induced asthma but was not requiring an inhaler Advair twice daily is helping, but not resolving the symptoms Chest x-ray last month unremarkable She has not heard from pulmonary yet-given number and she will call to schedule I will give her samples of a couple of other inhalers to try to see if this helps-Trelegy and breztri    Albuterol inhaler given to use as needed We will hold off on steroids to see what pulmonary thinks

## 2019-10-05 NOTE — Assessment & Plan Note (Signed)
Experiencing significant fatigue since Covid-there has been no improvement Blood work was done last month-vitamin D and B12 levels are low and she is taking supplementation We will recheck those levels in a couple of months

## 2019-10-05 NOTE — Patient Instructions (Addendum)
Call pulmonary to schedule an appointment - [phone].   Try the inhalers given  - these are maintenance inhalers.   Use albuterol as needed   trelegy 1 puff daily   breztri   2 puff BID

## 2019-10-11 ENCOUNTER — Ambulatory Visit: Payer: 59 | Admitting: Internal Medicine

## 2019-10-11 ENCOUNTER — Encounter: Payer: Self-pay | Admitting: Internal Medicine

## 2019-10-11 ENCOUNTER — Other Ambulatory Visit: Payer: Self-pay

## 2019-10-11 VITALS — BP 128/88 | HR 101 | Temp 98.3°F | Ht 63.0 in | Wt 141.0 lb

## 2019-10-11 DIAGNOSIS — J310 Chronic rhinitis: Secondary | ICD-10-CM

## 2019-10-11 DIAGNOSIS — M940 Chondrocostal junction syndrome [Tietze]: Secondary | ICD-10-CM

## 2019-10-11 DIAGNOSIS — U071 COVID-19: Secondary | ICD-10-CM

## 2019-10-11 DIAGNOSIS — J4520 Mild intermittent asthma, uncomplicated: Secondary | ICD-10-CM | POA: Diagnosis not present

## 2019-10-11 MED ORDER — PANTOPRAZOLE SODIUM 40 MG PO TBEC: 40.0000 mg | DELAYED_RELEASE_TABLET | Freq: Every day | ORAL | 3 refills | Status: DC

## 2019-10-11 MED ORDER — FLUTICASONE PROPIONATE 50 MCG/ACT NA SUSP: 1.0000 | Freq: Every day | NASAL | 2 refills | Status: DC

## 2019-10-11 NOTE — Patient Instructions (Addendum)
The patient should have follow up scheduled with myself in 10-12 weeks.   Prior to next visit patient should have: Spirometry/Feno  Ibuprofen take 600 mg no more than 3 times/day as needed for pain. Take the Pantoprazole while you are on this, once a day.   Flonase - 1 spray on each side of your nose twice a day for first week, then 1 spray on each side.   Instructions for use:  If you also use a saline nasal spray or rinse, use that first.  Position the head with the chin slightly tucked. Use the right hand to spray into the left nostril and the right hand to spray into the left nostril.   Point the bottle away from the septum of your nose (cartilage that divides the two sides of your nose).   Hold the nostril closed on the opposite side from where you will spray  Spray once and gently sniff to pull the medicine into the higher parts of your nose.  Don't sniff too hard as the medicine will drain down the back of your throat instead.  Repeat with a second spray on the same side if prescribed.  Repeat on the other side of your nose.  Take the albuterol rescue inhaler every 4 to 6 hours as needed for wheezing or shortness of breath. You can also take it 15 minutes before exercise or exertional activity. Side effects include heart racing or pounding, jitters or anxiety. If you have a history of an irregular heart rhythm, it can make this worse. Can also give some patients a hard time sleeping.  To inhale the aerosol using an inhaler, follow these steps:  1. Remove the protective dust cap from the end of the mouthpiece. If the dust cap was not placed on the mouthpiece, check the mouthpiece for dirt or other objects. Be sure that the canister is fully and firmly inserted in the mouthpiece. 2. If you are using the inhaler for the first time or if you have not used the inhaler in more than 14 days, you will need to prime it. You may also need to prime the inhaler if it has been dropped. Ask  your pharmacist or check the manufacturer's information if this happens. To prime the inhaler, shake it well and then press down on the canister 4 times to release 4 sprays into the air, away from your face. Be careful not to get albuterol in your eyes. 3. Shake the inhaler well. 4. Breathe out as completely as possible through your mouth. 4. Hold the canister with the mouthpiece on the bottom, facing you and the canister pointing upward. Place the open end of the mouthpiece into your mouth. Close your lips tightly around the mouthpiece. 6. Breathe in slowly and deeply through the mouthpiece.At the same time, press down once on the container to spray the medication into your mouth. 7. Try to hold your breath for 10 seconds. remove the inhaler, and breathe out slowly. 8. If you were told to use 2 puffs, wait 1 minute and then repeat steps 3-7. 9. Replace the protective cap on the inhaler. 10. Clean your inhaler regularly. Follow the manufacturer's directions carefully and ask your doctor or pharmacist if you have any questions about cleaning your inhaler.  Check the back of the inhaler to keep track of the total number of doses left on the inhaler.     By learning about asthma and how it can be controlled, you take an important step  toward managing this disease. Work closely with your asthma care team to learn all you can about your asthma, how to avoid triggers, what your medications do, and how to take them correctly. With proper care, you can live free of asthma symptoms and maintain a normal, healthy lifestyle.  What is asthma? Asthma is a chronic disease that affects the airways of the lungs. During normal breathing, the bands of muscle that surround the airways are relaxed and air moves freely. During an asthma episode or "attack," there are three main changes that stop air from moving easily through the airways:  The bands of muscle that surround the airways tighten and make the airways  narrow. This tightening is called bronchospasm.   The lining of the airways becomes swollen or inflamed.   The cells that line the airways produce more mucus, which is thicker than normal and clogs the airways.  These three factors - bronchospasm, inflammation, and mucus production - cause symptoms such as difficulty breathing, wheezing, and coughing.  What are the most common symptoms of asthma? Asthma symptoms are not the same for everyone. They can even change from episode to episode in the same person. Also, you may have only one symptom of asthma, such as cough, but another person may have all the symptoms of asthma. It is important to know all the symptoms of asthma and to be aware that your asthma can present in any of these ways at any time. The most common symptoms include: . Coughing, especially at night  . Shortness of breath  . Wheezing  . Chest tightness, pain, or pressure   Who is affected by asthma? Asthma affects 22 million Americans; about 6 million of these are children under age 84. People who have a family history of asthma have an increased risk of developing the disease. Asthma is also more common in people who have allergies or who are exposed to tobacco smoke. However, anyone can develop asthma at any time. Some people may have asthma all of their lives, while others may develop it as adults.  What causes asthma? The airways in a person with asthma are very sensitive and react to many things, or "triggers." Contact with these triggers causes asthma symptoms. One of the most important parts of asthma control is to identify your triggers and then avoid them when possible. The only trigger you do not want to avoid is exercise. Pre-treatment with medicines before exercise can allow you to stay active yet avoid asthma symptoms. Common asthma triggers include: 1. Infections (colds, viruses, flu, sinus infections)  2. Exercise  3. Weather (changes in temperature and/or  humidity, cold air)  4. Tobacco smoke  5. Allergens (dust mites, pollens, pets, mold spores, cockroaches, and sometimes foods)  6. Irritants (strong odors from cleaning products, perfume, wood smoke, air pollution)  7. Strong emotions such as crying or laughing hard  8. Some medications   How is asthma diagnosed? To diagnose asthma, your doctor will first review your medical history, family history, and symptoms. Your doctor will want to know any past history of breathing problems you may have had, as well as a family history of asthma, allergies, eczema (a bumpy, itchy skin rash caused by allergies), or other lung disease. It is important that you describe your symptoms in detail (cough, wheeze, shortness of breath, chest tightness), including when and how often they occur. The doctor will perform a physical examination and listen to your heart and lungs. He or she may  also order breathing tests, allergy tests, blood tests, and chest and sinus X-rays. The tests will find out if you do have asthma and if there are any other conditions that are contributing factors.  How is asthma treated? Asthma can be controlled, but not cured. It is not normal to have frequent symptoms, trouble sleeping, or trouble completing tasks. Appropriate asthma care will prevent symptoms and visits to the emergency room and hospital. Asthma medicines are one of the mainstays of asthma treatment. The drugs used to treat asthma are explained below.  Anti-inflammatories: These are the most important drugs for most people with asthma. Anti-inflammatory drugs reduce swelling and mucus production in the airways. As a result, airways are less sensitive and less likely to react to triggers. These medications need to be taken daily and may need to be taken for several weeks before they begin to control asthma. Anti-inflammatory medicines lead to fewer symptoms, better airflow, less sensitive airways, less airway damage, and fewer  asthma attacks. If taken every day, they CONTROL or prevent asthma symptoms.   Bronchodilators: These drugs relax the muscle bands that tighten around the airways. This action opens the airways, letting more air in and out of the lungs and improving breathing. Bronchodilators also help clear mucus from the lungs. As the airways open, the mucus moves more freely and can be coughed out more easily. In short-acting forms, bronchodilators RELIEVE or stop asthma symptoms by quickly opening the airways and are very helpful during an asthma episode. In long-acting forms, bronchodilators provide CONTROL of asthma symptoms and prevent asthma episodes.  Asthma drugs can be taken in a variety of ways. Inhaling the medications by using a metered dose inhaler, dry powder inhaler, or nebulizer is one way of taking asthma medicines. Oral medicines (pills or liquids you swallow) may also be prescribed.  Asthma severity Asthma is classified as either "intermittent" (comes and goes) or "persistent" (lasting). Persistent asthma is further described as being mild, moderate, or severe. The severity of asthma is based on how often you have symptoms both during the day and night, as well as by the results of lung function tests and by how well you can perform activities. The "severity" of asthma refers to how "intense" or "strong" your asthma is.  Asthma control Asthma control is the goal of asthma treatment. Regardless of your asthma severity, it may or may not be controlled. Asthma control means: . You are able to do everything you want to do at work and home  . You have no (or minimal) asthma symptoms  . You do not wake up from your sleep or earlier than usual in the morning due to asthma  . You rarely need to use your reliever medicine (inhaler)  Another major part of your treatment is that you are happy with your asthma care and believe your asthma is controlled.  Monitoring symptoms A key part of treatment is  keeping track of how well your lungs are working. Monitoring your symptoms  what they are, how and when they happen, and how severe they are  is an important part of being able to control your asthma.  Sometimes asthma is monitored using a peak flow meter. A peak flow (PF) meter measures how fast the air comes out of your lungs. It can help you know when your asthma is getting worse, sometimes even before you have symptoms. By taking daily peak flow readings, you can learn when to adjust medications to keep asthma under good  control. It is also used to create your asthma action plan (see below). Your doctor can use your peak flow readings to adjust your treatment plan in some cases.  Asthma Action Plan Based on your history and asthma severity, you and your doctor will develop a care plan called an "asthma action plan." The asthma action plan describes when and how to use your medicines, actions to take when asthma worsens, and when to seek emergency care. Make sure you understand this plan. If you do not, ask your asthma care provider any questions you may have. Your asthma action plan is one of the keys to controlling asthma. Keep it readily available to remind you of what you need to do every day to control asthma and what you need to do when symptoms occur.  Goals of asthma therapy These are the goals of asthma treatment: . Live an active, normal life  . Prevent chronic and troublesome symptoms  . Attend work or school every day  . Perform daily activities without difficulty  . Stop urgent visits to the doctor, emergency department, or hospital  . Use and adjust medications to control asthma with few or no side effects

## 2019-10-11 NOTE — Progress Notes (Signed)
Melissa Kirby    [id number]    [birth year]  Primary Care Physician:Burns, Bobette Mo, MD  Referring Physician: Pincus Sanes, MD [address] [place],  [place] [address] Reason for Consultation: "covid 12.8.2020 Sob, chest tightness, unable to take deep breaths" Date of Consultation: 10/11/2019  Chief complaint:   Chief Complaint  Patient presents with  . Consult    sob, extremely tired all thge time, feels like a band around chest, raspy voice. hx asthma, covid 07/08/19     HPI: Diagnosed with Covid in Dec 2020 - fever, sore throat, cough. Resolved quickly. Mild course.  Since then has had progressive dyspnea with minimal exertion, raspy voice, fatigue and exhaustion.  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 Also has chest pain in the left side of her back when she takes a deep breath in. Better with ibuprofen but she has avoided taking this due to avoiding fever suppression agents due to work.   Environmental allergies are a trigger for her recurrent bronchitis - 2-3 times/year. More in the fall. Usually gets better with albuterol. Occasionally gets prednisone for this. Not in the past year.   Has had episodes of dyspnea with exercise in the past which she now recalls not being able to keep up with peers, resolves with rest.    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  Exposures: lives independently, no pets at home.  Smoking history: never smoker - passive smoke exposure in childhood  Social History   Occupational History  . Not on file  Tobacco Use  . Smoking status: Never Smoker  . Smokeless tobacco: Never Used  Substance and Sexual Activity  . Alcohol use: No    Alcohol/week: 0.0 standard drinks  . Drug use: No  . Sexual activity: Yes    Birth control/protection: Surgical    Comment: Married lives with spouse, 3 children. 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 Hyperlipidemia Mother   . Heart disease Mother 8       massive MI  . Hyperlipidemia Father   . COPD Father        severe, smoker  . Breast cancer Maternal Grandmother   . Hyperlipidemia Maternal Grandmother   . Coronary artery disease Maternal Grandmother 78       CABG  . Ovarian cancer Other   . Anesthesia problems Neg Hx   . Malignant hyperthermia Neg Hx   . Pseudochol deficiency Neg Hx   . Hypotension Neg Hx     Past Medical History:  Diagnosis Date  . ANEMIA, IRON DEFICIENCY   . Cerebrospinal fluid leak from spinal puncture    improved with 7th patch at [facility] 05/2014  . Depression    therapy  . Fibromyalgia 1999   no meds  . GASTRITIS 11/12/2009   Qualifier: Diagnosis of  By: Theadora Rama RN, Silvio Pate    . GERD (gastroesophageal reflux disease)    per endo. - does not take any meds.    . Kidney stones 1998, 2005   x 2  . MIGRAINE HEADACHE    last Migraine 06/29/2011-   . Unspecified hypothyroidism 1982/2000   surgical resection of benign growth - partial then complete  . VITAMIN B12 DEFICIENCY 09/2009 dx    Past Surgical History:  Procedure Laterality Date  . CHOLECYSTECTOMY  2009  . DIAGNOSTIC LAPAROSCOPY  1982   diagnostic  . POSTERIOR FUSION LUMBAR SPINE  06/2011  . THYROID SURGERY  1983   partial   . TOTAL THYROIDECTOMY  2000   Had thyroid nodule, nonmalignant ([id number]) resection  . TUBAL LIGATION  1989     Review of systems: Review of Systems  Constitutional: Negative for chills, fever and weight loss.  HENT: Positive for congestion and sore throat. Negative for sinus pain.   Eyes: Negative for discharge and redness.  Respiratory: Positive for cough, shortness of breath and wheezing. Negative for hemoptysis and sputum production.   Cardiovascular: Negative for chest pain, palpitations and leg swelling.  Gastrointestinal: Negative for heartburn, nausea and vomiting.   Musculoskeletal: Negative for joint pain and myalgias.  Skin: Negative for rash.  Neurological: Negative for dizziness, tremors, focal weakness and headaches.  Endo/Heme/Allergies: Positive for environmental allergies.  Psychiatric/Behavioral: Negative for depression. The patient is not nervous/anxious.   All other systems reviewed and are negative.   Physical Exam: Blood pressure 128/88, pulse (!) 101, temperature 98.3 F (36.8 C), height 5\' 3"  (1.6 m), weight 141 lb (64 kg), SpO2 96 %. Gen:      No acute distress ENT:  no nasal polyps, mucus membranes moist, +cobblestoning and nasal debris Lungs:    No increased respiratory effort, symmetric chest wall excursion, clear to auscultation bilaterally, no wheezes or crackles CV:         Regular rate and rhythm; no murmurs, rubs, or gallops.  No pedal edema Abd:      + bowel sounds; soft, non-tender; no distension MSK: left mid-clavicular back with reproducible tenderness to palpation Skin:      Warm and dry; no rashes Neuro: normal speech, no focal facial asymmetry Psych: alert and oriented x3, normal mood and affect   Data Reviewed/Medical Decision Making:  Independent interpretation of tests: Imaging: . Review of patient's chest xray Feb 2021 images revealed mild hyperinflation. The patient's images have been independently reviewed by me.    PFTs: None on file  Labs:  Vitamin D level low feb 2021   Immunization status:  Immunization History  Administered Date(s) Administered  . Tdap 08/29/2014    . I reviewed prior external note(s) from Dr, Quay Burow, Dr. Harl Bowie. . I reviewed the result(s) of the labs and imaging as noted above.  . I have ordered spirometry, exhaled NO  Assessment:  Mild Intermittent Asthma - longstanding but new diagnosis today.  Chronic Rhinitis COVID 19 infection  Costochondritis  Plan/Recommendations: Ibuprofen 600 mg no more than three times daily as needed for pain. Needs to take pantoprazole  while on this.  Continue albuterol prn. Ok to hold off on [facility] for now as she doesn't feel they are helping. She may eventually need ICS-LABA for asthma depending how will controlled her symptoms are.  Will start flonase for rhinitis and this will likely help her voice quality Spirometry and exhaled NO when she is feeling better - ordered today.  Suspect a lot of her symptoms are chronic from post-covid state - these symptoms may take some time to resolve spontaneously.  We discussed disease management and progression at length today.   Return to Care: Return in about 10 weeks (around 12/20/2019).  Lenice Llamas, MD Pulmonary and [place]  CC: Binnie Rail, MD

## 2019-10-17 ENCOUNTER — Other Ambulatory Visit (HOSPITAL_COMMUNITY): Payer: 59

## 2019-10-20 ENCOUNTER — Other Ambulatory Visit: Payer: 59

## 2019-10-31 ENCOUNTER — Other Ambulatory Visit (HOSPITAL_COMMUNITY)
Admission: RE | Admit: 2019-10-31 | Discharge: 2019-10-31 | Disposition: A | Discharge status: Home / Self Care | Payer: 59 | Source: Ambulatory Visit | Attending: Internal Medicine | Admitting: Internal Medicine

## 2019-10-31 DIAGNOSIS — Z01812 Encounter for preprocedural laboratory examination: Secondary | ICD-10-CM | POA: Diagnosis not present

## 2019-10-31 DIAGNOSIS — Z20822 Contact with and (suspected) exposure to covid-19: Secondary | ICD-10-CM | POA: Diagnosis not present

## 2019-10-31 LAB — SARS CORONAVIRUS 2 (TAT 6-24 HRS): SARS Coronavirus 2: NEGATIVE

## 2019-11-03 ENCOUNTER — Ambulatory Visit: Payer: 59

## 2019-11-03 ENCOUNTER — Other Ambulatory Visit: Payer: Self-pay

## 2019-11-03 DIAGNOSIS — J4520 Mild intermittent asthma, uncomplicated: Secondary | ICD-10-CM

## 2019-11-03 LAB — POCT EXHALED NITRIC OXIDE: FeNO level (ppb): 10

## 2019-11-29 ENCOUNTER — Other Ambulatory Visit (INDEPENDENT_AMBULATORY_CARE_PROVIDER_SITE_OTHER): Payer: 59

## 2019-11-29 ENCOUNTER — Encounter: Payer: Self-pay | Admitting: Internal Medicine

## 2019-11-29 ENCOUNTER — Telehealth (INDEPENDENT_AMBULATORY_CARE_PROVIDER_SITE_OTHER): Payer: 59 | Admitting: Internal Medicine

## 2019-11-29 DIAGNOSIS — S1096XA Insect bite of unspecified part of neck, initial encounter: Secondary | ICD-10-CM

## 2019-11-29 DIAGNOSIS — W57XXXA Bitten or stung by nonvenomous insect and other nonvenomous arthropods, initial encounter: Secondary | ICD-10-CM

## 2019-11-29 DIAGNOSIS — S1086XA Insect bite of other specified part of neck, initial encounter: Secondary | ICD-10-CM

## 2019-11-29 DIAGNOSIS — R52 Pain, unspecified: Secondary | ICD-10-CM | POA: Diagnosis not present

## 2019-11-29 LAB — COMPREHENSIVE METABOLIC PANEL
ALT: 10 U/L (ref 0–35)
AST: 17 U/L (ref 0–37)
Albumin: 4.2 g/dL (ref 3.5–5.2)
Alkaline Phosphatase: 140 U/L — ABNORMAL HIGH (ref 39–117)
BUN: 8 mg/dL (ref 6–23)
CO2: 26 mEq/L (ref 19–32)
Calcium: 9.7 mg/dL (ref 8.4–10.5)
Chloride: 104 mEq/L (ref 96–112)
Creatinine, Ser: 0.82 mg/dL (ref 0.40–1.20)
GFR: 71.69 mL/min (ref >60.00)
Glucose, Bld: 100 mg/dL — ABNORMAL HIGH (ref 70–99)
Potassium: 3.9 mEq/L (ref 3.5–5.1)
Sodium: 137 mEq/L (ref 135–145)
Total Bilirubin: 0.3 mg/dL (ref 0.2–1.2)
Total Protein: 7.8 g/dL (ref 6.0–8.3)

## 2019-11-29 LAB — CBC WITH DIFFERENTIAL/PLATELET
Basophils Absolute: 0 10*3/uL (ref 0.0–0.1)
Basophils Relative: 0.4 % (ref 0.0–3.0)
Eosinophils Absolute: 0.2 10*3/uL (ref 0.0–0.7)
Eosinophils Relative: 2.2 % (ref 0.0–5.0)
HCT: 36.4 % (ref 36.0–46.0)
Hemoglobin: 12.2 g/dL (ref 12.0–15.0)
Lymphocytes Relative: 42.6 % (ref 12.0–46.0)
Lymphs Abs: 3.3 10*3/uL (ref 0.7–4.0)
MCHC: 33.5 g/dL (ref 30.0–36.0)
MCV: 89.2 fl (ref 78.0–100.0)
Monocytes Absolute: 0.6 10*3/uL (ref 0.1–1.0)
Monocytes Relative: 8.4 % (ref 3.0–12.0)
Neutro Abs: 3.6 10*3/uL (ref 1.4–7.7)
Neutrophils Relative %: 46.4 % (ref 43.0–77.0)
Platelets: 238 10*3/uL (ref 150.0–400.0)
RBC: 4.08 Mil/uL (ref 3.87–5.11)
RDW: 12.8 % (ref 11.5–15.5)
WBC: 7.7 10*3/uL (ref 4.0–10.5)

## 2019-11-29 NOTE — Assessment & Plan Note (Signed)
Acute Insect bite of neck-occurred 10 days ago No evidence of infection She did see the insect and it was a tick and given her current symptoms we will rule out tickborne diseases

## 2019-11-29 NOTE — Addendum Note (Signed)
Addended by: Mercer Pod E on: 11/29/2019 04:48 PM   Modules accepted: Orders

## 2019-11-29 NOTE — Assessment & Plan Note (Signed)
Acute Start in the last 24 hours Diffuse body aches-joint pain and muscle pain Associated with dizziness, chills, headaches, fatigue She had a tick bite 10 days ago and is concerned about the possibility of a tickborne disease-blood work to rule that out CBC, CMP Possibly viral illness.  She already had Covid and most likely still has antibodies to protect her, but if no improvement she may need to get retested Symptomatic treatment for now Further evaluation depending on above test results

## 2019-11-29 NOTE — Assessment & Plan Note (Addendum)
Acute Occurred 10 days ago Developed symptoms within the past 24 hours concerning for the possibility of tickborne disease Given the seriousness of tickborne diseases further evaluation is warranted We will check blood work to rule out Lyme, Babesia in her lower ecchymosis Check CBC, CMP We will hold off on antibiotics until we get the blood work results She will update me on her symptoms in the next day or 2

## 2019-11-29 NOTE — Progress Notes (Signed)
Virtual Visit via Video Note  I connected with Melissa Kirby on 11/29/19 at  3:00 PM EDT by a video enabled telemedicine application and verified that I am speaking with the correct person using two identifiers.   I discussed the limitations of evaluation and management by telemedicine and the availability of in person appointments. The patient expressed understanding and agreed to proceed.  Present for the visit:  Myself, Dr Billey Gosling, [name].  The patient is currently at home and I am in the office.    No referring provider.    History of Present Illness: This is an acute visit for headache, chills and achiness.   About 10 days ago she had a tick bite on her posterior left neck.  Last night she woke up with a headache, chills, achy.  The achiness feels like it is all over-her joints and her muscles.  She has been experiencing dizziness/lightheadedness.  She denies fever or rash.  She has not had any fevers.  She denies typical cold symptoms, cough, wheeze and shortness of breath.  She has not had any diarrhea or abdominal pain.  She wondered if this was something viral, but was concerned about her recent tick bite and was worried about the possibility of Lyme disease.   Review of Systems  Constitutional: Positive for chills and malaise/fatigue. Negative for fever.  HENT: Negative for congestion, sinus pain and sore throat.   Respiratory: Negative for cough, shortness of breath and wheezing.   Cardiovascular: Positive for palpitations. Negative for chest pain and leg swelling.  Gastrointestinal: Positive for nausea (with standing up). Negative for abdominal pain, constipation and diarrhea.  Musculoskeletal: Positive for joint pain and myalgias.  Skin: Negative for rash.  Neurological: Positive for dizziness and headaches.      Social History   Socioeconomic History  . Marital status: Divorced    Spouse name: Not on file  . Number of children: 3  . Years of education: Not on  file  . Highest education level: Not on file  Occupational History  . Not on file  Tobacco Use  . Smoking status: Never Smoker  . Smokeless tobacco: Never Used  Substance and Sexual Activity  . Alcohol use: No    Alcohol/week: 0.0 standard drinks  . Drug use: No  . Sexual activity: Yes    Birth control/protection: Surgical    Comment: Married lives with spouse, 3 children. 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 Not on file  Social History Narrative   Ongoing divorce from husband, separated since 05/2014. Lives alone     Works at [employer] and [employer].  Education: high school 12th grade.      Exercises regularly   Social Determinants of Health   Financial Resource Strain:   . Difficulty of Paying Living Expenses:   Food Insecurity:   . Worried About Charity fundraiser in the Last Year:   . Arboriculturist in the Last Year:   Transportation Needs:   . Film/video editor (Medical):   Marland Kitchen Lack of Transportation (Non-Medical):   Physical Activity:   . Days of Exercise per Week:   . Minutes of Exercise per Session:   Stress:   . Feeling of Stress :   Social Connections:   . Frequency of Communication with Friends and Family:   . Frequency of Social Gatherings with Friends and Family:   . Attends Religious Services:   . Active  Member of Clubs or Organizations:   . Attends Banker Meetings:   Marland Kitchen Marital Status:      Observations/Objective: Appears well in NAD, but appears mildly ill Breathing normally, speaking full sentences Skin appears warm and dry   Assessment and Plan:  See Problem List for Assessment and Plan of chronic medical problems.   Follow Up Instructions:    I discussed the assessment and treatment plan with the patient. The patient was provided an opportunity to ask questions and all were answered. The patient agreed with the plan and demonstrated an understanding of the instructions.   The patient was  advised to call back or seek an in-person evaluation if the symptoms worsen or if the condition fails to improve as anticipated.    Pincus Sanes, MD

## 2019-11-30 ENCOUNTER — Encounter: Payer: Self-pay | Admitting: Internal Medicine

## 2019-11-30 LAB — BABESIA MICROTI ANTIBODY PANEL
Babesia microti IgG: <1:10 {titer}
Babesia microti IgM: <1:10 {titer}

## 2019-12-01 ENCOUNTER — Encounter: Payer: Self-pay | Admitting: Internal Medicine

## 2019-12-01 LAB — EHRLICHIA ANTIBODY PANEL
E. CHAFFEENSIS AB IGG: <1:64 {titer}
E. CHAFFEENSIS AB IGM: <1:20 {titer}

## 2019-12-01 LAB — B. BURGDORFI ANTIBODIES: B burgdorferi Ab IgG+IgM: <0.9 index

## 2019-12-02 ENCOUNTER — Encounter: Payer: Self-pay | Admitting: Internal Medicine

## 2019-12-05 ENCOUNTER — Encounter: Payer: Self-pay | Admitting: Internal Medicine

## 2019-12-09 NOTE — Telephone Encounter (Signed)
Patient dropped off FMLA forms. Forms have been completed &Placed in providers box to review and sign.

## 2019-12-13 DIAGNOSIS — Z0279 Encounter for issue of other medical certificate: Secondary | ICD-10-CM

## 2019-12-13 NOTE — Telephone Encounter (Signed)
Forms have been signed, Faxed to [employer] @ [phone], Copy sent to scan &Charged for.   My chart message to inform patient.

## 2019-12-20 ENCOUNTER — Ambulatory Visit: Payer: 59 | Admitting: Internal Medicine

## 2019-12-20 ENCOUNTER — Other Ambulatory Visit: Payer: Self-pay

## 2019-12-20 ENCOUNTER — Encounter: Payer: Self-pay | Admitting: Internal Medicine

## 2019-12-20 VITALS — BP 122/76 | HR 94 | Temp 98.8°F | Ht 63.0 in | Wt 137.2 lb

## 2019-12-20 DIAGNOSIS — J4520 Mild intermittent asthma, uncomplicated: Secondary | ICD-10-CM | POA: Diagnosis not present

## 2019-12-20 NOTE — Progress Notes (Signed)
Melissa Kirby    [id number]    May 18, 1961  Primary Care Physician:Burns, Bobette Mo, MD Date of Appointment: 12/20/2019 Established Patient Visit  Chief complaint:   Chief Complaint  Patient presents with  . Follow-up    Pt states she is doing good since last visit.     HPI: Melissa Kirby is a 59 y.o. woman with covid 19 infection in Dec 2020 and mild intermittent asthma.   Interval Updates: Here for follow up today. Feeling much better. Taking albuterol once/week usually after exposure to a fragrance at work.  Stopped flonase because drainage.   I have reviewed the patient's family social and past medical history and updated as appropriate.   Past Medical History:  Diagnosis Date  . ANEMIA, IRON DEFICIENCY   . Cerebrospinal fluid leak from spinal puncture    improved with 7th patch at [facility] 05/2014  . Depression    therapy  . Fibromyalgia 1999   no meds  . GASTRITIS 11/12/2009   Qualifier: Diagnosis of  By: Theadora Rama RN, Silvio Pate    . GERD (gastroesophageal reflux disease)    per endo. - does not take any meds.    . Kidney stones 1998, 2005   x 2  . MIGRAINE HEADACHE    last Migraine 06/29/2011-   . Unspecified hypothyroidism 1982/2000   surgical resection of benign growth - partial then complete  . VITAMIN B12 DEFICIENCY 09/2009 dx    Past Surgical History:  Procedure Laterality Date  . CHOLECYSTECTOMY  2009  . DIAGNOSTIC LAPAROSCOPY  1982   diagnostic  . POSTERIOR FUSION LUMBAR SPINE  06/2011  . THYROID SURGERY  1983   partial   . TOTAL THYROIDECTOMY  2000   Had thyroid nodule, nonmalignant ([id number]) resection  . TUBAL LIGATION  1989    Family History  Problem Relation Age of Onset  . Hyperlipidemia Mother   . Heart disease Mother 4       massive MI  . Hyperlipidemia Father   . COPD Father        severe, smoker  . Breast cancer Maternal Grandmother   . Hyperlipidemia Maternal Grandmother   . Coronary artery disease Maternal Grandmother 78    CABG  . Ovarian cancer Other   . Anesthesia problems Neg Hx   . Malignant hyperthermia Neg Hx   . Pseudochol deficiency Neg Hx   . Hypotension Neg Hx     Social History   Occupational History  . Not on file  Tobacco Use  . Smoking status: Never Smoker  . Smokeless tobacco: Never Used  Substance and Sexual Activity  . Alcohol use: No    Alcohol/week: 0.0 standard drinks  . Drug use: No  . Sexual activity: Yes    Birth control/protection: Surgical    Comment: Married lives with spouse, 3 children. 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  Gen:      No acute distress Lungs:    No increased respiratory effort, symmetric chest wall excursion, clear to auscultation bilaterally, no wheezes or crackles CV:         Regular rate and rhythm; no murmurs, rubs, or gallops.  No pedal edema   Data Reviewed: Imaging - chest xray with mild hyperinflation in Feb  2021.   PFTs: Spirometry 11/03/19 is normal. FeNO 10ppb.   Labs:  Immunization status: Immunization History  Administered Date(s) Administered  . Tdap 08/29/2014    Assessment:  Mild Intermittent Asthma Chronic Rhinitis  Plan/Recommendations: Continue prn albuterol. Current use less than once/week. Continue flonase.  She will let us know if there is a change in symptoms sooner.  Return to Care: Return in about 6 months (around 06/20/2020).   Lenice Llamas, MD Pulmonary and [place]

## 2019-12-20 NOTE — Patient Instructions (Addendum)
The patient should have follow up scheduled with myself in 6 months.   Take the albuterol rescue inhaler every 4 to 6 hours as needed for wheezing or shortness of breath. You can also take it 15 minutes before exercise or exertional activity. Side effects include heart racing or pounding, jitters or anxiety. If you have a history of an irregular heart rhythm, it can make this worse. Can also give some patients a hard time sleeping.  To inhale the aerosol using an inhaler, follow these steps:  1. Remove the protective dust cap from the end of the mouthpiece. If the dust cap was not placed on the mouthpiece, check the mouthpiece for dirt or other objects. Be sure that the canister is fully and firmly inserted in the mouthpiece. 2. If you are using the inhaler for the first time or if you have not used the inhaler in more than 14 days, you will need to prime it. You may also need to prime the inhaler if it has been dropped. Ask your pharmacist or check the manufacturer's information if this happens. To prime the inhaler, shake it well and then press down on the canister 4 times to release 4 sprays into the air, away from your face. Be careful not to get albuterol in your eyes. 3. Shake the inhaler well. 4. Breathe out as completely as possible through your mouth. 4. Hold the canister with the mouthpiece on the bottom, facing you and the canister pointing upward. Place the open end of the mouthpiece into your mouth. Close your lips tightly around the mouthpiece. 6. Breathe in slowly and deeply through the mouthpiece.At the same time, press down once on the container to spray the medication into your mouth. 7. Try to hold your breath for 10 seconds. remove the inhaler, and breathe out slowly. 8. If you were told to use 2 puffs, wait 1 minute and then repeat steps 3-7. 9. Replace the protective cap on the inhaler. 10. Clean your inhaler regularly. Follow the manufacturer's directions carefully and ask  your doctor or pharmacist if you have any questions about cleaning your inhaler.  Check the back of the inhaler to keep track of the total number of doses left on the inhaler.    

## 2020-03-13 ENCOUNTER — Encounter: Payer: Self-pay | Admitting: Internal Medicine

## 2020-03-13 ENCOUNTER — Telehealth (INDEPENDENT_AMBULATORY_CARE_PROVIDER_SITE_OTHER): Payer: 59 | Admitting: Internal Medicine

## 2020-03-13 DIAGNOSIS — J0100 Acute maxillary sinusitis, unspecified: Secondary | ICD-10-CM

## 2020-03-13 DIAGNOSIS — H1033 Unspecified acute conjunctivitis, bilateral: Secondary | ICD-10-CM | POA: Diagnosis not present

## 2020-03-13 DIAGNOSIS — H109 Unspecified conjunctivitis: Secondary | ICD-10-CM | POA: Insufficient documentation

## 2020-03-13 MED ORDER — POLYMYXIN B-TRIMETHOPRIM 10000-0.1 UNIT/ML-% OP SOLN
1.0000 [drp] | OPHTHALMIC | 0 refills | Status: DC
Start: 2020-03-13 — End: 2020-06-19

## 2020-03-13 MED ORDER — AMOXICILLIN-POT CLAVULANATE 875-125 MG PO TABS: 1.0000 | ORAL_TABLET | Freq: Two times a day (BID) | ORAL | 0 refills | Status: DC

## 2020-03-13 NOTE — Assessment & Plan Note (Signed)
Likely bacterial  Start augmentin otc cold medications Rest, fluid Call if no improvement  

## 2020-03-13 NOTE — Progress Notes (Signed)
Virtual Visit via Video Note  I connected with Melissa Kirby on 03/13/20 at  9:30 AM EDT by a video enabled telemedicine application and verified that I am speaking with the correct person using two identifiers.   I discussed the limitations of evaluation and management by telemedicine and the availability of in person appointments. The patient expressed understanding and agreed to proceed.  Present for the visit:  Myself, Dr Cheryll Cockayne, Suezanne Jacquet.  The patient is currently at home and I am in the office.    No referring provider.    History of Present Illness: This is an acute visit for cold symptoms.  Her symptoms started 2 days ago.  She states dizziness, nausea, eye pain and pressure, eye crusting in gunk, especially first thing in the morning and they feel irritated.  She has congestion, but not able to blow her nose.  She states sinus pressure.  Overall she just does not feel well.  She has not had any fevers, coughing or shortness of breath.  She denies diarrhea.  She has had COVID.  She does need to get tested in order to return to work.  She did not go to work yesterday or today and will need a note.  She has used allergy eyedrops and they have not been helping.  She also took an over-the-counter sinus medication without improvement.  She is using lysine on a cold sore.     Review of Systems  Constitutional: Negative for fever.  HENT: Positive for congestion and sinus pain. Negative for ear pain and sore throat.   Eyes:       Eye pressure  Respiratory: Negative for cough, shortness of breath and wheezing.   Gastrointestinal: Positive for nausea. Negative for diarrhea.  Musculoskeletal: Negative for myalgias.  Neurological: Positive for dizziness and headaches.      Social History   Socioeconomic History  . Marital status: Divorced    Spouse name: Not on file  . Number of children: 3  . Years of education: Not on file  . Highest education level: Not on file   Occupational History  . Not on file  Tobacco Use  . Smoking status: Never Smoker  . Smokeless tobacco: Never Used  Vaping Use  . Vaping Use: Never used  Substance and Sexual Activity  . Alcohol use: No    Alcohol/week: 0.0 standard drinks  . Drug use: No  . Sexual activity: Yes    Birth control/protection: Surgical    Comment: Married lives with spouse, 3 children. 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 Not on file  Social History Narrative   Ongoing divorce from husband, separated since 05/2014. Lives alone     Works at [employer] and [employer].  Education: high school 12th grade.      Exercises regularly   Social Determinants of Health   Financial Resource Strain:   . Difficulty of Paying Living Expenses: Not on file  Food Insecurity:   . Worried About Programme researcher, broadcasting/film/video in the Last Year: Not on file  . Ran Out of Food in the Last Year: Not on file  Transportation Needs:   . Lack of Transportation (Medical): Not on file  . Lack of Transportation (Non-Medical): Not on file  Physical Activity:   . Days of Exercise per Week: Not on file  . Minutes of Exercise per Session: Not on file  Stress:   . Feeling of Stress : Not  on file  Social Connections:   . Frequency of Communication with Friends and Family: Not on file  . Frequency of Social Gatherings with Friends and Family: Not on file  . Attends Religious Services: Not on file  . Active Member of Clubs or Organizations: Not on file  . Attends Banker Meetings: Not on file  . Marital Status: Not on file     Observations/Objective: Appears well in NAD Unable to visualize eyes well enough to evaluate for conjunctivitis Breathing normally    Assessment and Plan:  See Problem List for Assessment and Plan of chronic medical problems.   Follow Up Instructions:    I discussed the assessment and treatment plan with the patient. The patient was provided an opportunity to  ask questions and all were answered. The patient agreed with the plan and demonstrated an understanding of the instructions.   The patient was advised to call back or seek an in-person evaluation if the symptoms worsen or if the condition fails to improve as anticipated.    Pincus Sanes, MD

## 2020-03-13 NOTE — Assessment & Plan Note (Signed)
Acute Symptoms consistent with possible bacterial conjunctivitis Unable to visualize eyes well on video We will start Polytrim drops

## 2020-03-29 ENCOUNTER — Other Ambulatory Visit: Payer: Self-pay | Admitting: Internal Medicine

## 2020-06-18 NOTE — Progress Notes (Signed)
Virtual Visit via Video Note  I connected with Melissa Kirby on 06/18/20 at  9:15 AM EST by a video enabled telemedicine application and verified that I am speaking with the correct person using two identifiers.   I discussed the limitations of evaluation and management by telemedicine and the availability of in person appointments. The patient expressed understanding and agreed to proceed.  Present for the visit:  Myself, Dr Cheryll Cockayne, Suezanne Jacquet.  The patient is currently at home and I am in the office.    No referring provider.    History of Present Illness: She is here for an acute visit for cold symptoms.   Her symptoms started 1 week ago.  She is experiencing cough, sore throat.  Her symptoms have worsened.  She states fever, fatigue, nasal congestion, sinus pain, sore throat, postnasal drip, cough, mild wheeze, tightness in her chest, headaches and difficulty sleeping due to her symptoms.  She has tried taking alka seltzer cold and cough. Her inhaler.  Both of these have helped, but her symptoms have worsened.   She had a covid test which was negative.   Review of Systems  Constitutional: Positive for fever and malaise/fatigue.  HENT: Positive for congestion, sinus pain and sore throat. Negative for ear pain.        PND  Respiratory: Positive for cough and wheezing. Negative for shortness of breath.        Tightness in chest  Neurological: Positive for headaches.  Psychiatric/Behavioral: The patient has insomnia.      Social History   Socioeconomic History  . Marital status: Divorced    Spouse name: Not on file  . Number of children: 3  . Years of education: Not on file  . Highest education level: Not on file  Occupational History  . Not on file  Tobacco Use  . Smoking status: Never Smoker  . Smokeless tobacco: Never Used  Vaping Use  . Vaping Use: Never used  Substance and Sexual Activity  . Alcohol use: No    Alcohol/week: 0.0 standard drinks  . Drug use:  No  . Sexual activity: Yes    Birth control/protection: Surgical    Comment: Married lives with spouse, 3 children. 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 Not on file  Social History Narrative   Ongoing divorce from husband, separated since 05/2014. Lives alone     Works at [employer] and [employer].  Education: high school 12th grade.      Exercises regularly   Social Determinants of Health   Financial Resource Strain:   . Difficulty of Paying Living Expenses: Not on file  Food Insecurity:   . Worried About Programme researcher, broadcasting/film/video in the Last Year: Not on file  . Ran Out of Food in the Last Year: Not on file  Transportation Needs:   . Lack of Transportation (Medical): Not on file  . Lack of Transportation (Non-Medical): Not on file  Physical Activity:   . Days of Exercise per Week: Not on file  . Minutes of Exercise per Session: Not on file  Stress:   . Feeling of Stress : Not on file  Social Connections:   . Frequency of Communication with Friends and Family: Not on file  . Frequency of Social Gatherings with Friends and Family: Not on file  . Attends Religious Services: Not on file  . Active Member of Clubs or Organizations: Not on file  . Attends  Club or Organization Meetings: Not on file  . Marital Status: Not on file     Observations/Objective: Appears well in NAD Breathing normally Skin appears warm and dry  Assessment and Plan:  See Problem List for Assessment and Plan of chronic medical problems.  Note provided for work-she is not able to return until her fever is completely gone.  Follow Up Instructions:    I discussed the assessment and treatment plan with the patient. The patient was provided an opportunity to ask questions and all were answered. The patient agreed with the plan and demonstrated an understanding of the instructions.   The patient was advised to call back or seek an in-person evaluation if the symptoms worsen or if  the condition fails to improve as anticipated.    Pincus Sanes, MD

## 2020-06-19 ENCOUNTER — Other Ambulatory Visit: Payer: Self-pay

## 2020-06-19 ENCOUNTER — Telehealth (INDEPENDENT_AMBULATORY_CARE_PROVIDER_SITE_OTHER): Payer: 59 | Admitting: Internal Medicine

## 2020-06-19 ENCOUNTER — Encounter: Payer: Self-pay | Admitting: Internal Medicine

## 2020-06-19 DIAGNOSIS — J0100 Acute maxillary sinusitis, unspecified: Secondary | ICD-10-CM | POA: Diagnosis not present

## 2020-06-19 DIAGNOSIS — J45901 Unspecified asthma with (acute) exacerbation: Secondary | ICD-10-CM

## 2020-06-19 MED ORDER — HYDROCODONE-HOMATROPINE 5-1.5 MG/5ML PO SYRP: 5.0000 mL | ORAL_SOLUTION | Freq: Three times a day (TID) | ORAL | 0 refills | Status: DC | PRN

## 2020-06-19 MED ORDER — CEFDINIR 300 MG PO CAPS
300.0000 mg | ORAL_CAPSULE | Freq: Two times a day (BID) | ORAL | 0 refills | Status: DC
Start: 2020-06-19 — End: 2020-08-27

## 2020-06-19 NOTE — Assessment & Plan Note (Signed)
Acute Likely bacterial with possible early bronchitis and mild asthma exacerbation No improvement with over-the-counter cold medications and symptoms are worsening Start Omnicef 300 mg twice daily x10 days Over-the-counter cold medications, Hycodan cough syrup sent to pharmacy Continue albuterol inhaler Call if no improvement

## 2020-06-19 NOTE — Assessment & Plan Note (Signed)
Acute Mild exacerbation of her asthma Has been taking albuterol inhaler more for increased cough, mild wheeze and tightness in her chest At this time we both feel no steroids are necessary Continue albuterol as needed

## 2020-06-25 ENCOUNTER — Telehealth: Payer: Self-pay | Admitting: Internal Medicine

## 2020-06-25 NOTE — Telephone Encounter (Signed)
Patient dropped off FMLA forms for continuous leave starting 11/29 to 12/3 - Return to work 12/6.  LOV:11/30

## 2020-06-26 NOTE — Telephone Encounter (Signed)
Forms have been completed and placed in providers box to review and sign.  °

## 2020-06-27 DIAGNOSIS — Z0279 Encounter for issue of other medical certificate: Secondary | ICD-10-CM

## 2020-06-27 NOTE — Telephone Encounter (Signed)
Forms have been signed, Faxed to [employer] @[phone] , Copy sent to scan &Charged for.   LVM to inform patient &Original mailed to patient for her records.

## 2020-08-25 ENCOUNTER — Other Ambulatory Visit: Payer: Self-pay

## 2020-08-25 ENCOUNTER — Encounter (HOSPITAL_COMMUNITY): Payer: Self-pay | Admitting: Emergency Medicine

## 2020-08-25 ENCOUNTER — Emergency Department (HOSPITAL_COMMUNITY): Payer: 59

## 2020-08-25 ENCOUNTER — Emergency Department (HOSPITAL_COMMUNITY)
Admission: EM | Admit: 2020-08-25 | Discharge: 2020-08-26 | Disposition: A | Discharge status: Home / Self Care | Payer: 59 | Attending: Emergency Medicine | Admitting: Emergency Medicine

## 2020-08-25 DIAGNOSIS — R0789 Other chest pain: Secondary | ICD-10-CM | POA: Insufficient documentation

## 2020-08-25 DIAGNOSIS — R112 Nausea with vomiting, unspecified: Secondary | ICD-10-CM | POA: Diagnosis not present

## 2020-08-25 DIAGNOSIS — I10 Essential (primary) hypertension: Secondary | ICD-10-CM | POA: Diagnosis not present

## 2020-08-25 DIAGNOSIS — Z20822 Contact with and (suspected) exposure to covid-19: Secondary | ICD-10-CM | POA: Insufficient documentation

## 2020-08-25 DIAGNOSIS — Z8616 Personal history of COVID-19: Secondary | ICD-10-CM | POA: Diagnosis not present

## 2020-08-25 DIAGNOSIS — J45909 Unspecified asthma, uncomplicated: Secondary | ICD-10-CM | POA: Diagnosis not present

## 2020-08-25 DIAGNOSIS — Z79899 Other long term (current) drug therapy: Secondary | ICD-10-CM | POA: Insufficient documentation

## 2020-08-25 DIAGNOSIS — R1013 Epigastric pain: Secondary | ICD-10-CM | POA: Diagnosis not present

## 2020-08-25 DIAGNOSIS — E039 Hypothyroidism, unspecified: Secondary | ICD-10-CM | POA: Insufficient documentation

## 2020-08-25 DIAGNOSIS — R079 Chest pain, unspecified: Secondary | ICD-10-CM | POA: Diagnosis present

## 2020-08-25 MED ORDER — ALUM & MAG HYDROXIDE-SIMETH 200-200-20 MG/5ML PO SUSP
30.0000 mL | Freq: Once | ORAL | Status: AC
  Administered 2020-08-25: 30 mL via ORAL
  Filled 2020-08-25: qty 30

## 2020-08-25 MED ORDER — LIDOCAINE VISCOUS HCL 2 % MT SOLN
15.0000 mL | Freq: Once | OROMUCOSAL | Status: AC
  Administered 2020-08-25: 15 mL via ORAL
  Filled 2020-08-25: qty 15

## 2020-08-25 MED ORDER — ONDANSETRON HCL 4 MG/2ML IJ SOLN
4.0000 mg | Freq: Once | INTRAMUSCULAR | Status: AC
  Administered 2020-08-25: 4 mg via INTRAVENOUS
  Filled 2020-08-25: qty 2

## 2020-08-25 MED ORDER — LORAZEPAM 2 MG/ML IJ SOLN
1.0000 mg | Freq: Once | INTRAMUSCULAR | Status: AC
  Administered 2020-08-25: 1 mg via INTRAVENOUS
  Filled 2020-08-25: qty 1

## 2020-08-25 NOTE — ED Provider Notes (Signed)
[facility] EMERGENCY DEPARTMENT Provider Note   CSN: [id number] Arrival date & time: 08/25/20  2320     History Chief Complaint  Patient presents with  . Chest Pain    Melissa Kirby is a 60 y.o. female.  Patient presents to the emergency department for evaluation of central chest pain.  Patient reports that she had onset of nausea and vomiting approximately an hour ago.  After the vomiting began she started having severe central chest pain that radiates through into her back.  No hematemesis.  No rectal bleeding or melena.        Past Medical History:  Diagnosis Date  . ANEMIA, IRON DEFICIENCY   . Cerebrospinal fluid leak from spinal puncture    improved with 7th patch at [facility] 05/2014  . Depression    therapy  . Fibromyalgia 1999   no meds  . GASTRITIS 11/12/2009   Qualifier: Diagnosis of  By: Theadora Rama RN, Silvio Pate    . GERD (gastroesophageal reflux disease)    per endo. - does not take any meds.    . Kidney stones 1998, 2005   x 2  . MIGRAINE HEADACHE    last Migraine 06/29/2011-   . Unspecified hypothyroidism 1982/2000   surgical resection of benign growth - partial then complete  . VITAMIN B12 DEFICIENCY 09/2009 dx    Patient Active Problem List   Diagnosis Date Noted  . Asthma exacerbation, mild 06/19/2020  . Conjunctivitis 03/13/2020  . Bite, insect 11/29/2019  . Tick bite of neck 11/29/2019  . Generalized body aches 11/29/2019  . Hypertension 08/09/2019  . COVID-19 07/04/2019  . History of CMV 09/20/2018  . Lumbar radiculopathy 05/20/2018  . Lumbar back pain with radiculopathy affecting right lower extremity 03/03/2018  . Insomnia 12/25/2017  . Acute sinus infection 09/18/2017  . Osteopenia 08/19/2017  . Gluten intolerance 08/12/2017  . Exercise-induced asthma 08/12/2017  . SOB (shortness of breath) 09/02/2016  . Intracranial hypotension 01/24/2014  . Vitamin D deficiency   . Abnormal brain MRI 10/15/2013  . LBP (low back pain) 05/10/2012  .  Fibromyalgia   . Fatigue 01/17/2011  . Atrophic vaginitis 10/17/2010  . Vitamin B12 deficiency (non anemic) 10/04/2009  . Hypothyroidism 10/03/2009  . Migraine headache 10/03/2009    Past Surgical History:  Procedure Laterality Date  . CHOLECYSTECTOMY  2009  . DIAGNOSTIC LAPAROSCOPY  1982   diagnostic  . POSTERIOR FUSION LUMBAR SPINE  06/2011  . THYROID SURGERY  1983   partial   . TOTAL THYROIDECTOMY  2000   Had thyroid nodule, nonmalignant ([id number]) resection  . TUBAL LIGATION  1989     OB History   No obstetric history on file.     Family History  Problem Relation Age of Onset  . Hyperlipidemia Mother   . Heart disease Mother 1       massive MI  . Hyperlipidemia Father   . COPD Father        severe, smoker  . Breast cancer Maternal Grandmother   . Hyperlipidemia Maternal Grandmother   . Coronary artery disease Maternal Grandmother 78       CABG  . Ovarian cancer Other   . Anesthesia problems Neg Hx   . Malignant hyperthermia Neg Hx   . Pseudochol deficiency Neg Hx   . Hypotension Neg Hx     Social History   Tobacco Use  . Smoking status: Never Smoker  . Smokeless tobacco: Never Used  Vaping Use  . Vaping Use: Never  used  Substance Use Topics  . Alcohol use: No    Alcohol/week: 0.0 standard drinks  . Drug use: No    Home Medications Prior to Admission medications   Medication Sig Start Date End Date Taking? Authorizing Provider  pantoprazole (PROTONIX) 40 MG tablet Take 1 tablet (40 mg total) by mouth daily. 08/26/20  Yes Saskia Simerson, Canary Brim, MD  albuterol (VENTOLIN HFA) 108 (90 Base) MCG/ACT inhaler Inhale 2 puffs into the lungs every 6 (six) hours as needed for wheezing or shortness of breath. 10/05/19   Pincus Sanes, MD  cefdinir (OMNICEF) 300 MG capsule Take 1 capsule (300 mg total) by mouth 2 (two) times daily. 06/19/20   Pincus Sanes, MD  cyanocobalamin 2000 MCG tablet Take 2,000 mcg by mouth daily.    [provider]   HYDROcodone-homatropine (HYCODAN) 5-1.5 MG/5ML syrup Take 5 mLs by mouth every 8 (eight) hours as needed for cough. 06/19/20   Pincus Sanes, MD  levothyroxine (SYNTHROID) 125 MCG tablet TAKE 1 TABLET BY MOUTH EVERY DAY 09/16/19   Pincus Sanes, MD  Multiple Vitamin (MULTIVITAMIN) capsule Take 1 capsule by mouth daily. 08/31/19   Pincus Sanes, MD  nortriptyline (PAMELOR) 50 MG capsule TAKE 2 CAPSULES (100 MG TOTAL) BY MOUTH AT BEDTIME. 03/29/20   Burns, Bobette Mo, MD  ondansetron (ZOFRAN) 4 MG tablet Take 1 tablet (4 mg total) by mouth every 6 (six) hours as needed for nausea or vomiting. 08/26/20  Yes Saina Waage, Canary Brim, MD    Allergies    Compazine, Metoclopramide hcl, Prochlorperazine edisylate, Gluten meal, Prozac [fluoxetine hcl], Tramadol, and Morphine and related  Review of Systems   Review of Systems  Cardiovascular: Positive for chest pain.  Gastrointestinal: Positive for nausea and vomiting.  All other systems reviewed and are negative.   Physical Exam Updated Vital Signs BP (!) 143/91   Pulse (!) 124   Temp 98.4 F (36.9 C) (Oral)   Resp 19   Ht 5\' 3"  (1.6 m)   Wt 64.4 kg   SpO2 95%   BMI 25.15 kg/m   Physical Exam Vitals and nursing note reviewed.  Constitutional:      General: She is not in acute distress.    Appearance: Normal appearance. She is well-developed and well-nourished.  HENT:     Head: Normocephalic and atraumatic.     Right Ear: Hearing normal.     Left Ear: Hearing normal.     Nose: Nose normal.     Mouth/Throat:     Mouth: Oropharynx is clear and moist and mucous membranes are normal.  Eyes:     Extraocular Movements: EOM normal.     Conjunctiva/sclera: Conjunctivae normal.     Pupils: Pupils are equal, round, and reactive to light.  Cardiovascular:     Rate and Rhythm: Regular rhythm. Tachycardia present.     Heart sounds: S1 normal and S2 normal. No murmur heard. No friction rub. No gallop.   Pulmonary:     Effort: Pulmonary effort is  normal. No respiratory distress.     Breath sounds: Normal breath sounds.  Chest:     Chest wall: Tenderness present.    Abdominal:     General: Bowel sounds are normal.     Palpations: Abdomen is soft. There is no hepatosplenomegaly.     Tenderness: There is abdominal tenderness in the epigastric area. There is no guarding or rebound. Negative signs include Murphy's sign and McBurney's sign.     Hernia:  No hernia is present.  Musculoskeletal:        General: Normal range of motion.     Cervical back: Normal range of motion and neck supple.  Skin:    General: Skin is warm, dry and intact.     Findings: No rash.     Nails: There is no cyanosis.  Neurological:     Mental Status: She is alert and oriented to person, place, and time.     GCS: GCS eye subscore is 4. GCS verbal subscore is 5. GCS motor subscore is 6.     Cranial Nerves: No cranial nerve deficit.     Sensory: No sensory deficit.     Coordination: Coordination normal.     Deep Tendon Reflexes: Strength normal.  Psychiatric:        Mood and Affect: Mood is anxious.        Speech: Speech normal.        Behavior: Behavior normal.        Thought Content: Thought content normal.     ED Results / Procedures / Treatments   Labs (all labs ordered are listed, but only abnormal results are displayed) Labs Reviewed  CBC WITH DIFFERENTIAL/PLATELET - Abnormal; Notable for the following components:      Result Value   WBC 15.1 (*)    Neutro Abs 12.9 (*)    All other components within normal limits  COMPREHENSIVE METABOLIC PANEL - Abnormal; Notable for the following components:   Glucose, Bld 128 (*)    Total Protein 9.1 (*)    Alkaline Phosphatase 137 (*)    All other components within normal limits  LIPASE, BLOOD  URINALYSIS, ROUTINE W REFLEX MICROSCOPIC  RAPID URINE DRUG SCREEN, HOSP PERFORMED  TROPONIN I (HIGH SENSITIVITY)  TROPONIN I (HIGH SENSITIVITY)    EKG EKG Interpretation  Date/Time:  Saturday August 25 2020 23:34:58 EST Ventricular Rate:  111 PR Interval:    QRS Duration: 96 QT Interval:  326 QTC Calculation: 429 R Axis:   72 Text Interpretation: Sinus tachycardia Paired ventricular premature complexes Low voltage, extremity and precordial leads Nonspecific T abnormalities, lateral leads Confirmed by Gilda Crease [phone]) on 08/25/2020 11:46:03 PM   Radiology DG Chest Port 1 View  Result Date: 08/26/2020 CLINICAL DATA:  Chest pain over the last hour. EXAM: PORTABLE CHEST 1 VIEW COMPARISON:  08/31/2019 FINDINGS: Poor inspiration. Heart size is normal. Mediastinal shadows are normal. Markings at the lung bases appear prominent. This could be secondary to the poor inspiration, or there could be some actual basilar atelectasis or early infiltrate. Upper lungs are clear. No evidence of heart failure or effusion. IMPRESSION: Poor inspiration. Prominent markings at the lung bases that could be secondary to the poor inspiration, or there could be some actual atelectasis or early infiltrate. Electronically Signed   By: Paulina Fusi M.D.   On: 08/26/2020 00:43   CT ANGIO CHEST/ABD/PEL FOR DISSECTION W &/OR WO CONTRAST  Result Date: 08/26/2020 CLINICAL DATA:  Chest pain, abdominal pain EXAM: CT ANGIOGRAPHY CHEST, ABDOMEN AND PELVIS TECHNIQUE: Non-contrast CT of the chest was initially obtained. Multidetector CT imaging through the chest, abdomen and pelvis was performed using the standard protocol during bolus administration of intravenous contrast. Multiplanar reconstructed images and MIPs were obtained and reviewed to evaluate the vascular anatomy. CONTRAST:  OMNIPAQUE IOHEXOL 350 MG/ML SOLN COMPARISON:  04/06/2017 FINDINGS: CTA CHEST FINDINGS Cardiovascular: Heart is normal size. Aorta is normal caliber. No aortic dissection. Mediastinum/Nodes: No mediastinal, hilar,  or axillary adenopathy. Trachea and esophagus are unremarkable. Thyroid unremarkable. Lungs/Pleura: Linear atelectasis in the  lung bases.  No effusions. Musculoskeletal: Chest wall soft tissues are unremarkable. No acute bony abnormality. Review of the MIP images confirms the above findings. CTA ABDOMEN AND PELVIS FINDINGS VASCULAR Aorta: Normal caliber aorta without aneurysm, dissection, vasculitis or significant stenosis. Celiac: Patent without evidence of aneurysm, dissection, vasculitis or significant stenosis. SMA: Patent without evidence of aneurysm, dissection, vasculitis or significant stenosis. Renals: Both renal arteries are patent without evidence of aneurysm, dissection, vasculitis, fibromuscular dysplasia or significant stenosis. IMA: Patent without evidence of aneurysm, dissection, vasculitis or significant stenosis. Inflow: Patent without evidence of aneurysm, dissection, vasculitis or significant stenosis. Veins: No obvious venous abnormality within the limitations of this arterial phase study. Review of the MIP images confirms the above findings. NON-VASCULAR Hepatobiliary: No focal liver abnormality is seen. Status post cholecystectomy. No biliary dilatation. Pancreas: No focal abnormality or ductal dilatation. Spleen: No focal abnormality.  Normal size. Adrenals/Urinary Tract: No adrenal abnormality. No focal renal abnormality. No stones or hydronephrosis. Urinary bladder is unremarkable. Stomach/Bowel: Fluid-filled small bowel and right colon may reflect a diarrheal process. Moderate fluid in the stomach. No evidence of bowel obstruction. Lymphatic: No evidence of aneurysm or adenopathy. Reproductive: Uterus and adnexa unremarkable.  No mass. Other: No free fluid or free air. Musculoskeletal: No acute bony abnormality. Postoperative changes in the lower lumbar spine and across the right SI joint. Review of the MIP images confirms the above findings. IMPRESSION: No evidence of aortic aneurysm or dissection. Bibasilar atelectasis. Fluid throughout the small bowel and in the right colon could reflect diarrhea. No evidence  of bowel obstruction. Electronically Signed   By: Charlett Nose M.D.   On: 08/26/2020 03:27    Procedures Procedures   Medications Ordered in ED Medications  ondansetron (ZOFRAN) injection 4 mg (4 mg Intravenous Given 08/25/20 2353)  LORazepam (ATIVAN) injection 1 mg (1 mg Intravenous Given 08/25/20 2354)  alum & mag hydroxide-simeth (MAALOX/MYLANTA) 200-200-20 MG/5ML suspension 30 mL (30 mLs Oral Given 08/25/20 2354)    And  lidocaine (XYLOCAINE) 2 % viscous mouth solution 15 mL (15 mLs Oral Given 08/25/20 2354)  iohexol (OMNIPAQUE) 350 MG/ML injection 100 mL (100 mLs Intravenous Contrast Given 08/26/20 0308)  sodium chloride 0.9 % bolus 1,000 mL (1,000 mLs Intravenous New Bag/Given 08/26/20 0351)  fentaNYL (SUBLIMAZE) injection 100 mcg (100 mcg Intravenous Given 08/26/20 0351)  sodium chloride 0.9 % bolus 1,000 mL (1,000 mLs Intravenous New Bag/Given 08/26/20 0457)    ED Course  I have reviewed the triage vital signs and the nursing notes.  Pertinent labs & imaging results that were available during my care of the patient were reviewed by me and considered in my medical decision making (see chart for details).    MDM Rules/Calculators/A&P                          Patient presents to the emergency department for evaluation of chest pain.  Patient reports that she had onset of nausea and vomiting earlier today.  After she vomited she developed pain in the center portion of her lower chest and upper abdomen that radiates through to her back.  Symptoms seem atypical for cardiac etiology.  Cardiac evaluation is negative.  CT angio chest abdomen and pelvis is negative.  No evidence of PE, no evidence of dissection, no evidence of esophageal disruption.  Patient with tachycardia upon arrival.  This is felt  to be multifactorial secondary to dehydration, anxiety and pain.  She was treated with fluids and analgesia with improvement.  She did become mildly hypoxic with narcotic analgesia, temporarily was on  supplemental oxygen but weaned off.  Patient does have a history of gastroesophageal reflux.  Will initiate proton pump inhibitor.  Lungs are clear on CT and auscultation, doubt aspiration, will give return precautions.  Will check COVID prior to discharge to determine if Covid infection is causing her nausea, vomiting and mild sinus tachycardia.  Final Clinical Impression(s) / ED Diagnoses Final diagnoses:  Atypical chest pain  Non-intractable vomiting with nausea, unspecified vomiting type  Epigastric pain    Rx / DC Orders ED Discharge Orders         Ordered    pantoprazole (PROTONIX) 40 MG tablet  Daily        08/26/20 0609    ondansetron (ZOFRAN) 4 MG tablet  Every 6 hours PRN        08/26/20 0609           Gilda CreasePollina, Kayshawn Ozburn J, MD 08/26/20 [phone]

## 2020-08-25 NOTE — ED Triage Notes (Signed)
Pt c/o central chest pain for the past hour. States pain started after having an episode of vomiting.

## 2020-08-26 ENCOUNTER — Emergency Department (HOSPITAL_COMMUNITY): Payer: 59

## 2020-08-26 LAB — COMPREHENSIVE METABOLIC PANEL
ALT: 20 U/L (ref 0–44)
AST: 29 U/L (ref 15–41)
Albumin: 4.6 g/dL (ref 3.5–5.0)
Alkaline Phosphatase: 137 U/L — ABNORMAL HIGH (ref 38–126)
Anion gap: 10 (ref 5–15)
BUN: 18 mg/dL (ref 6–20)
CO2: 22 mmol/L (ref 22–32)
Calcium: 9.8 mg/dL (ref 8.9–10.3)
Chloride: 105 mmol/L (ref 98–111)
Creatinine, Ser: 0.85 mg/dL (ref 0.44–1.00)
GFR, Estimated: >60 mL/min (ref >60)
Glucose, Bld: 128 mg/dL — ABNORMAL HIGH (ref 70–99)
Potassium: 3.7 mmol/L (ref 3.5–5.1)
Sodium: 137 mmol/L (ref 135–145)
Total Bilirubin: 0.8 mg/dL (ref 0.3–1.2)
Total Protein: 9.1 g/dL — ABNORMAL HIGH (ref 6.5–8.1)

## 2020-08-26 LAB — RAPID URINE DRUG SCREEN, HOSP PERFORMED
Amphetamines: NOT DETECTED
Barbiturates: NOT DETECTED
Benzodiazepines: NOT DETECTED
Cocaine: NOT DETECTED
Opiates: NOT DETECTED
Tetrahydrocannabinol: NOT DETECTED

## 2020-08-26 LAB — CBC WITH DIFFERENTIAL/PLATELET
Abs Immature Granulocytes: 0.07 10*3/uL (ref 0.00–0.07)
Basophils Absolute: 0 10*3/uL (ref 0.0–0.1)
Basophils Relative: 0 %
Eosinophils Absolute: 0 10*3/uL (ref 0.0–0.5)
Eosinophils Relative: 0 %
HCT: 42 % (ref 36.0–46.0)
Hemoglobin: 13.7 g/dL (ref 12.0–15.0)
Immature Granulocytes: 1 %
Lymphocytes Relative: 8 %
Lymphs Abs: 1.2 10*3/uL (ref 0.7–4.0)
MCH: 30.2 pg (ref 26.0–34.0)
MCHC: 32.6 g/dL (ref 30.0–36.0)
MCV: 92.7 fL (ref 80.0–100.0)
Monocytes Absolute: 0.9 10*3/uL (ref 0.1–1.0)
Monocytes Relative: 6 %
Neutro Abs: 12.9 10*3/uL — ABNORMAL HIGH (ref 1.7–7.7)
Neutrophils Relative %: 85 %
Platelets: 277 10*3/uL (ref 150–400)
RBC: 4.53 MIL/uL (ref 3.87–5.11)
RDW: 12.8 % (ref 11.5–15.5)
WBC: 15.1 10*3/uL — ABNORMAL HIGH (ref 4.0–10.5)
nRBC: 0 % (ref 0.0–0.2)

## 2020-08-26 LAB — URINALYSIS, ROUTINE W REFLEX MICROSCOPIC
Bilirubin Urine: NEGATIVE
Glucose, UA: NEGATIVE mg/dL
Hgb urine dipstick: NEGATIVE
Ketones, ur: NEGATIVE mg/dL
Nitrite: POSITIVE — AB
Protein, ur: NEGATIVE mg/dL
Specific Gravity, Urine: >1.046 — ABNORMAL HIGH (ref 1.005–1.030)
pH: 6 (ref 5.0–8.0)

## 2020-08-26 LAB — TROPONIN I (HIGH SENSITIVITY)
Troponin I (High Sensitivity): 3 ng/L (ref <18)
Troponin I (High Sensitivity): 3 ng/L (ref <18)

## 2020-08-26 LAB — SARS CORONAVIRUS 2 (TAT 6-24 HRS): SARS Coronavirus 2: NEGATIVE

## 2020-08-26 LAB — LIPASE, BLOOD: Lipase: 35 U/L (ref 11–51)

## 2020-08-26 MED ORDER — SODIUM CHLORIDE 0.9 % IV BOLUS
1000.0000 mL | Freq: Once | INTRAVENOUS | Status: AC
  Administered 2020-08-26: 1000 mL via INTRAVENOUS

## 2020-08-26 MED ORDER — KETOROLAC TROMETHAMINE 30 MG/ML IJ SOLN
30.0000 mg | Freq: Once | INTRAMUSCULAR | Status: AC
  Administered 2020-08-26: 30 mg via INTRAVENOUS
  Filled 2020-08-26: qty 1

## 2020-08-26 MED ORDER — ONDANSETRON HCL 4 MG PO TABS: 4.0000 mg | ORAL_TABLET | Freq: Four times a day (QID) | ORAL | 0 refills | Status: DC | PRN

## 2020-08-26 MED ORDER — FAMOTIDINE 20 MG PO TABS
40.0000 mg | ORAL_TABLET | Freq: Once | ORAL | Status: AC
  Administered 2020-08-26: 40 mg via ORAL
  Filled 2020-08-26: qty 2

## 2020-08-26 MED ORDER — IOHEXOL 350 MG/ML SOLN
100.0000 mL | Freq: Once | INTRAVENOUS | Status: AC | PRN
  Administered 2020-08-26: 100 mL via INTRAVENOUS

## 2020-08-26 MED ORDER — FENTANYL CITRATE (PF) 100 MCG/2ML IJ SOLN
100.0000 ug | Freq: Once | INTRAMUSCULAR | Status: AC
  Administered 2020-08-26: 100 ug via INTRAVENOUS
  Filled 2020-08-26: qty 2

## 2020-08-26 MED ORDER — PANTOPRAZOLE SODIUM 40 MG PO TBEC: 40.0000 mg | DELAYED_RELEASE_TABLET | Freq: Every day | ORAL | 3 refills | Status: DC

## 2020-08-26 NOTE — ED Notes (Signed)
Pt reports being at a birthday party earlier in evening and eating ice cream. Pt also reports eating jalapenos with her breakfast earlier in the day. Pt reports being unaware of having a Hx of GERD. Pt does not report taking medication for this.

## 2020-08-27 NOTE — Patient Instructions (Addendum)
° °  Your last physical was 09/2018 -- you should schedule a physical exam.       Medications changes include :   Continue the protonix.  Take the medrol dose pak.  Take the sucralfate.  Your prescription(s) have been submitted to your pharmacy. Please take as directed and contact our office if you believe you are having problem(s) with the medication(s).     Update me tomorrow with your symptoms.

## 2020-08-27 NOTE — Progress Notes (Signed)
Subjective:    Patient ID: Melissa Kirby, female    DOB: 05-01-1961, 60 y.o.   MRN: [id number]  HPI The patient is here for follow up from the hospital   ED 2/5 for chest pain.  She was at her grandson's birthday party.  She had sudden onset of N/V one hour prior to going to ED. it was violent.  After vomiting she had severe central chest pain that radiated though to the back.  No hematemesis.  No rectal bleed/melena.    She had chest wall tenderness in her lower sternum and left side of her chest.  She had abdominal tenderness in the epigastric region. She was tachycardic.     EKG - sinus tachy at 111, pared PVCs, nonspecific T abn.  Ct angio chest, abdomen, pelvis negative. No evidence of PE, dissection or esophageal disruption. Ua, blood work unremarkable.    Symptoms thought to be multifactorial due to dehydration, anxiety and pain.  After fluids, analgesia she improved.  She had mild hypoxia with narcotic analgesia - temporarily on oxygen.     She was started on a PPI - protonix 40 mg daily and zofran 4 mg prn   She received zofran, ativan, maalox/mylanta, lidocaine, IVF, fentanyl.  She is still not feeling well.  She has not been able to return to work. She is having daily headaches.  She has taken aleve, tylenol and ibuprofen.  It is not a migraine.  Her chest is still sore to touch and it hurts straight through to her back. She has is hungry.  When she eats it hurts in her esophagus.  It makes her feel like she will throw up.  She can eat shaved ice.  She can drinks fluids.  She can not eat applesauce or anything solid-it hurts too much.      she still has diarrhea.  She has diarrhea about 4-5 times a day.  It is just water.  She overall feels weak and tired.  Medications and allergies reviewed with patient and updated if appropriate.  Patient Active Problem List   Diagnosis Date Noted  . Asthma exacerbation, mild 06/19/2020  . Conjunctivitis 03/13/2020  . Bite, insect  11/29/2019  . Tick bite of neck 11/29/2019  . Generalized body aches 11/29/2019  . Hypertension 08/09/2019  . COVID-19 07/04/2019  . History of CMV 09/20/2018  . Lumbar radiculopathy 05/20/2018  . Lumbar back pain with radiculopathy affecting right lower extremity 03/03/2018  . Insomnia 12/25/2017  . Acute sinus infection 09/18/2017  . Osteopenia 08/19/2017  . Gluten intolerance 08/12/2017  . Exercise-induced asthma 08/12/2017  . SOB (shortness of breath) 09/02/2016  . Intracranial hypotension 01/24/2014  . Vitamin D deficiency   . Abnormal brain MRI 10/15/2013  . LBP (low back pain) 05/10/2012  . Fibromyalgia   . Fatigue 01/17/2011  . Atrophic vaginitis 10/17/2010  . Vitamin B12 deficiency (non anemic) 10/04/2009  . Hypothyroidism 10/03/2009  . Migraine headache 10/03/2009    Current Outpatient Medications on File Prior to Visit  Medication Sig Dispense Refill  . levothyroxine (SYNTHROID) 125 MCG tablet TAKE 1 TABLET BY MOUTH EVERY DAY 90 tablet 3  . nortriptyline (PAMELOR) 50 MG capsule TAKE 2 CAPSULES (100 MG TOTAL) BY MOUTH AT BEDTIME. 180 capsule 1  . albuterol (VENTOLIN HFA) 108 (90 Base) MCG/ACT inhaler Inhale 2 puffs into the lungs every 6 (six) hours as needed for wheezing or shortness of breath. (Patient not taking: Reported on 08/28/2020) 6.7 g 5  .  cyanocobalamin 2000 MCG tablet Take 2,000 mcg by mouth daily. (Patient not taking: Reported on 08/28/2020)    . Multiple Vitamin (MULTIVITAMIN) capsule Take 1 capsule by mouth daily. (Patient not taking: Reported on 08/28/2020)    . ondansetron (ZOFRAN) 4 MG tablet Take 1 tablet (4 mg total) by mouth every 6 (six) hours as needed for nausea or vomiting. (Patient not taking: Reported on 08/28/2020) 20 tablet 0  . pantoprazole (PROTONIX) 40 MG tablet Take 1 tablet (40 mg total) by mouth daily. (Patient not taking: Reported on 08/28/2020) 30 tablet 3   No current facility-administered medications on file prior to visit.    Past  Medical History:  Diagnosis Date  . ANEMIA, IRON DEFICIENCY   . Cerebrospinal fluid leak from spinal puncture    improved with 7th patch at [facility] 05/2014  . Depression    therapy  . Fibromyalgia 1999   no meds  . GASTRITIS 11/12/2009   Qualifier: Diagnosis of  By: Theadora Rama RN, Silvio Pate    . GERD (gastroesophageal reflux disease)    per endo. - does not take any meds.    . Kidney stones 1998, 2005   x 2  . MIGRAINE HEADACHE    last Migraine 06/29/2011-   . Unspecified hypothyroidism 1982/2000   surgical resection of benign growth - partial then complete  . VITAMIN B12 DEFICIENCY 09/2009 dx    Past Surgical History:  Procedure Laterality Date  . CHOLECYSTECTOMY  2009  . DIAGNOSTIC LAPAROSCOPY  1982   diagnostic  . POSTERIOR FUSION LUMBAR SPINE  06/2011  . THYROID SURGERY  1983   partial   . TOTAL THYROIDECTOMY  2000   Had thyroid nodule, nonmalignant ([id number]) resection  . TUBAL LIGATION  1989    Social History   Socioeconomic History  . Marital status: Divorced    Spouse name: Not on file  . Number of children: 3  . Years of education: Not on file  . Highest education level: Not on file  Occupational History  . Not on file  Tobacco Use  . Smoking status: Never Smoker  . Smokeless tobacco: Never Used  Vaping Use  . Vaping Use: Never used  Substance and Sexual Activity  . Alcohol use: No    Alcohol/week: 0.0 standard drinks  . Drug use: No  . Sexual activity: Yes    Birth control/protection: Surgical    Comment: Married lives with spouse, 3 children. 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 Not on file  Social History Narrative   Ongoing divorce from husband, separated since 05/2014. Lives alone     Works at [employer] and [employer].  Education: high school 12th grade.      Exercises regularly   Social Determinants of Health   Financial Resource Strain: Not on file  Food Insecurity: Not on file  Transportation Needs: Not on file   Physical Activity: Not on file  Stress: Not on file  Social Connections: Not on file    Family History  Problem Relation Age of Onset  . Hyperlipidemia Mother   . Heart disease Mother 37       massive MI  . Hyperlipidemia Father   . COPD Father        severe, smoker  . Breast cancer Maternal Grandmother   . Hyperlipidemia Maternal Grandmother   . Coronary artery disease Maternal Grandmother 78       CABG  . Ovarian cancer Other   . Anesthesia  problems Neg Hx   . Malignant hyperthermia Neg Hx   . Pseudochol deficiency Neg Hx   . Hypotension Neg Hx     Review of Systems  Constitutional: Positive for fatigue (weak). Negative for fever.  HENT: Negative for congestion and sore throat.   Respiratory: Negative for shortness of breath.   Cardiovascular: Positive for chest pain.  Gastrointestinal: Positive for diarrhea. Negative for abdominal pain, blood in stool, nausea and vomiting (not since ED).  Genitourinary: Negative for dysuria and hematuria.  Neurological: Positive for headaches. Negative for light-headedness.       Objective:   Vitals:   08/28/20 0929  BP: 120/84  Pulse: 95  Temp: 98 F (36.7 C)  SpO2: 97%   BP Readings from Last 3 Encounters:  08/28/20 120/84  08/26/20 126/72  12/20/19 122/76   Wt Readings from Last 3 Encounters:  08/28/20 138 lb 12.8 oz (63 kg)  08/25/20 142 lb (64.4 kg)  12/20/19 137 lb 3.2 oz (62.2 kg)   Body mass index is 24.59 kg/m.   Physical Exam    Constitutional: Appears well-developed but mildly ill.  No distress.  HENT:  Head: Normocephalic and atraumatic.  Neck: Neck supple. No tracheal deviation present. No thyromegaly present.  No cervical lymphadenopathy Cardiovascular: Normal rate, regular rhythm and normal heart sounds.  No murmur heard. .  No edema Pulmonary/Chest: Left mid chest tender to very light palpation.  No sternal tenderness.  Effort normal and breath sounds normal. No respiratory distress. No has no  wheezes. No rales. Abdomen: Soft, nontender, nondistended Skin: Skin is warm and dry. Not diaphoretic.  Psychiatric: Normal mood and affect. Behavior is normal.   CT ANGIO CHEST/ABD/PEL FOR DISSECTION W &/OR WO CONTRAST CLINICAL DATA:  Chest pain, abdominal pain  EXAM: CT ANGIOGRAPHY CHEST, ABDOMEN AND PELVIS  TECHNIQUE: Non-contrast CT of the chest was initially obtained.  Multidetector CT imaging through the chest, abdomen and pelvis was performed using the standard protocol during bolus administration of intravenous contrast. Multiplanar reconstructed images and MIPs were obtained and reviewed to evaluate the vascular anatomy.  CONTRAST:  OMNIPAQUE IOHEXOL 350 MG/ML SOLN  COMPARISON:  04/06/2017  FINDINGS: CTA CHEST FINDINGS  Cardiovascular: Heart is normal size. Aorta is normal caliber. No aortic dissection.  Mediastinum/Nodes: No mediastinal, hilar, or axillary adenopathy. Trachea and esophagus are unremarkable. Thyroid unremarkable.  Lungs/Pleura: Linear atelectasis in the lung bases.  No effusions.  Musculoskeletal: Chest wall soft tissues are unremarkable. No acute bony abnormality.  Review of the MIP images confirms the above findings.  CTA ABDOMEN AND PELVIS FINDINGS  VASCULAR  Aorta: Normal caliber aorta without aneurysm, dissection, vasculitis or significant stenosis.  Celiac: Patent without evidence of aneurysm, dissection, vasculitis or significant stenosis.  SMA: Patent without evidence of aneurysm, dissection, vasculitis or significant stenosis.  Renals: Both renal arteries are patent without evidence of aneurysm, dissection, vasculitis, fibromuscular dysplasia or significant stenosis.  IMA: Patent without evidence of aneurysm, dissection, vasculitis or significant stenosis.  Inflow: Patent without evidence of aneurysm, dissection, vasculitis or significant stenosis.  Veins: No obvious venous abnormality within the limitations of  this arterial phase study.  Review of the MIP images confirms the above findings.  NON-VASCULAR  Hepatobiliary: No focal liver abnormality is seen. Status post cholecystectomy. No biliary dilatation.  Pancreas: No focal abnormality or ductal dilatation.  Spleen: No focal abnormality.  Normal size.  Adrenals/Urinary Tract: No adrenal abnormality. No focal renal abnormality. No stones or hydronephrosis. Urinary bladder is unremarkable.  Stomach/Bowel: Fluid-filled  small bowel and right colon may reflect a diarrheal process. Moderate fluid in the stomach. No evidence of bowel obstruction.  Lymphatic: No evidence of aneurysm or adenopathy.  Reproductive: Uterus and adnexa unremarkable.  No mass.  Other: No free fluid or free air.  Musculoskeletal: No acute bony abnormality. Postoperative changes in the lower lumbar spine and across the right SI joint.  Review of the MIP images confirms the above findings.  IMPRESSION: No evidence of aortic aneurysm or dissection.  Bibasilar atelectasis.  Fluid throughout the small bowel and in the right colon could reflect diarrhea. No evidence of bowel obstruction.  Electronically Signed   By: Charlett Nose M.D.   On: 08/26/2020 03:27 DG Chest Port 1 View CLINICAL DATA:  Chest pain over the last hour.  EXAM: PORTABLE CHEST 1 VIEW  COMPARISON:  08/31/2019  FINDINGS: Poor inspiration. Heart size is normal. Mediastinal shadows are normal. Markings at the lung bases appear prominent. This could be secondary to the poor inspiration, or there could be some actual basilar atelectasis or early infiltrate. Upper lungs are clear. No evidence of heart failure or effusion.  IMPRESSION: Poor inspiration. Prominent markings at the lung bases that could be secondary to the poor inspiration, or there could be some actual atelectasis or early infiltrate.  Electronically Signed   By: Paulina Fusi M.D.   On: 08/26/2020 00:43    Lab  Results  Component Value Date   WBC 15.1 (H) 08/25/2020   HGB 13.7 08/25/2020   HCT 42.0 08/25/2020   PLT 277 08/25/2020   GLUCOSE 128 (H) 08/25/2020   CHOL 207 (H) 09/20/2018   TRIG 121.0 09/20/2018   HDL 53.90 09/20/2018   LDLDIRECT 145.1 06/29/2013   LDLCALC 129 (H) 09/20/2018   ALT 20 08/25/2020   AST 29 08/25/2020   NA 137 08/25/2020   K 3.7 08/25/2020   CL 105 08/25/2020   CREATININE 0.85 08/25/2020   BUN 18 08/25/2020   CO2 22 08/25/2020   TSH 1.03 08/31/2019   INR 1.02 07/04/2011      Assessment & Plan:    See Problem List for Assessment and Plan of chronic medical problems.    This visit occurred during the SARS-CoV-2 public health emergency.  Safety protocols were in place, including screening questions prior to the visit, additional usage of staff PPE, and extensive cleaning of exam room while observing appropriate contact time as indicated for disinfecting solutions.

## 2020-08-28 ENCOUNTER — Ambulatory Visit: Payer: 59 | Admitting: Internal Medicine

## 2020-08-28 ENCOUNTER — Other Ambulatory Visit: Payer: Self-pay

## 2020-08-28 ENCOUNTER — Encounter: Payer: Self-pay | Admitting: Internal Medicine

## 2020-08-28 DIAGNOSIS — R519 Headache, unspecified: Secondary | ICD-10-CM

## 2020-08-28 DIAGNOSIS — R079 Chest pain, unspecified: Secondary | ICD-10-CM | POA: Diagnosis not present

## 2020-08-28 DIAGNOSIS — K529 Noninfective gastroenteritis and colitis, unspecified: Secondary | ICD-10-CM

## 2020-08-28 DIAGNOSIS — K2100 Gastro-esophageal reflux disease with esophagitis, without bleeding: Secondary | ICD-10-CM | POA: Diagnosis not present

## 2020-08-28 MED ORDER — METHYLPREDNISOLONE 4 MG PO TBPK: ORAL_TABLET | ORAL | 0 refills | Status: DC

## 2020-08-28 MED ORDER — SUCRALFATE 1 GM/10ML PO SUSP: 1.0000 g | Freq: Three times a day (TID) | ORAL | 0 refills | Status: DC

## 2020-08-28 NOTE — Assessment & Plan Note (Signed)
Acute Started after violent vomiting Possibly some esophagitis or irritation from the vomiting-imaging did not reveal anything concerning She also has some tenderness in her left chest with light palpation so some of this could be musculoskeletal as well Will be given Medrol Dosepak for intractable headache, which may help musculoskeletal pain and hopefully not irritate her stomach or esophagus

## 2020-08-28 NOTE — Assessment & Plan Note (Signed)
Acute Her symptoms are consistent with esophagitis-she has pain with swallowing, especially solid foods Started after violent vomiting No evidence of true gastritis Imaging did not reveal any concern for Mallory-Weiss tear or other complications from vomiting No hematemesis or melena Continue pantoprazole 40 mg daily until all of her symptoms have resolved and then she will taper off Will start sulfur fate to see if that helps-1 g 3 times daily with meals and prior to bedtime Bland diet-increase as tolerated

## 2020-08-28 NOTE — Assessment & Plan Note (Signed)
Acute Symptoms suggestive of viral gastroenteritis No longer nauseous or vomiting, but still having diarrhea Diarrhea is not being helped by the fact that she is not eating anything and she will try to slowly increase her food No need to take Zofran since she is not nauseous We will continue pantoprazole possible esophagitis related to vomiting

## 2020-08-28 NOTE — Assessment & Plan Note (Signed)
Acute Likely multifactorial Likely related to gastroenteritis, not eating, possible dehydration  Medrol Dosepak since over-the-counter medications are not helping

## 2020-08-30 ENCOUNTER — Encounter: Payer: Self-pay | Admitting: Internal Medicine

## 2020-09-04 ENCOUNTER — Telehealth: Payer: Self-pay | Admitting: Internal Medicine

## 2020-09-04 NOTE — Telephone Encounter (Signed)
Patient dropped of FMLA. Out of work 08/27/20 to 09/02/20. LOV: 08/28/2020

## 2020-09-05 NOTE — Telephone Encounter (Signed)
Forms have been completed &Placed in providers box to review and sign.   Patient aware provider is out of office until 09/10/20.

## 2020-09-10 DIAGNOSIS — Z0279 Encounter for issue of other medical certificate: Secondary | ICD-10-CM

## 2020-09-10 NOTE — Telephone Encounter (Signed)
Forms have been signed, Faxed to [employer] @[phone] , Copy sent to scan &Charged for.   LVM to inform patient and Original mailed to patient for her records.

## 2020-09-28 ENCOUNTER — Other Ambulatory Visit: Payer: Self-pay | Admitting: Internal Medicine

## 2020-10-28 ENCOUNTER — Other Ambulatory Visit: Payer: Self-pay | Admitting: Internal Medicine

## 2021-01-25 ENCOUNTER — Other Ambulatory Visit: Payer: Self-pay | Admitting: Internal Medicine

## 2021-02-27 ENCOUNTER — Telehealth (INDEPENDENT_AMBULATORY_CARE_PROVIDER_SITE_OTHER): Payer: 59 | Admitting: Internal Medicine

## 2021-02-27 ENCOUNTER — Encounter: Payer: Self-pay | Admitting: Internal Medicine

## 2021-02-27 DIAGNOSIS — J209 Acute bronchitis, unspecified: Secondary | ICD-10-CM | POA: Diagnosis not present

## 2021-02-27 DIAGNOSIS — J45901 Unspecified asthma with (acute) exacerbation: Secondary | ICD-10-CM | POA: Diagnosis not present

## 2021-02-27 MED ORDER — HYDROCOD POLST-CPM POLST ER 10-8 MG/5ML PO SUER: 5.0000 mL | Freq: Two times a day (BID) | ORAL | 0 refills | Status: DC | PRN

## 2021-02-27 MED ORDER — CEFDINIR 300 MG PO CAPS: 300.0000 mg | ORAL_CAPSULE | Freq: Two times a day (BID) | ORAL | 0 refills | Status: DC

## 2021-02-27 NOTE — Assessment & Plan Note (Signed)
Acute Symptoms consistent with bacterial bronchitis with asthma exacerbation Already on prednisone, which she will continue and complete Continue albuterol inhaler as needed We will start antibiotics-Omnicef 300 mg twice daily x10 days Continue allergy medication, over-the-counter cold medications Chart Tussionex syrup 5 mls every 12 hours as needed Rest, fluids Note given for work She will call if there is no improvement / if her symptoms worsen or with any other concerns

## 2021-02-27 NOTE — Progress Notes (Signed)
Virtual Visit via Video Note  I connected with Melissa Kirby on 02/27/21 at  9:10 AM EDT by a video enabled telemedicine application and verified that I am speaking with the correct person using two identifiers.   I discussed the limitations of evaluation and management by telemedicine and the availability of in person appointments. The patient expressed understanding and agreed to proceed.  Present for the visit:  Myself, Dr Cheryll Cockayne, Suezanne Jacquet.  The patient is currently at home and I am in the office.    No referring provider.    History of Present Illness: This is an acute visit for cold symptoms.  Sunday night, 3 nights ago, she started having fever, ST.  She had a covid test at urgent care that was negative.  Her symptoms continued and she developed congestion, postnasal drainage, cough that is productive of some yellow sputum, mild shortness of breath and headaches.  When she went to urgent care she was given a prescription for prednisone and advised to start it if she needed to because of her history of asthma.  She has started that.  She is also been taking Allegra-D and Tylenol.  The infection has moved up from her head down to her chest and she does not feel like what she is doing is helping.     Review of Systems  Constitutional:  Positive for fever.  HENT:  Positive for congestion and sore throat (from PND). Negative for sinus pain.   Respiratory:  Positive for cough, sputum production (yellow) and shortness of breath (a little). Negative for wheezing.   Gastrointestinal:  Negative for diarrhea and nausea.  Musculoskeletal:  Negative for myalgias.  Neurological:  Positive for headaches.     Social History   Socioeconomic History   Marital status: Divorced    Spouse name: Not on file   Number of children: 3   Years of education: Not on file   Highest education level: Not on file  Occupational History   Not on file  Tobacco Use   Smoking status: Never    Smokeless tobacco: Never  Vaping Use   Vaping Use: Never used  Substance and Sexual Activity   Alcohol use: No    Alcohol/week: 0.0 standard drinks   Drug use: No   Sexual activity: Yes    Birth control/protection: Surgical    Comment: Married lives with spouse, 3 children. 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 Lives alone     Works at [employer] and [employer].  Education: high school 12th grade.      Exercises regularly   Social Determinants of Health   Financial Resource Strain: Not on file  Food Insecurity: Not on file  Transportation Needs: Not on file  Physical Activity: Not on file  Stress: Not on file  Social Connections: Not on file     Observations/Objective: Appears well in NAD Sounds congested and slightly hoarse Breathing normally   Assessment and Plan:  See Problem List for Assessment and Plan of chronic medical problems.   Follow Up Instructions:    I discussed the assessment and treatment plan with the patient. The patient was provided an opportunity to ask questions and all were answered. The patient agreed with the plan and demonstrated an understanding of the instructions.   The patient was advised to call back or seek an in-person  evaluation if the symptoms worsen or if the condition fails to improve as anticipated.    Binnie Rail, MD

## 2021-02-27 NOTE — Assessment & Plan Note (Signed)
Acute Secondary to upper respiratory infection Already on prednisone from urgent care, which she will continue and complete Continue using albuterol inhaler as needed No significant shortness of breath, wheezing or chest tightness We will treat infection with an antibiotic She will call if her symptoms worsen or do not improve

## 2021-04-01 DIAGNOSIS — R7303 Prediabetes: Secondary | ICD-10-CM | POA: Insufficient documentation

## 2021-04-01 DIAGNOSIS — R739 Hyperglycemia, unspecified: Secondary | ICD-10-CM | POA: Insufficient documentation

## 2021-04-01 NOTE — Patient Instructions (Addendum)
Blood work was ordered.     Medications changes include :   none   A referral was ordered for Dr Everlena Cooper and GI.     Please followup in 1 year    Health Maintenance, Female Adopting a healthy lifestyle and getting preventive care are important in promoting health and wellness. Ask your health care provider about: The right schedule for you to have regular tests and exams. Things you can do on your own to prevent diseases and keep yourself healthy. What should I know about diet, weight, and exercise? Eat a healthy diet  Eat a diet that includes plenty of vegetables, fruits, low-fat dairy products, and lean protein. Do not eat a lot of foods that are high in solid fats, added sugars, or sodium. Maintain a healthy weight Body mass index (BMI) is used to identify weight problems. It estimates body fat based on height and weight. Your health care provider can help determine your BMI and help you achieve or maintain a healthy weight. Get regular exercise Get regular exercise. This is one of the most important things you can do for your health. Most adults should: Exercise for at least 150 minutes each week. The exercise should increase your heart rate and make you sweat (moderate-intensity exercise). Do strengthening exercises at least twice a week. This is in addition to the moderate-intensity exercise. Spend less time sitting. Even light physical activity can be beneficial. Watch cholesterol and blood lipids Have your blood tested for lipids and cholesterol at 59 years of age, then have this test every 5 years. Have your cholesterol levels checked more often if: Your lipid or cholesterol levels are high. You are older than 59 years of age. You are at high risk for heart disease. What should I know about cancer screening? Depending on your health history and family history, you may need to have cancer screening at various ages. This may include screening for: Breast cancer. Cervical  cancer. Colorectal cancer. Skin cancer. Lung cancer. What should I know about heart disease, diabetes, and high blood pressure? Blood pressure and heart disease High blood pressure causes heart disease and increases the risk of stroke. This is more likely to develop in people who have high blood pressure readings, are of African descent, or are overweight. Have your blood pressure checked: Every 3-5 years if you are 32-5 years of age. Every year if you are 35 years old or older. Diabetes Have regular diabetes screenings. This checks your fasting blood sugar level. Have the screening done: Once every three years after age 14 if you are at a normal weight and have a low risk for diabetes. More often and at a younger age if you are overweight or have a high risk for diabetes. What should I know about preventing infection? Hepatitis B If you have a higher risk for hepatitis B, you should be screened for this virus. Talk with your health care provider to find out if you are at risk for hepatitis B infection. Hepatitis C Testing is recommended for: Everyone born from [birth year] through 1964. Anyone with known risk factors for hepatitis C. Sexually transmitted infections (STIs) Get screened for STIs, including gonorrhea and chlamydia, if: You are sexually active and are younger than 59 years of age. You are older than 59 years of age and your health care provider tells you that you are at risk for this type of infection. Your sexual activity has changed since you were last screened, and you are at increased  risk for chlamydia or gonorrhea. Ask your health care provider if you are at risk. Ask your health care provider about whether you are at high risk for HIV. Your health care provider may recommend a prescription medicine to help prevent HIV infection. If you choose to take medicine to prevent HIV, you should first get tested for HIV. You should then be tested every 3 months for as long as you are  taking the medicine. Pregnancy If you are about to stop having your period (premenopausal) and you may become pregnant, seek counseling before you get pregnant. Take 400 to 800 micrograms (mcg) of folic acid every day if you become pregnant. Ask for birth control (contraception) if you want to prevent pregnancy. Osteoporosis and menopause Osteoporosis is a disease in which the bones lose minerals and strength with aging. This can result in bone fractures. If you are 91 years old or older, or if you are at risk for osteoporosis and fractures, ask your health care provider if you should: Be screened for bone loss. Take a calcium or vitamin D supplement to lower your risk of fractures. Be given hormone replacement therapy (HRT) to treat symptoms of menopause. Follow these instructions at home: Lifestyle Do not use any products that contain nicotine or tobacco, such as cigarettes, e-cigarettes, and chewing tobacco. If you need help quitting, ask your health care provider. Do not use street drugs. Do not share needles. Ask your health care provider for help if you need support or information about quitting drugs. Alcohol use Do not drink alcohol if: Your health care provider tells you not to drink. You are pregnant, may be pregnant, or are planning to become pregnant. If you drink alcohol: Limit how much you use to 0-1 drink a day. Limit intake if you are breastfeeding. Be aware of how much alcohol is in your drink. In the [place] one drink equals one 12 oz bottle of beer (355 mL), one 5 oz glass of wine (148 mL), or one 1 oz glass of hard liquor (44 mL). General instructions Schedule regular health, dental, and eye exams. Stay current with your vaccines. Tell your health care provider if: You often feel depressed. You have ever been abused or do not feel safe at home. Summary Adopting a healthy lifestyle and getting preventive care are important in promoting health and wellness. Follow  your health care provider's instructions about healthy diet, exercising, and getting tested or screened for diseases. Follow your health care provider's instructions on monitoring your cholesterol and blood pressure. This information is not intended to replace advice given to you by your health care provider. Make sure you discuss any questions you have with your health care provider. Document Revised: 09/14/2020 Document Reviewed: 06/30/2018 Elsevier Patient Education  2022 [employer].

## 2021-04-01 NOTE — Progress Notes (Signed)
Subjective:    Patient ID: Melissa Kirby, female    DOB: April 14, 1961, 60 y.o.   MRN: [id number]   This visit occurred during the SARS-CoV-2 public health emergency.  Safety protocols were in place, including screening questions prior to the visit, additional usage of staff PPE, and extensive cleaning of exam room while observing appropriate contact time as indicated for disinfecting solutions.    HPI She is here for a physical exam.   BP is up and down but she thinks it is works.  She has a machine at work.  BP at home is good.  BP at work (stressful there)  127/89, 125/83, 144/87, 130/85, 149/94, 134/80, 156/99.  She thinks this is all stress related.  She is exercising on a regular basis.   Head pain when she stands but goes away with laying down.  She does have a history of intracranial hypotension and was found to have a spontaneous CSF leak and had a patch placed.  She is concerned this may not be working as well given her symptoms.  She has not been compliant with a low gluten diet and she has been having some constipation and other issues because of that and wonders if that could be related to some of her other symptoms as well.  She is trying to be more strict about her diet.   Medications and allergies reviewed with patient and updated if appropriate.  Patient Active Problem List   Diagnosis Date Noted   Hyperglycemia 04/01/2021   Esophagitis, reflux 08/28/2020   Headache 08/28/2020   Asthma exacerbation, mild 06/19/2020   COVID-19 07/04/2019   History of CMV 09/20/2018   Lumbar radiculopathy 05/20/2018   Lumbar back pain with radiculopathy affecting right lower extremity 03/03/2018   Insomnia 12/25/2017   Acute sinus infection 09/18/2017   Osteopenia 08/19/2017   Gluten intolerance 08/12/2017   Exercise-induced asthma 08/12/2017   Chest pain 03/30/2017   SOB (shortness of breath) 09/02/2016   Acute bronchitis 09/23/2015   Intracranial hypotension 01/24/2014    Vitamin D deficiency    Abnormal brain MRI 10/15/2013   LBP (low back pain) 05/10/2012   Fibromyalgia    Fatigue 01/17/2011   Atrophic vaginitis 10/17/2010   Vitamin B12 deficiency (non anemic) 10/04/2009   Hypothyroidism 10/03/2009   Migraine headache 10/03/2009    Current Outpatient Medications on File Prior to Visit  Medication Sig Dispense Refill   albuterol (VENTOLIN HFA) 108 (90 Base) MCG/ACT inhaler Inhale 2 puffs into the lungs every 6 (six) hours as needed for wheezing or shortness of breath. 6.7 g 5   levothyroxine (SYNTHROID) 125 MCG tablet TAKE 1 TABLET BY MOUTH EVERY DAY 90 tablet 3   nortriptyline (PAMELOR) 50 MG capsule TAKE 2 CAPSULES (100 MG TOTAL) BY MOUTH AT BEDTIME. 180 capsule 0   No current facility-administered medications on file prior to visit.    Past Medical History:  Diagnosis Date   ANEMIA, IRON DEFICIENCY    Cerebrospinal fluid leak from spinal puncture    improved with 7th patch at [facility] 05/2014   Depression    therapy   Fibromyalgia 1999   no meds   GASTRITIS 11/12/2009   Qualifier: Diagnosis of  By: Theadora Rama RN, Silvio Pate     GERD (gastroesophageal reflux disease)    per endo. - does not take any meds.     Kidney stones 1998, 2005   x 2   MIGRAINE HEADACHE    last Migraine 06/29/2011-  Unspecified hypothyroidism 1982/2000   surgical resection of benign growth - partial then complete   VITAMIN B12 DEFICIENCY 09/2009 dx    Past Surgical History:  Procedure Laterality Date   CHOLECYSTECTOMY  2009   DIAGNOSTIC LAPAROSCOPY  1982   diagnostic   POSTERIOR FUSION LUMBAR SPINE  06/2011   THYROID SURGERY  1983   partial    TOTAL THYROIDECTOMY  2000   Had thyroid nodule, nonmalignant (1982-2000) resection   TUBAL LIGATION  1989    Social History   Socioeconomic History   Marital status: Divorced    Spouse name: Not on file   Number of children: 3   Years of education: Not on file   Highest education level: Not on file  Occupational History    Not on file  Tobacco Use   Smoking status: Never   Smokeless tobacco: Never  Vaping Use   Vaping Use: Never used  Substance and Sexual Activity   Alcohol use: No    Alcohol/week: 0.0 standard drinks   Drug use: No   Sexual activity: Yes    Birth control/protection: Surgical    Comment: Married lives with spouse, 3 children. 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 Lives alone     Works at [employer] and [employer].  Education: high school 12th grade.      Exercises regularly   Social Determinants of Health   Financial Resource Strain: Not on file  Food Insecurity: Not on file  Transportation Needs: Not on file  Physical Activity: Not on file  Stress: Not on file  Social Connections: Not on file    Family History  Problem Relation Age of Onset   Hyperlipidemia Mother    Heart disease Mother 46       massive MI   Hyperlipidemia Father    COPD Father        severe, smoker   Breast cancer Maternal Grandmother    Hyperlipidemia Maternal Grandmother    Coronary artery disease Maternal Grandmother 77       CABG   Ovarian cancer Other    Anesthesia problems Neg Hx    Malignant hyperthermia Neg Hx    Pseudochol deficiency Neg Hx    Hypotension Neg Hx     Review of Systems  Constitutional:  Negative for fever.  Eyes:  Negative for visual disturbance.  Respiratory:  Negative for cough, shortness of breath and wheezing.   Cardiovascular:  Negative for chest pain, palpitations and leg swelling.  Gastrointestinal:  Positive for abdominal pain (from constipation) and constipation. Negative for blood in stool, diarrhea, nausea and vomiting.  Genitourinary:  Negative for dysuria.  Musculoskeletal:  Positive for back pain. Negative for arthralgias.  Skin:  Negative for rash.  Neurological:  Positive for headaches. Negative for dizziness and  light-headedness.  Psychiatric/Behavioral:  Negative for dysphoric mood. The patient is not nervous/anxious.       Objective:   Vitals:   04/02/21 1359  BP: 120/82  Pulse: (!) 101  Temp: 98.7 F (37.1 C)  SpO2: 97%   Filed Weights   04/02/21 1359  Weight: 141 lb (64 kg)   Body mass index is 24.98 kg/m.  BP Readings from Last 3 Encounters:  04/02/21 120/82  08/28/20 120/84  08/26/20 126/72    Wt Readings from Last 3 Encounters:  04/02/21 141 lb (64 kg)  08/28/20  138 lb 12.8 oz (63 kg)  08/25/20 142 lb (64.4 kg)    Dep[facility] screen [facility] 04/02/2021 10/05/2019 09/20/2018 08/12/2017  Decreased Interest 0 0 0 0  Down, Depressed, Hopeless 0 0 0 0  PHQ - 2 Score 0 0 0 0  Altered sleeping 1 - - -  Tired, decreased energy 0 - - -  Change in appetite 0 - - -  Feeling bad or failure about yourself  0 - - -  Trouble concentrating 0 - - -  Moving slowly or fidgety/restless 0 - - -  Suicidal thoughts 0 - - -  PHQ-9 Score 1 - - -  Difficult doing work/chores Not difficult at all - - -  Some recent data might be hidden    GAD 7 : Generalized Anxiety Score 04/02/2021  Nervous, Anxious, on Edge 0  Control/stop worrying 0  Worry too much - different things 0  Trouble relaxing 0  Restless 0  Easily annoyed or irritable 0  Afraid - awful might happen 0  Total GAD 7 Score 0       Physical Exam Constitutional: She appears well-developed and well-nourished. No distress.  HENT:  Head: Normocephalic and atraumatic.  Right Ear: External ear normal. Normal ear canal and TM Left Ear: External ear normal.  Normal ear canal and TM Mouth/Throat: Oropharynx is clear and moist.  Eyes: Conjunctivae and EOM are normal.  Neck: Neck supple. No tracheal deviation present. No thyromegaly present.  No carotid bruit  Cardiovascular: Normal rate, regular rhythm and normal heart sounds.   No murmur heard.  No edema. Pulmonary/Chest: Effort normal and breath sounds normal. No respiratory  distress. She has no wheezes. She has no rales.  Breast: deferred   Abdominal: Soft. She exhibits no distension. There is mild  tenderness in RUQ w/o rebound or guarding-related to constipation, which she is working on.  Lymphadenopathy: She has no cervical adenopathy.  Skin: Skin is warm and dry. She is not diaphoretic.  Psychiatric: She has a normal mood and affect. Her behavior is normal.     Lab Results  Component Value Date   WBC 15.1 (H) 08/25/2020   HGB 13.7 08/25/2020   HCT 42.0 08/25/2020   PLT 277 08/25/2020   GLUCOSE 128 (H) 08/25/2020   CHOL 207 (H) 09/20/2018   TRIG 121.0 09/20/2018   HDL 53.90 09/20/2018   LDLDIRECT 145.1 06/29/2013   LDLCALC 129 (H) 09/20/2018   ALT 20 08/25/2020   AST 29 08/25/2020   NA 137 08/25/2020   K 3.7 08/25/2020   CL 105 08/25/2020   CREATININE 0.85 08/25/2020   BUN 18 08/25/2020   CO2 22 08/25/2020   TSH 1.03 08/31/2019   INR 1.02 07/04/2011         Assessment & Plan:   Physical exam: Screening blood work  ordered Exercise  walking  Weight  normal Substance abuse  none   Screened for depression using the PHQ 9 scale.  No evidence of depression.   Screened for anxiety using GAD7 Scale.  No evidence of anxiety.   Reviewed recommended immunizations.   Health Maintenance  Topic Date Due   Zoster Vaccines- Shingrix (1 of 2) Never done   COLONOSCOPY (Pts [id number] Insurance coverage will need to be confirmed)  11/13/2019   DEXA SCAN  08/17/2020   COVID-19 Vaccine (3 - Booster for Pfizer series) 10/05/2020   PAP SMEAR-Modifier  11/04/2020   MAMMOGRAM  01/11/2021   INFLUENZA VACCINE  Never done  TETANUS/TDAP  08/29/2024   Hepatitis C Screening  Completed   HIV Screening  Completed   Pneumococcal Vaccine 92-82 Years old  Aged Out   HPV VACCINES  Aged Out          See Problem List for Assessment and Plan of chronic medical problems.

## 2021-04-02 ENCOUNTER — Ambulatory Visit (INDEPENDENT_AMBULATORY_CARE_PROVIDER_SITE_OTHER): Payer: 59 | Admitting: Internal Medicine

## 2021-04-02 ENCOUNTER — Encounter: Payer: Self-pay | Admitting: Internal Medicine

## 2021-04-02 ENCOUNTER — Other Ambulatory Visit: Payer: Self-pay

## 2021-04-02 VITALS — BP 120/82 | HR 101 | Temp 98.7°F | Ht 63.0 in | Wt 141.0 lb

## 2021-04-02 DIAGNOSIS — Z1331 Encounter for screening for depression: Secondary | ICD-10-CM | POA: Diagnosis not present

## 2021-04-02 DIAGNOSIS — Z1211 Encounter for screening for malignant neoplasm of colon: Secondary | ICD-10-CM

## 2021-04-02 DIAGNOSIS — E039 Hypothyroidism, unspecified: Secondary | ICD-10-CM | POA: Diagnosis not present

## 2021-04-02 DIAGNOSIS — E559 Vitamin D deficiency, unspecified: Secondary | ICD-10-CM | POA: Diagnosis not present

## 2021-04-02 DIAGNOSIS — G43809 Other migraine, not intractable, without status migrainosus: Secondary | ICD-10-CM | POA: Diagnosis not present

## 2021-04-02 DIAGNOSIS — Z0000 Encounter for general adult medical examination without abnormal findings: Secondary | ICD-10-CM

## 2021-04-02 DIAGNOSIS — R739 Hyperglycemia, unspecified: Secondary | ICD-10-CM | POA: Diagnosis not present

## 2021-04-02 DIAGNOSIS — M85852 Other specified disorders of bone density and structure, left thigh: Secondary | ICD-10-CM | POA: Diagnosis not present

## 2021-04-02 DIAGNOSIS — R519 Headache, unspecified: Secondary | ICD-10-CM

## 2021-04-02 DIAGNOSIS — E538 Deficiency of other specified B group vitamins: Secondary | ICD-10-CM | POA: Diagnosis not present

## 2021-04-02 LAB — CBC WITH DIFFERENTIAL/PLATELET
Basophils Absolute: 0.1 10*3/uL (ref 0.0–0.1)
Basophils Relative: 0.6 % (ref 0.0–3.0)
Eosinophils Absolute: 0.1 10*3/uL (ref 0.0–0.7)
Eosinophils Relative: 0.9 % (ref 0.0–5.0)
HCT: 36.3 % (ref 36.0–46.0)
Hemoglobin: 12 g/dL (ref 12.0–15.0)
Lymphocytes Relative: 28.5 % (ref 12.0–46.0)
Lymphs Abs: 2.9 10*3/uL (ref 0.7–4.0)
MCHC: 33 g/dL (ref 30.0–36.0)
MCV: 90.1 fl (ref 78.0–100.0)
Monocytes Absolute: 1 10*3/uL (ref 0.1–1.0)
Monocytes Relative: 9.7 % (ref 3.0–12.0)
Neutro Abs: 6.1 10*3/uL (ref 1.4–7.7)
Neutrophils Relative %: 60.3 % (ref 43.0–77.0)
Platelets: 260 10*3/uL (ref 150.0–400.0)
RBC: 4.03 Mil/uL (ref 3.87–5.11)
RDW: 13.6 % (ref 11.5–15.5)
WBC: 10.1 10*3/uL (ref 4.0–10.5)

## 2021-04-02 LAB — COMPREHENSIVE METABOLIC PANEL
ALT: 16 U/L (ref 0–35)
AST: 17 U/L (ref 0–37)
Albumin: 4 g/dL (ref 3.5–5.2)
Alkaline Phosphatase: 129 U/L — ABNORMAL HIGH (ref 39–117)
BUN: 13 mg/dL (ref 6–23)
CO2: 28 mEq/L (ref 19–32)
Calcium: 9.6 mg/dL (ref 8.4–10.5)
Chloride: 104 mEq/L (ref 96–112)
Creatinine, Ser: 0.84 mg/dL (ref 0.40–1.20)
GFR: 76.3 mL/min (ref >60.00)
Glucose, Bld: 91 mg/dL (ref 70–99)
Potassium: 3.7 mEq/L (ref 3.5–5.1)
Sodium: 139 mEq/L (ref 135–145)
Total Bilirubin: 0.4 mg/dL (ref 0.2–1.2)
Total Protein: 7.6 g/dL (ref 6.0–8.3)

## 2021-04-02 LAB — LIPID PANEL
Cholesterol: 200 mg/dL (ref 0–200)
HDL: 55.5 mg/dL (ref >39.00)
LDL Cholesterol: 105 mg/dL — ABNORMAL HIGH (ref 0–99)
NonHDL: 144.37
Total CHOL/HDL Ratio: 4
Triglycerides: 198 mg/dL — ABNORMAL HIGH (ref 0.0–149.0)
VLDL: 39.6 mg/dL (ref 0.0–40.0)

## 2021-04-02 LAB — VITAMIN D 25 HYDROXY (VIT D DEFICIENCY, FRACTURES): VITD: 23.8 ng/mL — ABNORMAL LOW (ref 30.00–100.00)

## 2021-04-02 LAB — TSH: TSH: 0.84 u[IU]/mL (ref 0.35–5.50)

## 2021-04-02 LAB — HEMOGLOBIN A1C: Hgb A1c MFr Bld: 5.9 % (ref 4.6–6.5)

## 2021-04-02 LAB — VITAMIN B12: Vitamin B-12: 248 pg/mL (ref 211–911)

## 2021-04-02 NOTE — Assessment & Plan Note (Addendum)
Chronic Somewhat controlled Continue nortriptyline 50 mg nightly, takes Excedrin migraine prn She will be seeing Dr. Everlena Cooper for head pain and discussed that she can discuss her migraines with him as well for other options

## 2021-04-02 NOTE — Assessment & Plan Note (Signed)
Chronic Check vitamin D level 

## 2021-04-02 NOTE — Assessment & Plan Note (Signed)
Chronic DEXA due-ordered She is walking regularly Not currently taking any supplements and has vitamin D deficiency-check vitamin D level

## 2021-04-02 NOTE — Assessment & Plan Note (Signed)
Chronic Not currently taking vitamin B12 regularly B12 level

## 2021-04-02 NOTE — Assessment & Plan Note (Signed)
Chronic Experiencing head pain typically when she is upright and moving around Headache goes away when she lays down She does have a history of intracranial hypotension due to CSF leak status post patch placement and is concerned the patch may not be working correctly Could also be related to noncompliance with a gluten diet and she will work on that Blood pressure intermittently elevated and she will monitor this closely to make sure that is not the cause Will refer to Dr. Everlena Cooper for further evaluation

## 2021-04-02 NOTE — Assessment & Plan Note (Signed)
Chronic Check a1c Low sugar / carb diet Stressed regular exercise  

## 2021-04-02 NOTE — Assessment & Plan Note (Signed)
Chronic  Clinically euthyroid Currently taking levothyroxine 125 mcg daily Check tsh  Titrate med dose if needed  

## 2021-04-11 ENCOUNTER — Encounter: Payer: Self-pay | Admitting: Neurology

## 2021-04-22 ENCOUNTER — Other Ambulatory Visit: Payer: Self-pay | Admitting: Internal Medicine

## 2021-05-27 ENCOUNTER — Encounter: Payer: Self-pay | Admitting: Internal Medicine

## 2021-06-02 NOTE — Progress Notes (Signed)
Subjective:    Patient ID: Melissa Kirby, female    DOB: 12-26-1960, 60 y.o.   MRN: [id number]  This visit occurred during the SARS-CoV-2 public health emergency.  Safety protocols were in place, including screening questions prior to the visit, additional usage of staff PPE, and extensive cleaning of exam room while observing appropriate contact time as indicated for disinfecting solutions.    HPI The patient is here for an acute visit.   Work is very stressful.  Her BP is high at work.  Her BP is good at home.  She can retire in May and will retire.  She is anxious because there is so much stress at work and it is affecting her overall quality of life.  She feels that she needs something to help her get through these next several months.    Medications and allergies reviewed with patient and updated if appropriate.  Patient Active Problem List   Diagnosis Date Noted   Hyperglycemia 04/01/2021   Headache 08/28/2020   COVID-19 07/04/2019   History of CMV 09/20/2018   Lumbar radiculopathy 05/20/2018   Lumbar back pain with radiculopathy affecting right lower extremity 03/03/2018   Insomnia 12/25/2017   Osteopenia 08/19/2017   Gluten intolerance 08/12/2017   Exercise-induced asthma 08/12/2017   Intracranial hypotension 01/24/2014   Vitamin D deficiency    Abnormal brain MRI 10/15/2013   LBP (low back pain) 05/10/2012   Fibromyalgia    Fatigue 01/17/2011   Atrophic vaginitis 10/17/2010   Vitamin B12 deficiency (non anemic) 10/04/2009   Hypothyroidism 10/03/2009   Migraine headache 10/03/2009    Current Outpatient Medications on File Prior to Visit  Medication Sig Dispense Refill   albuterol (VENTOLIN HFA) 108 (90 Base) MCG/ACT inhaler Inhale 2 puffs into the lungs every 6 (six) hours as needed for wheezing or shortness of breath. 6.7 g 5   levothyroxine (SYNTHROID) 125 MCG tablet TAKE 1 TABLET BY MOUTH EVERY DAY 90 tablet 3   nortriptyline (PAMELOR) 50 MG capsule TAKE 2  CAPSULES (100 MG TOTAL) BY MOUTH AT BEDTIME. 180 capsule 1   No current facility-administered medications on file prior to visit.    Past Medical History:  Diagnosis Date   ANEMIA, IRON DEFICIENCY    Cerebrospinal fluid leak from spinal puncture    improved with 7th patch at [facility] 05/2014   Depression    therapy   Fibromyalgia 1999   no meds   GASTRITIS 11/12/2009   Qualifier: Diagnosis of  By: Theadora Rama RN, Silvio Pate     GERD (gastroesophageal reflux disease)    per endo. - does not take any meds.     Kidney stones 1998, 2005   x 2   MIGRAINE HEADACHE    last Migraine 06/29/2011-    Unspecified hypothyroidism 1982/2000   surgical resection of benign growth - partial then complete   VITAMIN B12 DEFICIENCY 09/2009 dx    Past Surgical History:  Procedure Laterality Date   CHOLECYSTECTOMY  2009   DIAGNOSTIC LAPAROSCOPY  1982   diagnostic   POSTERIOR FUSION LUMBAR SPINE  06/2011   THYROID SURGERY  1983   partial    TOTAL THYROIDECTOMY  2000   Had thyroid nodule, nonmalignant (1982-2000) resection   TUBAL LIGATION  1989    Social History   Socioeconomic History   Marital status: Divorced    Spouse name: Not on file   Number of children: 3   Years of education: Not on file   Highest education  level: Not on file  Occupational History   Not on file  Tobacco Use   Smoking status: Never   Smokeless tobacco: Never  Vaping Use   Vaping Use: Never used  Substance and Sexual Activity   Alcohol use: No    Alcohol/week: 0.0 standard drinks   Drug use: No   Sexual activity: Yes    Birth control/protection: Surgical    Comment: Married lives with spouse, 3 children. 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 Lives alone     Works at [employer] and [employer].  Education: high school 12th grade.      Exercises regularly   Social Determinants of Health    Financial Resource Strain: Not on file  Food Insecurity: Not on file  Transportation Needs: Not on file  Physical Activity: Not on file  Stress: Not on file  Social Connections: Not on file    Family History  Problem Relation Age of Onset   Hyperlipidemia Mother    Heart disease Mother 53       massive MI   Hyperlipidemia Father    COPD Father        severe, smoker   Breast cancer Maternal Grandmother    Hyperlipidemia Maternal Grandmother    Coronary artery disease Maternal Grandmother 24       CABG   Ovarian cancer Other    Anesthesia problems Neg Hx    Malignant hyperthermia Neg Hx    Pseudochol deficiency Neg Hx    Hypotension Neg Hx     Review of Systems  Constitutional:  Negative for fever.  Respiratory:  Negative for shortness of breath.   Cardiovascular:  Negative for chest pain and palpitations.  Psychiatric/Behavioral:  Positive for sleep disturbance. Negative for dysphoric mood. The patient is nervous/anxious.       Objective:   Vitals:   06/03/21 0922  BP: 130/86  Pulse: (!) 102  Temp: 98.2 F (36.8 C)  SpO2: 97%   BP Readings from Last 3 Encounters:  06/03/21 130/86  04/02/21 120/82  08/28/20 120/84   Wt Readings from Last 3 Encounters:  06/03/21 144 lb (65.3 kg)  04/02/21 141 lb (64 kg)  08/28/20 138 lb 12.8 oz (63 kg)   Body mass index is 25.51 kg/m.   Physical Exam Constitutional:      General: She is not in acute distress.    Appearance: Normal appearance. She is not ill-appearing.  HENT:     Head: Normocephalic and atraumatic.  Skin:    General: Skin is warm and dry.  Neurological:     Mental Status: She is alert.  Psychiatric:        Behavior: Behavior normal.        Thought Content: Thought content normal.        Judgment: Judgment normal.     Comments: Mood is slightly anxious when discussing work           Assessment & Plan:    See Problem List for Assessment and Plan of chronic medical problems.

## 2021-06-03 ENCOUNTER — Ambulatory Visit: Payer: 59 | Admitting: Internal Medicine

## 2021-06-03 ENCOUNTER — Other Ambulatory Visit: Payer: Self-pay

## 2021-06-03 ENCOUNTER — Encounter: Payer: Self-pay | Admitting: Internal Medicine

## 2021-06-03 DIAGNOSIS — F419 Anxiety disorder, unspecified: Secondary | ICD-10-CM

## 2021-06-03 MED ORDER — BUSPIRONE HCL 5 MG PO TABS: 5.0000 mg | ORAL_TABLET | Freq: Three times a day (TID) | ORAL | 5 refills | Status: DC

## 2021-06-03 NOTE — Assessment & Plan Note (Signed)
Chronic Having increased anxiety related to work because it is so stressful She does feel that she needs something to help her get through until she is able to retire hopefully in May of next year Discussed options We will try BuSpar 5 mg 3 times daily If tolerated we can titrate the medication dose, if not we can consider low-dose SSRI since she is already on nortriptyline at night for her migraines She will update me on how she is doing Discussed regular exercise, utilizing her vacation time

## 2021-06-03 NOTE — Patient Instructions (Addendum)
     Medications changes include :   busapr 5 mg 2-3 times a day.    Your prescription(s) have been submitted to your pharmacy. Please take as directed and contact our office if you believe you are having problem(s) with the medication(s).

## 2021-06-05 ENCOUNTER — Encounter: Payer: Self-pay | Admitting: Internal Medicine

## 2021-06-05 MED ORDER — SERTRALINE HCL 50 MG PO TABS: 50.0000 mg | ORAL_TABLET | Freq: Every day | ORAL | 3 refills | Status: DC

## 2021-07-03 ENCOUNTER — Other Ambulatory Visit: Payer: Self-pay | Admitting: Internal Medicine

## 2021-07-16 NOTE — Progress Notes (Signed)
NEUROLOGY CONSULTATION NOTE  Melissa Kirby MRN: [id number] DOB: 1961-01-30  Referring provider: Cheryll Cockayne, MD Primary care provider: Cheryll Cockayne, MD  Reason for consult:  headache  Assessment/Plan:   Headache - stabbing headache similar to prior headaches that led to diagnosis of intracranial hypotension.  Will evaluate for recurrence.  Given the significant tender left temple, will evaluate for temporal arteritis as well.Marland Kitchen  MRI of brain with and without contrast - if imaging exhibits constellation of findings suggestive of intracranial hypotension, will refer back to [facility] Radiology. Check sed rate and CRP Follow up after testing.  Further recommendations pending results.   Subjective:  Melissa Kirby is a 60 year old right-handed woman with history of migraine, B12 deficiency, hypothyroidism, kidney stones, depression, and history of idiopathic intracranial hypotension and lumbar laminectomy and fusion who presents for headache.    HISTORY: In January-February 2015, she developed a stabbing headache several times daily.  Valsalva, bending forward, coughing and sneezing would make it worse.  Laying supine helped.  MRI of the brain was performed on 10/13/13, which revealed inferior displacement of the cerebellum and brainstem, with flattening of the ventral pons and diminished suprasellar cistern volume, as well as mild circumferential dural thickening, findings which may be seen in intracranial hypotension.  She underwent lumbar puncture with opening pressure of 11 and closing pressure less than 9.  CSF cell count 6, glucose 58, protein 35, gram stain and culture negative.  MRI of the cervical, thoracic and lumbar spines with and without contrast on 11/23/13 did not reveal any post-contrast signal abnormality or location for CSF leak.  CT myelogram revealed possible epidural leak on the right at T7-T8.  She underwent a blood patch on 01/13/14, and she was headache-free for almost 3 months but  then the pain recurred.  She was referred to radiology at [facility] for evaluation and possible intervention.  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   She underwent gluing.  Repeat MRI of brain on 08/24/2015 was essentially normal for age without evidence of sagging of the brainstem or abnormal dural enhancement to suggest recurrent intracranial hypotension.  She had been doing well for quite some time.  Over the past year, she started experiencing similar paroxysmal stabbing headaches in the front, lasting a couple of seconds.  During that moment, she has blackout of vision.  Initially they occurred once every couple of weeks, now at least once a week.  She also has significant tenderness to the left temple.  The back of head feels sore and tender to touch.  She hears her heartbeat in her head.  Coughing aggravates the frontal pain.  Headache is slightly better when laying flat on the bed without a pillow.  When she first gets up in the morning, she feels "weird" and nauseous.  She has had an eye exam that was reportedly unremarkable.  Migraines are well-controlled, particularly since starting a gluten-free diet.  Current NSAIDS/analgesics:  none Current triptans:  none Current ergotamine:  none Current anti-emetic:  none Current muscle relaxants:  none Current Antihypertensive medications:  none Current Antidepressant medications:  nortriptyline 100mg  QHS, sertraline 50mg  daily Current Anticonvulsant medications:  none Current anti-CGRP:  none Current Vitamins/Herbal/Supplements:  none Current Antihistamines/Decongestants:  none Other therapy:  none Hormone/birth control:  none Other medications:  Synthroid  Past NSAIDS/analgesics:  Fioricet, Mobic, tramadol Past abortive triptans:  none  Past abortive ergotamine:  none Past muscle relaxants:  Robaxin,  Skelaxin Past anti-emetic:  Zofran Past antihypertensive medications:  lisinopril Past antidepressant medications:  venlafaxine, escitalopram, fluoxetine Past anticonvulsant medications:  gabapentin Past anti-CGRP:  none Past vitamins/Herbal/Supplements:  none Past antihistamines/decongestants:  Benadryl Other past therapies:  none     PAST MEDICAL HISTORY: Past Medical History:  Diagnosis Date   ANEMIA, IRON DEFICIENCY    Cerebrospinal fluid leak from spinal puncture    improved with 7th patch at [facility] 05/2014   Depression    therapy   Fibromyalgia 1999   no meds   GASTRITIS 11/12/2009   Qualifier: Diagnosis of  By: Theadora Rama RN, Silvio Pate     GERD (gastroesophageal reflux disease)    per endo. - does not take any meds.     Kidney stones 1998, 2005   x 2   MIGRAINE HEADACHE    last Migraine 06/29/2011-    Unspecified hypothyroidism 1982/2000   surgical resection of benign growth - partial then complete   VITAMIN B12 DEFICIENCY 09/2009 dx    PAST SURGICAL HISTORY: Past Surgical History:  Procedure Laterality Date   CHOLECYSTECTOMY  2009   DIAGNOSTIC LAPAROSCOPY  1982   diagnostic   POSTERIOR FUSION LUMBAR SPINE  06/2011   THYROID SURGERY  1983   partial    TOTAL THYROIDECTOMY  2000   Had thyroid nodule, nonmalignant (1982-2000) resection   TUBAL LIGATION  1989    MEDICATIONS: Current Outpatient Medications on File Prior to Visit  Medication Sig Dispense Refill   albuterol (VENTOLIN HFA) 108 (90 Base) MCG/ACT inhaler Inhale 2 puffs into the lungs every 6 (six) hours as needed for wheezing or shortness of breath. 6.7 g 5   levothyroxine (SYNTHROID) 125 MCG tablet TAKE 1 TABLET BY MOUTH EVERY DAY 90 tablet 3   nortriptyline (PAMELOR) 50 MG capsule TAKE 2 CAPSULES (100 MG TOTAL) BY MOUTH AT BEDTIME. 180 capsule 1   sertraline (ZOLOFT) 50 MG tablet TAKE 1 TABLET BY MOUTH EVERY DAY 90 tablet 2   No current facility-administered medications on file prior to visit.     ALLERGIES: Allergies  Allergen Reactions   Compazine Anaphylaxis    Hypotension   Metoclopramide Hcl Anaphylaxis    Hypotension   Prochlorperazine Edisylate     Other reaction(s): Other (See Comments) Hemodynamic instability   Gluten Meal Other (See Comments)    Vitamin B irregulaties   Buspirone     nausea   Prozac [Fluoxetine Hcl]     Increased anxiety   Tramadol Nausea Only   Morphine And Related Nausea Only    FAMILY HISTORY: Family History  Problem Relation Age of Onset   Hyperlipidemia Mother    Heart disease Mother 90       massive MI   Hyperlipidemia Father    COPD Father        severe, smoker   Breast cancer Maternal Grandmother    Hyperlipidemia Maternal Grandmother    Coronary artery disease Maternal Grandmother 74       CABG   Ovarian cancer Other    Anesthesia problems Neg Hx    Malignant hyperthermia Neg Hx    Pseudochol deficiency Neg Hx    Hypotension Neg Hx     Objective:  Blood pressure (!) 138/93, pulse (!) 112, weight 148 lb (67.1 kg), SpO2 100 %. General: No acute distress.  Patient appears well-groomed.   Head:  Normocephalic/atraumatic.  Tenderness to palpation of left temple. Eyes:  fundi examined but  not visualized Neck: supple, no paraspinal tenderness, full range of motion Back: No paraspinal tenderness Heart: regular rate and rhythm Lungs: Clear to auscultation bilaterally. Vascular: No carotid bruits. Neurological Exam: Mental status: alert and oriented to person, place, and time, recent and remote memory intact, fund of knowledge intact, attention and concentration intact, speech fluent and not dysarthric, language intact. Cranial nerves: CN I: not tested CN II: pupils equal, round and reactive to light, visual fields intact CN III, IV, VI:  full range of motion, no nystagmus, no ptosis CN V: facial sensation intact. CN VII: upper and lower face symmetric CN VIII: hearing intact CN IX, X: gag intact, uvula midline CN XI:  sternocleidomastoid and trapezius muscles intact CN XII: tongue midline Bulk & Tone: normal, no fasciculations. Motor:  muscle strength 5/5 throughout Sensation:  Pinprick, temperature and vibratory sensation intact. Deep Tendon Reflexes:  2+ throughout,  toes downgoing.   Finger to nose testing:  Without dysmetria.   Heel to shin:  Without dysmetria.   Gait:  Normal station and stride.  Romberg negative.    Thank you for allowing me to take part in the care of this patient.  Shon Millet, DO  CC: Cheryll Cockayne, MD

## 2021-07-17 ENCOUNTER — Ambulatory Visit: Payer: 59 | Admitting: Neurology

## 2021-07-17 ENCOUNTER — Encounter: Payer: Self-pay | Admitting: Neurology

## 2021-07-17 ENCOUNTER — Other Ambulatory Visit: Payer: Self-pay

## 2021-07-17 ENCOUNTER — Other Ambulatory Visit: Payer: 59

## 2021-07-17 VITALS — BP 138/93 | HR 112 | Wt 148.0 lb

## 2021-07-17 DIAGNOSIS — G96810 Intracranial hypotension, unspecified: Secondary | ICD-10-CM | POA: Diagnosis not present

## 2021-07-17 DIAGNOSIS — R519 Headache, unspecified: Secondary | ICD-10-CM

## 2021-07-17 LAB — SEDIMENTATION RATE: Sed Rate: 43 mm/hr — ABNORMAL HIGH (ref 0–30)

## 2021-07-17 LAB — C-REACTIVE PROTEIN: CRP: <1 mg/dL (ref 0.5–20.0)

## 2021-07-17 NOTE — Patient Instructions (Signed)
Check MRI of brain with and without contrast Check sed rate and CRP Follow up after testing.  Further recommendations pending results.

## 2021-08-04 ENCOUNTER — Other Ambulatory Visit: Payer: 59

## 2021-08-04 ENCOUNTER — Other Ambulatory Visit: Payer: Self-pay

## 2021-08-04 ENCOUNTER — Ambulatory Visit
Admission: RE | Admit: 2021-08-04 | Discharge: 2021-08-04 | Disposition: A | Discharge status: Home / Self Care | Payer: 59 | Source: Ambulatory Visit | Attending: Neurology | Admitting: Neurology

## 2021-08-04 DIAGNOSIS — R519 Headache, unspecified: Secondary | ICD-10-CM

## 2021-08-04 DIAGNOSIS — G96810 Intracranial hypotension, unspecified: Secondary | ICD-10-CM

## 2021-08-04 MED ORDER — GADOBENATE DIMEGLUMINE 529 MG/ML IV SOLN
15.0000 mL | Freq: Once | INTRAVENOUS | Status: AC | PRN
  Administered 2021-08-04: 15 mL via INTRAVENOUS

## 2021-08-06 ENCOUNTER — Telehealth: Payer: Self-pay

## 2021-08-06 DIAGNOSIS — R519 Headache, unspecified: Secondary | ICD-10-CM

## 2021-08-06 MED ORDER — TOPIRAMATE 25 MG PO TABS: ORAL_TABLET | ORAL | 3 refills | Status: DC

## 2021-08-06 NOTE — Telephone Encounter (Signed)
-----   Message from Drema Dallas, DO sent at 08/05/2021 11:58 AM EST ----- MRI of brain shows some chronic age-related changes but no concerning findings or findings consistent with spinal fluid leak.  I would like to check CTA of head to make sure there isn't a vascular cause for these headaches.  To try and reduce headache frequency, I would start topiramate 25mg  at bedtime for one week, followed by 50mg  at bedtime.  May cause numbness and tingling sensation, so she shouldn't be worried if she experiences this and will typically resolve when her body adapts.

## 2021-08-15 ENCOUNTER — Other Ambulatory Visit: Payer: Self-pay

## 2021-08-15 ENCOUNTER — Ambulatory Visit
Admission: RE | Admit: 2021-08-15 | Discharge: 2021-08-15 | Disposition: A | Discharge status: Home / Self Care | Payer: 59 | Source: Ambulatory Visit | Attending: Neurology | Admitting: Neurology

## 2021-08-15 DIAGNOSIS — R519 Headache, unspecified: Secondary | ICD-10-CM

## 2021-08-15 MED ORDER — IOPAMIDOL (ISOVUE-370) INJECTION 76%
75.0000 mL | Freq: Once | INTRAVENOUS | Status: AC | PRN
  Administered 2021-08-15: 75 mL via INTRAVENOUS

## 2021-08-22 NOTE — Progress Notes (Signed)
Pt advised of Her CTA, Patient wanted to know if she should still take the topirmate and Gabapentin together.  The Topiramate is helping the intensity but she is still having the headaches.

## 2021-08-22 NOTE — Progress Notes (Signed)
Tried calling patient, No answer, LMOVM for pt to give [place] a call back.

## 2021-08-23 NOTE — Progress Notes (Signed)
LMOVM for pt. Please call the office back.

## 2021-08-30 ENCOUNTER — Ambulatory Visit: Payer: 59 | Admitting: Internal Medicine

## 2021-11-01 ENCOUNTER — Other Ambulatory Visit: Payer: Self-pay | Admitting: Internal Medicine

## 2021-11-12 ENCOUNTER — Other Ambulatory Visit: Payer: Self-pay | Admitting: Neurology

## 2021-11-13 ENCOUNTER — Telehealth: Payer: Self-pay | Admitting: Neurology

## 2021-11-13 ENCOUNTER — Other Ambulatory Visit: Payer: Self-pay | Admitting: Neurology

## 2021-11-13 MED ORDER — TOPIRAMATE 50 MG PO TABS: 50.0000 mg | ORAL_TABLET | Freq: Every day | ORAL | 3 refills | Status: DC

## 2021-11-13 NOTE — Telephone Encounter (Signed)
Patient called and stated the pharmacy called and denied her refill for the Topamax.  She is out of the medication.  [facility] uses [facility] on [address] ?

## 2022-01-23 DIAGNOSIS — R87610 Atypical squamous cells of undetermined significance on cytologic smear of cervix (ASC-US): Secondary | ICD-10-CM | POA: Insufficient documentation

## 2022-01-23 DIAGNOSIS — Z8249 Family history of ischemic heart disease and other diseases of the circulatory system: Secondary | ICD-10-CM | POA: Insufficient documentation

## 2022-01-29 ENCOUNTER — Other Ambulatory Visit: Payer: Self-pay | Admitting: Obstetrics and Gynecology

## 2022-01-29 DIAGNOSIS — Z8249 Family history of ischemic heart disease and other diseases of the circulatory system: Secondary | ICD-10-CM

## 2022-02-08 ENCOUNTER — Other Ambulatory Visit: Payer: Self-pay | Admitting: Neurology

## 2022-02-26 ENCOUNTER — Other Ambulatory Visit: Payer: Self-pay | Admitting: Internal Medicine

## 2022-03-06 LAB — HM DEXA SCAN

## 2022-03-06 LAB — HM MAMMOGRAPHY

## 2022-04-08 ENCOUNTER — Other Ambulatory Visit: Payer: 59

## 2022-04-29 ENCOUNTER — Other Ambulatory Visit: Payer: Self-pay | Admitting: Internal Medicine

## 2022-05-09 ENCOUNTER — Other Ambulatory Visit: Payer: Self-pay | Admitting: Neurology

## 2022-06-01 ENCOUNTER — Other Ambulatory Visit: Payer: Self-pay | Admitting: Internal Medicine

## 2022-06-02 NOTE — Telephone Encounter (Signed)
Prescription sent, has not been seen in a year-due for follow-up.  Please contact her.

## 2022-06-03 ENCOUNTER — Other Ambulatory Visit: Payer: Self-pay | Admitting: Internal Medicine

## 2022-06-30 ENCOUNTER — Ambulatory Visit
Admission: RE | Admit: 2022-06-30 | Discharge: 2022-06-30 | Disposition: A | Discharge status: Home / Self Care | Payer: No Typology Code available for payment source | Source: Ambulatory Visit | Attending: Obstetrics and Gynecology | Admitting: Obstetrics and Gynecology

## 2022-06-30 ENCOUNTER — Other Ambulatory Visit: Payer: 59

## 2022-06-30 DIAGNOSIS — Z8249 Family history of ischemic heart disease and other diseases of the circulatory system: Secondary | ICD-10-CM

## 2022-07-24 ENCOUNTER — Ambulatory Visit: Payer: 59 | Attending: Nurse Practitioner | Admitting: Nurse Practitioner

## 2022-07-24 VITALS — BP 114/72 | HR 105 | Ht 62.5 in | Wt 137.0 lb

## 2022-07-24 DIAGNOSIS — Z79899 Other long term (current) drug therapy: Secondary | ICD-10-CM | POA: Diagnosis not present

## 2022-07-24 DIAGNOSIS — E7800 Pure hypercholesterolemia, unspecified: Secondary | ICD-10-CM

## 2022-07-24 DIAGNOSIS — I2510 Atherosclerotic heart disease of native coronary artery without angina pectoris: Secondary | ICD-10-CM

## 2022-07-24 DIAGNOSIS — R000 Tachycardia, unspecified: Secondary | ICD-10-CM

## 2022-07-24 DIAGNOSIS — E785 Hyperlipidemia, unspecified: Secondary | ICD-10-CM | POA: Diagnosis not present

## 2022-07-24 DIAGNOSIS — I340 Nonrheumatic mitral (valve) insufficiency: Secondary | ICD-10-CM

## 2022-07-24 DIAGNOSIS — R002 Palpitations: Secondary | ICD-10-CM

## 2022-07-24 DIAGNOSIS — I2584 Coronary atherosclerosis due to calcified coronary lesion: Secondary | ICD-10-CM

## 2022-07-24 DIAGNOSIS — R5383 Other fatigue: Secondary | ICD-10-CM

## 2022-07-24 DIAGNOSIS — R739 Hyperglycemia, unspecified: Secondary | ICD-10-CM

## 2022-07-24 MED ORDER — METOPROLOL SUCCINATE ER 25 MG PO TB24: 12.5000 mg | ORAL_TABLET | Freq: Every day | ORAL | 6 refills | Status: DC

## 2022-07-24 NOTE — Progress Notes (Signed)
Cardiology Office Note:    Date:  07/24/2022  ID:  Melissa Kirby, DOB 01-12-1961, MRN [id number]  PCP:  Binnie Rail, MD   [place] Cardiologist:  Carlyle Dolly, MD     Referring MD: Arvella Nigh, MD   CC: Here for follow-up  History of Present Illness:    Melissa Kirby is a 62 y.o. female with a hx of the following:  Coronary artery calcification  History of palpitations Hypertension History of chest pain History of COVID-19 Migraines (sees Neurology) History of intracranial hypertension, status post procedure Thyroid disease, status post total thyroidectomy  Patient is a delightful 62 year old female with past medical history as mentioned above.  Was referred to cardiology services in 2021.  Diagnosed with COVID prior to 2021 and had ongoing symptoms of palpitations since.  Also noted some shortness of breath with episodes occasionally.  Denied any coffee, sodas, or alcohol use.  Did admit to squeezing chest pain sensation, not always associated with exertion, not positional, occurred daily at the time.  She reported she had a stress test around [id number].  Dr. Harl Bowie stated would not plan on doing stress testing at the time due to her atypical symptoms.  Mother had an MI at age 31.  7-day event monitor was arranged and was unremarkable. Echocardiogram showed normal EF, mild LVH, mild MR, no other significant valvular abnormalities. She had previously taken lisinopril 5 mg daily for elevated blood pressure, had hypotension with lisinopril, so medication was discontinued.  Dr. Harl Bowie stated to follow home BPs for the time and hold lisinopril would reevaluate at next visit.  Was told to follow-up in 6 weeks.  Today she presents for overdue follow-up.  She states she has been referred by her PCP due to the fact that she had a CT cardiac scoring test recently performed that showed mild CAD.  Coronary calcium score 3.22, 66th percentile for her demographic.  Does  have a significant family history of CAD and cardiovascular disease.  Denies any chest pain, shortness of breath, syncope, presyncope, dizziness, orthopnea, PND, swelling or significant weight changes, acute bleeding, or claudication.  Does admit to palpitations occasionally.  Denies any other questions or concerns today.  Past Medical History:  Diagnosis Date   ANEMIA, IRON DEFICIENCY    Cerebrospinal fluid leak from spinal puncture    improved with 7th patch at [facility] 05/2014   Depression    therapy   Fibromyalgia 1999   no meds   GASTRITIS 11/12/2009   Qualifier: Diagnosis of  By: Dicie Beam RN, Adela Lank     GERD (gastroesophageal reflux disease)    per endo. - does not take any meds.     Kidney stones 1998, 2005   x 2   MIGRAINE HEADACHE    last Migraine 06/29/2011-    Unspecified hypothyroidism 1982/2000   surgical resection of benign growth - partial then complete   VITAMIN B12 DEFICIENCY 09/2009 dx    Past Surgical History:  Procedure Laterality Date   CHOLECYSTECTOMY  2009   DIAGNOSTIC LAPAROSCOPY  1982   diagnostic   POSTERIOR FUSION LUMBAR SPINE  06/2011   THYROID SURGERY  1983   partial    TOTAL THYROIDECTOMY  2000   Had thyroid nodule, nonmalignant (1982-2000) resection   TUBAL LIGATION  1989    Current Medications: Current Meds  Medication Sig   albuterol (VENTOLIN HFA) 108 (90 Base) MCG/ACT inhaler Inhale 2 puffs into the lungs every 6 (six) hours as needed for  wheezing or shortness of breath.   levothyroxine (SYNTHROID) 125 MCG tablet TAKE 1 TABLET BY MOUTH EVERY DAY   nortriptyline (PAMELOR) 50 MG capsule TAKE 2 CAPSULES (100 MG TOTAL) BY MOUTH AT BEDTIME.   topiramate (TOPAMAX) 50 MG tablet TAKE 1 TABLET BY MOUTH EVERYDAY AT BEDTIME     Allergies:   Compazine, Metoclopramide hcl, Prochlorperazine edisylate, Gluten meal, Buspirone, Prozac [fluoxetine hcl], Tramadol, and Morphine and related   Social History   Socioeconomic History   Marital status: Divorced     Spouse name: Not on file   Number of children: 3   Years of education: Not on file   Highest education level: Not on file  Occupational History   Not on file  Tobacco Use   Smoking status: Never   Smokeless tobacco: Never  Vaping Use   Vaping Use: Never used  Substance and Sexual Activity   Alcohol use: No    Alcohol/week: 0.0 standard drinks of alcohol   Drug use: No   Sexual activity: Yes    Birth control/protection: Surgical    Comment: Married lives with spouse, 3 children. 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 Lives alone     Works at [employer] and [employer].  Education: high school 12th grade.      Exercises regularly      Right Handed   Lives in a two story home that has a basement   Drink Dr. Reino Kent   Social Determinants of Health   Financial Resource Strain: Not on file  Food Insecurity: Not on file  Transportation Needs: Not on file  Physical Activity: Not on file  Stress: Not on file  Social Connections: Not on file     Family History: The patient's family history includes Breast cancer in her maternal grandmother; COPD in her father; Coronary artery disease (age of onset: 20) in her maternal grandmother; Heart disease (age of onset: 61) in her mother; Hyperlipidemia in her father, maternal grandmother, and mother; Ovarian cancer in an other family member. There is no history of Anesthesia problems, Malignant hyperthermia, Pseudochol deficiency, or Hypotension.  ROS:   Review of Systems  Constitutional: Negative.   HENT: Negative.    Eyes: Negative.   Respiratory: Negative.    Cardiovascular:  Positive for palpitations. Negative for chest pain, orthopnea, claudication, leg swelling and PND.  Gastrointestinal: Negative.   Genitourinary: Negative.   Musculoskeletal: Negative.   Skin: Negative.   Neurological:  Positive for  headaches. Negative for dizziness, tingling, tremors, sensory change, speech change, focal weakness, seizures, loss of consciousness and weakness.       Hx of migraines. See HPI.   Endo/Heme/Allergies: Negative.   Psychiatric/Behavioral: Negative.      Please see the history of present illness.    All other systems reviewed and are negative.  EKGs/Labs/Other Studies Reviewed:    The following studies were reviewed today:   EKG:  EKG is ordered today.  The ekg ordered today demonstrates ST, 102 bpm, without acute ischemic changes.   Cardiac CT scoring on 06/30/2022: Coronary calcium score is 3.22 and this is that percentile 66 for subjects of the same age, gender, and ethnicity.  Echocardiogram on 08/25/2019:  1. Left ventricular ejection fraction, by visual estimation, is 60 to  65%. The left ventricle has normal function. There is mildly increased  left ventricular hypertrophy.  2. Left ventricular diastolic parameters are indeterminate.   3. The left ventricle has no regional wall motion abnormalities.   4. Global right ventricle has normal systolic function.The right  ventricular size is normal. No increase in right ventricular wall  thickness.   5. Left atrial size was normal.   6. Right atrial size was normal.   7. The mitral valve is mildly thickened. Mild mitral valve regurgitation.   8. The tricuspid valve is grossly normal. Tricuspid valve regurgitation  is trivial.   9. The aortic valve is tricuspid. Aortic valve regurgitation is trivial.  10. The pulmonic valve was grossly normal. Pulmonic valve regurgitation is  trivial.  11. TR signal is inadequate for assessing pulmonary artery systolic  pressure.  12. The inferior vena cava is normal in size with greater than 50%  respiratory variability, suggesting right atrial pressure of 3 mmHg.    Cardiac monitor on 09/12/2019: 7 day event monitor Min HR 73, Max HR 142, Avg HR 98 No symptoms reported Telemetry tracings  show sinus rhythm and sinus tachycardia, isolated PVC No significant arrhythmias  Recent Labs: 07/25/2022: ALT 18; BUN 9; Creatinine, Ser 0.97; Potassium 4.2; Sodium 137  Recent Lipid Panel    Component Value Date/Time   CHOL 209 (H) 07/25/2022 0809   TRIG 90 07/25/2022 0809   HDL 58 07/25/2022 0809   CHOLHDL 3.6 07/25/2022 0809   VLDL 18 07/25/2022 0809   LDLCALC 133 (H) 07/25/2022 0809   LDLDIRECT 145.1 06/29/2013 1051     Risk Assessment/Calculations:       The 10-year ASCVD risk score (Arnett DK, et al., 2019) is: 3.5%   Values used to calculate the score:     Age: 40 years     Sex: Female     Is Non-Hispanic African American: No     Diabetic: No     Tobacco smoker: No     Systolic Blood Pressure: 161 mmHg     Is BP treated: Yes     HDL Cholesterol: 58 mg/dL     Total Cholesterol: 209 mg/dL       Physical Exam:    VS:  BP 114/72 (BP Location: Left Arm, Patient Position: Sitting, Cuff Size: Normal)   Pulse (!) 105   Ht 5' 2.5" (1.588 m)   Wt 137 lb (62.1 kg)   SpO2 97%   BMI 24.66 kg/m     Wt Readings from Last 3 Encounters:  07/24/22 137 lb (62.1 kg)  07/17/21 148 lb (67.1 kg)  06/03/21 144 lb (65.3 kg)     GEN: Well nourished, well developed in no acute distress HEENT: Normal NECK: No JVD; No carotid bruits CARDIAC: S1/S2, regular rhythm and fast rate, no murmurs, rubs, gallops; 2+ pulses throughout RESPIRATORY:  Clear to auscultation without rales, wheezing or rhonchi  MUSCULOSKELETAL:  No edema; No deformity  SKIN: Warm and dry NEUROLOGIC:  Alert and oriented x 3 PSYCHIATRIC:  Normal affect   ASSESSMENT:    1. Coronary artery calcification   2. Hyperlipidemia, unspecified hyperlipidemia type   3. Medication management   4. Palpitations   5. Tachycardia   6. Fatigue, unspecified type   7. Nonrheumatic mitral valve regurgitation    PLAN:    In order of problems listed above:  Coronary artery calcification Coronary calcium score 3.22 as  seen on previous CT cardiac scoring, particularly noted along LAD. Family history of CVD. Stable with no anginal symptoms. No indication for ischemic evaluation. Politely declined ASA therapy  at this time, will reconsider this at next f/u visit due to family history. Will initiate low dose Toprol XL as mentioned below. Will obtain FLP and CMET as mentioned below. Heart healthy diet and regular cardiovascular exercise encouraged.   HLD Lipid panel in 2022 revealed total cholesterol 200, triglycerides 198, HDL 56, and LDL 105.  Goal LDL is less than 100.  Will recheck FLP and CMET as mentioned above.  Patient request to work on lifestyle modifications at this time before starting on medication. Heart healthy diet and regular cardiovascular exercise encouraged.   Palpitations, ST Does admit to occasional palpitations.  She is in sinus tachycardia on exam today. Monitor in 2021 unremarkable. Will initiate low-dose metoprolol succinate as mentioned above. Will check CMET. Heart healthy diet and regular cardiovascular exercise encouraged. If no improvement in palpitations by next visit, consider arranging a monitor.   Mild MR, Fatigue Does admit to some fatigue. Echo in 2021 revealed normal EF. Mild MR noted. According to guidelines, she is due for repeat Echo imaging. Will arrange Echo at this time. Heart healthy diet and regular cardiovascular exercise encouraged. Continue to follow with PCP.  5. Disposition: Follow up with me in 8 weeks or sooner if anything changes.   Medication Adjustments/Labs and Tests Ordered: Current medicines are reviewed at length with the patient today.  Concerns regarding medicines are outlined above.  Orders Placed This Encounter  Procedures   Lipid panel   Comprehensive metabolic panel   EKG 12-Lead   ECHOCARDIOGRAM COMPLETE   Meds ordered this encounter  Medications   metoprolol succinate (TOPROL XL) 25 MG 24 hr tablet    Sig: Take 0.5 tablets (12.5 mg total) by  mouth daily.    Dispense:  15 tablet    Refill:  6    New 07/24/2021    Patient Instructions  Medication Instructions:  Begin Toprol XL 12.5mg  daily  Continue all other medications.     Labwork: FLP, CMET - orders given today Reminder:  Nothing to eat or drink after 12 midnight prior to labs.  Testing/Procedures: Your physician has requested that you have an echocardiogram. Echocardiography is a painless test that uses sound waves to create images of your heart. It provides your doctor with information about the size and shape of your heart and how well your heart's chambers and valves are working. This procedure takes approximately one hour. There are no restrictions for this procedure. Please do NOT wear cologne, perfume, aftershave, or lotions (deodorant is allowed). Please arrive 15 minutes prior to your appointment time.  Follow-Up: Office will contact with results via phone, letter or mychart.   8 weeks   Any Other Special Instructions Will Be Listed Below (If Applicable). Your physician has requested that you regularly monitor and record your blood pressure readings at home. Please use the same machine at the same time of day to check your readings and record them to bring to your follow-up visit.   If you need a refill on your cardiac medications before your next appointment, please call your pharmacy.    Signed, Sharlene Dory, NP  07/25/2022 4:56 PM    [facility]

## 2022-07-24 NOTE — Patient Instructions (Signed)
Medication Instructions:  Begin Toprol XL 12.5mg  daily  Continue all other medications.     Labwork: FLP, CMET - orders given today Reminder:  Nothing to eat or drink after 12 midnight prior to labs.  Testing/Procedures: Your physician has requested that you have an echocardiogram. Echocardiography is a painless test that uses sound waves to create images of your heart. It provides your doctor with information about the size and shape of your heart and how well your heart's chambers and valves are working. This procedure takes approximately one hour. There are no restrictions for this procedure. Please do NOT wear cologne, perfume, aftershave, or lotions (deodorant is allowed). Please arrive 15 minutes prior to your appointment time.  Follow-Up: Office will contact with results via phone, letter or mychart.   8 weeks   Any Other Special Instructions Will Be Listed Below (If Applicable). Your physician has requested that you regularly monitor and record your blood pressure readings at home. Please use the same machine at the same time of day to check your readings and record them to bring to your follow-up visit.   If you need a refill on your cardiac medications before your next appointment, please call your pharmacy.

## 2022-07-24 NOTE — Progress Notes (Deleted)
Cardiology Office Note:    Date:  07/24/2022   ID:  Melissa Kirby, DOB 1960-08-03, MRN [id number]  PCP:  Binnie Rail, MD   [place] [facility] Cardiologist:  Carlyle Dolly, MD { Click to update primary MD,subspecialty MD or APP then REFRESH:1}    Referring MD: Arvella Nigh, MD   No chief complaint on file. ***  History of Present Illness:    Melissa Kirby is a 62 y.o. female with a hx of ***   Palpitations Hypertension History of chest pain History of COVID-19   Patient is a 62 year old female with past medical history as mentioned above.  Was referred to cardiology services in 2021.  Diagnosed with COVID prior to 2021 and had ongoing symptoms of palpitations since.  Also has some shortness of breath with episodes occasionally.  Denied any coffee, sodas, or alcohol use.  Did admit to squeezing chest pain sensation, not always associated with exertion, not positional, occur daily.  She reported she had a stress test around 2007 2008.  Dr. Harl Bowie stated would not plan on doing stress testing due to atypical symptoms.  Mother had an MI at age 66.  7-day event monitor was arranged and was unremarkable.  Echocardiogram showed normal EF, mild LVH, mild MR, no other significant valvular abnormalities. She had previously taken lisinopril 5 mg daily for elevated blood pressure, had hypotension with lisinopril so medication was discontinued.  Dr. Harl Bowie stated to follow home BPs for now and hold lisinopril would reevaluate at next visit.  Was told to follow-up in 6 weeks.  Today she presents for overdue follow-up.  She states.           Past Medical History:  Diagnosis Date   ANEMIA, IRON DEFICIENCY    Cerebrospinal fluid leak from spinal puncture    improved with 7th patch at [facility] 05/2014   Depression    therapy   Fibromyalgia 1999   no meds   GASTRITIS 11/12/2009   Qualifier: Diagnosis of  By: Dicie Beam RN, Adela Lank     GERD (gastroesophageal reflux disease)     per endo. - does not take any meds.     Kidney stones 1998, 2005   x 2   MIGRAINE HEADACHE    last Migraine 06/29/2011-    Unspecified hypothyroidism 1982/2000   surgical resection of benign growth - partial then complete   VITAMIN B12 DEFICIENCY 09/2009 dx    Past Surgical History:  Procedure Laterality Date   CHOLECYSTECTOMY  2009   DIAGNOSTIC LAPAROSCOPY  1982   diagnostic   POSTERIOR FUSION LUMBAR SPINE  06/2011   THYROID SURGERY  1983   partial    TOTAL THYROIDECTOMY  2000   Had thyroid nodule, nonmalignant (1982-2000) resection   TUBAL LIGATION  1989    Current Medications: No outpatient medications have been marked as taking for the 07/24/22 encounter (Appointment) with Finis Bud, NP.     Allergies:   Compazine, Metoclopramide hcl, Prochlorperazine edisylate, Gluten meal, Buspirone, Prozac [fluoxetine hcl], Tramadol, and Morphine and related   Social History   Socioeconomic History   Marital status: Divorced    Spouse name: Not on file   Number of children: 3   Years of education: Not on file   Highest education level: Not on file  Occupational History   Not on file  Tobacco Use   Smoking status: Never   Smokeless tobacco: Never  Vaping Use   Vaping Use: Never used  Substance and Sexual  Activity   Alcohol use: No    Alcohol/week: 0.0 standard drinks of alcohol   Drug use: No   Sexual activity: Yes    Birth control/protection: Surgical    Comment: Married lives with spouse, 3 children. 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 Lives alone     Works at [employer] and [employer].  Education: high school 12th grade.      Exercises regularly      Right Handed   Lives in a two story home that has a basement   Drink Dr. Reino Kent   Social Determinants of Health   Financial Resource Strain: Not on file  Food Insecurity: Not on file   Transportation Needs: Not on file  Physical Activity: Not on file  Stress: Not on file  Social Connections: Not on file     Family History: The patient's ***family history includes Breast cancer in her maternal grandmother; COPD in her father; Coronary artery disease (age of onset: 4) in her maternal grandmother; Heart disease (age of onset: 59) in her mother; Hyperlipidemia in her father, maternal grandmother, and mother; Ovarian cancer in an other family member. There is no history of Anesthesia problems, Malignant hyperthermia, Pseudochol deficiency, or Hypotension.  ROS:   Please see the history of present illness.    *** All other systems reviewed and are negative.  EKGs/Labs/Other Studies Reviewed:    The following studies were reviewed today: ***  EKG:  EKG is *** ordered today.  The ekg ordered today demonstrates ***  Recent Labs: No results found for requested labs within last 365 days.  Recent Lipid Panel    Component Value Date/Time   CHOL 200 04/02/2021 1439   TRIG 198.0 (H) 04/02/2021 1439   HDL 55.50 04/02/2021 1439   CHOLHDL 4 04/02/2021 1439   VLDL 39.6 04/02/2021 1439   LDLCALC 105 (H) 04/02/2021 1439   LDLDIRECT 145.1 06/29/2013 1051     Risk Assessment/Calculations:   {Does this patient have ATRIAL FIBRILLATION?:[phone]}  No BP recorded.  {Refresh Note OR Click here to enter BP  :1}***         Physical Exam:    VS:  There were no vitals taken for this visit.    Wt Readings from Last 3 Encounters:  07/17/21 148 lb (67.1 kg)  06/03/21 144 lb (65.3 kg)  04/02/21 141 lb (64 kg)     GEN: *** Well nourished, well developed in no acute distress HEENT: Normal NECK: No JVD; No carotid bruits LYMPHATICS: No lymphadenopathy CARDIAC: ***RRR, no murmurs, rubs, gallops RESPIRATORY:  Clear to auscultation without rales, wheezing or rhonchi  ABDOMEN: Soft, non-tender, non-distended MUSCULOSKELETAL:  No edema; No deformity  SKIN: Warm and  dry NEUROLOGIC:  Alert and oriented x 3 PSYCHIATRIC:  Normal affect   ASSESSMENT:    No diagnosis found. PLAN:    In order of problems listed above:  ***      {Are you ordering a CV Procedure (e.g. stress test, cath, DCCV, TEE, etc)?   Press F2        :[id number]}    Medication Adjustments/Labs and Tests Ordered: Current medicines are reviewed at length with the patient today.  Concerns regarding medicines are outlined above.  No orders of the defined types were placed in this encounter.  No orders of the defined types were placed in this encounter.  There are no Patient Instructions on file for this visit.   Signed, Finis Bud, NP  07/24/2022 1:01 PM    [place]

## 2022-07-25 ENCOUNTER — Encounter: Payer: Self-pay | Admitting: Nurse Practitioner

## 2022-07-25 ENCOUNTER — Other Ambulatory Visit (HOSPITAL_COMMUNITY)
Admission: RE | Admit: 2022-07-25 | Discharge: 2022-07-25 | Disposition: A | Discharge status: Home / Self Care | Payer: 59 | Source: Ambulatory Visit | Attending: Nurse Practitioner | Admitting: Nurse Practitioner

## 2022-07-25 DIAGNOSIS — R5383 Other fatigue: Secondary | ICD-10-CM | POA: Diagnosis not present

## 2022-07-25 DIAGNOSIS — R739 Hyperglycemia, unspecified: Secondary | ICD-10-CM | POA: Diagnosis present

## 2022-07-25 DIAGNOSIS — Z79899 Other long term (current) drug therapy: Secondary | ICD-10-CM | POA: Diagnosis present

## 2022-07-25 DIAGNOSIS — E7800 Pure hypercholesterolemia, unspecified: Secondary | ICD-10-CM | POA: Insufficient documentation

## 2022-07-25 LAB — LIPID PANEL
Cholesterol: 209 mg/dL — ABNORMAL HIGH (ref 0–200)
HDL: 58 mg/dL (ref >40)
LDL Cholesterol: 133 mg/dL — ABNORMAL HIGH (ref 0–99)
Total CHOL/HDL Ratio: 3.6 RATIO
Triglycerides: 90 mg/dL (ref <150)
VLDL: 18 mg/dL (ref 0–40)

## 2022-07-25 LAB — COMPREHENSIVE METABOLIC PANEL
ALT: 18 U/L (ref 0–44)
AST: 24 U/L (ref 15–41)
Albumin: 4 g/dL (ref 3.5–5.0)
Alkaline Phosphatase: 155 U/L — ABNORMAL HIGH (ref 38–126)
Anion gap: 6 (ref 5–15)
BUN: 9 mg/dL (ref 6–20)
CO2: 22 mmol/L (ref 22–32)
Calcium: 9.2 mg/dL (ref 8.9–10.3)
Chloride: 109 mmol/L (ref 98–111)
Creatinine, Ser: 0.97 mg/dL (ref 0.44–1.00)
GFR, Estimated: >60 mL/min (ref >60)
Glucose, Bld: 98 mg/dL (ref 70–99)
Potassium: 4.2 mmol/L (ref 3.5–5.1)
Sodium: 137 mmol/L (ref 135–145)
Total Bilirubin: 0.7 mg/dL (ref 0.3–1.2)
Total Protein: 7.8 g/dL (ref 6.5–8.1)

## 2022-07-29 ENCOUNTER — Encounter: Payer: Self-pay | Admitting: *Deleted

## 2022-07-30 ENCOUNTER — Other Ambulatory Visit: Payer: Self-pay | Admitting: *Deleted

## 2022-07-30 DIAGNOSIS — E785 Hyperlipidemia, unspecified: Secondary | ICD-10-CM

## 2022-07-30 DIAGNOSIS — Z79899 Other long term (current) drug therapy: Secondary | ICD-10-CM

## 2022-07-30 MED ORDER — ATORVASTATIN CALCIUM 20 MG PO TABS: 20.0000 mg | ORAL_TABLET | Freq: Every day | ORAL | 6 refills | Status: DC

## 2022-07-31 ENCOUNTER — Ambulatory Visit: Payer: 59 | Attending: Nurse Practitioner

## 2022-07-31 DIAGNOSIS — R5383 Other fatigue: Secondary | ICD-10-CM

## 2022-07-31 DIAGNOSIS — I088 Other rheumatic multiple valve diseases: Secondary | ICD-10-CM

## 2022-08-02 ENCOUNTER — Other Ambulatory Visit: Payer: Self-pay | Admitting: Neurology

## 2022-08-03 LAB — ECHOCARDIOGRAM COMPLETE
AR max vel: 2.51 cm2
AV Area VTI: 3.33 cm2
AV Area mean vel: 2.45 cm2
AV Mean grad: 3.2 mmHg
AV Peak grad: 5.7 mmHg
Ao pk vel: 1.19 m/s
Area-P 1/2: 2.76 cm2
Calc EF: 66.8 %
MV M vel: 4.17 m/s
MV Peak grad: 69.6 mmHg
S' Lateral: 2.6 cm
Single Plane A2C EF: 59.2 %
Single Plane A4C EF: 73.2 %

## 2022-08-07 NOTE — Progress Notes (Signed)
NEUROLOGY FOLLOW UP OFFICE NOTE  Melissa Kirby [id number]  Assessment/Plan:   Headache - stabbing headache similar to prior headaches that led to diagnosis of intracranial hypotension.  Will evaluate for recurrence.  Given the significant tender left temple, will evaluate for temporal arteritis as well..   Continue topiramate 50mg  at bedtime Follow up in one year     Subjective:  Melissa Kirby is a 62 year old right-handed woman with history of migraine, B12 deficiency, hypothyroidism, kidney stones, depression, and history of idiopathic intracranial hypotension and lumbar laminectomy and fusion who follows up for headache.  UPDATE: Last seen for initial consultation in December 2022.  Lost to follow-up.  Workup for headache included labs on 07/17/2021 which revealed sed rate 43 and negative CRP.  MRI of brain with and without contrast on 08/04/2021 personally reviewed revealed nonspecific mild cerebral white matter signal changes likely chronic small vessel ischemic changes.    Started topiramate.    Doing well.  No migraines.  No other head pain.     Current NSAIDS/analgesics:  none Current triptans:  none Current ergotamine:  none Current anti-emetic:  none Current muscle relaxants:  none Current Antihypertensive medications:  none Current Antidepressant medications:  nortriptyline 100mg  QHS, sertraline 50mg  daily Current Anticonvulsant medications:  topiramate 50mg  QHS Current anti-CGRP:  none Current Vitamins/Herbal/Supplements:  none Current Antihistamines/Decongestants:  none Other therapy:  none Hormone/birth control:  none Other medications:  Synthroid     HISTORY: In January-February 2015, she developed a stabbing headache several times daily.  Valsalva, bending forward, coughing and sneezing would make it worse.  Laying supine helped.  MRI of the brain was performed on 10/13/13, which revealed inferior displacement of the cerebellum and brainstem, with flattening of  the ventral pons and diminished suprasellar cistern volume, as well as mild circumferential dural thickening, findings which may be seen in intracranial hypotension.  She underwent lumbar puncture with opening pressure of 11 and closing pressure less than 9.  CSF cell count 6, glucose 58, protein 35, gram stain and culture negative.  MRI of the cervical, thoracic and lumbar spines with and without contrast on 11/23/13 did not reveal any post-contrast signal abnormality or location for CSF leak.  CT myelogram revealed possible epidural leak on the right at T7-T8.  She underwent a blood patch on 01/13/14, and she was headache-free for almost 3 months but then the pain recurred.  She was referred to radiology at [facility] for evaluation and possible intervention.  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   She underwent gluing.  Repeat MRI of brain on 08/24/2015 was essentially normal for age without evidence of sagging of the brainstem or abnormal dural enhancement to suggest recurrent intracranial hypotension.   She had been doing well for quite some time.  Over the past year, she started experiencing similar paroxysmal stabbing headaches in the front, lasting a couple of seconds.  During that moment, she has blackout of vision.  Initially they occurred once every couple of weeks, now at least once a week.  She also has significant tenderness to the left temple.  The back of head feels sore and tender to touch.  She hears her heartbeat in her head.  Coughing aggravates the frontal pain.  Headache is slightly better when laying flat on the bed without a pillow.  When she first gets up in the morning,  she feels "weird" and nauseous.  She has had an eye exam that was reportedly unremarkable.   Migraines are well-controlled, particularly since starting a gluten-free diet.    Past  NSAIDS/analgesics:  Fioricet, Mobic, tramadol Past abortive triptans:  none Past abortive ergotamine:  none Past muscle relaxants:  Robaxin, Skelaxin Past anti-emetic:  Zofran Past antihypertensive medications:  lisinopril Past antidepressant medications:  venlafaxine, escitalopram, fluoxetine Past anticonvulsant medications:  gabapentin Past anti-CGRP:  none Past vitamins/Herbal/Supplements:  none Past antihistamines/decongestants:  Benadryl Other past therapies:  none  PAST MEDICAL HISTORY: Past Medical History:  Diagnosis Date   ANEMIA, IRON DEFICIENCY    Cerebrospinal fluid leak from spinal puncture    improved with 7th patch at [facility] 05/2014   Depression    therapy   Fibromyalgia 1999   no meds   GASTRITIS 11/12/2009   Qualifier: Diagnosis of  By: Dicie Beam RN, Adela Lank     GERD (gastroesophageal reflux disease)    per endo. - does not take any meds.     Kidney stones 1998, 2005   x 2   MIGRAINE HEADACHE    last Migraine 06/29/2011-    Unspecified hypothyroidism 1982/2000   surgical resection of benign growth - partial then complete   VITAMIN B12 DEFICIENCY 09/2009 dx    MEDICATIONS: Current Outpatient Medications on File Prior to Visit  Medication Sig Dispense Refill   atorvastatin (LIPITOR) 20 MG tablet Take 1 tablet (20 mg total) by mouth daily. 30 tablet 6   albuterol (VENTOLIN HFA) 108 (90 Base) MCG/ACT inhaler Inhale 2 puffs into the lungs every 6 (six) hours as needed for wheezing or shortness of breath. 6.7 g 5   levothyroxine (SYNTHROID) 125 MCG tablet TAKE 1 TABLET BY MOUTH EVERY DAY 90 tablet 1   metoprolol succinate (TOPROL XL) 25 MG 24 hr tablet Take 0.5 tablets (12.5 mg total) by mouth daily. 15 tablet 6   nortriptyline (PAMELOR) 50 MG capsule TAKE 2 CAPSULES (100 MG TOTAL) BY MOUTH AT BEDTIME. 180 capsule 0   topiramate (TOPAMAX) 50 MG tablet TAKE 1 TABLET BY MOUTH EVERYDAY AT BEDTIME 90 tablet 0   No current facility-administered medications on file prior to  visit.    ALLERGIES: Allergies  Allergen Reactions   Compazine Anaphylaxis    Hypotension   Metoclopramide Hcl Anaphylaxis    Hypotension   Prochlorperazine Edisylate     Other reaction(s): Other (See Comments) Hemodynamic instability   Gluten Meal Other (See Comments)    Vitamin B irregulaties   Buspirone     nausea   Prozac [Fluoxetine Hcl]     Increased anxiety   Tramadol Nausea Only   Morphine And Related Nausea Only    FAMILY HISTORY: Family History  Problem Relation Age of Onset   Hyperlipidemia Mother    Heart disease Mother 57       massive MI   Hyperlipidemia Father    COPD Father        severe, smoker   Breast cancer Maternal Grandmother    Hyperlipidemia Maternal Grandmother    Coronary artery disease Maternal Grandmother 78       CABG   Ovarian cancer Other    Anesthesia problems Neg Hx    Malignant hyperthermia Neg Hx    Pseudochol deficiency Neg Hx    Hypotension Neg Hx       Objective:  Blood pressure 106/71, pulse 100, height 5\' 2"  (1.575 m), weight 136 lb (61.7 kg), SpO2 99 %. General: No acute distress.  Patient appears well-groomed.   Head:  Normocephalic/atraumatic Eyes:  Fundi examined but not visualized Neck: supple, no paraspinal tenderness, full range of motion Heart:  Regular rate and rhythm Lungs:  Clear to auscultation bilaterally Back: No paraspinal tenderness Neurological Exam: alert and oriented to person, place, and time.  Speech fluent and not dysarthric, language intact.  CN II-XII intact. Bulk and tone normal, muscle strength 5/5 throughout.  Sensation to light touch intact.  Deep tendon reflexes 2+ throughout, toes downgoing.  Finger to nose testing intact.  Gait normal, Romberg negative.   Metta Clines, DO  CC: Billey Gosling, MD

## 2022-08-11 ENCOUNTER — Encounter: Payer: Self-pay | Admitting: Neurology

## 2022-08-11 ENCOUNTER — Ambulatory Visit: Payer: 59 | Admitting: Neurology

## 2022-08-11 VITALS — BP 106/71 | HR 100 | Ht 62.0 in | Wt 136.0 lb

## 2022-08-11 DIAGNOSIS — R519 Headache, unspecified: Secondary | ICD-10-CM

## 2022-08-11 LAB — HM PAP SMEAR: HM Pap smear: NORMAL

## 2022-08-11 MED ORDER — TOPIRAMATE 50 MG PO TABS: ORAL_TABLET | ORAL | 3 refills | Status: DC

## 2022-08-18 ENCOUNTER — Encounter: Payer: Self-pay | Admitting: *Deleted

## 2022-08-24 ENCOUNTER — Other Ambulatory Visit: Payer: Self-pay | Admitting: Internal Medicine

## 2022-09-03 ENCOUNTER — Other Ambulatory Visit: Payer: Self-pay | Admitting: Internal Medicine

## 2022-09-04 ENCOUNTER — Encounter: Payer: Self-pay | Admitting: Internal Medicine

## 2022-09-04 NOTE — Progress Notes (Unsigned)
Subjective:    Patient ID: Melissa Kirby, female    DOB: [birth year], 61 y.o.   MRN: [id number]     HPI Melissa Kirby is here for follow up of her chronic medical problems, including hypothyroidism, migraines headaches  Last week - URI and went to urgent care.  Took a zpak.    She is exercising.    Medications and allergies reviewed with patient and updated if appropriate.  Current Outpatient Medications on File Prior to Visit  Medication Sig Dispense Refill   albuterol (VENTOLIN HFA) 108 (90 Base) MCG/ACT inhaler Inhale 2 puffs into the lungs every 6 (six) hours as needed for wheezing or shortness of breath. 6.7 g 5   atorvastatin (LIPITOR) 20 MG tablet Take 1 tablet (20 mg total) by mouth daily. 30 tablet 6   azithromycin (ZITHROMAX) 250 MG tablet Take by mouth.     cefdinir (OMNICEF) 300 MG capsule      gabapentin (NEURONTIN) 300 MG capsule TAKE 1 CAPSULE TWICE A DAY BY ORAL ROUTE AS DIRECTED FOR 30 DAYS.     levothyroxine (SYNTHROID) 125 MCG tablet TAKE 1 TABLET BY MOUTH EVERY DAY 90 tablet 1   metoprolol succinate (TOPROL XL) 25 MG 24 hr tablet Take 0.5 tablets (12.5 mg total) by mouth daily. 15 tablet 6   nortriptyline (PAMELOR) 50 MG capsule TAKE 2 CAPSULES (100 MG TOTAL) BY MOUTH AT BEDTIME. 180 capsule 0   predniSONE (DELTASONE) 10 MG tablet Take by mouth as directed.     topiramate (TOPAMAX) 50 MG tablet TAKE 1 TABLET BY MOUTH EVERYDAY AT BEDTIME 90 tablet 3   No current facility-administered medications on file prior to visit.     Review of Systems  Constitutional:  Negative for chills and fever.  HENT:  Negative for congestion, ear pain, sinus pressure, sinus pain and sore throat.   Respiratory:  Positive for chest tightness (tightness across chest, can not take a deep breath) and shortness of breath (all the time). Negative for cough and wheezing.   Cardiovascular:  Negative for chest pain and palpitations.  Gastrointestinal:        No gerd  Neurological:  Positive  for headaches (mild, a little). Negative for dizziness and light-headedness.       Objective:   Vitals:   09/05/22 1445  BP: 120/76  Pulse: 87  Temp: 98.9 F (37.2 C)  SpO2: 99%   BP Readings from Last 3 Encounters:  09/05/22 120/76  08/11/22 106/71  07/24/22 114/72   Wt Readings from Last 3 Encounters:  09/05/22 138 lb (62.6 kg)  08/11/22 136 lb (61.7 kg)  07/24/22 137 lb (62.1 kg)   Body mass index is 25.24 kg/m.    Physical Exam     Lab Results  Component Value Date   WBC 10.1 04/02/2021   HGB 12.0 04/02/2021   HCT 36.3 04/02/2021   PLT 260.0 04/02/2021   GLUCOSE 98 07/25/2022   CHOL 209 (H) 07/25/2022   TRIG 90 07/25/2022   HDL 58 07/25/2022   LDLDIRECT 145.1 06/29/2013   LDLCALC 133 (H) 07/25/2022   ALT 18 07/25/2022   AST 24 07/25/2022   NA 137 07/25/2022   K 4.2 07/25/2022   CL 109 07/25/2022   CREATININE 0.97 07/25/2022   BUN 9 07/25/2022   CO2 22 07/25/2022   TSH 0.84 04/02/2021   INR 1.02 07/04/2011   HGBA1C 5.9 04/02/2021     Assessment & Plan:    See Problem List  for Assessment and Plan of chronic medical problems.

## 2022-09-04 NOTE — Patient Instructions (Addendum)
      Blood work was ordered.   The lab is on the first floor.    Medications changes include :   prednisone 40 mg daily for 5 days      Return in about 1 year (around 09/06/2023) for Physical Exam.

## 2022-09-05 ENCOUNTER — Ambulatory Visit: Payer: 59 | Admitting: Internal Medicine

## 2022-09-05 ENCOUNTER — Encounter: Payer: Self-pay | Admitting: Internal Medicine

## 2022-09-05 VITALS — BP 120/76 | HR 87 | Temp 98.9°F | Ht 62.0 in | Wt 138.0 lb

## 2022-09-05 DIAGNOSIS — E039 Hypothyroidism, unspecified: Secondary | ICD-10-CM

## 2022-09-05 DIAGNOSIS — G43809 Other migraine, not intractable, without status migrainosus: Secondary | ICD-10-CM

## 2022-09-05 DIAGNOSIS — R0789 Other chest pain: Secondary | ICD-10-CM | POA: Diagnosis not present

## 2022-09-05 DIAGNOSIS — Z87410 Personal history of cervical dysplasia: Secondary | ICD-10-CM | POA: Insufficient documentation

## 2022-09-05 LAB — TSH: TSH: 0.39 u[IU]/mL (ref 0.35–5.50)

## 2022-09-05 MED ORDER — NORTRIPTYLINE HCL 50 MG PO CAPS: ORAL_CAPSULE | ORAL | 3 refills | Status: DC

## 2022-09-05 MED ORDER — PREDNISONE 20 MG PO TABS: 40.0000 mg | ORAL_TABLET | Freq: Every day | ORAL | 0 refills | Status: AC

## 2022-09-05 NOTE — Assessment & Plan Note (Signed)
Chronic Controlled Continue nortriptyline 100 mg nightly, Topamax 50 mg nightly

## 2022-09-05 NOTE — Assessment & Plan Note (Signed)
Chronic  Clinically euthyroid Check tsh and will titrate med dose if needed Currently taking levothyroxine 125 mcg daily

## 2022-09-06 DIAGNOSIS — R0789 Other chest pain: Secondary | ICD-10-CM | POA: Insufficient documentation

## 2022-09-06 NOTE — Assessment & Plan Note (Signed)
New Having chest tightness, can not take a deep breath/ SOB w/o other cold symptoms No improvement after prednisone, zpak Will try prednisone 40 mg qd x 5 days use albuterol as needed Not cardiac - had Ct CAC recently ? Related to metoprolol  Denies GERD Will let me know if there is no improvement

## 2022-09-16 ENCOUNTER — Ambulatory Visit: Payer: 59 | Attending: Nurse Practitioner | Admitting: Nurse Practitioner

## 2022-09-16 ENCOUNTER — Encounter: Payer: Self-pay | Admitting: Nurse Practitioner

## 2022-09-16 VITALS — BP 100/80 | HR 94 | Ht 62.5 in | Wt 136.0 lb

## 2022-09-16 DIAGNOSIS — I2510 Atherosclerotic heart disease of native coronary artery without angina pectoris: Secondary | ICD-10-CM | POA: Diagnosis not present

## 2022-09-16 DIAGNOSIS — R002 Palpitations: Secondary | ICD-10-CM | POA: Diagnosis not present

## 2022-09-16 DIAGNOSIS — R000 Tachycardia, unspecified: Secondary | ICD-10-CM | POA: Diagnosis not present

## 2022-09-16 DIAGNOSIS — I2584 Coronary atherosclerosis due to calcified coronary lesion: Secondary | ICD-10-CM

## 2022-09-16 DIAGNOSIS — E785 Hyperlipidemia, unspecified: Secondary | ICD-10-CM | POA: Diagnosis not present

## 2022-09-16 DIAGNOSIS — I340 Nonrheumatic mitral (valve) insufficiency: Secondary | ICD-10-CM

## 2022-09-16 MED ORDER — METOPROLOL TARTRATE 25 MG PO TABS: 12.5000 mg | ORAL_TABLET | Freq: Two times a day (BID) | ORAL | 1 refills | Status: DC

## 2022-09-16 MED ORDER — ASPIRIN 81 MG PO TBEC: 81.0000 mg | DELAYED_RELEASE_TABLET | Freq: Every day | ORAL | 1 refills | Status: AC

## 2022-09-16 NOTE — Patient Instructions (Addendum)
Medication Instructions:  Your physician has recommended you make the following change in your medication:  Start aspirin 81 mg once a day Start metoprolol tartrate (lopressor) 12.5 mg twice a day Stop metoprolol succinate (Toprol) Continue all other medications as directed  Labwork: none  Testing/Procedures: none  Follow-Up:  Your physician recommends that you schedule a follow-up appointment in: 6 months  Any Other Special Instructions Will Be Listed Below (If Applicable).  If you need a refill on your cardiac medications before your next appointment, please call your pharmacy.

## 2022-09-16 NOTE — Progress Notes (Signed)
Cardiology Office Note:    Date:  09/16/2022  ID:  Melissa Kirby, DOB [birth year], MRN [id number]  PCP:  Binnie Rail, MD   [place] [facility] Cardiologist:  Carlyle Dolly, MD     Referring MD: Binnie Rail, MD   CC: Here for follow-up  History of Present Illness:    Melissa Kirby is a 62 y.o. female with a hx of the following:  Coronary artery calcification  History of palpitations Hypertension History of chest pain History of COVID-19 Migraines (sees Neurology) History of intracranial hypertension, status post procedure Thyroid disease, status post total thyroidectomy  Patient is a delightful 62 year old female with past medical history as mentioned above.  Was referred to cardiology services in 2021.  Diagnosed with COVID prior to 2021 and had ongoing symptoms of palpitations since.  Also noted some shortness of breath with episodes occasionally.  Denied any coffee, sodas, or alcohol use.  Did admit to squeezing chest pain sensation, not always associated with exertion, not positional, occurred daily at the time.  She reported she had a stress test around [id number].  Dr. Harl Bowie stated would not plan on doing stress testing at the time due to her atypical symptoms.  Mother had an MI at age 69.  7-day event monitor was arranged and was unremarkable. Echocardiogram showed normal EF, mild LVH, mild MR, no other significant valvular abnormalities. She had previously taken lisinopril 5 mg daily for elevated blood pressure, had hypotension with lisinopril, so medication was discontinued.  Dr. Harl Bowie stated to follow home BPs for the time and hold lisinopril would reevaluate at next visit.  Was told to follow-up in 6 weeks.  07/24/2022 - Saw her for follow-up. Was referred by her PCP due to the fact that she had a CT cardiac scoring test performed that showed mild CAD.  Coronary calcium score 3.22, 66th percentile for her demographic. Significant family history of CAD and  cardiovascular disease. Admitted to palpitations occasionally. Echo was unremarkable, no significant valvular abnormalities.   09/16/2022 - Today she presents for follow-up.  Doing well.  Denies any changes to last office visit.  Does bring in blood pressure and heart rate log since last office visit.  BP well-controlled.  Heart rate on the upper end of normal, with heart rate going up to 117.  VS checked at work. Denies any chest pain, shortness of breath, syncope, presyncope, dizziness, orthopnea, PND, swelling or significant weight changes, acute bleeding, or claudication.  She has been adhering to [place] and increasing her exercise regimen.  She is weaning off caffeine, only drinking 1 cup of coffee per day.  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 - does not take any meds.     Kidney stones 1998, 2005   x 2   MIGRAINE HEADACHE    last Migraine 06/29/2011-    Unspecified hypothyroidism 1982/2000   surgical resection of benign growth - partial then complete   VITAMIN B12 DEFICIENCY 09/2009 dx    Past Surgical History:  Procedure Laterality Date  CHOLECYSTECTOMY  2009   DIAGNOSTIC LAPAROSCOPY  1982   diagnostic   POSTERIOR FUSION LUMBAR SPINE  06/2011   THYROID SURGERY  1983   partial    TOTAL THYROIDECTOMY  2000   Had thyroid nodule, nonmalignant (1982-2000) resection   TUBAL LIGATION  1989   Current Meds  Medication Sig   albuterol (VENTOLIN HFA) 108 (90 Base) MCG/ACT inhaler Inhale 2 puffs into the lungs every 6 (six) hours as needed for wheezing or shortness of breath.   atorvastatin (LIPITOR) 20 MG tablet  Take 1 tablet (20 mg total) by mouth daily.   levothyroxine (SYNTHROID) 125 MCG tablet TAKE 1 TABLET BY MOUTH EVERY DAY   nortriptyline (PAMELOR) 50 MG capsule TAKE 2 CAPSULES (100 MG TOTAL) BY MOUTH AT BEDTIME.   topiramate (TOPAMAX) 50 MG tablet TAKE 1 TABLET BY MOUTH EVERYDAY AT BEDTIME    metoprolol succinate (TOPROL XL) 25 MG 24 hr tablet Take 0.5 tablets (12.5 mg total) by mouth daily.    Allergies:   Compazine, Metoclopramide hcl, Prochlorperazine edisylate, Gluten meal, Buspirone, Prozac [fluoxetine hcl], Tramadol, and Morphine and related   Social History   Socioeconomic History   Marital status: Divorced    Spouse name: Not on file   Number of children: 3   Years of education: Not on file   Highest education level: Not on file  Occupational History   Not on file  Tobacco Use   Smoking status: Never   Smokeless tobacco: Never  Vaping Use   Vaping Use: Never used  Substance and Sexual Activity   Alcohol use: No    Alcohol/week: 0.0 standard drinks of alcohol   Drug use: No   Sexual activity: Yes    Birth control/protection: Surgical    Comment: Married lives with spouse, 3 children. 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 Lives alone     Works at [employer] and [employer].  Education: high school 12th grade.      Exercises regularly      Right Handed   Lives in a two story home that has a basement   Drink Dr. Malachi Bonds   Social Determinants of Health   Financial Resource Strain: Not on file  Food Insecurity: Not on file  Transportation Needs: Not on file  Physical Activity: Not on file  Stress: Not on file  Social Connections: Not on file     Family History: The patient's family history includes Breast cancer in her maternal grandmother; COPD in her father; Coronary artery disease (age of onset: 28) in her maternal grandmother; Heart  disease (age of onset: 61) in her mother; Hyperlipidemia in her father, maternal grandmother, and mother; Ovarian cancer in an other family member. There is no history of Anesthesia problems, Malignant hyperthermia, Pseudochol deficiency, or Hypotension.  ROS:   Review of Systems  Constitutional: Negative.   HENT: Negative.    Eyes: Negative.   Respiratory: Negative.    Cardiovascular:  Positive for palpitations. Negative for chest pain, orthopnea, claudication, leg swelling and PND.  Gastrointestinal: Negative.   Genitourinary: Negative.   Musculoskeletal: Negative.   Skin: Negative.   Neurological:  Positive for headaches. Negative for dizziness, tingling, tremors, sensory change, speech change, focal weakness, seizures, loss of consciousness and weakness.       Hx of migraines. See HPI.   Endo/Heme/Allergies: Negative.   Psychiatric/Behavioral: Negative.  Please see the history of present illness.    All other systems reviewed and are negative.  EKGs/Labs/Other Studies Reviewed:    The following studies were reviewed today:   EKG:  EKG is ordered today.  The ekg ordered today demonstrates ST, 102 bpm, without acute ischemic changes.   Echocardiogram on 08/03/2022:   1. Left ventricular ejection fraction, by estimation, is 60 to 65%. The  left ventricle has normal function. The left ventricle has no regional  wall motion abnormalities. Left ventricular diastolic parameters are  indeterminate. The average left  ventricular global longitudinal strain is -20.2 %. The global longitudinal  strain is normal.   2. Right ventricular systolic function is normal. The right ventricular  size is normal. Tricuspid regurgitation signal is inadequate for assessing  PA pressure.   3. The mitral valve is normal in structure. Trivial mitral valve  regurgitation. No evidence of mitral stenosis.   4. The aortic valve is tricuspid. Aortic valve regurgitation is not  visualized. No aortic  stenosis is present.   5. The inferior vena cava is normal in size with greater than 50%  respiratory variability, suggesting right atrial pressure of 3 mmHg.   Comparison(s): Echocardiogram done 08/25/19 showed an EF of 60-65%.  Cardiac CT scoring on 06/30/2022: Coronary calcium score is 3.22 and this is that percentile 66 for subjects of the same age, gender, and ethnicity.  Echocardiogram on 08/25/2019:  1. Left ventricular ejection fraction, by visual estimation, is 60 to  65%. The left ventricle has normal function. There is mildly increased  left ventricular hypertrophy.   2. Left ventricular diastolic parameters are indeterminate.   3. The left ventricle has no regional wall motion abnormalities.   4. Global right ventricle has normal systolic function.The right  ventricular size is normal. No increase in right ventricular wall  thickness.   5. Left atrial size was normal.   6. Right atrial size was normal.   7. The mitral valve is mildly thickened. Mild mitral valve regurgitation.   8. The tricuspid valve is grossly normal. Tricuspid valve regurgitation  is trivial.   9. The aortic valve is tricuspid. Aortic valve regurgitation is trivial.  10. The pulmonic valve was grossly normal. Pulmonic valve regurgitation is  trivial.  11. TR signal is inadequate for assessing pulmonary artery systolic  pressure.  12. The inferior vena cava is normal in size with greater than 50%  respiratory variability, suggesting right atrial pressure of 3 mmHg.    Cardiac monitor on 09/12/2019: 7 day event monitor Min HR 73, Max HR 142, Avg HR 98 No symptoms reported Telemetry tracings show sinus rhythm and sinus tachycardia, isolated PVC No significant arrhythmias  Recent Labs: 07/25/2022: ALT 18; BUN 9; Creatinine, Ser 0.97; Potassium 4.2; Sodium 137 09/05/2022: TSH 0.39  Recent Lipid Panel    Component Value Date/Time   CHOL 209 (H) 07/25/2022 0809   TRIG 90 07/25/2022 0809   HDL 58  07/25/2022 0809   CHOLHDL 3.6 07/25/2022 0809   VLDL 18 07/25/2022 0809   LDLCALC 133 (H) 07/25/2022 0809   LDLDIRECT 145.1 06/29/2013 1051     Risk Assessment/Calculations:       The 10-year ASCVD risk score (Arnett DK, et al., 2019) is: 2.7%   Values used to calculate the score:     Age: 24 years     Sex: Female     Is Non-Hispanic African American: No     Diabetic: No     Tobacco smoker:  No     Systolic Blood Pressure: 123XX123 mmHg     Is BP treated: Yes     HDL Cholesterol: 58 mg/dL     Total Cholesterol: 209 mg/dL       Physical Exam:    VS:  BP 100/80   Pulse 94   Ht 5' 2.5" (1.588 m)   Wt 136 lb (61.7 kg)   SpO2 98%   BMI 24.48 kg/m     Wt Readings from Last 3 Encounters:  09/16/22 136 lb (61.7 kg)  09/05/22 138 lb (62.6 kg)  08/11/22 136 lb (61.7 kg)     GEN: Well nourished, well developed in no acute distress HEENT: Normal NECK: No JVD; No carotid bruits CARDIAC: S1/S2, regular rhythm and fast rate, no murmurs, rubs, gallops; 2+ pulses throughout RESPIRATORY:  Clear to auscultation without rales, wheezing or rhonchi  MUSCULOSKELETAL:  No edema; No deformity  SKIN: Warm and dry NEUROLOGIC:  Alert and oriented x 3 PSYCHIATRIC:  Normal affect   ASSESSMENT:    1. Coronary artery calcification   2. Hyperlipidemia, unspecified hyperlipidemia type   3. Palpitations   4. Tachycardia   5. Nonrheumatic mitral valve regurgitation     PLAN:    In order of problems listed above:  Coronary artery calcification Coronary calcium score 3.22 as seen on previous CT cardiac scoring, particularly noted along LAD. Family history of CVD. Stable with no anginal symptoms. No indication for ischemic evaluation. Will start Aspirin 81 mg daily. Will switch Toprol XL to Lopressor as mentioned below.  Heart healthy diet and regular cardiovascular exercise encouraged.   HLD Lipid panel 07/2022 revealed LDL level at 133. Started on low dose Atorvastatin, tolerating well. Goal  LDL is less than 100.  Will recheck FLP and CMET next month. Heart healthy diet and regular cardiovascular exercise encouraged.   Palpitations, ST Does admit to occasional palpitations, similar to last OV. HR today is 94 and 117 on HR log sheet. Monitor in 2021 unremarkable. Will stop Toprol XL and start Lopressor 12.5 mg BID. Heart healthy diet and regular cardiovascular exercise encouraged. If no improvement in palpitations by next visit, consider arranging a monitor.   Mild MR Recent Echo overall unremarkable, stable mild MR. Heart healthy diet and regular cardiovascular exercise encouraged. Continue to follow with PCP. Recommend repeating Echo in 3-5 years or sooner if clinically indicated.   5. Disposition: Follow up with Dr. Carlyle Dolly in 6 months or sooner if anything changes.   Medication Adjustments/Labs and Tests Ordered: Current medicines are reviewed at length with the patient today.  Concerns regarding medicines are outlined above.  No orders of the defined types were placed in this encounter.  Meds ordered this encounter  Medications   metoprolol tartrate (LOPRESSOR) 25 MG tablet    Sig: Take 0.5 tablets (12.5 mg total) by mouth 2 (two) times daily.    Dispense:  90 tablet    Refill:  1    09/16/22 New Start   aspirin EC 81 MG tablet    Sig: Take 1 tablet (81 mg total) by mouth daily. Swallow whole.    Dispense:  90 tablet    Refill:  1    09/16/24 New Start    Patient Instructions  Medication Instructions:  Your physician has recommended you make the following change in your medication:  Start aspirin 81 mg once a day Start metoprolol tartrate (lopressor) 12.5 mg twice a day Stop metoprolol succinate (Toprol) Continue all other medications as  directed  Labwork: none  Testing/Procedures: none  Follow-Up:  Your physician recommends that you schedule a follow-up appointment in: 6 months  Any Other Special Instructions Will Be Listed Below (If  Applicable).  If you need a refill on your cardiac medications before your next appointment, please call your pharmacy.    Signed, Finis Bud, NP  09/16/2022 8:57 AM    [place]

## 2022-09-20 ENCOUNTER — Other Ambulatory Visit: Payer: Self-pay | Admitting: Internal Medicine

## 2022-09-30 ENCOUNTER — Other Ambulatory Visit (HOSPITAL_COMMUNITY)
Admission: RE | Admit: 2022-09-30 | Discharge: 2022-09-30 | Disposition: A | Discharge status: Home / Self Care | Payer: 59 | Source: Ambulatory Visit | Attending: Nurse Practitioner | Admitting: Nurse Practitioner

## 2022-09-30 DIAGNOSIS — E785 Hyperlipidemia, unspecified: Secondary | ICD-10-CM | POA: Insufficient documentation

## 2022-09-30 DIAGNOSIS — Z79899 Other long term (current) drug therapy: Secondary | ICD-10-CM | POA: Insufficient documentation

## 2022-09-30 LAB — LIPID PANEL
Cholesterol: 152 mg/dL (ref 0–200)
HDL: 55 mg/dL (ref >40)
LDL Cholesterol: 87 mg/dL (ref 0–99)
Total CHOL/HDL Ratio: 2.8 RATIO
Triglycerides: 52 mg/dL (ref <150)
VLDL: 10 mg/dL (ref 0–40)

## 2022-09-30 LAB — HEPATIC FUNCTION PANEL
ALT: 19 U/L (ref 0–44)
AST: 23 U/L (ref 15–41)
Albumin: 3.8 g/dL (ref 3.5–5.0)
Alkaline Phosphatase: 138 U/L — ABNORMAL HIGH (ref 38–126)
Bilirubin, Direct: 0.1 mg/dL (ref 0.0–0.2)
Indirect Bilirubin: 0.6 mg/dL (ref 0.3–0.9)
Total Bilirubin: 0.7 mg/dL (ref 0.3–1.2)
Total Protein: 7.3 g/dL (ref 6.5–8.1)

## 2022-10-15 ENCOUNTER — Encounter: Payer: Self-pay | Admitting: Internal Medicine

## 2022-10-15 NOTE — Progress Notes (Signed)
Outside notes received. Information abstracted. Notes sent to scan.  

## 2023-01-15 ENCOUNTER — Other Ambulatory Visit: Payer: Self-pay | Admitting: Nurse Practitioner

## 2023-02-01 ENCOUNTER — Other Ambulatory Visit: Payer: Self-pay | Admitting: Nurse Practitioner

## 2023-03-10 ENCOUNTER — Ambulatory Visit: Payer: 59 | Admitting: Cardiology

## 2023-03-13 ENCOUNTER — Encounter: Payer: Self-pay | Admitting: Cardiology

## 2023-03-13 ENCOUNTER — Ambulatory Visit: Payer: 59 | Admitting: Cardiology

## 2023-03-13 VITALS — BP 110/65 | HR 103 | Ht 63.0 in | Wt 138.0 lb

## 2023-03-13 DIAGNOSIS — I2584 Coronary atherosclerosis due to calcified coronary lesion: Secondary | ICD-10-CM

## 2023-03-13 DIAGNOSIS — I2510 Atherosclerotic heart disease of native coronary artery without angina pectoris: Secondary | ICD-10-CM

## 2023-03-13 DIAGNOSIS — R002 Palpitations: Secondary | ICD-10-CM | POA: Diagnosis not present

## 2023-03-13 MED ORDER — METOPROLOL SUCCINATE ER 25 MG PO TB24: 12.5000 mg | ORAL_TABLET | Freq: Every day | ORAL | Status: DC

## 2023-03-13 NOTE — Progress Notes (Signed)
Clinical Summary Melissa Kirby is a 62 y.o.female seen today for follow up of the following medical problems.    1. Palpitations/Chest pain/SOB - symptoms started after a prior covid infection  - recent benign event monitor and echo - started on inhaler by pcp, with use her SOB and palpitations have imrpoved.   - Jan 2024 echo: LVEF 60-65%, no WMAs, trivial MR - rare palpitations.  - one Dr pepper a day as far as caffeine intake - compliant with toprol 12.5mg  daily    2. Coronary calcification - mildly elevated coronary calcuim score of 3 - LDL goal would be <100 09/2022 TC 152 TG 52 HDL 55 LDL 87    SH: works at [employer] Past Medical History:  Diagnosis Date   ANEMIA, IRON DEFICIENCY    Cerebrospinal fluid leak from spinal puncture    improved with 7th patch at [facility] 05/2014   Depression    therapy   Fibromyalgia 1999   no meds   GASTRITIS 11/12/2009   Qualifier: Diagnosis of  By: Theadora Rama RN, Silvio Pate     GERD (gastroesophageal reflux disease)    per endo. - does not take any meds.     Kidney stones 1998, 2005   x 2   MIGRAINE HEADACHE    last Migraine 06/29/2011-    Unspecified hypothyroidism 1982/2000   surgical resection of benign growth - partial then complete   VITAMIN B12 DEFICIENCY 09/2009 dx     Allergies  Allergen Reactions   Compazine Anaphylaxis    Hypotension   Metoclopramide Hcl Anaphylaxis    Hypotension   Prochlorperazine Edisylate     Other reaction(s): Other (See Comments) Hemodynamic instability   Gluten Meal Other (See Comments)    Vitamin B irregulaties   Buspirone     nausea   Prozac [Fluoxetine Hcl]     Increased anxiety   Tramadol Nausea Only   Morphine And Codeine Nausea Only     Current Outpatient Medications  Medication Sig Dispense Refill   albuterol (VENTOLIN HFA) 108 (90 Base) MCG/ACT inhaler Inhale 2 puffs into the lungs every 6 (six) hours as needed for wheezing or shortness of breath. 6.7 g 5   aspirin EC 81 MG  tablet Take 1 tablet (81 mg total) by mouth daily. Swallow whole. 90 tablet 1   atorvastatin (LIPITOR) 20 MG tablet TAKE 1 TABLET BY MOUTH EVERY DAY 90 tablet 2   levothyroxine (SYNTHROID) 125 MCG tablet TAKE 1 TABLET BY MOUTH EVERY DAY 90 tablet 1   metoprolol succinate (TOPROL-XL) 25 MG 24 hr tablet TAKE 1/2 TABLET BY MOUTH EVERY DAY 45 tablet 2   metoprolol tartrate (LOPRESSOR) 25 MG tablet Take 0.5 tablets (12.5 mg total) by mouth 2 (two) times daily. 90 tablet 1   nortriptyline (PAMELOR) 50 MG capsule TAKE 2 CAPSULES (100 MG TOTAL) BY MOUTH AT BEDTIME. 180 capsule 3   topiramate (TOPAMAX) 50 MG tablet TAKE 1 TABLET BY MOUTH EVERYDAY AT BEDTIME 90 tablet 3   No current facility-administered medications for this visit.     Past Surgical History:  Procedure Laterality Date   CHOLECYSTECTOMY  2009   DIAGNOSTIC LAPAROSCOPY  1982   diagnostic   POSTERIOR FUSION LUMBAR SPINE  06/2011   THYROID SURGERY  1983   partial    TOTAL THYROIDECTOMY  2000   Had thyroid nodule, nonmalignant (1982-2000) resection   TUBAL LIGATION  1989     Allergies  Allergen Reactions   Compazine Anaphylaxis  Hypotension   Metoclopramide Hcl Anaphylaxis    Hypotension   Prochlorperazine Edisylate     Other reaction(s): Other (See Comments) Hemodynamic instability   Gluten Meal Other (See Comments)    Vitamin B irregulaties   Buspirone     nausea   Prozac [Fluoxetine Hcl]     Increased anxiety   Tramadol Nausea Only   Morphine And Codeine Nausea Only      Family History  Problem Relation Age of Onset   Hyperlipidemia Mother    Heart disease Mother 62       massive MI   Hyperlipidemia Father    COPD Father        severe, smoker   Breast cancer Maternal Grandmother    Hyperlipidemia Maternal Grandmother    Coronary artery disease Maternal Grandmother 15       CABG   Ovarian cancer Other    Anesthesia problems Neg Hx    Malignant hyperthermia Neg Hx    Pseudochol deficiency Neg Hx     Hypotension Neg Hx      Social History Ms. Cacal reports that she has never smoked. She has never used smokeless tobacco. Ms. Armenteros reports no history of alcohol use.   Review of Systems CONSTITUTIONAL: No weight loss, fever, chills, weakness or fatigue.  HEENT: Eyes: No visual loss, blurred vision, double vision or yellow sclerae.No hearing loss, sneezing, congestion, runny nose or sore throat.  SKIN: No rash or itching.  CARDIOVASCULAR: per hpi RESPIRATORY: No shortness of breath, cough or sputum.  GASTROINTESTINAL: No anorexia, nausea, vomiting or diarrhea. No abdominal pain or blood.  GENITOURINARY: No burning on urination, no polyuria NEUROLOGICAL: No headache, dizziness, syncope, paralysis, ataxia, numbness or tingling in the extremities. No change in bowel or bladder control.  MUSCULOSKELETAL: No muscle, back pain, joint pain or stiffness.  LYMPHATICS: No enlarged nodes. No history of splenectomy.  PSYCHIATRIC: No history of depression or anxiety.  ENDOCRINOLOGIC: No reports of sweating, cold or heat intolerance. No polyuria or polydipsia.  Marland Kitchen   Physical Examination Today's Vitals   03/13/23 1431  BP: 130/88  Pulse: (!) 103  SpO2: 98%  Weight: 138 lb (62.6 kg)  Height: 5\' 3"  (1.6 m)   Body mass index is 24.45 kg/m.  Gen: resting comfortably, no acute distress HEENT: no scleral icterus, pupils equal round and reactive, no palptable cervical adenopathy,  CV: RRR, no mrg, no jvd Resp: Clear to auscultation bilaterally GI: abdomen is soft, non-tender, non-distended, normal bowel sounds, no hepatosplenomegaly MSK: extremities are warm, no edema.  Skin: warm, no rash Neuro:  no focal deficits Psych: appropriate affect   Diagnostic Studies Jan 2021 monitor 7 day event monitor Min HR 73, Max HR 142, Avg HR 98 No symptoms reported Telemetry tracings show sinus rhythm and sinus tachycardia, isolated PVC No significant arrhythmias     08/2019 echo: LVEF 60-65%,  indet DDX, mild MR,    Assessment and Plan  1. Palpitations/Chest pain/SOB - symptoms started after a prior COVID infection - overall benign workup with heart monitor, echo - SOB has improved with prn albuterol, palpitations overall controlled with low dose toprol - continue current meds   2. Coronary calcification - continue current medical therapy and risk factor modification   F/u 1 year      Antoine Poche, M.D.

## 2023-03-13 NOTE — Patient Instructions (Signed)
Medication Instructions:  Your physician recommends that you continue on your current medications as directed. Please refer to the Current Medication list given to you today.  *If you need a refill on your cardiac medications before your next appointment, please call your pharmacy*   Lab Work: None If you have labs (blood work) drawn today and your tests are completely normal, you will receive your results only by: MyChart Message (if you have MyChart) OR A paper copy in the mail If you have any lab test that is abnormal or we need to change your treatment, we will call you to review the results.   Testing/Procedures: None   Follow-Up: At [facility], you and your health needs are our priority.  As part of our continuing mission to provide you with exceptional heart care, we have created designated Provider Care Teams.  These Care Teams include your primary Cardiologist (physician) and Advanced Practice Providers (APPs -  Physician Assistants and Nurse Practitioners) who all work together to provide you with the care you need, when you need it.  We recommend signing up for the patient portal called "MyChart".  Sign up information is provided on this After Visit Summary.  MyChart is used to connect with patients for Virtual Visits (Telemedicine).  Patients are able to view lab/test results, encounter notes, upcoming appointments, etc.  Non-urgent messages can be sent to your provider as well.   To learn more about what you can do with MyChart, go to [email or web address].    Your next appointment:   1 year(s)  Provider:   Jonathan Branch, MD    Other Instructions    

## 2023-03-15 ENCOUNTER — Other Ambulatory Visit: Payer: Self-pay | Admitting: Nurse Practitioner

## 2023-05-28 ENCOUNTER — Other Ambulatory Visit: Payer: Self-pay | Admitting: Internal Medicine

## 2023-07-29 ENCOUNTER — Other Ambulatory Visit: Payer: Self-pay | Admitting: Internal Medicine

## 2023-08-11 NOTE — Progress Notes (Unsigned)
NEUROLOGY FOLLOW UP OFFICE NOTE  Melissa Kirby [id number]  Assessment/Plan:   Primary stabbing headache History of spontaneous intracranial hypotension   Topiramate 50mg  at bedtime *** Follow up in one year ***     Subjective:  Melissa Kirby is a 63 year old right-handed woman with history of migraine, B12 deficiency, hypothyroidism, kidney stones, depression, and history of idiopathic intracranial hypotension and lumbar laminectomy and fusion who follows up for headache.  UPDATE:  Doing well.  No migraines.  No other head pain.     Current NSAIDS/analgesics:  none Current triptans:  none Current ergotamine:  none Current anti-emetic:  none Current muscle relaxants:  none Current Antihypertensive medications:  none Current Antidepressant medications:  nortriptyline 100mg  QHS, sertraline 50mg  daily Current Anticonvulsant medications:  topiramate 50mg  QHS Current anti-CGRP:  none Current Vitamins/Herbal/Supplements:  none Current Antihistamines/Decongestants:  none Other therapy:  none Hormone/birth control:  none Other medications:  Synthroid     HISTORY: In January-February 2015, she developed a stabbing headache several times daily.  Valsalva, bending forward, coughing and sneezing would make it worse.  Laying supine helped.  MRI of the brain was performed on 10/13/13, which revealed inferior displacement of the cerebellum and brainstem, with flattening of the ventral pons and diminished suprasellar cistern volume, as well as mild circumferential dural thickening, findings which may be seen in intracranial hypotension.  She underwent lumbar puncture with opening pressure of 11 and closing pressure less than 9.  CSF cell count 6, glucose 58, protein 35, gram stain and culture negative.  MRI of the cervical, thoracic and lumbar spines with and without contrast on 11/23/13 did not reveal any post-contrast signal abnormality or location for CSF leak.  CT myelogram revealed possible  epidural leak on the right at T7-T8.  She underwent a blood patch on 01/13/14, and she was headache-free for almost 3 months but then the pain recurred.  She was referred to radiology at [facility] for evaluation and possible intervention.  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   She underwent gluing.  Repeat MRI of brain on 08/24/2015 was essentially normal for age without evidence of sagging of the brainstem or abnormal dural enhancement to suggest recurrent intracranial hypotension.   She had been doing well for quite some time.  In 2022, she started experiencing similar paroxysmal stabbing headaches in the front, lasting a couple of seconds.  During that moment, she has blackout of vision.  Initially they occurred once every couple of weeks, now at least once a week.  She also has significant tenderness to the left temple.  The back of head feels sore and tender to touch.  She hears her heartbeat in her head.  Coughing aggravates the frontal pain.  Headache is slightly better when laying flat on the bed without a pillow.  When she first gets up in the morning, she feels "weird" and nauseous.  She has had an eye exam that was reportedly unremarkable.  Workup for headache included labs on 07/17/2021 which revealed sed rate 43 and negative CRP.  MRI of brain with and without contrast on 08/04/2021 personally reviewed revealed nonspecific mild cerebral white matter signal changes likely chronic small vessel ischemic changes.    Started topiramate.     Migraines are well-controlled, particularly since starting a gluten-free diet.    Past NSAIDS/analgesics:  Fioricet, Mobic, tramadol Past abortive triptans:  none Past abortive ergotamine:  none Past muscle relaxants:  Robaxin, Skelaxin Past anti-emetic:  Zofran Past antihypertensive medications:  lisinopril Past antidepressant  medications:  venlafaxine, escitalopram, fluoxetine Past anticonvulsant medications:  gabapentin Past anti-CGRP:  none Past vitamins/Herbal/Supplements:  none Past antihistamines/decongestants:  Benadryl Other past therapies:  none  PAST MEDICAL HISTORY: Past Medical History:  Diagnosis Date   ANEMIA, IRON DEFICIENCY    Cerebrospinal fluid leak from spinal puncture    improved with 7th patch at [facility] 05/2014   Depression    therapy   Fibromyalgia 1999   no meds   GASTRITIS 11/12/2009   Qualifier: Diagnosis of  By: Theadora Rama RN, Silvio Pate     GERD (gastroesophageal reflux disease)    per endo. - does not take any meds.     Kidney stones 1998, 2005   x 2   MIGRAINE HEADACHE    last Migraine 06/29/2011-    Unspecified hypothyroidism 1982/2000   surgical resection of benign growth - partial then complete   VITAMIN B12 DEFICIENCY 09/2009 dx    MEDICATIONS: Current Outpatient Medications on File Prior to Visit  Medication Sig Dispense Refill   albuterol (VENTOLIN HFA) 108 (90 Base) MCG/ACT inhaler Inhale 2 puffs into the lungs every 6 (six) hours as needed for wheezing or shortness of breath. 6.7 g 5   aspirin EC 81 MG tablet Take 1 tablet (81 mg total) by mouth daily. Swallow whole. 90 tablet 1   atorvastatin (LIPITOR) 20 MG tablet TAKE 1 TABLET BY MOUTH EVERY DAY 90 tablet 2   levothyroxine (SYNTHROID) 125 MCG tablet TAKE 1 TABLET BY MOUTH EVERY DAY 90 tablet 1   methocarbamol (ROBAXIN) 500 MG tablet Take 500 mg by mouth 3 (three) times daily as needed.     metoprolol succinate (TOPROL-XL) 25 MG 24 hr tablet Take 0.5 tablets (12.5 mg total) by mouth daily.     nortriptyline (PAMELOR) 50 MG capsule TAKE 2 CAPSULES (100 MG TOTAL) BY MOUTH AT BEDTIME. 180 capsule 0   topiramate (TOPAMAX) 50 MG tablet TAKE 1 TABLET BY MOUTH EVERYDAY AT BEDTIME 90 tablet 3   No current facility-administered medications on file prior to visit.    ALLERGIES: Allergies  Allergen Reactions   Compazine  Anaphylaxis    Hypotension   Metoclopramide Hcl Anaphylaxis    Hypotension   Prochlorperazine Edisylate     Other reaction(s): Other (See Comments) Hemodynamic instability   Gluten Meal Other (See Comments)    Vitamin B irregulaties   Buspirone     nausea   Prozac [Fluoxetine Hcl]     Increased anxiety   Tramadol Nausea Only   Morphine And Codeine Nausea Only    FAMILY HISTORY: Family History  Problem Relation Age of Onset   Hyperlipidemia Mother    Heart disease Mother 82       massive MI   Hyperlipidemia Father    COPD Father        severe, smoker   Breast cancer Maternal Grandmother    Hyperlipidemia Maternal Grandmother    Coronary artery disease Maternal Grandmother 69       CABG   Ovarian cancer Other    Anesthesia problems Neg Hx    Malignant hyperthermia Neg Hx    Pseudochol deficiency Neg Hx    Hypotension Neg Hx       Objective:  *** General: No acute distress.  Patient appears well-groomed.   Head:  Normocephalic/atraumatic Eyes:  Fundi examined but not visualized Neck: supple, no paraspinal  tenderness, full range of motion Heart:  Regular rate and rhythm Neurological Exam:alert and oriented.  Speech fluent and not dysarthric, language intact.  CN II-XII intact. Bulk and tone normal, muscle strength 5/5 throughout.  Sensation to light touch intact.  Deep tendon reflexes 2+ throughout.  Finger to nose testing intact.  Gait normal, Romberg negative.   Melissa Millet, DO  CC: Cheryll Cockayne, MD

## 2023-08-12 ENCOUNTER — Ambulatory Visit: Payer: 59 | Admitting: Neurology

## 2023-08-12 ENCOUNTER — Encounter: Payer: Self-pay | Admitting: Neurology

## 2023-08-12 VITALS — BP 113/72 | HR 81 | Ht 63.0 in | Wt 140.0 lb

## 2023-08-12 DIAGNOSIS — G4485 Primary stabbing headache: Secondary | ICD-10-CM

## 2023-08-12 DIAGNOSIS — G96810 Intracranial hypotension, unspecified: Secondary | ICD-10-CM

## 2023-08-12 MED ORDER — TOPIRAMATE 50 MG PO TABS: ORAL_TABLET | ORAL | 3 refills | Status: DC

## 2023-09-09 ENCOUNTER — Other Ambulatory Visit: Payer: Self-pay | Admitting: Nurse Practitioner

## 2023-09-10 ENCOUNTER — Ambulatory Visit: Payer: 59 | Admitting: Internal Medicine

## 2023-09-20 NOTE — Patient Instructions (Addendum)

## 2023-09-20 NOTE — Progress Notes (Unsigned)
 Subjective:    Patient ID: Melissa Kirby, female    DOB: March 12, 1961, 63 y.o.   MRN: [id number]      HPI Melissa Kirby is here for a Physical exam and her chronic medical problems.    Doing well. No concerns.  Should be retiring this fall.    Medications and allergies reviewed with patient and updated if appropriate.  Current Outpatient Medications on File Prior to Visit  Medication Sig Dispense Refill   albuterol (VENTOLIN HFA) 108 (90 Base) MCG/ACT inhaler Inhale 2 puffs into the lungs every 6 (six) hours as needed for wheezing or shortness of breath. 6.7 g 5   aspirin EC 81 MG tablet Take 1 tablet (81 mg total) by mouth daily. Swallow whole. 90 tablet 1   atorvastatin (LIPITOR) 20 MG tablet TAKE 1 TABLET BY MOUTH EVERY DAY 90 tablet 2   levothyroxine (SYNTHROID) 125 MCG tablet TAKE 1 TABLET BY MOUTH EVERY DAY 90 tablet 1   methocarbamol (ROBAXIN) 500 MG tablet Take 500 mg by mouth 3 (three) times daily as needed.     metoprolol succinate (TOPROL-XL) 25 MG 24 hr tablet Take 0.5 tablets (12.5 mg total) by mouth daily.     nortriptyline (PAMELOR) 50 MG capsule TAKE 2 CAPSULES (100 MG TOTAL) BY MOUTH AT BEDTIME. 180 capsule 0   topiramate (TOPAMAX) 50 MG tablet TAKE 1 TABLET BY MOUTH EVERYDAY AT BEDTIME 90 tablet 3   No current facility-administered medications on file prior to visit.    Review of Systems  Constitutional:  Negative for fever.  Eyes:  Negative for visual disturbance.  Respiratory:  Negative for cough, shortness of breath and wheezing.   Cardiovascular:  Negative for chest pain, palpitations and leg swelling.  Gastrointestinal:  Negative for abdominal pain, blood in stool, constipation and diarrhea.       Occ gerd at night - drinks milk  Genitourinary:  Negative for dysuria.  Musculoskeletal:  Negative for arthralgias and back pain.  Skin:  Negative for rash.  Neurological:  Positive for headaches (migraines). Negative for dizziness, light-headedness and numbness.   Psychiatric/Behavioral:  Negative for dysphoric mood and sleep disturbance. The patient is not nervous/anxious.        Objective:   Vitals:   09/21/23 0738  BP: 108/78  Pulse: 76  Temp: 97.8 F (36.6 C)  SpO2: 96%   Filed Weights   09/21/23 0738  Weight: 144 lb (65.3 kg)   Body mass index is 25.51 kg/m.  BP Readings from Last 3 Encounters:  09/21/23 108/78  08/12/23 113/72  03/13/23 110/65    Wt Readings from Last 3 Encounters:  09/21/23 144 lb (65.3 kg)  08/12/23 140 lb (63.5 kg)  03/13/23 138 lb (62.6 kg)       Physical Exam Constitutional: She appears well-developed and well-nourished. No distress.  HENT:  Head: Normocephalic and atraumatic.  Right Ear: External ear normal. Normal ear canal and TM Left Ear: External ear normal.  Normal ear canal and TM Mouth/Throat: Oropharynx is clear and moist.  Eyes: Conjunctivae normal.  Neck: Neck supple. No tracheal deviation present. No thyromegaly present.  No carotid bruit  Cardiovascular: Normal rate, regular rhythm and normal heart sounds.   No murmur heard.  No edema. Pulmonary/Chest: Effort normal and breath sounds normal. No respiratory distress. She has no wheezes. She has no rales.  Breast: deferred   Abdominal: Soft. She exhibits no distension. There is no tenderness.  Lymphadenopathy: She has no cervical adenopathy.  Skin:  Skin is warm and dry. She is not diaphoretic.  Psychiatric: She has a normal mood and affect. Her behavior is normal.     Lab Results  Component Value Date   WBC 10.1 04/02/2021   HGB 12.0 04/02/2021   HCT 36.3 04/02/2021   PLT 260.0 04/02/2021   GLUCOSE 98 07/25/2022   CHOL 152 09/30/2022   TRIG 52 09/30/2022   HDL 55 09/30/2022   LDLDIRECT 145.1 06/29/2013   LDLCALC 87 09/30/2022   ALT 19 09/30/2022   AST 23 09/30/2022   NA 137 07/25/2022   K 4.2 07/25/2022   CL 109 07/25/2022   CREATININE 0.97 07/25/2022   BUN 9 07/25/2022   CO2 22 07/25/2022   TSH 0.39 09/05/2022    INR 1.02 07/04/2011   HGBA1C 5.9 04/02/2021         Assessment & Plan:   Physical exam: Screening blood work  ordered Exercise  does not exercise much during the winter -- started back walking track  Weight  normal Substance abuse  none   Reviewed recommended immunizations.   Health Maintenance  Topic Date Due   Zoster Vaccines- Shingrix (1 of 2) Never done   Colonoscopy  11/13/2019   COVID-19 Vaccine (3 - Pfizer risk series) 10/06/2023 (Originally 06/04/2020)   Pneumococcal Vaccine 28-40 Years old (1 of 2 - PCV) 09/20/2024 (Originally 04/09/1967)   MAMMOGRAM  03/06/2024   DTaP/Tdap/Td (2 - Td or Tdap) 08/29/2024   DEXA SCAN  03/06/2025   Cervical Cancer Screening (HPV/Pap Cotest)  08/11/2025   Hepatitis C Screening  Completed   HIV Screening  Completed   HPV VACCINES  Aged Out   INFLUENZA VACCINE  Discontinued          See Problem List for Assessment and Plan of chronic medical problems.

## 2023-09-21 ENCOUNTER — Encounter: Payer: Self-pay | Admitting: Internal Medicine

## 2023-09-21 ENCOUNTER — Ambulatory Visit: Payer: 59 | Admitting: Internal Medicine

## 2023-09-21 VITALS — BP 108/78 | HR 76 | Temp 97.8°F | Ht 63.0 in | Wt 144.0 lb

## 2023-09-21 DIAGNOSIS — Z0000 Encounter for general adult medical examination without abnormal findings: Secondary | ICD-10-CM | POA: Diagnosis not present

## 2023-09-21 DIAGNOSIS — R7303 Prediabetes: Secondary | ICD-10-CM | POA: Diagnosis not present

## 2023-09-21 DIAGNOSIS — E538 Deficiency of other specified B group vitamins: Secondary | ICD-10-CM

## 2023-09-21 DIAGNOSIS — Z1211 Encounter for screening for malignant neoplasm of colon: Secondary | ICD-10-CM

## 2023-09-21 DIAGNOSIS — G43809 Other migraine, not intractable, without status migrainosus: Secondary | ICD-10-CM

## 2023-09-21 DIAGNOSIS — E039 Hypothyroidism, unspecified: Secondary | ICD-10-CM | POA: Diagnosis not present

## 2023-09-21 DIAGNOSIS — E559 Vitamin D deficiency, unspecified: Secondary | ICD-10-CM

## 2023-09-21 LAB — CBC WITH DIFFERENTIAL/PLATELET
Basophils Absolute: 0 10*3/uL (ref 0.0–0.1)
Basophils Relative: 0.5 % (ref 0.0–3.0)
Eosinophils Absolute: 0.2 10*3/uL (ref 0.0–0.7)
Eosinophils Relative: 2.5 % (ref 0.0–5.0)
HCT: 39.4 % (ref 36.0–46.0)
Hemoglobin: 13 g/dL (ref 12.0–15.0)
Lymphocytes Relative: 33.7 % (ref 12.0–46.0)
Lymphs Abs: 2.1 10*3/uL (ref 0.7–4.0)
MCHC: 33.1 g/dL (ref 30.0–36.0)
MCV: 92.2 fl (ref 78.0–100.0)
Monocytes Absolute: 0.7 10*3/uL (ref 0.1–1.0)
Monocytes Relative: 11.3 % (ref 3.0–12.0)
Neutro Abs: 3.3 10*3/uL (ref 1.4–7.7)
Neutrophils Relative %: 52 % (ref 43.0–77.0)
Platelets: 250 10*3/uL (ref 150.0–400.0)
RBC: 4.27 Mil/uL (ref 3.87–5.11)
RDW: 13 % (ref 11.5–15.5)
WBC: 6.3 10*3/uL (ref 4.0–10.5)

## 2023-09-21 LAB — LIPID PANEL
Cholesterol: 131 mg/dL (ref 0–200)
HDL: 54.2 mg/dL (ref >39.00)
LDL Cholesterol: 55 mg/dL (ref 0–99)
NonHDL: 76.89
Total CHOL/HDL Ratio: 2
Triglycerides: 111 mg/dL (ref 0.0–149.0)
VLDL: 22.2 mg/dL (ref 0.0–40.0)

## 2023-09-21 LAB — COMPREHENSIVE METABOLIC PANEL
ALT: 20 U/L (ref 0–35)
AST: 21 U/L (ref 0–37)
Albumin: 4.1 g/dL (ref 3.5–5.2)
Alkaline Phosphatase: 157 U/L — ABNORMAL HIGH (ref 39–117)
BUN: 7 mg/dL (ref 6–23)
CO2: 23 meq/L (ref 19–32)
Calcium: 9.5 mg/dL (ref 8.4–10.5)
Chloride: 110 meq/L (ref 96–112)
Creatinine, Ser: 0.94 mg/dL (ref 0.40–1.20)
GFR: 65.52 mL/min (ref >60.00)
Glucose, Bld: 95 mg/dL (ref 70–99)
Potassium: 4.1 meq/L (ref 3.5–5.1)
Sodium: 141 meq/L (ref 135–145)
Total Bilirubin: 0.4 mg/dL (ref 0.2–1.2)
Total Protein: 7.6 g/dL (ref 6.0–8.3)

## 2023-09-21 LAB — HEMOGLOBIN A1C: Hgb A1c MFr Bld: 6 % (ref 4.6–6.5)

## 2023-09-21 LAB — VITAMIN D 25 HYDROXY (VIT D DEFICIENCY, FRACTURES): VITD: 30.71 ng/mL (ref 30.00–100.00)

## 2023-09-21 LAB — VITAMIN B12: Vitamin B-12: >1537 pg/mL — ABNORMAL HIGH (ref 211–911)

## 2023-09-21 LAB — TSH: TSH: 0.02 u[IU]/mL — ABNORMAL LOW (ref 0.35–5.50)

## 2023-09-21 MED ORDER — NORTRIPTYLINE HCL 50 MG PO CAPS: ORAL_CAPSULE | ORAL | 3 refills | Status: AC

## 2023-09-21 MED ORDER — LEVOTHYROXINE SODIUM 125 MCG PO TABS: 125.0000 ug | ORAL_TABLET | Freq: Every day | ORAL | 3 refills | Status: DC

## 2023-09-21 MED ORDER — LEVOTHYROXINE SODIUM 112 MCG PO TABS: 112.0000 ug | ORAL_TABLET | Freq: Every day | ORAL | 3 refills | Status: DC

## 2023-09-21 MED ORDER — ATORVASTATIN CALCIUM 20 MG PO TABS: 20.0000 mg | ORAL_TABLET | Freq: Every day | ORAL | 3 refills | Status: DC

## 2023-09-21 NOTE — Assessment & Plan Note (Signed)
Chronic  Clinically euthyroid Check tsh and will titrate med dose if needed Currently taking levothyroxine 125 mcg daily  

## 2023-09-21 NOTE — Assessment & Plan Note (Signed)
 Chronic Controlled Follows with neurology-Dr. Everlena Cooper Continue nortriptyline 100 mg nightly, Topamax 50 mg nightly

## 2023-09-21 NOTE — Assessment & Plan Note (Signed)
 Chronic B12 level

## 2023-09-21 NOTE — Assessment & Plan Note (Signed)
 Chronic Lab Results  Component Value Date   HGBA1C 5.9 04/02/2021   Check a1c Low sugar / carb diet Stressed regular exercise

## 2023-09-21 NOTE — Assessment & Plan Note (Signed)
Chronic Check vitamin D level 

## 2023-09-21 NOTE — Addendum Note (Signed)
 Addended by: Pincus Sanes on: 09/21/2023 12:34 PM   Modules accepted: Orders

## 2023-09-24 ENCOUNTER — Telehealth: Payer: Self-pay | Admitting: Cardiology

## 2023-09-24 MED ORDER — METOPROLOL SUCCINATE ER 25 MG PO TB24: 12.5000 mg | ORAL_TABLET | Freq: Every day | ORAL | 0 refills | Status: DC

## 2023-09-24 NOTE — Telephone Encounter (Signed)
 Medication refill for 30 days per pt's request sent to [facility] in [place].

## 2023-09-24 NOTE — Telephone Encounter (Signed)
*  STAT* If patient is at the pharmacy, call can be transferred to refill team.   1. Which medications need to be refilled? (please list name of each medication and dose if known) metoprolol succinate (TOPROL-XL) 25 MG 24 hr tablet  2. Which pharmacy/location (including street and city if local pharmacy) is medication to be sent to? [id number] - [place], [place] - [address]  3. Do they need a 30 day or 90 day supply?   30 day supply

## 2023-10-20 ENCOUNTER — Encounter: Payer: Self-pay | Admitting: Neurology

## 2024-02-11 ENCOUNTER — Other Ambulatory Visit: Payer: Self-pay | Admitting: Nurse Practitioner

## 2024-04-12 ENCOUNTER — Ambulatory Visit: Attending: Cardiology | Admitting: Cardiology

## 2024-04-12 ENCOUNTER — Encounter: Payer: Self-pay | Admitting: Cardiology

## 2024-04-12 VITALS — BP 110/82 | HR 96 | Ht 63.0 in | Wt 143.6 lb

## 2024-04-12 DIAGNOSIS — R002 Palpitations: Secondary | ICD-10-CM | POA: Diagnosis not present

## 2024-04-12 DIAGNOSIS — I2510 Atherosclerotic heart disease of native coronary artery without angina pectoris: Secondary | ICD-10-CM

## 2024-04-12 MED ORDER — METOPROLOL SUCCINATE ER 25 MG PO TB24: 12.5000 mg | ORAL_TABLET | Freq: Every day | ORAL | 3 refills | Status: AC

## 2024-04-12 MED ORDER — ATORVASTATIN CALCIUM 20 MG PO TABS: 20.0000 mg | ORAL_TABLET | Freq: Every day | ORAL | 3 refills | Status: DC

## 2024-04-12 NOTE — Patient Instructions (Signed)

## 2024-04-12 NOTE — Progress Notes (Signed)
 Clinical Summary Ms. Ridgeway is a 63 y.o.female seen today for follow up of the following medical problems.    1. Palpitations/Chest pain/SOB - symptoms started after a prior covid infection  - recent benign event monitor and echo - started on inhaler by pcp, with use her SOB and palpitations have imrpoved.   - Jan 2024 echo: LVEF 60-65%, no WMAs, trivial MR   - no recent symptoms. Has done very well on just low dose toprol      2. Coronary calcification - mildly elevated coronary calcuim score of 3 - she is on asa, statin - LDL goal would be <100 09/2022 TC 152 TG 52 HDL 55 LDL 87 - 09/2023 TC 868 TG 888 HDL 54 LDL 55 - no chest pains. Up to 4 miles a day on treadmill.      SH: works at [employer], planning on retirement in Feb 2026   Past Medical History:  Diagnosis Date   ANEMIA, IRON DEFICIENCY    Cerebrospinal fluid leak from spinal puncture    improved with 7th patch at [facility] 05/2014   Depression    therapy   Fibromyalgia 1999   no meds   GASTRITIS 11/12/2009   Qualifier: Diagnosis of  By: Elden RN, Holli     GERD (gastroesophageal reflux disease)    per endo. - does not take any meds.     Kidney stones 1998, 2005   x 2   MIGRAINE HEADACHE    last Migraine 06/29/2011-    Unspecified hypothyroidism 1982/2000   surgical resection of benign growth - partial then complete   VITAMIN B12 DEFICIENCY 09/2009 dx     Allergies  Allergen Reactions   Compazine Anaphylaxis    Hypotension   Metoclopramide Hcl Anaphylaxis    Hypotension   Prochlorperazine Edisylate     Other reaction(s): Other (See Comments) Hemodynamic instability   Gluten Meal Other (See Comments)    Vitamin B irregulaties   Buspirone      nausea   Prozac  [Fluoxetine  Hcl]     Increased anxiety   Tramadol  Nausea Only   Morphine  And Codeine Nausea Only     Current Outpatient Medications  Medication Sig Dispense Refill   albuterol  (VENTOLIN  HFA) 108 (90 Base) MCG/ACT inhaler Inhale 2  puffs into the lungs every 6 (six) hours as needed for wheezing or shortness of breath. 6.7 g 5   aspirin  EC 81 MG tablet Take 1 tablet (81 mg total) by mouth daily. Swallow whole. 90 tablet 1   atorvastatin  (LIPITOR) 20 MG tablet Take 1 tablet (20 mg total) by mouth daily. 90 tablet 3   levothyroxine  (SYNTHROID ) 112 MCG tablet Take 1 tablet (112 mcg total) by mouth daily. 90 tablet 3   methocarbamol  (ROBAXIN ) 500 MG tablet Take 500 mg by mouth 3 (three) times daily as needed.     metoprolol  succinate (TOPROL -XL) 25 MG 24 hr tablet Take 0.5 tablets (12.5 mg total) by mouth daily. 15 tablet 0   nortriptyline  (PAMELOR ) 50 MG capsule TAKE 2 CAPSULES (100 MG TOTAL) BY MOUTH AT BEDTIME. 180 capsule 3   topiramate  (TOPAMAX ) 50 MG tablet TAKE 1 TABLET BY MOUTH EVERYDAY AT BEDTIME 90 tablet 3   No current facility-administered medications for this visit.     Past Surgical History:  Procedure Laterality Date   CHOLECYSTECTOMY  2009   DIAGNOSTIC LAPAROSCOPY  1982   diagnostic   POSTERIOR FUSION LUMBAR SPINE  06/2011   THYROID  SURGERY  1983  partial    TOTAL THYROIDECTOMY  2000   Had thyroid  nodule, nonmalignant ([id number]) resection   TUBAL LIGATION  1989     Allergies  Allergen Reactions   Compazine Anaphylaxis    Hypotension   Metoclopramide Hcl Anaphylaxis    Hypotension   Prochlorperazine Edisylate     Other reaction(s): Other (See Comments) Hemodynamic instability   Gluten Meal Other (See Comments)    Vitamin B irregulaties   Buspirone      nausea   Prozac  [Fluoxetine  Hcl]     Increased anxiety   Tramadol  Nausea Only   Morphine  And Codeine Nausea Only      Family History  Problem Relation Age of Onset   Hyperlipidemia Mother    Heart disease Mother 3       massive MI   Hyperlipidemia Father    COPD Father        severe, smoker   Breast cancer Maternal Grandmother    Hyperlipidemia Maternal Grandmother    Coronary artery disease Maternal Grandmother 14       CABG    Ovarian cancer Other    Anesthesia problems Neg Hx    Malignant hyperthermia Neg Hx    Pseudochol deficiency Neg Hx    Hypotension Neg Hx      Social History Ms. Mckellips reports that she has never smoked. She has never used smokeless tobacco. Ms. Nagele reports no history of alcohol use.    Physical Examination Today's Vitals   04/12/24 0813  BP: 110/82  Pulse: 96  SpO2: 98%  Weight: 143 lb 9.6 oz (65.1 kg)  Height: 5' 3 (1.6 m)   Body mass index is 25.44 kg/m.  Gen: resting comfortably, no acute distress HEENT: no scleral icterus, pupils equal round and reactive, no palptable cervical adenopathy,  CV: RRR, no m/rg, no jvd Resp: Clear to auscultation bilaterally GI: abdomen is soft, non-tender, non-distended, normal bowel sounds, no hepatosplenomegaly MSK: extremities are warm, no edema.  Skin: warm, no rash Neuro:  no focal deficits Psych: appropriate affect   Diagnostic Studies Jan 2021 monitor 7 day event monitor Min HR 73, Max HR 142, Avg HR 98 No symptoms reported Telemetry tracings show sinus rhythm and sinus tachycardia, isolated PVC No significant arrhythmias     08/2019 echo: LVEF 60-65%, indet DDX, mild MR,    Assessment and Plan   1. Palpitations/Chest pain/SOB - symptoms started after a prior COVID infection - overall benign workup with heart monitor, echo - SOB improved with prn albuterol , palpitations overall controlled with low dose toprol  - she continues to do well with just low dose toprol , we will continue therapy.  - EKG today shows NSR   2. Coronary calcification - continue ASA, statin - continue risk factor modification - LDL goal < 100, she is well controlled  F/u 1 year   Dorn PHEBE Ross, M.D.

## 2024-04-30 ENCOUNTER — Encounter (INDEPENDENT_AMBULATORY_CARE_PROVIDER_SITE_OTHER): Payer: Self-pay | Admitting: Internal Medicine

## 2024-04-30 DIAGNOSIS — E039 Hypothyroidism, unspecified: Secondary | ICD-10-CM

## 2024-05-03 ENCOUNTER — Other Ambulatory Visit: Payer: Self-pay | Admitting: Obstetrics and Gynecology

## 2024-05-03 DIAGNOSIS — R928 Other abnormal and inconclusive findings on diagnostic imaging of breast: Secondary | ICD-10-CM

## 2024-05-05 DIAGNOSIS — E039 Hypothyroidism, unspecified: Secondary | ICD-10-CM | POA: Diagnosis not present

## 2024-05-05 MED ORDER — THYROID 60 MG PO TABS: 60.0000 mg | ORAL_TABLET | Freq: Every day | ORAL | 5 refills | Status: DC

## 2024-05-05 NOTE — Telephone Encounter (Signed)

## 2024-05-11 ENCOUNTER — Ambulatory Visit
Admission: RE | Admit: 2024-05-11 | Discharge: 2024-05-11 | Disposition: A | Discharge status: Home / Self Care | Source: Ambulatory Visit | Attending: Obstetrics and Gynecology | Admitting: Obstetrics and Gynecology

## 2024-05-11 ENCOUNTER — Other Ambulatory Visit: Payer: Self-pay | Admitting: Obstetrics and Gynecology

## 2024-05-11 ENCOUNTER — Ambulatory Visit
Admission: RE | Admit: 2024-05-11 | Discharge: 2024-05-11 | Disposition: A | Source: Ambulatory Visit | Attending: Obstetrics and Gynecology | Admitting: Obstetrics and Gynecology

## 2024-05-11 DIAGNOSIS — R928 Other abnormal and inconclusive findings on diagnostic imaging of breast: Secondary | ICD-10-CM

## 2024-05-11 DIAGNOSIS — R599 Enlarged lymph nodes, unspecified: Secondary | ICD-10-CM

## 2024-07-22 ENCOUNTER — Other Ambulatory Visit: Payer: Self-pay | Admitting: Neurology

## 2024-07-25 ENCOUNTER — Other Ambulatory Visit (INDEPENDENT_AMBULATORY_CARE_PROVIDER_SITE_OTHER)

## 2024-07-25 ENCOUNTER — Ambulatory Visit: Payer: 59 | Admitting: Neurology

## 2024-07-25 ENCOUNTER — Encounter: Payer: Self-pay | Admitting: Internal Medicine

## 2024-07-25 DIAGNOSIS — E039 Hypothyroidism, unspecified: Secondary | ICD-10-CM

## 2024-07-25 LAB — TSH: TSH: 164.17 u[IU]/mL — ABNORMAL HIGH (ref 0.35–5.50)

## 2024-07-25 MED ORDER — THYROID 60 MG PO TABS: 120.0000 mg | ORAL_TABLET | Freq: Every day | ORAL | 1 refills | Status: AC

## 2024-07-29 NOTE — Progress Notes (Unsigned)
 "  NEUROLOGY FOLLOW UP OFFICE NOTE  Melissa Kirby [id number]  Assessment/Plan:   Primary stabbing headache Tension-type headache, cervicogenic History of spontaneous intracranial hypotension   Topiramate  50mg  at bedtime  If tension headaches do not continue to improve, I will refer her to physical therapy for cervicalgia Follow up in one year      Subjective:  Melissa Kirby is a 64 year old right-handed woman with history of migraine, B12 deficiency, hypothyroidism, kidney stones, depression, and history of idiopathic intracranial hypotension and lumbar laminectomy and fusion who follows up for headache.  UPDATE: No severe migraines.  She may get a mild occipital tension type headache once a week, with some neck pain.  Now that she has retired, she is hoping those will decrease in frequency as well.  I asked that since headaches have been well controlled and she is now retired, if she would like to taper off topiramate . She declines for now.   Current NSAIDS/analgesics:  none Current triptans:  none Current ergotamine:  none Current anti-emetic:  none Current muscle relaxants:  none Current Antihypertensive medications:  none Current Antidepressant medications:  nortriptyline  100mg  QHS, sertraline  50mg  daily Current Anticonvulsant medications:  topiramate  50mg  QHS Current anti-CGRP:  none Current Vitamins/Herbal/Supplements:  none Current Antihistamines/Decongestants:  none Other therapy:  none Hormone/birth control:  none Other medications:  Synthroid      HISTORY: In January-February 2015, she developed a stabbing headache several times daily.  Valsalva, bending forward, coughing and sneezing would make it worse.  Laying supine helped.  MRI of the brain was performed on 10/13/13, which revealed inferior displacement of the cerebellum and brainstem, with flattening of the ventral pons and diminished suprasellar cistern volume, as well as mild circumferential dural thickening,  findings which may be seen in intracranial hypotension.  She underwent lumbar puncture with opening pressure of 11 and closing pressure less than 9.  CSF cell count 6, glucose 58, protein 35, gram stain and culture negative.  MRI of the cervical, thoracic and lumbar spines with and without contrast on 11/23/13 did not reveal any post-contrast signal abnormality or location for CSF leak.  CT myelogram revealed possible epidural leak on the right at T7-T8.  She underwent a blood patch on 01/13/14, and she was headache-free for almost 3 months but then the pain recurred.  She was referred to radiology at [facility] for evaluation and possible intervention.  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   She underwent gluing.  Repeat MRI of brain on 08/24/2015 was essentially normal for age without evidence of sagging of the brainstem or abnormal dural enhancement to suggest recurrent intracranial hypotension.   She had been doing well for quite some time.  In 2022, she started experiencing similar paroxysmal stabbing headaches in the front, lasting a couple of seconds.  During that moment, she has blackout of vision.  Initially they occurred once every couple of weeks, now at least once a week.  She also has significant tenderness to the left temple.  The back of head feels sore and tender to touch.  She hears her heartbeat in her head.  Coughing aggravates the frontal pain.  Headache is slightly better when laying flat on the bed without a pillow.  When she first gets up in the morning, she feels weird and nauseous.  She has had an eye exam that was reportedly unremarkable.  Workup for headache included labs on 07/17/2021 which revealed sed rate 43 and negative CRP.  MRI of brain with and without contrast on 08/04/2021 personally reviewed revealed nonspecific mild cerebral white matter signal  changes likely chronic small vessel ischemic changes.    Started topiramate .     Migraines are well-controlled, particularly since starting a gluten-free diet.    Past NSAIDS/analgesics:  Fioricet, Mobic , tramadol  Past abortive triptans:  none Past abortive ergotamine:  none Past muscle relaxants:  Robaxin , Skelaxin Past anti-emetic:  Zofran  Past antihypertensive medications:  lisinopril  Past antidepressant medications:  venlafaxine , escitalopram , fluoxetine  Past anticonvulsant medications:  gabapentin  Past anti-CGRP:  none Past vitamins/Herbal/Supplements:  none Past antihistamines/decongestants:  Benadryl  Other past therapies:  none  PAST MEDICAL HISTORY: Past Medical History:  Diagnosis Date   ANEMIA, IRON DEFICIENCY    Cerebrospinal fluid leak from spinal puncture    improved with 7th patch at [facility] 05/2014   Depression    therapy   Fibromyalgia 1999   no meds   GASTRITIS 11/12/2009   Qualifier: Diagnosis of  By: Elden RN, Holli     GERD (gastroesophageal reflux disease)    per endo. - does not take any meds.     Kidney stones 1998, 2005   x 2   MIGRAINE HEADACHE    last Migraine 06/29/2011-    Unspecified hypothyroidism 1982/2000   surgical resection of benign growth - partial then complete   VITAMIN B12 DEFICIENCY 09/2009 dx    MEDICATIONS: Current Outpatient Medications on File Prior to Visit  Medication Sig Dispense Refill   albuterol  (VENTOLIN  HFA) 108 (90 Base) MCG/ACT inhaler Inhale 2 puffs into the lungs every 6 (six) hours as needed for wheezing or shortness of breath. 6.7 g 5   aspirin  EC 81 MG tablet Take 1 tablet (81 mg total) by mouth daily. Swallow whole. 90 tablet 1   atorvastatin  (LIPITOR) 20 MG tablet Take 1 tablet (20 mg total) by mouth daily. 90 tablet 3   methocarbamol  (ROBAXIN ) 500 MG tablet Take 500 mg by mouth 3 (three) times daily as needed.     metoprolol  succinate (TOPROL -XL) 25 MG 24 hr tablet Take 0.5 tablets (12.5 mg total) by mouth  daily. 45 tablet 3   nortriptyline  (PAMELOR ) 50 MG capsule TAKE 2 CAPSULES (100 MG TOTAL) BY MOUTH AT BEDTIME. 180 capsule 3   thyroid  (NP THYROID ) 60 MG tablet Take 2 tablets (120 mg total) by mouth daily before breakfast. 60 tablet 1   topiramate  (TOPAMAX ) 50 MG tablet TAKE 1 TABLET BY MOUTH EVERYDAY AT BEDTIME 90 tablet 0   No current facility-administered medications on file prior to visit.    ALLERGIES: Allergies  Allergen Reactions   Compazine Anaphylaxis    Hypotension   Metoclopramide Hcl Anaphylaxis    Hypotension   Prochlorperazine Edisylate     Other reaction(s): Other (See Comments) Hemodynamic instability   Gluten Meal Other (See Comments)    Vitamin B irregulaties   Buspirone      nausea   Prozac  [Fluoxetine  Hcl]     Increased anxiety   Tramadol  Nausea Only   Morphine  And Codeine Nausea Only    FAMILY HISTORY: Family History  Problem Relation Age of Onset   Hyperlipidemia Mother    Heart disease Mother 84       massive MI   Hyperlipidemia Father    COPD Father        severe, smoker   Breast cancer Maternal Grandmother    Hyperlipidemia Maternal Grandmother  Coronary artery disease Maternal Grandmother 78       CABG   Ovarian cancer Other    Anesthesia problems Neg Hx    Malignant hyperthermia Neg Hx    Pseudochol deficiency Neg Hx    Hypotension Neg Hx       Objective:  Blood pressure 123/79, pulse 100, height 5' 3 (1.6 m), weight 149 lb (67.6 kg), SpO2 100%. General: No acute distress.  Patient appears well-groomed.   Head:  Normocephalic/atraumatic Neck:  Supple.  No paraspinal tenderness.  Full range of motion. Heart:  Regular rate and rhythm. Neuro:  Alert and oriented.  Speech fluent and not dysarthric.  Language intact.  CN II-XII intact.  Bulk and tone normal.  Muscle strength 5/5 throughout.  Sensation to light touch intact.  Deep tendon reflexes 2+ throughout, toes downgoing.  Gait normal.  Romberg negative.    Juliene Dunnings, DO  CC:  Melissa Hope, MD       "

## 2024-08-01 ENCOUNTER — Ambulatory Visit: Admitting: Neurology

## 2024-08-01 ENCOUNTER — Encounter: Payer: Self-pay | Admitting: Neurology

## 2024-08-01 VITALS — BP 123/79 | HR 100 | Ht 63.0 in | Wt 149.0 lb

## 2024-08-01 DIAGNOSIS — G4485 Primary stabbing headache: Secondary | ICD-10-CM

## 2024-08-01 MED ORDER — TOPIRAMATE 50 MG PO TABS: ORAL_TABLET | ORAL | 3 refills | Status: AC

## 2024-08-11 ENCOUNTER — Ambulatory Visit
Admission: RE | Admit: 2024-08-11 | Discharge: 2024-08-11 | Disposition: A | Discharge status: Home / Self Care | Source: Ambulatory Visit | Attending: Obstetrics and Gynecology | Admitting: Obstetrics and Gynecology

## 2024-08-11 DIAGNOSIS — R599 Enlarged lymph nodes, unspecified: Secondary | ICD-10-CM

## 2024-08-24 ENCOUNTER — Other Ambulatory Visit

## 2024-08-24 DIAGNOSIS — E039 Hypothyroidism, unspecified: Secondary | ICD-10-CM | POA: Diagnosis not present

## 2024-08-24 LAB — T3, FREE: T3, Free: 2.5 pg/mL (ref 2.3–4.2)

## 2024-08-24 LAB — TSH: TSH: 24.41 u[IU]/mL — ABNORMAL HIGH (ref 0.35–5.50)

## 2024-08-24 LAB — T4, FREE: Free T4: 0.42 ng/dL — ABNORMAL LOW (ref 0.60–1.60)

## 2025-08-01 ENCOUNTER — Ambulatory Visit: Payer: Self-pay | Admitting: Neurology
